# Patient Record
Sex: Male | Born: 1940 | Race: Asian | Hispanic: No | Smoking: Never smoker
Health system: Southern US, Community
[De-identification: ages and names within clinical notes are randomized; demographics above are authoritative.]

## PROBLEM LIST (undated history)

## (undated) DIAGNOSIS — N189 Chronic kidney disease, unspecified: Secondary | ICD-10-CM

## (undated) DIAGNOSIS — D649 Anemia, unspecified: Secondary | ICD-10-CM

## (undated) DIAGNOSIS — G709 Myoneural disorder, unspecified: Secondary | ICD-10-CM

## (undated) DIAGNOSIS — T7840XA Allergy, unspecified, initial encounter: Secondary | ICD-10-CM

## (undated) DIAGNOSIS — K635 Polyp of colon: Secondary | ICD-10-CM

## (undated) DIAGNOSIS — R600 Localized edema: Secondary | ICD-10-CM

## (undated) DIAGNOSIS — R569 Unspecified convulsions: Secondary | ICD-10-CM

## (undated) DIAGNOSIS — K602 Anal fissure, unspecified: Secondary | ICD-10-CM

## (undated) DIAGNOSIS — K649 Unspecified hemorrhoids: Secondary | ICD-10-CM

## (undated) DIAGNOSIS — G4730 Sleep apnea, unspecified: Secondary | ICD-10-CM

## (undated) DIAGNOSIS — R32 Unspecified urinary incontinence: Secondary | ICD-10-CM

## (undated) DIAGNOSIS — K589 Irritable bowel syndrome without diarrhea: Secondary | ICD-10-CM

## (undated) DIAGNOSIS — R079 Chest pain, unspecified: Secondary | ICD-10-CM

## (undated) DIAGNOSIS — G8929 Other chronic pain: Secondary | ICD-10-CM

## (undated) DIAGNOSIS — I10 Essential (primary) hypertension: Secondary | ICD-10-CM

## (undated) DIAGNOSIS — G4733 Obstructive sleep apnea (adult) (pediatric): Secondary | ICD-10-CM

## (undated) DIAGNOSIS — R51 Headache: Secondary | ICD-10-CM

## (undated) DIAGNOSIS — F419 Anxiety disorder, unspecified: Secondary | ICD-10-CM

## (undated) DIAGNOSIS — E785 Hyperlipidemia, unspecified: Secondary | ICD-10-CM

## (undated) DIAGNOSIS — N400 Enlarged prostate without lower urinary tract symptoms: Secondary | ICD-10-CM

## (undated) DIAGNOSIS — E119 Type 2 diabetes mellitus without complications: Secondary | ICD-10-CM

## (undated) DIAGNOSIS — G43909 Migraine, unspecified, not intractable, without status migrainosus: Secondary | ICD-10-CM

## (undated) DIAGNOSIS — H269 Unspecified cataract: Secondary | ICD-10-CM

## (undated) DIAGNOSIS — I4510 Unspecified right bundle-branch block: Secondary | ICD-10-CM

## (undated) DIAGNOSIS — M1990 Unspecified osteoarthritis, unspecified site: Secondary | ICD-10-CM

## (undated) DIAGNOSIS — K219 Gastro-esophageal reflux disease without esophagitis: Secondary | ICD-10-CM

## (undated) HISTORY — PX: HEMORRHOID SURGERY: SHX153

## (undated) HISTORY — DX: Unspecified convulsions: R56.9

## (undated) HISTORY — DX: Polyp of colon: K63.5

## (undated) HISTORY — DX: Allergy, unspecified, initial encounter: T78.40XA

## (undated) HISTORY — DX: Irritable bowel syndrome, unspecified: K58.9

## (undated) HISTORY — DX: Sleep apnea, unspecified: G47.30

## (undated) HISTORY — DX: Anemia, unspecified: D64.9

## (undated) HISTORY — DX: Chronic kidney disease, unspecified: N18.9

## (undated) HISTORY — DX: Benign prostatic hyperplasia without lower urinary tract symptoms: N40.0

## (undated) HISTORY — DX: Anal fissure, unspecified: K60.2

## (undated) HISTORY — PX: CATARACT EXTRACTION: SUR2

## (undated) HISTORY — DX: Unspecified osteoarthritis, unspecified site: M19.90

## (undated) HISTORY — PX: POLYPECTOMY: SHX149

## (undated) HISTORY — DX: Migraine, unspecified, not intractable, without status migrainosus: G43.909

## (undated) HISTORY — DX: Unspecified cataract: H26.9

## (undated) HISTORY — DX: Unspecified hemorrhoids: K64.9

## (undated) HISTORY — DX: Other chronic pain: G89.29

---

## 2001-06-17 ENCOUNTER — Encounter: Payer: Self-pay | Admitting: Cardiology

## 2001-06-17 ENCOUNTER — Ambulatory Visit (HOSPITAL_COMMUNITY): Admission: RE | Admit: 2001-06-17 | Discharge: 2001-06-17 | Payer: Self-pay | Admitting: Cardiology

## 2001-06-27 ENCOUNTER — Encounter: Payer: Self-pay | Admitting: Cardiology

## 2001-06-27 ENCOUNTER — Ambulatory Visit (HOSPITAL_COMMUNITY): Admission: RE | Admit: 2001-06-27 | Discharge: 2001-06-27 | Payer: Self-pay | Admitting: Cardiology

## 2001-06-29 ENCOUNTER — Encounter (INDEPENDENT_AMBULATORY_CARE_PROVIDER_SITE_OTHER): Payer: Self-pay | Admitting: *Deleted

## 2001-06-29 ENCOUNTER — Emergency Department (HOSPITAL_COMMUNITY): Admission: EM | Admit: 2001-06-29 | Discharge: 2001-06-29 | Payer: Self-pay | Admitting: *Deleted

## 2001-08-29 ENCOUNTER — Encounter (INDEPENDENT_AMBULATORY_CARE_PROVIDER_SITE_OTHER): Payer: Self-pay | Admitting: *Deleted

## 2001-08-29 ENCOUNTER — Ambulatory Visit (HOSPITAL_COMMUNITY): Admission: RE | Admit: 2001-08-29 | Discharge: 2001-08-29 | Payer: Self-pay | Admitting: Gastroenterology

## 2002-06-19 ENCOUNTER — Ambulatory Visit (HOSPITAL_COMMUNITY): Admission: RE | Admit: 2002-06-19 | Discharge: 2002-06-19 | Payer: Self-pay | Admitting: Internal Medicine

## 2002-06-19 ENCOUNTER — Encounter: Payer: Self-pay | Admitting: Internal Medicine

## 2002-06-19 ENCOUNTER — Encounter (INDEPENDENT_AMBULATORY_CARE_PROVIDER_SITE_OTHER): Payer: Self-pay | Admitting: *Deleted

## 2004-06-21 ENCOUNTER — Encounter (INDEPENDENT_AMBULATORY_CARE_PROVIDER_SITE_OTHER): Payer: Self-pay | Admitting: *Deleted

## 2004-06-21 ENCOUNTER — Ambulatory Visit (HOSPITAL_COMMUNITY): Admission: RE | Admit: 2004-06-21 | Discharge: 2004-06-21 | Payer: Self-pay | Admitting: Internal Medicine

## 2004-07-22 ENCOUNTER — Ambulatory Visit: Payer: Self-pay | Admitting: Family Medicine

## 2004-08-18 ENCOUNTER — Ambulatory Visit: Payer: Self-pay | Admitting: Family Medicine

## 2004-10-27 ENCOUNTER — Ambulatory Visit: Payer: Self-pay | Admitting: *Deleted

## 2004-11-07 ENCOUNTER — Ambulatory Visit: Payer: Self-pay | Admitting: Family Medicine

## 2005-02-28 ENCOUNTER — Ambulatory Visit: Payer: Self-pay | Admitting: Family Medicine

## 2005-03-07 ENCOUNTER — Ambulatory Visit: Payer: Self-pay | Admitting: Family Medicine

## 2005-03-08 ENCOUNTER — Encounter (INDEPENDENT_AMBULATORY_CARE_PROVIDER_SITE_OTHER): Payer: Self-pay | Admitting: *Deleted

## 2005-03-08 ENCOUNTER — Ambulatory Visit (HOSPITAL_COMMUNITY): Admission: RE | Admit: 2005-03-08 | Discharge: 2005-03-08 | Payer: Self-pay | Admitting: Internal Medicine

## 2005-03-14 ENCOUNTER — Ambulatory Visit: Payer: Self-pay | Admitting: Family Medicine

## 2005-08-03 ENCOUNTER — Ambulatory Visit: Payer: Self-pay | Admitting: Family Medicine

## 2005-08-29 ENCOUNTER — Ambulatory Visit: Payer: Self-pay | Admitting: Family Medicine

## 2005-09-11 ENCOUNTER — Ambulatory Visit: Payer: Self-pay | Admitting: Family Medicine

## 2005-09-18 ENCOUNTER — Ambulatory Visit: Payer: Self-pay | Admitting: Family Medicine

## 2006-01-26 ENCOUNTER — Ambulatory Visit: Payer: Self-pay | Admitting: Family Medicine

## 2006-02-21 ENCOUNTER — Ambulatory Visit: Payer: Self-pay | Admitting: Family Medicine

## 2006-03-13 ENCOUNTER — Ambulatory Visit: Payer: Self-pay | Admitting: Internal Medicine

## 2006-03-13 ENCOUNTER — Encounter: Payer: Self-pay | Admitting: Internal Medicine

## 2006-08-14 ENCOUNTER — Ambulatory Visit: Payer: Self-pay | Admitting: Family Medicine

## 2006-09-24 ENCOUNTER — Ambulatory Visit: Payer: Self-pay | Admitting: Family Medicine

## 2006-10-30 HISTORY — PX: CHOLECYSTECTOMY: SHX55

## 2006-11-20 ENCOUNTER — Ambulatory Visit: Payer: Self-pay | Admitting: Family Medicine

## 2007-04-10 DIAGNOSIS — E1159 Type 2 diabetes mellitus with other circulatory complications: Secondary | ICD-10-CM | POA: Insufficient documentation

## 2007-04-10 DIAGNOSIS — G479 Sleep disorder, unspecified: Secondary | ICD-10-CM | POA: Insufficient documentation

## 2007-04-10 DIAGNOSIS — J302 Other seasonal allergic rhinitis: Secondary | ICD-10-CM | POA: Insufficient documentation

## 2007-04-10 DIAGNOSIS — K2990 Gastroduodenitis, unspecified, without bleeding: Secondary | ICD-10-CM

## 2007-04-10 DIAGNOSIS — E785 Hyperlipidemia, unspecified: Secondary | ICD-10-CM | POA: Insufficient documentation

## 2007-04-10 DIAGNOSIS — I10 Essential (primary) hypertension: Secondary | ICD-10-CM | POA: Insufficient documentation

## 2007-04-10 DIAGNOSIS — T7840XA Allergy, unspecified, initial encounter: Secondary | ICD-10-CM | POA: Insufficient documentation

## 2007-04-10 DIAGNOSIS — K2970 Gastritis, unspecified, without bleeding: Secondary | ICD-10-CM | POA: Insufficient documentation

## 2007-04-10 DIAGNOSIS — K449 Diaphragmatic hernia without obstruction or gangrene: Secondary | ICD-10-CM | POA: Insufficient documentation

## 2007-04-10 DIAGNOSIS — K649 Unspecified hemorrhoids: Secondary | ICD-10-CM | POA: Insufficient documentation

## 2007-04-20 ENCOUNTER — Encounter (INDEPENDENT_AMBULATORY_CARE_PROVIDER_SITE_OTHER): Payer: Self-pay | Admitting: *Deleted

## 2007-04-20 ENCOUNTER — Emergency Department (HOSPITAL_COMMUNITY): Admission: EM | Admit: 2007-04-20 | Discharge: 2007-04-20 | Payer: Self-pay | Admitting: Emergency Medicine

## 2007-05-27 ENCOUNTER — Encounter (INDEPENDENT_AMBULATORY_CARE_PROVIDER_SITE_OTHER): Payer: Self-pay | Admitting: *Deleted

## 2007-05-27 ENCOUNTER — Encounter (INDEPENDENT_AMBULATORY_CARE_PROVIDER_SITE_OTHER): Payer: Self-pay | Admitting: Gastroenterology

## 2007-05-27 ENCOUNTER — Ambulatory Visit (HOSPITAL_COMMUNITY): Admission: RE | Admit: 2007-05-27 | Discharge: 2007-05-27 | Payer: Self-pay | Admitting: Gastroenterology

## 2007-07-18 ENCOUNTER — Encounter (INDEPENDENT_AMBULATORY_CARE_PROVIDER_SITE_OTHER): Payer: Self-pay | Admitting: *Deleted

## 2007-07-18 ENCOUNTER — Ambulatory Visit (HOSPITAL_COMMUNITY): Admission: RE | Admit: 2007-07-18 | Discharge: 2007-07-19 | Payer: Self-pay | Admitting: General Surgery

## 2007-07-19 ENCOUNTER — Encounter (INDEPENDENT_AMBULATORY_CARE_PROVIDER_SITE_OTHER): Payer: Self-pay | Admitting: General Surgery

## 2007-07-19 ENCOUNTER — Encounter (INDEPENDENT_AMBULATORY_CARE_PROVIDER_SITE_OTHER): Payer: Self-pay | Admitting: *Deleted

## 2008-07-01 ENCOUNTER — Emergency Department (HOSPITAL_COMMUNITY): Admission: EM | Admit: 2008-07-01 | Discharge: 2008-07-01 | Payer: Self-pay | Admitting: Emergency Medicine

## 2008-10-06 ENCOUNTER — Emergency Department (HOSPITAL_COMMUNITY): Admission: EM | Admit: 2008-10-06 | Discharge: 2008-10-06 | Payer: Self-pay | Admitting: Emergency Medicine

## 2009-05-27 HISTORY — PX: NM MYOCAR PERF WALL MOTION: HXRAD629

## 2009-05-27 HISTORY — PX: US ECHOCARDIOGRAPHY: HXRAD669

## 2009-05-31 ENCOUNTER — Encounter: Admission: RE | Admit: 2009-05-31 | Discharge: 2009-05-31 | Payer: Self-pay | Admitting: Cardiology

## 2009-06-04 ENCOUNTER — Ambulatory Visit (HOSPITAL_COMMUNITY): Admission: RE | Admit: 2009-06-04 | Discharge: 2009-06-04 | Payer: Self-pay | Admitting: Cardiology

## 2009-06-04 HISTORY — PX: CARDIAC CATHETERIZATION: SHX172

## 2009-06-08 ENCOUNTER — Emergency Department (HOSPITAL_COMMUNITY): Admission: EM | Admit: 2009-06-08 | Discharge: 2009-06-08 | Payer: Self-pay | Admitting: Emergency Medicine

## 2009-08-11 ENCOUNTER — Ambulatory Visit (HOSPITAL_COMMUNITY): Admission: RE | Admit: 2009-08-11 | Discharge: 2009-08-11 | Payer: Self-pay | Admitting: Cardiology

## 2009-11-30 DIAGNOSIS — G4733 Obstructive sleep apnea (adult) (pediatric): Secondary | ICD-10-CM

## 2009-11-30 HISTORY — DX: Obstructive sleep apnea (adult) (pediatric): G47.33

## 2009-12-26 ENCOUNTER — Ambulatory Visit (HOSPITAL_BASED_OUTPATIENT_CLINIC_OR_DEPARTMENT_OTHER): Admission: RE | Admit: 2009-12-26 | Discharge: 2009-12-26 | Payer: Self-pay | Admitting: Specialist

## 2010-01-01 ENCOUNTER — Ambulatory Visit: Payer: Self-pay | Admitting: Internal Medicine

## 2010-01-18 ENCOUNTER — Emergency Department (HOSPITAL_COMMUNITY): Admission: EM | Admit: 2010-01-18 | Discharge: 2010-01-19 | Payer: Self-pay | Admitting: Emergency Medicine

## 2010-03-02 ENCOUNTER — Emergency Department (HOSPITAL_COMMUNITY): Admission: EM | Admit: 2010-03-02 | Discharge: 2010-03-03 | Payer: Self-pay | Admitting: Emergency Medicine

## 2010-03-16 ENCOUNTER — Encounter (INDEPENDENT_AMBULATORY_CARE_PROVIDER_SITE_OTHER): Payer: Self-pay | Admitting: *Deleted

## 2010-03-16 ENCOUNTER — Emergency Department (HOSPITAL_COMMUNITY): Admission: EM | Admit: 2010-03-16 | Discharge: 2010-03-17 | Payer: Self-pay | Admitting: Emergency Medicine

## 2010-04-15 ENCOUNTER — Encounter (INDEPENDENT_AMBULATORY_CARE_PROVIDER_SITE_OTHER): Payer: Self-pay | Admitting: *Deleted

## 2010-05-19 ENCOUNTER — Ambulatory Visit: Payer: Self-pay | Admitting: Internal Medicine

## 2010-05-19 DIAGNOSIS — K589 Irritable bowel syndrome without diarrhea: Secondary | ICD-10-CM | POA: Insufficient documentation

## 2010-05-19 DIAGNOSIS — Z860100 Personal history of colon polyps, unspecified: Secondary | ICD-10-CM | POA: Insufficient documentation

## 2010-05-19 DIAGNOSIS — K581 Irritable bowel syndrome with constipation: Secondary | ICD-10-CM | POA: Insufficient documentation

## 2010-05-19 DIAGNOSIS — K219 Gastro-esophageal reflux disease without esophagitis: Secondary | ICD-10-CM | POA: Insufficient documentation

## 2010-06-20 ENCOUNTER — Ambulatory Visit: Payer: Self-pay | Admitting: Internal Medicine

## 2010-06-21 ENCOUNTER — Telehealth: Payer: Self-pay | Admitting: Internal Medicine

## 2010-07-26 ENCOUNTER — Emergency Department (HOSPITAL_COMMUNITY): Admission: EM | Admit: 2010-07-26 | Discharge: 2010-07-26 | Payer: Self-pay | Admitting: Emergency Medicine

## 2010-10-30 HISTORY — PX: COLONOSCOPY: SHX174

## 2010-11-20 ENCOUNTER — Encounter: Payer: Self-pay | Admitting: Emergency Medicine

## 2010-11-29 NOTE — Letter (Signed)
Summary: EGD Instructions  [facility]  [address] [place], [place] [address]   Phone: [phone]  Fax: [phone]       Omar Sanders    27-Jul-1941    MRN: [id number]       Procedure Day /Date:MONDAY, 06/20/10     Arrival Time: 3:00 PM     Procedure Time:4:00 PM     Location of Procedure:                    [facility] (4th Floor)   PREPARATION FOR ENDOSCOPY   On MONDAY, 8/22 THE DAY OF THE PROCEDURE:  1.   No solid foods, milk or milk products are allowed after midnight the night before your procedure.  2.   Do not drink anything colored red or purple.  Avoid juices with pulp.  No orange juice.  3.  You may drink clear liquids until 2:00 PM, which is 2 hours before your procedure.                                                                                                CLEAR LIQUIDS INCLUDE: Water Jello Ice Popsicles Tea (sugar ok, no milk/cream) Powdered fruit flavored drinks Coffee (sugar ok, no milk/cream) Gatorade Juice: apple, white grape, white cranberry  Lemonade Clear bullion, consomm, broth Carbonated beverages (any kind) Strained chicken noodle soup Hard Candy   MEDICATION INSTRUCTIONS  Unless otherwise instructed, you should take regular prescription medications with a small sip of water as early as possible the morning of your procedure.             OTHER INSTRUCTIONS  You will need a responsible adult at least 72 years of age to accompany you and drive you home.   This person must remain in the waiting room during your procedure.  Wear loose fitting clothing that is easily removed.  Leave jewelry and other valuables at home.  However, you may wish to bring a book to read or an iPod/MP3 player to listen to music as you wait for your procedure to start.  Remove all body piercing jewelry and leave at home.  Total time from sign-in until discharge is approximately 2-3 hours.  You should go home  directly after your procedure and rest.  You can resume normal activities the day after your procedure.  The day of your procedure you should not:   Drive   Make legal decisions   Operate machinery   Drink alcohol   Return to work  You will receive specific instructions about eating, activities and medications before you leave.    The above instructions have been reviewed and explained to me by   _______________________    I fully understand and can verbalize these instructions _____________________________ Date _________

## 2010-11-29 NOTE — Procedures (Signed)
Summary: Upper Endoscopy  Patient: Omar Sanders Note: All result statuses are Final unless otherwise noted.  Tests: (1) Upper Endoscopy (EGD)   EGD Upper Endoscopy       DONE     [facility]     [address]     [place], [place]  [address]           ENDOSCOPY PROCEDURE REPORT           PATIENT:  Omar, Sanders  MR#:  [id number]     BIRTHDATE:  1941/06/21, 69 yrs. old  GENDER:  male           ENDOSCOPIST:  Wilhemina Bonito. Eda Keys, MD     Referred by:  Office (ER)           PROCEDURE DATE:  06/20/2010     PROCEDURE:  EGD, diagnostic     ASA CLASS:  Class II     INDICATIONS:  epigastric pain           MEDICATIONS:   Fentanyl 50 mcg IV, Versed 5 mg IV     TOPICAL ANESTHETIC:  Exactacain Spray           DESCRIPTION OF PROCEDURE:   After the risks benefits and     alternatives of the procedure were thoroughly explained, informed     consent was obtained.  The [facility] GIF-H180 [id number] endoscope was     introduced through the mouth and advanced to the second portion of     the duodenum, without limitations.  The instrument was slowly     withdrawn as the mucosa was fully examined.     <<PROCEDUREIMAGES>>           The upper, middle, and distal third of the esophagus were     carefully inspected and no abnormalities were noted. The z-line     was well seen at the GEJ. The endoscope was pushed into the fundus     which was normal including a retroflexed view. The antrum,gastric     body, first and second part of the duodenum were unremarkable.     Retroflexed views revealed no abnormalities.    The scope was then     withdrawn from the patient and the procedure completed.           COMPLICATIONS:  None           ENDOSCOPIC IMPRESSION:     1) Normal EGD     2) Suspect symptoms are functional and related to anxiety           RECOMMENDATIONS:     1) RESUME YOUR CARE WITH DR DWIGHT Mayford Knife           _____________________________     Wilhemina Bonito. Eda Keys, MD           CC:  Lerry Liner, MD, The Patient           n.     eSIGNED:   Wilhemina Bonito. Eda Keys at 06/20/2010 04:23 PM           Omar Sanders, [id number]  Note: An exclamation mark (!) indicates a result that was not dispersed into the flowsheet. Document Creation Date: 06/20/2010 4:25 PM _______________________________________________________________________  (1) Order result status: Final Collection or observation date-time: 06/20/2010 16:15 Requested date-time:  Receipt date-time:  Reported date-time:  Referring Physician:   Ordering Physician: Fransico Setters [phone]) Specimen Source:  Source: Launa Grill Order Number: [id number]  Lab site:

## 2010-11-29 NOTE — Op Note (Signed)
Summary: Operative Report

## 2010-11-29 NOTE — Op Note (Signed)
Summary: Colonoscopy-Dr. Juliette Alcide [facility]  Patient:    JOHNSON, ARIZOLA Visit Number: [id number] MRN: [id number]          Service Type: END Location: ENDO Attending Physician:  Charna Elizabeth Dictated by:   Anselmo Rod, M.D. Proc. Date: 08/29/01 Admit Date:  08/29/2001   CC:         Mohan N. Sharyn Lull, M.D.                           Procedure Report  DATE OF BIRTH:  [birth year].  PROCEDURE:  Colonoscopy.  ENDOSCOPIST:  Anselmo Rod, M.D.  INSTRUMENT USED:  Pediatric Olympus colonoscope.  INDICATION FOR PROCEDURE:  Rectal bleeding in a 70 year old [place] male. Rule out colonic polyps, masses, hemorrhoids, etc.  PREPROCEDURE PREPARATION:  Informed consent was procured from the patient. The patient was fasted for eight hours prior to the procedure and prepped with a bottle of magnesium citrate and a gallon of NuLytely the night prior to the procedure.  PREPROCEDURE PHYSICAL:  VITAL SIGNS:  The patient had stable vital signs.  NECK:  Supple.  CHEST:  Clear to auscultation.  S1, S2 regular.  ABDOMEN:  Soft with normal bowel sounds.  DESCRIPTION OF PROCEDURE:  The patient was placed in the left lateral decubitus position and sedated with 60 mg of Demerol and 6 mg of Versed intravenously.  Once the patient was adequately sedate and maintained on low-flow oxygen and continuous cardiac monitoring, the Olympus video colonoscope was advanced from the rectum to the cecum without difficulty. Except for small internal hemorrhoids, no masses, polyps, erosions, ulcerations, or diverticula were seen.  IMPRESSION:  Healthy-appearing colon except for small internal hemorrhoid.  RECOMMENDATIONS: 1. A high-fiber diet has been advised. 2. Outpatient follow-up is recommended within the next four weeks. Dictated by:   Anselmo Rod, M.D. Attending Physician:  Charna Elizabeth DD:  08/29/01 TD:  08/30/01 Job: [id number]

## 2010-11-29 NOTE — Assessment & Plan Note (Signed)
Summary: Abdominal pain   History of Present Illness Visit Type: new patient Primary GI MD: Yancey Flemings MD Primary Provider: Lerry Liner M.D. Requesting Provider: [facility] Chief Complaint: pt c/o liquid dysphagia-water only, abdominal pain that begins midline, but will radiate to entire abdomen History of Present Illness:   70 year old [place] refugee with a history of hypertension, dyslipidemia, and GERD. He presents today at the request of the emergency room regarding chronic abdominal complaints. He is accompanied by his niece. Patient has a greater than 10 year history of GI complaints. His GI review of systems is essentially positive. Complaints include generalized abdominal pain, bloating, alternating bowel habits with a tendency toward diarrhea, reflux, dysphagia, loss of appetite, and weight loss. He has undergone multiple evaluations including numerous imaging studies. He has also undergone several colonoscopies and an upper endoscopy with another gastroenterologist, Dr. Loreta Ave. His not GI review of systems is entirely positive as well. He was seen in this office in 2007 with chronic complaints. 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 Since that time he has undergone cholecystectomy for gallstones and another colonoscopy with Dr. Loreta Ave and 2008 with polyps being found. These were adenomatous. Routine followup is planned with Dr. Loreta Ave per her recommendation upper endoscopy at that same time was remarkable for mild gastritis only. He has Nexium and ranitidine on his medication list. Is not clear that he is after taking Nexium however. His most recent history dates back to May 2011 when he was evaluated on the fifth day for epigastric and chest pain. He also complained of a headache, neck pain, and numbness of arms and legs. Workup included multiple laboratories which were normal, chest x-ray without acute abnormality. Negative urinalysis and cardiac markers. A negative head CT.  On the 18th day of the month he was again seen complaining of abdominal pain. Extensive workup included negative urinalysis is unremarkable laboratories as well as negative CT scan of the abdomen and pelvis. He was referred to this office. Weight in 2007 was 166 and today 144 pounds. Language barrier present.   GI Review of Systems    Reports abdominal pain, acid reflux, bloating, chest pain, dysphagia with liquids, loss of appetite, nausea, and  weight loss.     Location of  Abdominal pain: generalized.    Denies belching, dysphagia with solids, heartburn, vomiting, vomiting blood, and  weight gain.      Reports black tarry stools, change in bowel habits, and  diarrhea.     Denies anal fissure, constipation, diverticulosis, fecal incontinence, heme positive stool, hemorrhoids, irritable bowel syndrome, jaundice, light color stool, liver problems, rectal bleeding, and  rectal pain. Preventive Screening-Counseling & Management      Drug Use:  no.      Current Medications (verified): 1)  Xyzal  Tabs (Levocetirizine Dihydrochloride Tabs) .Marland Kitchen.. 1 By Mouth Once Daily 2)  Lisinopril 5 Mg Tabs (Lisinopril) .... Take 1 Tablet By Mouth Once A Day 3)  Nexium 40 Mg Cpdr (Esomeprazole Magnesium) .Marland Kitchen.. 1 By Mouth Once Daily 4)  Zetia 10 Mg Tabs (Ezetimibe) .Marland Kitchen.. 1 By Mouth Once Daily 5)  Ranitidine Hcl 300 Mg Caps (Ranitidine Hcl) .... Take 1 Tablet By Mouth Once A Day 6)  Isosorbide Mononitrate 20 Mg Tabs (Isosorbide Mononitrate) .... Take 1 Tablet By Mouth Once A Day 7)  Triazolam 0.25 Mg Tabs (Triazolam) .... Take 1 Tab By Mouth At Bedtime 8)  Trazodone Hcl 50 Mg Tabs (Trazodone Hcl) .... Take 1 Tab By Mouth  At Bedtime 9)  Seroquel Xr 150 Mg Xr24h-Tab (Quetiapine Fumarate) .... Take 1 Tab By Mouth At Bedtime 10)  Lomotil 2.5-0.025 Mg Tabs (Diphenoxylate-Atropine) .... Take 1 Tablet By Mouth As Needed For Diarrhea  Allergies (verified): 1)  ! Aspirin 2)  ! Quinine  Past History:  Past Medical  History: Reviewed history from 05/16/2010 and no changes required. Hyperlipidemia Hypertension History of H-Pylori GERD Hemorrhoids  Past Surgical History: Cholecystectomy  Family History: Reviewed history from 05/16/2010 and no changes required. No FH of Colon Cancer:  Social History: Patient has never smoked.  Alcohol Use - no widowed, 2 boys, 2 girls Illicit Drug Use - no Drug Use:  no  Review of Systems       The patient complains of back pain, fatigue, headaches-new, and urination changes/pain.  The patient denies allergy/sinus, anemia, anxiety-new, arthritis/joint pain, blood in urine, breast changes/lumps, confusion, cough, coughing up blood, depression-new, fainting, fever, hearing problems, heart murmur, heart rhythm changes, itching, muscle pains/cramps, night sweats, nosebleeds, shortness of breath, skin rash, sleeping problems, sore throat, swelling of feet/legs, swollen lymph glands, thirst - excessive, urination - excessive, urine leakage, vision changes, voice change, change in vision, menstrual pain, pregnancy symptoms, thirst - excessive , and urination - excessive .         also, pain involving the head shoulders arms hips and legs  Vital Signs:  Patient profile:   70 year old male Height:      67 inches Weight:      144 pounds BMI:     22.64 Pulse rate:   80 / minute Pulse rhythm:   regular BP sitting:   90 / 58  (left arm) Cuff size:   regular  Vitals Entered By: Francee Piccolo CMA Duncan Dull) (May 19, 2010 9:35 AM)  Physical Exam  General:  Well developed, well nourished, no acute distress. Head:  Normocephalic and atraumatic. Eyes:  PERRLA, no icterus. Ears:  Normal auditory acuity. Nose:  No deformity, discharge,  or lesions. Mouth:  No deformity or lesions. Neck:  Supple; no masses or thyromegaly. Lungs:  Clear throughout to auscultation. Heart:  Regular rate and rhythm; no murmurs, rubs,  or bruits. Abdomen:  Soft, nontender and  nondistended. No masses, hepatosplenomegaly or hernias noted. Normal bowel sounds. Msk:  Symmetrical with no gross deformities. Normal posture. Pulses:  Normal pulses noted. Extremities:  No clubbing, cyanosis, edema or deformities noted. Neurologic:  Alert and  oriented x4;  grossly normal neurologically. Skin:  Intact without significant lesions or rashes. Psych:  Alert and cooperative. Normal mood and affect.   Impression & Recommendations:  Problem # 1:  ABDOMINAL PAIN -GENERALIZED (ICD-10.36) 71 year old with chronic abdominal complaints who presents today with chronic abdominal complaints. I believe that he has some component of GERD and irritable bowel. I suspect his biggest problem is functional dominant pain. He may have a significant element of anxiety and depression, which despite treatment, is ongoing. Given complaints of ongoing pain, decreased appetite, weight loss, and some dysphasia we will set him up for an upper endoscopy. In the room I have given him samples of Nexium. If he endoscopy is unremarkable and Nexium not helpful, that he needs to return to his primary provider for further evaluation and treatment of anxiety and depression.  Problem # 2:  PERSONAL HX COLONIC POLYPS (ICD-V12.72) prior colonoscopies with Dr. Loreta Ave, most recent 2008 with adenomas. Patient will continue his routine surveillance colonoscopy follow up with Dr. Loreta Ave.  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 PPI compliance important to help ascertain  Other Orders: EGD (EGD)  Patient Instructions: 1)  EGD [facility] 06/20/10 4:00 pm arrive at3:00 pm 2)  Upper Endoscopy brochure given.  3)  Nexium samples given to patient to take 1 by mouth once daily 4)  The medication list was reviewed and reconciled.  All changed / newly prescribed medications were explained.  A complete medication list was  provided to the patient / caregiver. 5)  Copy: Dr. Lerry Liner, Dr. Charna Elizabeth

## 2010-11-29 NOTE — Progress Notes (Signed)
Summary: triage  Phone Note Call from Patient Call back at [phone]   Caller: Patient Call For: Dr Marina Goodell Reason for Call: Talk to Nurse Summary of Call: Patient states that he has severe abd pain and burning in his chest wants to know what to do. Patient had a procedure yesterday. Initial call taken by: Tawni Levy,  June 21, 2010 11:35 AM  Follow-up for Phone Call        Per Dr. Marina Goodell tell pt he should follow up with his primary md.  The  egd done yesterday was normal and he did not think the pain was coming from the procedure yesterday.  Message given to the pt.  He said "okay". Follow-up by: Darlyn Read RN,  June 21, 2010 2:02 PM

## 2010-11-29 NOTE — Op Note (Signed)
Summary: Operative Report  NAME:  Omar Sanders, Omar Sanders                  ACCOUNT NO.:  [id number]      MEDICAL RECORD NO.:  [id number]          PATIENT TYPE:  OIB      LOCATION:  5731                         FACILITY:  [facility]      PHYSICIAN:  Cherylynn Ridges, M.D.    DATE OF BIRTH:  Mar 13, 1941      DATE OF PROCEDURE:  07/18/2007   DATE OF DISCHARGE:                                  OPERATIVE REPORT      PREOPERATIVE DIAGNOSIS:  Symptomatic cholelithiasis.      POSTOPERATIVE DIAGNOSIS:  Symptomatic cholelithiasis, with significant   hepatic adhesions to the anterior abdominal wall.      PROCEDURES:   1. Laparoscopic cholecystectomy.   2. Cholangiogram.   3. Laparoscopic enterolysis.      SURGEON:  Cherylynn Ridges, M.D.      ASSISTANT:  Wilmon Arms. Tsuei, M.D.      ANESTHESIA:  General endotracheal.      ESTIMATED BLOOD LOSS:  Less than 20 cc.      COMPLICATIONS:  None.      CONDITION:  Stable.      FINDINGS:  The patient had significant adhesions of the entire right   lobe of the liver to the anterior abdominal wall.  The cause of this is   unknown.  He had evidence of chronic cholecystitis.  The cholangiogram   was normal.      INDICATIONS FOR OPERATION:  The patient is a 70 year old Asian gentleman   with abdominal pain in the right upper quadrant with gallstones who now   comes in for an elective laparoscopic cholecystectomy.      OPERATION:  The patient was taken to the operating room and placed on   the table in the supine position.  After an adequate general anesthetic   was administered, he was prepped and draped in the usual sterile manner,   exposing the midline of the abdomen.      A supraumbilical curvilinear incision was made using a #11 blade and   taken down to the midline fascia.  There was a small fascial defect   which was enlarged with the surgeon's finger that we used for our   entrance sites.  This was grasped at the fascial level with Kocher   clamps, and then  we bluntly dissected down into the peritoneal cavity   using a Kelly clamp.  The patient was placed in reverse Trendelenburg.   Once we were in the peritoneal cavity, we passed a pursestring suture of   0 Vicryl around the fascial opening, then a Hasson cannula into the   peritoneal cavity.  Once the Hasson cannula was in place, we insufflated   carbon dioxide gas up to maximal intraabdominal pressure of 15 mmHg.  We   placed two right-sided 5 mm cannulas and a subxiphoid 11/12 mm cannula   under direct vision.  With all cannulas in place, the dissection was   begun.  We started out by lysing the adhesions between the liver and  anterior abdominal wall with laparoscopic scissors.  This was done   uneventfully.  This gave [place] a little bit more flexibility in terms of   retracting the liver and the gallbladder towards the anterior abdominal   wall and the right upper quadrant.  We then took down some adhesions to   the gallbladder dome itself as we retracted it towards the anterior   abdominal wall and the right upper quadrant.  A second grasper was   passed onto the infundibulum.  We then dissected out the peritoneum   overlying the triangle of Calot and hepatoduodenal triangle.  This   allowed [place] to isolate and expose the cystic duct, which was about 6 mm   in size.  Once we had a clear window around the cystic duct, a cyst duct   clip was placed on the gallbladder side, a cholecystodochotomy made   using laparoscopic scissors, and then a cholangiogram performed using a   Cook catheter which had been passed through the anterior abdominal wall.   This demonstrated good flow into the duodenum, no filling defects, no   dilatation, and good proximal flow.      Once the cholangiogram was completed, we placed two distal clips along   the distal cystic duct after the catheter and securing clip and then   removed.  We then transected the cystic duct.  We dissected out the   cystic artery and  placed double clips on the remaining side and   transected along the gallbladder side.  We dissect out the gallbladder   from its bed with minimal difficulty, retrieving it from the   supraumbilical site using an EndoCatch bag.      Once this was done, we irrigated the bed and the right upper quadrant   with saline solution.  About a liter of solution was used.  Hemostasis   was obtained in the hepatic bed using electrocautery.  Once this was   done, we removed all cannulas and aspirated all fluid and gas from above   the liver.      The supraumbilical fascial site was closed using a pursestring suture   which was in place.  We injected 0.25% Marcaine with epinephrine at all   sites.  Once all cannulas were removed, we closed the supraumbilical and   subxiphoid sites with running subcuticular stitch of 4-0 Vicryl.  Once   that was done, the lateral cannula sites were closed with Dermabond.  We   placed Dermabond on the skin and Steri-Strips and Tegaderm dressings.   All needle counts, sponge counts, and instrument counts were correct.               Cherylynn Ridges, M.D.   Electronically Signed            JOW/MEDQ  D:  07/18/2007  T:  07/19/2007  Job:  [id number]

## 2010-11-29 NOTE — Consult Note (Signed)
Summary: [facility]   Imported By: Sherian Rein 05/20/2010 13:08:33  _____________________________________________________________________  External Attachment:    Type:   Image     Comment:   External Document

## 2010-11-29 NOTE — Op Note (Signed)
Summary: EGD-Dr. Loreta Ave  Omar Sanders, Omar Sanders                  ACCOUNT NO.:  [id number]      MEDICAL RECORD NO.:  [id number]          PATIENT TYPE:  AMB      LOCATION:  ENDO                         FACILITY:  [facility]      PHYSICIAN:  Anselmo Rod, M.D.  DATE OF BIRTH:  Nov 11, 1940      DATE OF PROCEDURE:  05/27/2007   DATE OF DISCHARGE:                                  OPERATIVE REPORT      PROCEDURE PERFORMED:  Esophagogastroduodenoscopy.      ENDOSCOPIST:  Dr. Anselmo Rod.      INSTRUMENT USED:  Pentax video panendoscope.      INDICATIONS FOR PROCEDURE:  A 70 year old, Asian male with a history of   guaiac-positive stools and mild anemia undergoing EGD prior to a   cholecystectomy to rule out peptic ulcer disease, esophagitis,   gastritis, etc.      PREPROCEDURE PREPARATION:  Informed consent was procured from the   patient.  The patient fasted for 8 hours prior to the procedure. The   risks and benefits of the procedure were discussed with the patient in   detail.      PREPROCEDURE PHYSICAL:  The patient had stable vital signs.  Neck   supple.  Chest clear to auscultation.  S1, S2 regular.  Abdomen soft   with normal bowel sounds.      DESCRIPTION OF PROCEDURE:  The patient was placed in the lateral   decubitus position and sedated with 50 mcg of Fentanyl and 5 mg of   Versed given intravenously in slow incremental doses. Once the patient   was adequately sedated and maintained on low-flow oxygen and continuous   cardiac monitoring, the Pentax video panendoscope was advanced through   the mouthpiece over the tongue into the esophagus under direct vision.   The entire esophagus was widely patent with no evidence of ring,   stricture, mass, esophagitis or Barrett's mucosa.  The Z-line appeared   healthy.  The scope was then advanced into the stomach.  Mild antral   gastritis was appreciated.  Retroflexion of the high cardia revealed no   abnormalities. No ulcers, erosions,  masses or polyps were seen.  The   proximal small bowel appeared normal. There was no outlet obstruction.      IMPRESSION:  Normal EGD except for mild antral gastritis.  No ulcers,   erosions, masses or polyps seen.      RECOMMENDATIONS:   1. Proceed with a colonoscopy at this time.   2. Further recommendations to be made thereafter.               Anselmo Rod, M.D.   Electronically Signed            JNM/MEDQ  D:  05/27/2007  T:  05/27/2007  Job:  [id number]      cc:   Cherylynn Ridges, M.D.   [address]   [place]   [place] [address]      Dineen Kid. Reche Dixon, M.D.  Fax: [phone]

## 2010-11-29 NOTE — Letter (Signed)
Summary: New Patient letter  [facility]  [address] [place], [place] [address]   Phone: [phone]  Fax: [phone]       04/15/2010 MRN: [id number]  Omar Sanders [address] [place], [place]  [address]  Dear Mr. Heying,  Welcome to the Gastroenterology Division at [facility].    You are scheduled to see Dr.  Marina Goodell on 05-17-10 at 2:45p.m. on the 3rd floor at [facility], [address]  We ask that you try to arrive at our office 15 minutes prior to your appointment time to allow for check-in.  We would like you to complete the enclosed self-administered evaluation form prior to your visit and bring it with you on the day of your appointment.  We will review it with you.  Also, please bring a complete list of all your medications or, if you prefer, bring the medication bottles and we will list them.  Please bring your insurance card so that we may make a copy of it.  If your insurance requires a referral to see a specialist, please bring your referral form from your primary care physician.  Co-payments are due at the time of your visit and may be paid by cash, check or credit card.     Your office visit will consist of a consult with your physician (includes a physical exam), any laboratory testing he/she may order, scheduling of any necessary diagnostic testing (e.g. x-ray, ultrasound, CT-scan), and scheduling of a procedure (e.g. Endoscopy, Colonoscopy) if required.  Please allow enough time on your schedule to allow for any/all of these possibilities.    If you cannot keep your appointment, please call [phone] to cancel or reschedule prior to your appointment date.  This allows [place] the opportunity to schedule an appointment for another patient in need of care.  If you do not cancel or reschedule by 5 p.m. the business day prior to your appointment date, you will be charged a $50.00 late cancellation/no-show fee.    Thank you for choosing  [facility] for your medical needs.  We appreciate the opportunity to care for you.  Please visit [place] at our website  to learn more about our practice.                     Sincerely,                                                             The Gastroenterology Division

## 2010-11-29 NOTE — Op Note (Signed)
Summary: colonoscopy-Dr. Loreta Ave  Omar Sanders, Omar Sanders                  ACCOUNT NO.:  [id number]      MEDICAL RECORD NO.:  [id number]          PATIENT TYPE:  AMB      LOCATION:  ENDO                         FACILITY:  [facility]      PHYSICIAN:  Anselmo Rod, M.D.  DATE OF BIRTH:  1941/05/24      DATE OF PROCEDURE:  05/27/2007   DATE OF DISCHARGE:                                  OPERATIVE REPORT      PROCEDURE PERFORMED:  Colonoscopy with snare polypectomy x 3.      ENDOSCOPIST:  Anselmo Rod, M.D.      INSTRUMENT USED:  Pentax video colonoscope.      INDICATIONS FOR PROCEDURE:  A 70 year old Asian male undergoing   colonoscopy for guaiac-positive stools and mild anemia. This is being   done preoperatively before cholecystectomy for gallstones.      PREPROCEDURE PREPARATION:  Informed consent was procured from the   patient. The patient fasted for 8 hours prior to the procedure and   prepped with a bottle of magnesium citrate and a gallon of NuLYTELY the   night prior to the procedure.  Risks and benefits of the procedure   including a 10% miss rate of cancer and polyp were discussed with the   family.      PREPROCEDURE PHYSICAL:  VITAL SIGNS:  The patient had stable vital   signs.   NECK:  Supple.   CHEST:  Clear to auscultation.   HEART:  S1 and S2 regular.   ABDOMEN:  Soft with normal bowel sounds.      DESCRIPTION OF PROCEDURE:  The patient was placed in the left lateral   decubitus position and sedated with an additional 25 mcg of Fentanyl and   2.5 mg of Versed given intravenously in slow incremental doses. Once the   patient was adequately sedated and maintained on low-flow oxygen and   continuous cardiac monitoring, the Pentax video colonoscope was advanced   from the rectum to the cecum.  There was some residual stool in the   cecum and the transverse colon.  Multiple washes were done.  The   patient's position was changed from the left lateral to the supine   position.  Multiple washes were done.  The appendiceal orifice and   ileocecal valve were clearly visualized and photographed. The terminal   ileum appeared healthy and without lesions.  Three small sessile polyps   were removed by cold snare from the right colon. One of them was removed   from the mid right colon, two from the distal right colon. There was no   evidence of diverticulosis. Retroflexion in the rectum revealed no   abnormalities.  The colonic mucosa appeared healthy.  The patient   tolerated the procedure well without complications.  Very small lesions   could be missed under the stool and debris in the colon.      IMPRESSION:   1. Three small sessile polyps removed from the right colon, otherwise  normal exam up to the terminal ileum.   2. Some residual stool in the colon.  Small lesions could be missed.      RECOMMENDATIONS:   1. Await pathology results.   2. Avoid all nonsteroidals including aspirin for the next two weeks.   3. Proceed with the cholecystectomy as planned by Dr. Jimmye Norman in       the near future.   4. Outpatient follow-up as need arises in the future.  Repeat       colonoscopy will be planned depending on the pathology results.               Anselmo Rod, M.D.   Electronically Signed            JNM/MEDQ  D:  05/27/2007  T:  05/27/2007  Job:  [id number]      cc:   Cherylynn Ridges, M.D.   [address]   [place]   [place] [address]      Dineen Kid. Reche Dixon, M.D.   Fax: [phone]

## 2010-12-06 ENCOUNTER — Ambulatory Visit
Admission: RE | Admit: 2010-12-06 | Discharge: 2010-12-06 | Disposition: A | Discharge status: Home / Self Care | Payer: Medicare Other | Source: Ambulatory Visit | Attending: Specialist | Admitting: Specialist

## 2010-12-06 ENCOUNTER — Other Ambulatory Visit: Payer: Self-pay | Admitting: Specialist

## 2010-12-06 DIAGNOSIS — M25552 Pain in left hip: Secondary | ICD-10-CM

## 2011-01-12 LAB — URINALYSIS, ROUTINE W REFLEX MICROSCOPIC
Bilirubin Urine: NEGATIVE
Glucose, UA: NEGATIVE mg/dL
Hgb urine dipstick: NEGATIVE
Ketones, ur: NEGATIVE mg/dL
Nitrite: NEGATIVE
Protein, ur: NEGATIVE mg/dL
Specific Gravity, Urine: 1.007 (ref 1.005–1.030)
Urobilinogen, UA: 0.2 mg/dL (ref 0.0–1.0)
pH: 5.5 (ref 5.0–8.0)

## 2011-01-12 LAB — URINE CULTURE
Colony Count: NO GROWTH
Culture  Setup Time: 201109271030
Culture: NO GROWTH

## 2011-01-16 LAB — COMPREHENSIVE METABOLIC PANEL
ALT: 17 U/L (ref 0–53)
AST: 26 U/L (ref 0–37)
Albumin: 4.1 g/dL (ref 3.5–5.2)
Alkaline Phosphatase: 42 U/L (ref 39–117)
BUN: 20 mg/dL (ref 6–23)
CO2: 23 mEq/L (ref 19–32)
Calcium: 9.3 mg/dL (ref 8.4–10.5)
Chloride: 99 mEq/L (ref 96–112)
Creatinine, Ser: 1.29 mg/dL (ref 0.4–1.5)
GFR calc Af Amer: >60 mL/min (ref >60)
GFR calc non Af Amer: 55 mL/min — ABNORMAL LOW (ref >60)
Glucose, Bld: 89 mg/dL (ref 70–99)
Potassium: 4.2 mEq/L (ref 3.5–5.1)
Sodium: 130 mEq/L — ABNORMAL LOW (ref 135–145)
Total Bilirubin: 0.8 mg/dL (ref 0.3–1.2)
Total Protein: 7.7 g/dL (ref 6.0–8.3)

## 2011-01-16 LAB — DIFFERENTIAL
Basophils Absolute: 0 10*3/uL (ref 0.0–0.1)
Basophils Relative: 1 % (ref 0–1)
Eosinophils Absolute: 0.1 10*3/uL (ref 0.0–0.7)
Eosinophils Relative: 1 % (ref 0–5)
Lymphocytes Relative: 32 % (ref 12–46)
Lymphs Abs: 1.7 10*3/uL (ref 0.7–4.0)
Monocytes Absolute: 0.5 10*3/uL (ref 0.1–1.0)
Monocytes Relative: 10 % (ref 3–12)
Neutro Abs: 3 10*3/uL (ref 1.7–7.7)
Neutrophils Relative %: 57 % (ref 43–77)

## 2011-01-16 LAB — URINALYSIS, ROUTINE W REFLEX MICROSCOPIC
Bilirubin Urine: NEGATIVE
Glucose, UA: NEGATIVE mg/dL
Hgb urine dipstick: NEGATIVE
Ketones, ur: NEGATIVE mg/dL
Nitrite: NEGATIVE
Protein, ur: NEGATIVE mg/dL
Specific Gravity, Urine: 1.006 (ref 1.005–1.030)
Urobilinogen, UA: 0.2 mg/dL (ref 0.0–1.0)
pH: 6 (ref 5.0–8.0)

## 2011-01-16 LAB — LIPASE, BLOOD: Lipase: 41 U/L (ref 11–59)

## 2011-01-16 LAB — POCT I-STAT, CHEM 8
BUN: 23 mg/dL (ref 6–23)
Calcium, Ion: 1.06 mmol/L — ABNORMAL LOW (ref 1.12–1.32)
Chloride: 102 mEq/L (ref 96–112)
Creatinine, Ser: 1.6 mg/dL — ABNORMAL HIGH (ref 0.4–1.5)
Glucose, Bld: 92 mg/dL (ref 70–99)
HCT: 34 % — ABNORMAL LOW (ref 39.0–52.0)
Hemoglobin: 11.6 g/dL — ABNORMAL LOW (ref 13.0–17.0)
Potassium: 4.3 mEq/L (ref 3.5–5.1)
Sodium: 131 mEq/L — ABNORMAL LOW (ref 135–145)
TCO2: 20 mmol/L (ref 0–100)

## 2011-01-16 LAB — CBC
HCT: 33.4 % — ABNORMAL LOW (ref 39.0–52.0)
Hemoglobin: 11.3 g/dL — ABNORMAL LOW (ref 13.0–17.0)
MCHC: 33.8 g/dL (ref 30.0–36.0)
MCV: 82.2 fL (ref 78.0–100.0)
Platelets: 175 10*3/uL (ref 150–400)
RBC: 4.06 MIL/uL — ABNORMAL LOW (ref 4.22–5.81)
RDW: 14.2 % (ref 11.5–15.5)
WBC: 5.3 10*3/uL (ref 4.0–10.5)

## 2011-01-16 LAB — HEMOCCULT GUIAC POC 1CARD (OFFICE): Fecal Occult Bld: NEGATIVE

## 2011-01-16 LAB — POCT CARDIAC MARKERS
CKMB, poc: 3.8 ng/mL (ref 1.0–8.0)
Myoglobin, poc: 99.7 ng/mL (ref 12–200)
Troponin i, poc: <0.05 ng/mL (ref 0.00–0.09)

## 2011-01-17 LAB — COMPREHENSIVE METABOLIC PANEL
ALT: 17 U/L (ref 0–53)
AST: 13 U/L (ref 0–37)
Albumin: 4 g/dL (ref 3.5–5.2)
Alkaline Phosphatase: 41 U/L (ref 39–117)
BUN: 21 mg/dL (ref 6–23)
CO2: 28 mEq/L (ref 19–32)
Calcium: 9.7 mg/dL (ref 8.4–10.5)
Chloride: 102 mEq/L (ref 96–112)
Creatinine, Ser: 1.45 mg/dL (ref 0.4–1.5)
GFR calc Af Amer: 58 mL/min — ABNORMAL LOW (ref >60)
GFR calc non Af Amer: 48 mL/min — ABNORMAL LOW (ref >60)
Glucose, Bld: 103 mg/dL — ABNORMAL HIGH (ref 70–99)
Potassium: 4.4 mEq/L (ref 3.5–5.1)
Sodium: 136 mEq/L (ref 135–145)
Total Bilirubin: 0.6 mg/dL (ref 0.3–1.2)
Total Protein: 7.5 g/dL (ref 6.0–8.3)

## 2011-01-17 LAB — DIFFERENTIAL
Basophils Absolute: 0 10*3/uL (ref 0.0–0.1)
Basophils Relative: 1 % (ref 0–1)
Eosinophils Absolute: 0.1 10*3/uL (ref 0.0–0.7)
Eosinophils Relative: 2 % (ref 0–5)
Lymphocytes Relative: 38 % (ref 12–46)
Lymphs Abs: 2.1 10*3/uL (ref 0.7–4.0)
Monocytes Absolute: 0.5 10*3/uL (ref 0.1–1.0)
Monocytes Relative: 8 % (ref 3–12)
Neutro Abs: 2.8 10*3/uL (ref 1.7–7.7)
Neutrophils Relative %: 51 % (ref 43–77)

## 2011-01-17 LAB — URINALYSIS, ROUTINE W REFLEX MICROSCOPIC
Bilirubin Urine: NEGATIVE
Glucose, UA: NEGATIVE mg/dL
Hgb urine dipstick: NEGATIVE
Ketones, ur: NEGATIVE mg/dL
Nitrite: NEGATIVE
Protein, ur: NEGATIVE mg/dL
Specific Gravity, Urine: 1.006 (ref 1.005–1.030)
Urobilinogen, UA: 0.2 mg/dL (ref 0.0–1.0)
pH: 5.5 (ref 5.0–8.0)

## 2011-01-17 LAB — CBC
HCT: 32.7 % — ABNORMAL LOW (ref 39.0–52.0)
Hemoglobin: 10.9 g/dL — ABNORMAL LOW (ref 13.0–17.0)
MCHC: 33.2 g/dL (ref 30.0–36.0)
MCV: 81.9 fL (ref 78.0–100.0)
Platelets: 183 10*3/uL (ref 150–400)
RBC: 3.99 MIL/uL — ABNORMAL LOW (ref 4.22–5.81)
RDW: 14.3 % (ref 11.5–15.5)
WBC: 5.5 10*3/uL (ref 4.0–10.5)

## 2011-01-17 LAB — POCT CARDIAC MARKERS
CKMB, poc: 1.3 ng/mL (ref 1.0–8.0)
CKMB, poc: 1.9 ng/mL (ref 1.0–8.0)
Myoglobin, poc: 122 ng/mL (ref 12–200)
Myoglobin, poc: 78.8 ng/mL (ref 12–200)
Troponin i, poc: <0.05 ng/mL (ref 0.00–0.09)
Troponin i, poc: <0.05 ng/mL (ref 0.00–0.09)

## 2011-01-17 LAB — LIPASE, BLOOD: Lipase: 42 U/L (ref 11–59)

## 2011-01-22 LAB — URINALYSIS, ROUTINE W REFLEX MICROSCOPIC
Bilirubin Urine: NEGATIVE
Glucose, UA: NEGATIVE mg/dL
Hgb urine dipstick: NEGATIVE
Ketones, ur: NEGATIVE mg/dL
Nitrite: NEGATIVE
Protein, ur: NEGATIVE mg/dL
Specific Gravity, Urine: 1.005 (ref 1.005–1.030)
Urobilinogen, UA: 0.2 mg/dL (ref 0.0–1.0)
pH: 7 (ref 5.0–8.0)

## 2011-01-22 LAB — HEPATIC FUNCTION PANEL
ALT: 17 U/L (ref 0–53)
AST: 22 U/L (ref 0–37)
Albumin: 4.2 g/dL (ref 3.5–5.2)
Alkaline Phosphatase: 48 U/L (ref 39–117)
Bilirubin, Direct: <0.1 mg/dL (ref 0.0–0.3)
Total Bilirubin: 0.9 mg/dL (ref 0.3–1.2)
Total Protein: 7.8 g/dL (ref 6.0–8.3)

## 2011-01-22 LAB — POCT CARDIAC MARKERS
CKMB, poc: 1.8 ng/mL (ref 1.0–8.0)
CKMB, poc: 2.1 ng/mL (ref 1.0–8.0)
Myoglobin, poc: 107 ng/mL (ref 12–200)
Myoglobin, poc: 132 ng/mL (ref 12–200)
Troponin i, poc: <0.05 ng/mL (ref 0.00–0.09)
Troponin i, poc: <0.05 ng/mL (ref 0.00–0.09)

## 2011-01-22 LAB — POCT I-STAT, CHEM 8
BUN: 24 mg/dL — ABNORMAL HIGH (ref 6–23)
Calcium, Ion: 1.13 mmol/L (ref 1.12–1.32)
Chloride: 100 mEq/L (ref 96–112)
Creatinine, Ser: 1.5 mg/dL (ref 0.4–1.5)
Glucose, Bld: 122 mg/dL — ABNORMAL HIGH (ref 70–99)
HCT: 38 % — ABNORMAL LOW (ref 39.0–52.0)
Hemoglobin: 12.9 g/dL — ABNORMAL LOW (ref 13.0–17.0)
Potassium: 4 mEq/L (ref 3.5–5.1)
Sodium: 132 mEq/L — ABNORMAL LOW (ref 135–145)
TCO2: 27 mmol/L (ref 0–100)

## 2011-01-22 LAB — LIPASE, BLOOD: Lipase: 46 U/L (ref 11–59)

## 2011-02-04 LAB — URINALYSIS, ROUTINE W REFLEX MICROSCOPIC
Bilirubin Urine: NEGATIVE
Glucose, UA: NEGATIVE mg/dL
Hgb urine dipstick: NEGATIVE
Ketones, ur: NEGATIVE mg/dL
Nitrite: NEGATIVE
Protein, ur: NEGATIVE mg/dL
Specific Gravity, Urine: 1.009 (ref 1.005–1.030)
Urobilinogen, UA: 1 mg/dL (ref 0.0–1.0)
pH: 6 (ref 5.0–8.0)

## 2011-02-04 LAB — BASIC METABOLIC PANEL
BUN: 23 mg/dL (ref 6–23)
CO2: 25 mEq/L (ref 19–32)
Calcium: 9 mg/dL (ref 8.4–10.5)
Chloride: 102 mEq/L (ref 96–112)
Creatinine, Ser: 1.24 mg/dL (ref 0.4–1.5)
GFR calc Af Amer: >60 mL/min (ref >60)
GFR calc non Af Amer: 58 mL/min — ABNORMAL LOW (ref >60)
Glucose, Bld: 98 mg/dL (ref 70–99)
Potassium: 4.1 mEq/L (ref 3.5–5.1)
Sodium: 135 mEq/L (ref 135–145)

## 2011-02-04 LAB — CBC
HCT: 34.7 % — ABNORMAL LOW (ref 39.0–52.0)
Hemoglobin: 11.3 g/dL — ABNORMAL LOW (ref 13.0–17.0)
MCHC: 32.6 g/dL (ref 30.0–36.0)
MCV: 82.5 fL (ref 78.0–100.0)
Platelets: 194 10*3/uL (ref 150–400)
RBC: 4.2 MIL/uL — ABNORMAL LOW (ref 4.22–5.81)
RDW: 14.1 % (ref 11.5–15.5)
WBC: 4.5 10*3/uL (ref 4.0–10.5)

## 2011-02-04 LAB — DIFFERENTIAL
Basophils Absolute: 0 10*3/uL (ref 0.0–0.1)
Basophils Relative: 1 % (ref 0–1)
Eosinophils Absolute: 0.1 10*3/uL (ref 0.0–0.7)
Eosinophils Relative: 3 % (ref 0–5)
Lymphocytes Relative: 31 % (ref 12–46)
Lymphs Abs: 1.4 10*3/uL (ref 0.7–4.0)
Monocytes Absolute: 0.4 10*3/uL (ref 0.1–1.0)
Monocytes Relative: 8 % (ref 3–12)
Neutro Abs: 2.6 10*3/uL (ref 1.7–7.7)
Neutrophils Relative %: 58 % (ref 43–77)

## 2011-03-14 NOTE — Op Note (Signed)
Omar Sanders, Omar Sanders                  ACCOUNT NO.:  [id number]   MEDICAL RECORD NO.:  [id number]          PATIENT TYPE:  OIB   LOCATION:  5731                         FACILITY:  [facility]   PHYSICIAN:  Cherylynn Ridges, M.D.    DATE OF BIRTH:  [birth year]   DATE OF PROCEDURE:  07/18/2007  DATE OF DISCHARGE:                               OPERATIVE REPORT   PREOPERATIVE DIAGNOSIS:  Symptomatic cholelithiasis.   POSTOPERATIVE DIAGNOSIS:  Symptomatic cholelithiasis, with significant  hepatic adhesions to the anterior abdominal wall.   PROCEDURES:  1. Laparoscopic cholecystectomy.  2. Cholangiogram.  3. Laparoscopic enterolysis.   SURGEON:  Cherylynn Ridges, M.D.   ASSISTANT:  Wilmon Arms. Tsuei, M.D.   ANESTHESIA:  General endotracheal.   ESTIMATED BLOOD LOSS:  Less than 20 cc.   COMPLICATIONS:  None.   CONDITION:  Stable.   FINDINGS:  The patient had significant adhesions of the entire right  lobe of the liver to the anterior abdominal wall.  The cause of this is  unknown.  He had evidence of chronic cholecystitis.  The cholangiogram  was normal.   INDICATIONS FOR OPERATION:  The patient is a 70 year old Asian gentleman  with abdominal pain in the right upper quadrant with gallstones who now  comes in for an elective laparoscopic cholecystectomy.   OPERATION:  The patient was taken to the operating room and placed on  the table in the supine position.  After an adequate general anesthetic  was administered, he was prepped and draped in the usual sterile manner,  exposing the midline of the abdomen.   A supraumbilical curvilinear incision was made using a #11 blade and  taken down to the midline fascia.  There was a small fascial defect  which was enlarged with the surgeon's finger that we used for our  entrance sites.  This was grasped at the fascial level with Kocher  clamps, and then we bluntly dissected down into the peritoneal cavity  using a Kelly clamp.  The patient was placed  in reverse Trendelenburg.  Once we were in the peritoneal cavity, we passed a pursestring suture of  0 Vicryl around the fascial opening, then a Hasson cannula into the  peritoneal cavity.  Once the Hasson cannula was in place, we insufflated  carbon dioxide gas up to maximal intraabdominal pressure of 15 mmHg.  We  placed two right-sided 5 mm cannulas and a subxiphoid 11/12 mm cannula  under direct vision.  With all cannulas in place, the dissection was  begun.  We started out by lysing the adhesions between the liver and  anterior abdominal wall with laparoscopic scissors.  This was done  uneventfully.  This gave [place] a little bit more flexibility in terms of  retracting the liver and the gallbladder towards the anterior abdominal  wall and the right upper quadrant.  We then took down some adhesions to  the gallbladder dome itself as we retracted it towards the anterior  abdominal wall and the right upper quadrant.  A second grasper was  passed onto the infundibulum.  We then dissected  out the peritoneum  overlying the triangle of Calot and hepatoduodenal triangle.  This  allowed [place] to isolate and expose the cystic duct, which was about 6 mm  in size.  Once we had a clear window around the cystic duct, a cyst duct  clip was placed on the gallbladder side, a cholecystodochotomy made  using laparoscopic scissors, and then a cholangiogram performed using a  Cook catheter which had been passed through the anterior abdominal wall.  This demonstrated good flow into the duodenum, no filling defects, no  dilatation, and good proximal flow.   Once the cholangiogram was completed, we placed two distal clips along  the distal cystic duct after the catheter and securing clip and then  removed.  We then transected the cystic duct.  We dissected out the  cystic artery and placed double clips on the remaining side and  transected along the gallbladder side.  We dissect out the gallbladder  from its bed  with minimal difficulty, retrieving it from the  supraumbilical site using an EndoCatch bag.   Once this was done, we irrigated the bed and the right upper quadrant  with saline solution.  About a liter of solution was used.  Hemostasis  was obtained in the hepatic bed using electrocautery.  Once this was  done, we removed all cannulas and aspirated all fluid and gas from above  the liver.   The supraumbilical fascial site was closed using a pursestring suture  which was in place.  We injected 0.25% Marcaine with epinephrine at all  sites.  Once all cannulas were removed, we closed the supraumbilical and  subxiphoid sites with running subcuticular stitch of 4-0 Vicryl.  Once  that was done, the lateral cannula sites were closed with Dermabond.  We  placed Dermabond on the skin and Steri-Strips and Tegaderm dressings.  All needle counts, sponge counts, and instrument counts were correct.      Cherylynn Ridges, M.D.  Electronically Signed     JOW/MEDQ  D:  07/18/2007  T:  07/19/2007  Job:  [id number]

## 2011-03-14 NOTE — Cardiovascular Report (Signed)
Omar Sanders, Omar Sanders                  ACCOUNT NO.:  [id number]   MEDICAL RECORD NO.:  [id number]          PATIENT TYPE:  OIB   LOCATION:  2899                         FACILITY:  [facility]   PHYSICIAN:  Sheliah Mends, MD      DATE OF BIRTH:  Nov 09, 1940   DATE OF PROCEDURE:  06/04/2009  DATE OF DISCHARGE:                            CARDIAC CATHETERIZATION   INDICATIONS:  Mr. Passey is a 70 year old gentleman who presented to  [facility] with complaints of chest pain.  He underwent a myocardial perfusion scan that revealed evidence of  ischemia.  Given his complaints of chest pain and abnormal myocardial  perfusion scan, a decision was made to proceed with cardiac  catheterization.   PROCEDURES:  1. Left heart catheterization.  2. Selective coronary angiography.  3. Left ventriculography.   After informed consent was obtained, the patient was brought to the  second floor cardiac catheterization lab at [facility] in the  postabsorptive state.  A medical translator was available to translate  the consent paper on any questions asked by the patient.  The patient  was draped in sterile fashion.  He was started on conscious sedation  using Valium.  In addition, he received local anesthesia using 1%  lidocaine to the right groin.  A 5-French arterial sheath was inserted  into the right femoral artery.  Using the modified Seldinger technique,  a 5-French right and left Judkins catheter was used for selective  coronary angiography and a pigtail catheter was used for left  ventriculography.   FINDINGS:  1. Hemodynamics.  Left ventricular pressure 94/2 mmHg, aortic pressure      106/57 mmHg.  2. Left ventriculography shows normal left ventricular size and      function with an ejection fraction of 60%.  3. Selective coronary angiography.   Left main coronary artery:  The left main coronary artery is a large  vessel that bifurcates into the LAD and left  circumflex artery.  The  left main coronary artery has no angiographically significant disease.   Left anterior descending artery:  The left anterior descending artery is  a large vessel that gives off two diagonal branches.  There is a 50%  lesion in the mid segment of the LAD.  Otherwise, there is no  angiographically significant disease seen.   Left circumflex artery:  The left circumflex artery is a large,  codominant vessel that gives off two obtuse marginal branches.  There is  no angiographically significant disease seen.   Right coronary artery:  The right coronary artery is a large vessel that  has a 40% lesion in the mid segment.   SUMMARY:  1. Mild-to-moderate coronary artery disease without hemodynamic      significant lesion.  2. Normal left ventricular function.  3. No significant aortic valve gradient.   The procedure was completed without complications.   RECOMMENDATIONS:  The patient's chest pain is not due to coronary artery  disease and he is instructed to follow up with his primary care  physician to look for noncardiac etiology of  his chest pain such as  esophageal spasm, reflux disease, muscle or joint pain.  Given the  finding of mild-to-moderate atherosclerotic plaque formation, he will be  started on aggressive risk factor modification including a statin.      Sheliah Mends, MD  Electronically Signed     JE/MEDQ  D:  06/04/2009  T:  06/05/2009  Job:  [id number]

## 2011-03-14 NOTE — Op Note (Signed)
Omar Sanders, Omar Sanders                  ACCOUNT NO.:  [id number]   MEDICAL RECORD NO.:  [id number]          PATIENT TYPE:  AMB   LOCATION:  ENDO                         [facility]ACILITY:  [facility]   PHYSICIAN:  Anselmo Rod, M.D.  DATE OF BIRTH:  1941/08/02   DATE OF PROCEDURE:  05/27/2007  DATE OF DISCHARGE:                               OPERATIVE REPORT   PROCEDURE PERFORMED:  Esophagogastroduodenoscopy.   ENDOSCOPIST:  Dr. Anselmo Rod.   INSTRUMENT USED:  Pentax video panendoscope.   INDICATIONS FOR PROCEDURE:  A 70 year old, Asian male with a history of  guaiac-positive stools and mild anemia undergoing EGD prior to a  cholecystectomy to rule out peptic ulcer disease, esophagitis,  gastritis, etc.   PREPROCEDURE PREPARATION:  Informed consent was procured from the  patient.  The patient fasted for 8 hours prior to the procedure. The  risks and benefits of the procedure were discussed with the patient in  detail.   PREPROCEDURE PHYSICAL:  The patient had stable vital signs.  Neck  supple.  Chest clear to auscultation.  S1, S2 regular.  Abdomen soft  with normal bowel sounds.   DESCRIPTION OF PROCEDURE:  The patient was placed in the lateral  decubitus position and sedated with 50 mcg of Fentanyl and 5 mg of  Versed given intravenously in slow incremental doses. Once the patient  was adequately sedated and maintained on low-flow oxygen and continuous  cardiac monitoring, the Pentax video panendoscope was advanced through  the mouthpiece over the tongue into the esophagus under direct vision.  The entire esophagus was widely patent with no evidence of ring,  stricture, mass, esophagitis or Barrett's mucosa.  The Z-line appeared  healthy.  The scope was then advanced into the stomach.  Mild antral  gastritis was appreciated.  Retroflexion of the high cardia revealed no  abnormalities. No ulcers, erosions, masses or polyps were seen.  The  proximal small bowel appeared normal. There  was no outlet obstruction.   IMPRESSION:  Normal EGD except for mild antral gastritis.  No ulcers,  erosions, masses or polyps seen.   RECOMMENDATIONS:  1. Proceed with a colonoscopy at this time.  2. Further recommendations to be made thereafter.      Anselmo Rod, M.D.  Electronically Signed     JNM/MEDQ  D:  05/27/2007  T:  05/27/2007  Job:  [id number]   cc:   Cherylynn Ridges, M.D.  [address], [address]  [place]  [place] [address]   Dineen Kid. Reche Dixon, M.D.  Fax: [phone]

## 2011-03-14 NOTE — Op Note (Signed)
[name], Omar Sanders                  ACCOUNT NO.:  [id number]   MEDICAL RECORD NO.:  [id number]          PATIENT TYPE:  AMB   LOCATION:  ENDO                         FACILITY:  [facility]   PHYSICIAN:  Anselmo Rod, M.D.  DATE OF BIRTH:  [birth year]   DATE OF PROCEDURE:  05/27/2007  DATE OF DISCHARGE:                               OPERATIVE REPORT   PROCEDURE PERFORMED:  Colonoscopy with snare polypectomy x 3.   ENDOSCOPIST:  Anselmo Rod, M.D.   INSTRUMENT USED:  Pentax video colonoscope.   INDICATIONS FOR PROCEDURE:  A 70 year old Asian male undergoing  colonoscopy for guaiac-positive stools and mild anemia. This is being  done preoperatively before cholecystectomy for gallstones.   PREPROCEDURE PREPARATION:  Informed consent was procured from the  patient. The patient fasted for 8 hours prior to the procedure and  prepped with a bottle of magnesium citrate and a gallon of NuLYTELY the  night prior to the procedure.  Risks and benefits of the procedure  including a 10% miss rate of cancer and polyp were discussed with the  family.   PREPROCEDURE PHYSICAL:  VITAL SIGNS:  The patient had stable vital  signs.  NECK:  Supple.  CHEST:  Clear to auscultation.  HEART:  S1 and S2 regular.  ABDOMEN:  Soft with normal bowel sounds.   DESCRIPTION OF PROCEDURE:  The patient was placed in the left lateral  decubitus position and sedated with an additional 25 mcg of Fentanyl and  2.5 mg of Versed given intravenously in slow incremental doses. Once the  patient was adequately sedated and maintained on low-flow oxygen and  continuous cardiac monitoring, the Pentax video colonoscope was advanced  from the rectum to the cecum.  There was some residual stool in the  cecum and the transverse colon.  Multiple washes were done.  The  patient's position was changed from the left lateral to the supine  position.  Multiple washes were done.  The appendiceal orifice and  ileocecal valve were clearly  visualized and photographed. The terminal  ileum appeared healthy and without lesions.  Three small sessile polyps  were removed by cold snare from the right colon. One of them was removed  from the mid right colon, two from the distal right colon. There was no  evidence of diverticulosis. Retroflexion in the rectum revealed no  abnormalities.  The colonic mucosa appeared healthy.  The patient  tolerated the procedure well without complications.  Very small lesions  could be missed under the stool and debris in the colon.   IMPRESSION:  1. Three small sessile polyps removed from the right colon, otherwise      normal exam up to the terminal ileum.  2. Some residual stool in the colon.  Small lesions could be missed.   RECOMMENDATIONS:  1. Await pathology results.  2. Avoid all nonsteroidals including aspirin for the next two weeks.  3. Proceed with the cholecystectomy as planned by Dr. Jimmye Norman in      the near future.  4. Outpatient follow-up as need arises in the future.  Repeat      colonoscopy will be planned depending on the pathology results.      Anselmo Rod, M.D.  Electronically Signed     JNM/MEDQ  D:  05/27/2007  T:  05/27/2007  Job:  [id number]   cc:   Cherylynn Ridges, M.D.  [address], [address]  [place]  [place] [address]   Dineen Kid. Reche Dixon, M.D.  Fax: [phone]

## 2011-03-17 NOTE — Cardiovascular Report (Signed)
[place]. [facility]  Patient:    Omar Sanders, Omar Sanders Visit Number: [id number] MRN: [id number]          Service Type: CAT Location: [facility] 2853 01 Attending Physician:  Robynn Pane Dictated by:   Eduardo Osier Sharyn Lull, M.D. Proc. Date: 06/27/01 Adm. Date:  06/27/2001   CC:         Cardiac Catheterization Laboratory   Cardiac Catheterization  PROCEDURE:  Left cardiac catheterization with selective left and right coronary angiography, left ventriculography via right groin using Judkins technique.  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  Denies rest or nocturnal angina.  Denies PND, orthopnea, or leg swelling.  Denies palpitations, lightheadedness or syncope.  Denies nausea, vomiting, diaphoresis.  Denies shortness of breath.  The patient underwent Persantine Cardiolite on August 19 which showed small area of inferoapical ischemia with EF of 58%.  Due to typical anginal pain and mildly positive Cardiolite, the we discussed various options of treatment with the patient, i.e., medical versus invasive and consented for PCI.  PAST MEDICAL HISTORY:  As above.  PAST SURGICAL HISTORY:  None.  ALLERGIES:  He has intolerance to regular ASPIRIN.  MEDICATIONS:  He is on Altace 2.5 mg p.o. q.d., Nitro-Dur 0.2 mg/h q.d., enteric-coated aspirin 1 p.o. q.d., INH 300 mg p.o. q.d., vitamin B6 1 tablet daily.  SOCIAL HISTORY:  He is married, has four children.  No history of smoking or alcohol abuse.  He recently moved from [place].  FAMILY HISTORY:  Noncontributory for coronary artery disease.  PHYSICAL EXAMINATION:  On examination, he is alert, awake, oriented x3 in no acute distress.  Blood pressure is 130/80, pulse was 54,  regular. Conjunctivae were pink.  Neck: Supple, no JVD, no bruits.  Lungs are clear to auscultation without rhonchi or rales.  Cardiovascular examination: S1 and S2 were normal.  There was no S3, gallop or rub.  Abdomen:  Soft, bowel sounds are present, nontender.  Extremities:  There is no clubbing, cyanosis or edema.  IMPRESSION:  New onset angina, positive Persantine Cardiolite, hypertension, positive purified protein derivative.  Discussed various options of treatment with patient, i.e., medical versus invasive, its risks, i.e., death, myocardial infarction, or stroke, need for emergency coronary artery bypass graft, risks of restenosis, local vascular complications, etc., and consented for the procedure.  DESCRIPTION OF PROCEDURE:  After obtaining the informed consent, the patient was brought to the catheterization lab and was placed on the fluoroscopy table.  The right groin was prepped and draped in the usual fashion. Xylocaine 2% was used for local anesthesia in the right groin.  With the help of a thin-walled needle, a 6 French arterial sheath was placed.  The sheath was aspirated and flushed.  Next, a 6 French left Judkins catheter was advanced over the wire under fluoroscopic guidance up to the ascending aorta. The wire was pulled out.  The catheter was aspirated and connected to the manifold.  The catheter was further advanced and engaged into left coronary ostium.  Multiple views of the left system were taken.  Next, the catheter was disengaged and was pulled out over the wire and was replaced with a 6 French right Judkins catheter which was advanced over the wire under fluoroscopic guidance up to the ascending aorta.  The wire was pulled out.  The catheter was aspirated and connected to the manifold.  The catheter was further advanced and engaged into the right coronary ostium.  Multiple views of the right system were taken.  Next, the catheter was disengaged and was pulled  out over the wire and was replaced with a 6 French pigtail catheter which was advanced over the wire under fluoroscopic guidance up to the ascending aorta. The wire was pulled out, the catheter was aspirated and connected to the manifold.  The catheter was further advanced across the aortic valve into the LV.  LV pressures were recorded.  Next, left ventricular graph was done in 30 degree RAO position.  Post cineangiography pressures were recorded from LV and then pullback pressures were recorded from aorta.  There was no gradient across the aortic valve.  Next, the pigtail catheter was pulled out over the wire.  The sheaths were aspirated and flushed.  FINDINGS:  The patient has good left ventricular systolic function.  Left main was short which was patent.  LAD was patent.  Diagonal #1 and #2 were small which were patent.  Left circumflex was patent which tapers down in AV groove. OM-1 was large which bifurcates into two superior and inferior branches which were patent. RCA has 20-30% distal disease.  Arteriotomy was closed with Perclose at the end of the procedure.  The patient tolerated the procedure well.  There were no complications. The patient was transferred to recovery room in stable condition. Dictated by:   Eduardo Osier Sharyn Lull, M.D. Attending Physician:  Robynn Pane DD:  06/27/01 TD:  06/27/01 Job: [phone] [id number] [id number]

## 2011-03-17 NOTE — Procedures (Signed)
[place]. [facility]  Patient:    Omar Sanders, Omar Sanders Visit Number: [id number] MRN: [id number]          Service Type: END Location: ENDO Attending Physician:  Charna Elizabeth Dictated by:   Anselmo Rod, M.D. Proc. Date: 08/29/01 Admit Date:  08/29/2001   CC:         Mohan N. Sharyn Lull, M.D.                           Procedure Report  DATE OF BIRTH:  1941-04-28.  PROCEDURE:  Colonoscopy.  ENDOSCOPIST:  Anselmo Rod, M.D.  INSTRUMENT USED:  Pediatric Olympus colonoscope.  INDICATION FOR PROCEDURE:  Rectal bleeding in a 70 year old [place] male. Rule out colonic polyps, masses, hemorrhoids, etc.  PREPROCEDURE PREPARATION:  Informed consent was procured from the patient. The patient was fasted for eight hours prior to the procedure and prepped with a bottle of magnesium citrate and a gallon of NuLytely the night prior to the procedure.  PREPROCEDURE PHYSICAL:  VITAL SIGNS:  The patient had stable vital signs.  NECK:  Supple.  CHEST:  Clear to auscultation.  S1, S2 regular.  ABDOMEN:  Soft with normal bowel sounds.  DESCRIPTION OF PROCEDURE:  The patient was placed in the left lateral decubitus position and sedated with 60 mg of Demerol and 6 mg of Versed intravenously.  Once the patient was adequately sedate and maintained on low-flow oxygen and continuous cardiac monitoring, the Olympus video colonoscope was advanced from the rectum to the cecum without difficulty. Except for small internal hemorrhoids, no masses, polyps, erosions, ulcerations, or diverticula were seen.  IMPRESSION:  Healthy-appearing colon except for small internal hemorrhoid.  RECOMMENDATIONS: 1. A high-fiber diet has been advised. 2. Outpatient follow-up is recommended within the next four weeks. Dictated by:   Anselmo Rod, M.D. Attending Physician:  Charna Elizabeth DD:  08/29/01 TD:  08/30/01 Job: [id number]

## 2011-04-03 ENCOUNTER — Emergency Department (HOSPITAL_COMMUNITY)
Admission: EM | Admit: 2011-04-03 | Discharge: 2011-04-04 | Disposition: A | Discharge status: Home / Self Care | Payer: Medicare Other | Attending: Emergency Medicine | Admitting: Emergency Medicine

## 2011-04-03 ENCOUNTER — Emergency Department (HOSPITAL_COMMUNITY): Payer: Medicare Other

## 2011-04-03 DIAGNOSIS — R1013 Epigastric pain: Secondary | ICD-10-CM | POA: Insufficient documentation

## 2011-04-03 DIAGNOSIS — R51 Headache: Secondary | ICD-10-CM | POA: Insufficient documentation

## 2011-04-03 DIAGNOSIS — I10 Essential (primary) hypertension: Secondary | ICD-10-CM | POA: Insufficient documentation

## 2011-04-03 DIAGNOSIS — R0609 Other forms of dyspnea: Secondary | ICD-10-CM | POA: Insufficient documentation

## 2011-04-03 DIAGNOSIS — M546 Pain in thoracic spine: Secondary | ICD-10-CM | POA: Insufficient documentation

## 2011-04-03 DIAGNOSIS — M542 Cervicalgia: Secondary | ICD-10-CM | POA: Insufficient documentation

## 2011-04-03 DIAGNOSIS — K2990 Gastroduodenitis, unspecified, without bleeding: Secondary | ICD-10-CM | POA: Insufficient documentation

## 2011-04-03 DIAGNOSIS — R52 Pain, unspecified: Secondary | ICD-10-CM | POA: Insufficient documentation

## 2011-04-03 DIAGNOSIS — I2510 Atherosclerotic heart disease of native coronary artery without angina pectoris: Secondary | ICD-10-CM | POA: Insufficient documentation

## 2011-04-03 DIAGNOSIS — K219 Gastro-esophageal reflux disease without esophagitis: Secondary | ICD-10-CM | POA: Insufficient documentation

## 2011-04-03 DIAGNOSIS — R197 Diarrhea, unspecified: Secondary | ICD-10-CM | POA: Insufficient documentation

## 2011-04-03 DIAGNOSIS — K2970 Gastritis, unspecified, without bleeding: Secondary | ICD-10-CM | POA: Insufficient documentation

## 2011-04-03 DIAGNOSIS — R079 Chest pain, unspecified: Secondary | ICD-10-CM | POA: Insufficient documentation

## 2011-04-03 DIAGNOSIS — R0989 Other specified symptoms and signs involving the circulatory and respiratory systems: Secondary | ICD-10-CM | POA: Insufficient documentation

## 2011-04-03 DIAGNOSIS — M79609 Pain in unspecified limb: Secondary | ICD-10-CM | POA: Insufficient documentation

## 2011-04-03 DIAGNOSIS — Z79899 Other long term (current) drug therapy: Secondary | ICD-10-CM | POA: Insufficient documentation

## 2011-04-03 LAB — COMPREHENSIVE METABOLIC PANEL
ALT: 19 U/L (ref 0–53)
AST: 19 U/L (ref 0–37)
Albumin: 3.8 g/dL (ref 3.5–5.2)
Alkaline Phosphatase: 48 U/L (ref 39–117)
BUN: 25 mg/dL — ABNORMAL HIGH (ref 6–23)
CO2: 27 mEq/L (ref 19–32)
Calcium: 9 mg/dL (ref 8.4–10.5)
Chloride: 100 mEq/L (ref 96–112)
Creatinine, Ser: 1.35 mg/dL (ref 0.4–1.5)
GFR calc Af Amer: >60 mL/min (ref >60)
GFR calc non Af Amer: 52 mL/min — ABNORMAL LOW (ref >60)
Glucose, Bld: 99 mg/dL (ref 70–99)
Potassium: 3.8 mEq/L (ref 3.5–5.1)
Sodium: 136 mEq/L (ref 135–145)
Total Bilirubin: 0.2 mg/dL — ABNORMAL LOW (ref 0.3–1.2)
Total Protein: 7.6 g/dL (ref 6.0–8.3)

## 2011-04-03 LAB — CBC
HCT: 34.5 % — ABNORMAL LOW (ref 39.0–52.0)
Hemoglobin: 11.4 g/dL — ABNORMAL LOW (ref 13.0–17.0)
MCH: 26.3 pg (ref 26.0–34.0)
MCHC: 33 g/dL (ref 30.0–36.0)
MCV: 79.5 fL (ref 78.0–100.0)
Platelets: 165 10*3/uL (ref 150–400)
RBC: 4.34 MIL/uL (ref 4.22–5.81)
RDW: 13.6 % (ref 11.5–15.5)
WBC: 5.3 10*3/uL (ref 4.0–10.5)

## 2011-04-03 LAB — URINALYSIS, ROUTINE W REFLEX MICROSCOPIC
Bilirubin Urine: NEGATIVE
Glucose, UA: NEGATIVE mg/dL
Hgb urine dipstick: NEGATIVE
Ketones, ur: NEGATIVE mg/dL
Nitrite: NEGATIVE
Protein, ur: NEGATIVE mg/dL
Specific Gravity, Urine: 1.01 (ref 1.005–1.030)
Urobilinogen, UA: 0.2 mg/dL (ref 0.0–1.0)
pH: 7 (ref 5.0–8.0)

## 2011-04-03 LAB — DIFFERENTIAL
Basophils Absolute: 0 10*3/uL (ref 0.0–0.1)
Basophils Relative: 1 % (ref 0–1)
Eosinophils Absolute: 0.1 10*3/uL (ref 0.0–0.7)
Eosinophils Relative: 2 % (ref 0–5)
Lymphocytes Relative: 40 % (ref 12–46)
Lymphs Abs: 2.1 10*3/uL (ref 0.7–4.0)
Monocytes Absolute: 0.6 10*3/uL (ref 0.1–1.0)
Monocytes Relative: 11 % (ref 3–12)
Neutro Abs: 2.4 10*3/uL (ref 1.7–7.7)
Neutrophils Relative %: 46 % (ref 43–77)

## 2011-04-03 LAB — CK TOTAL AND CKMB (NOT AT ARMC)
CK, MB: 2.3 ng/mL (ref 0.3–4.0)
Relative Index: 1.2 (ref 0.0–2.5)
Total CK: 193 U/L (ref 7–232)

## 2011-04-03 LAB — TROPONIN I: Troponin I: <0.3 ng/mL (ref <0.30)

## 2011-08-02 LAB — DIFFERENTIAL
Basophils Absolute: 0.1
Basophils Relative: 1
Eosinophils Absolute: 0.3
Eosinophils Relative: 4
Lymphocytes Relative: 53 — ABNORMAL HIGH
Lymphs Abs: 4
Monocytes Absolute: 0.7
Monocytes Relative: 9
Neutro Abs: 2.6
Neutrophils Relative %: 33 — ABNORMAL LOW

## 2011-08-02 LAB — CBC
HCT: 39.6
Hemoglobin: 12.8 — ABNORMAL LOW
MCHC: 32.2
MCV: 82.9
Platelets: 221
RBC: 4.78
RDW: 14.1
WBC: 7.6

## 2011-08-02 LAB — POCT CARDIAC MARKERS
CKMB, poc: 3.4
Myoglobin, poc: 81.1
Troponin i, poc: <0.05

## 2011-08-02 LAB — URINE MICROSCOPIC-ADD ON

## 2011-08-02 LAB — POCT I-STAT, CHEM 8
BUN: 27 — ABNORMAL HIGH
Calcium, Ion: 1.17
Chloride: 102
Creatinine, Ser: 1.4
Glucose, Bld: 121 — ABNORMAL HIGH
HCT: 41
Hemoglobin: 13.9
Potassium: 3.3 — ABNORMAL LOW
Sodium: 136
TCO2: 26

## 2011-08-02 LAB — URINALYSIS, ROUTINE W REFLEX MICROSCOPIC
Bilirubin Urine: NEGATIVE
Glucose, UA: NEGATIVE
Hgb urine dipstick: NEGATIVE
Ketones, ur: NEGATIVE
Nitrite: NEGATIVE
Protein, ur: NEGATIVE
Specific Gravity, Urine: 1.019
Urobilinogen, UA: 0.2
pH: 5.5

## 2011-08-02 LAB — B-NATRIURETIC PEPTIDE (CONVERTED LAB): Pro B Natriuretic peptide (BNP): <30

## 2011-08-10 LAB — CBC
HCT: 36.8 — ABNORMAL LOW
Hemoglobin: 12.3 — ABNORMAL LOW
MCHC: 33.3
MCV: 80.5
Platelets: 242
RBC: 4.57
RDW: 15 — ABNORMAL HIGH
WBC: 6.5

## 2011-08-10 LAB — COMPREHENSIVE METABOLIC PANEL
ALT: 25
AST: 21
Albumin: 3.8
Alkaline Phosphatase: 52
BUN: 20
CO2: 29
Calcium: 8.9
Chloride: 104
Creatinine, Ser: 1.04
GFR calc Af Amer: >60
GFR calc non Af Amer: >60
Glucose, Bld: 116 — ABNORMAL HIGH
Potassium: 4.3
Sodium: 137
Total Bilirubin: 0.6
Total Protein: 7

## 2011-08-10 LAB — DIFFERENTIAL
Basophils Absolute: 0
Basophils Relative: 0
Eosinophils Absolute: 0.2
Eosinophils Relative: 2
Lymphocytes Relative: 32
Lymphs Abs: 2.1
Monocytes Absolute: 0.7
Monocytes Relative: 11
Neutro Abs: 3.6
Neutrophils Relative %: 55

## 2011-08-10 LAB — PROTIME-INR
INR: 0.9
Prothrombin Time: 12.3

## 2011-08-16 LAB — COMPREHENSIVE METABOLIC PANEL
ALT: 23
AST: 18
Albumin: 4.2
Alkaline Phosphatase: 54
BUN: 19
CO2: 26
Calcium: 9.5
Chloride: 105
Creatinine, Ser: 1.21
GFR calc Af Amer: >60
GFR calc non Af Amer: 60 — ABNORMAL LOW
Glucose, Bld: 110 — ABNORMAL HIGH
Potassium: 3.8
Sodium: 138
Total Bilirubin: 0.8
Total Protein: 8.2

## 2011-08-16 LAB — CBC
HCT: 40.3
Hemoglobin: 13.3
MCHC: 33
MCV: 80
Platelets: 262
RBC: 5.04
RDW: 15.2 — ABNORMAL HIGH
WBC: 8.6

## 2011-08-16 LAB — DIFFERENTIAL
Basophils Absolute: 0
Basophils Relative: 0
Eosinophils Absolute: 0
Eosinophils Relative: 0
Lymphocytes Relative: 26
Lymphs Abs: 2.2
Monocytes Absolute: 0.4
Monocytes Relative: 5
Neutro Abs: 5.9
Neutrophils Relative %: 69

## 2011-08-16 LAB — LIPASE, BLOOD: Lipase: 27

## 2012-03-08 LAB — RHEUMATOID FACTOR
Antinuclear Antibodies (ANA): NEGATIVE
Rheumatoid Factor Ab (IgM): <10

## 2012-03-08 LAB — TSH
PSA: 3.27
TSH: 1.613

## 2012-10-30 ENCOUNTER — Encounter (HOSPITAL_COMMUNITY): Payer: Self-pay | Admitting: Adult Health

## 2012-10-30 ENCOUNTER — Other Ambulatory Visit: Payer: Self-pay

## 2012-10-30 ENCOUNTER — Emergency Department (HOSPITAL_COMMUNITY): Payer: PRIVATE HEALTH INSURANCE

## 2012-10-30 DIAGNOSIS — R079 Chest pain, unspecified: Principal | ICD-10-CM | POA: Insufficient documentation

## 2012-10-30 DIAGNOSIS — E785 Hyperlipidemia, unspecified: Secondary | ICD-10-CM | POA: Insufficient documentation

## 2012-10-30 DIAGNOSIS — R1084 Generalized abdominal pain: Secondary | ICD-10-CM | POA: Insufficient documentation

## 2012-10-30 DIAGNOSIS — D649 Anemia, unspecified: Secondary | ICD-10-CM | POA: Insufficient documentation

## 2012-10-30 DIAGNOSIS — I10 Essential (primary) hypertension: Secondary | ICD-10-CM | POA: Insufficient documentation

## 2012-10-30 DIAGNOSIS — K219 Gastro-esophageal reflux disease without esophagitis: Secondary | ICD-10-CM | POA: Insufficient documentation

## 2012-10-30 DIAGNOSIS — R61 Generalized hyperhidrosis: Secondary | ICD-10-CM | POA: Insufficient documentation

## 2012-10-30 DIAGNOSIS — K589 Irritable bowel syndrome without diarrhea: Secondary | ICD-10-CM | POA: Insufficient documentation

## 2012-10-30 DIAGNOSIS — R5381 Other malaise: Secondary | ICD-10-CM | POA: Insufficient documentation

## 2012-10-30 DIAGNOSIS — R197 Diarrhea, unspecified: Secondary | ICD-10-CM | POA: Insufficient documentation

## 2012-10-30 DIAGNOSIS — R0602 Shortness of breath: Secondary | ICD-10-CM | POA: Insufficient documentation

## 2012-10-30 NOTE — ED Notes (Addendum)
Pt states per [place] interpreter that he came here because, "I have a headache, my BP is high, my stomach hurts and my left leg hurts as well. IT started at 14:30. I took the medication for my reflux and for my BP went up instead of down. My bp was 157. I have SOB and dizziness. I have chest pain on left side of chest and under left arm" nothing makes pain better. Pt states he has bilateral leg numbness as well.

## 2012-10-31 ENCOUNTER — Emergency Department (HOSPITAL_COMMUNITY): Payer: PRIVATE HEALTH INSURANCE

## 2012-10-31 ENCOUNTER — Encounter (HOSPITAL_COMMUNITY): Payer: Self-pay | Admitting: *Deleted

## 2012-10-31 ENCOUNTER — Observation Stay (HOSPITAL_COMMUNITY)
Admission: EM | Admit: 2012-10-31 | Discharge: 2012-11-01 | Disposition: A | Discharge status: Home / Self Care | Payer: PRIVATE HEALTH INSURANCE | Attending: Internal Medicine | Admitting: Internal Medicine

## 2012-10-31 DIAGNOSIS — K589 Irritable bowel syndrome without diarrhea: Secondary | ICD-10-CM

## 2012-10-31 DIAGNOSIS — G479 Sleep disorder, unspecified: Secondary | ICD-10-CM

## 2012-10-31 DIAGNOSIS — E1159 Type 2 diabetes mellitus with other circulatory complications: Secondary | ICD-10-CM | POA: Diagnosis present

## 2012-10-31 DIAGNOSIS — J309 Allergic rhinitis, unspecified: Secondary | ICD-10-CM

## 2012-10-31 DIAGNOSIS — Z860100 Personal history of colon polyps, unspecified: Secondary | ICD-10-CM

## 2012-10-31 DIAGNOSIS — K649 Unspecified hemorrhoids: Secondary | ICD-10-CM

## 2012-10-31 DIAGNOSIS — R1084 Generalized abdominal pain: Secondary | ICD-10-CM

## 2012-10-31 DIAGNOSIS — E785 Hyperlipidemia, unspecified: Secondary | ICD-10-CM

## 2012-10-31 DIAGNOSIS — D649 Anemia, unspecified: Secondary | ICD-10-CM

## 2012-10-31 DIAGNOSIS — K449 Diaphragmatic hernia without obstruction or gangrene: Secondary | ICD-10-CM

## 2012-10-31 DIAGNOSIS — K2970 Gastritis, unspecified, without bleeding: Secondary | ICD-10-CM

## 2012-10-31 DIAGNOSIS — R079 Chest pain, unspecified: Secondary | ICD-10-CM | POA: Diagnosis present

## 2012-10-31 DIAGNOSIS — R109 Unspecified abdominal pain: Secondary | ICD-10-CM

## 2012-10-31 DIAGNOSIS — K219 Gastro-esophageal reflux disease without esophagitis: Secondary | ICD-10-CM

## 2012-10-31 DIAGNOSIS — I10 Essential (primary) hypertension: Secondary | ICD-10-CM

## 2012-10-31 DIAGNOSIS — K581 Irritable bowel syndrome with constipation: Secondary | ICD-10-CM | POA: Diagnosis present

## 2012-10-31 DIAGNOSIS — K2990 Gastroduodenitis, unspecified, without bleeding: Secondary | ICD-10-CM

## 2012-10-31 HISTORY — DX: Obstructive sleep apnea (adult) (pediatric): G47.33

## 2012-10-31 HISTORY — DX: Headache: R51

## 2012-10-31 HISTORY — DX: Myoneural disorder, unspecified: G70.9

## 2012-10-31 HISTORY — DX: Essential (primary) hypertension: I10

## 2012-10-31 HISTORY — DX: Gastro-esophageal reflux disease without esophagitis: K21.9

## 2012-10-31 HISTORY — DX: Anxiety disorder, unspecified: F41.9

## 2012-10-31 LAB — BASIC METABOLIC PANEL
BUN: 17 mg/dL (ref 6–23)
CO2: 24 mEq/L (ref 19–32)
Calcium: 9.5 mg/dL (ref 8.4–10.5)
Chloride: 100 mEq/L (ref 96–112)
Creatinine, Ser: 1.2 mg/dL (ref 0.50–1.35)
GFR calc Af Amer: 67 mL/min — ABNORMAL LOW (ref >90)
GFR calc non Af Amer: 58 mL/min — ABNORMAL LOW (ref >90)
Glucose, Bld: 103 mg/dL — ABNORMAL HIGH (ref 70–99)
Potassium: 4.1 mEq/L (ref 3.5–5.1)
Sodium: 135 mEq/L (ref 135–145)

## 2012-10-31 LAB — HEPATIC FUNCTION PANEL
ALT: 25 U/L (ref 0–53)
ALT: 25 U/L (ref 0–53)
AST: 24 U/L (ref 0–37)
AST: 24 U/L (ref 0–37)
Albumin: 4 g/dL (ref 3.5–5.2)
Albumin: 4 g/dL (ref 3.5–5.2)
Alkaline Phosphatase: 46 U/L (ref 39–117)
Alkaline Phosphatase: 46 U/L (ref 39–117)
Bilirubin, Direct: <0.1 mg/dL (ref 0.0–0.3)
Bilirubin, Direct: 0.1 mg/dL (ref 0.0–0.3)
Indirect Bilirubin: 0.2 mg/dL — ABNORMAL LOW (ref 0.3–0.9)
Total Bilirubin: 0.3 mg/dL (ref 0.3–1.2)
Total Bilirubin: 0.3 mg/dL (ref 0.3–1.2)
Total Protein: 7.6 g/dL (ref 6.0–8.3)
Total Protein: 7.6 g/dL (ref 6.0–8.3)

## 2012-10-31 LAB — URINALYSIS, ROUTINE W REFLEX MICROSCOPIC
Bilirubin Urine: NEGATIVE
Glucose, UA: NEGATIVE mg/dL
Hgb urine dipstick: NEGATIVE
Ketones, ur: NEGATIVE mg/dL
Leukocytes, UA: NEGATIVE
Nitrite: NEGATIVE
Protein, ur: NEGATIVE mg/dL
Specific Gravity, Urine: 1.006 (ref 1.005–1.030)
Urobilinogen, UA: 0.2 mg/dL (ref 0.0–1.0)
pH: 6.5 (ref 5.0–8.0)

## 2012-10-31 LAB — CK TOTAL AND CKMB (NOT AT ARMC)
CK, MB: 2.7 ng/mL (ref 0.3–4.0)
CK, MB: 2.7 ng/mL (ref 0.3–4.0)
Relative Index: 2.3 (ref 0.0–2.5)
Relative Index: 2.3 (ref 0.0–2.5)
Total CK: 119 U/L (ref 7–232)
Total CK: 119 U/L (ref 7–232)

## 2012-10-31 LAB — CBC
HCT: 31 % — ABNORMAL LOW (ref 39.0–52.0)
Hemoglobin: 9.9 g/dL — ABNORMAL LOW (ref 13.0–17.0)
MCH: 25.6 pg — ABNORMAL LOW (ref 26.0–34.0)
MCHC: 31.9 g/dL (ref 30.0–36.0)
MCV: 80.3 fL (ref 78.0–100.0)
Platelets: 178 10*3/uL (ref 150–400)
RBC: 3.86 MIL/uL — ABNORMAL LOW (ref 4.22–5.81)
RDW: 14 % (ref 11.5–15.5)
WBC: 4.7 10*3/uL (ref 4.0–10.5)

## 2012-10-31 LAB — TROPONIN I
Troponin I: <0.3 ng/mL (ref <0.30)
Troponin I: <0.3 ng/mL (ref <0.30)
Troponin I: <0.3 ng/mL (ref <0.30)
Troponin I: <0.3 ng/mL (ref <0.30)

## 2012-10-31 LAB — LIPASE, BLOOD
Lipase: 40 U/L (ref 11–59)
Lipase: 40 U/L (ref 11–59)

## 2012-10-31 LAB — POCT I-STAT TROPONIN I: Troponin i, poc: 0 ng/mL (ref 0.00–0.08)

## 2012-10-31 LAB — CG4 I-STAT (LACTIC ACID): Lactic Acid, Venous: 1.95 mmol/L (ref 0.5–2.2)

## 2012-10-31 MED ORDER — PANTOPRAZOLE SODIUM 40 MG PO TBEC
40.0000 mg | DELAYED_RELEASE_TABLET | Freq: Every day | ORAL | Status: DC
  Administered 2012-10-31 – 2012-11-01 (×2): 40 mg via ORAL
  Filled 2012-10-31 (×2): qty 1

## 2012-10-31 MED ORDER — ACETAMINOPHEN 650 MG RE SUPP: 650.0000 mg | Freq: Four times a day (QID) | RECTAL | Status: DC | PRN

## 2012-10-31 MED ORDER — TRAMADOL HCL 50 MG PO TABS
50.0000 mg | ORAL_TABLET | Freq: Four times a day (QID) | ORAL | Status: DC | PRN
  Administered 2012-10-31: 50 mg via ORAL
  Filled 2012-10-31: qty 1

## 2012-10-31 MED ORDER — QUETIAPINE FUMARATE ER 300 MG PO TB24
300.0000 mg | ORAL_TABLET | Freq: Every day | ORAL | Status: DC
  Administered 2012-10-31: 300 mg via ORAL
  Filled 2012-10-31 (×2): qty 1

## 2012-10-31 MED ORDER — SODIUM CHLORIDE 0.9 % IV SOLN: INTRAVENOUS | Status: DC

## 2012-10-31 MED ORDER — ONDANSETRON HCL 4 MG PO TABS
4.0000 mg | ORAL_TABLET | Freq: Four times a day (QID) | ORAL | Status: DC | PRN
  Administered 2012-10-31: 4 mg via ORAL
  Filled 2012-10-31: qty 1

## 2012-10-31 MED ORDER — ACETAMINOPHEN 325 MG PO TABS
650.0000 mg | ORAL_TABLET | Freq: Four times a day (QID) | ORAL | Status: DC | PRN
  Administered 2012-11-01: 650 mg via ORAL
  Filled 2012-10-31 (×2): qty 2

## 2012-10-31 MED ORDER — LISINOPRIL 20 MG PO TABS
20.0000 mg | ORAL_TABLET | Freq: Every day | ORAL | Status: DC
  Administered 2012-10-31 – 2012-11-01 (×2): 20 mg via ORAL
  Filled 2012-10-31 (×2): qty 1

## 2012-10-31 MED ORDER — FUROSEMIDE 20 MG PO TABS
20.0000 mg | ORAL_TABLET | Freq: Every day | ORAL | Status: DC
  Administered 2012-10-31 – 2012-11-01 (×2): 20 mg via ORAL
  Filled 2012-10-31 (×2): qty 1

## 2012-10-31 MED ORDER — LUBIPROSTONE 24 MCG PO CAPS
24.0000 ug | ORAL_CAPSULE | Freq: Every day | ORAL | Status: DC
  Administered 2012-11-01: 24 ug via ORAL
  Filled 2012-10-31 (×2): qty 1

## 2012-10-31 MED ORDER — SODIUM CHLORIDE 0.9 % IV SOLN
Freq: Once | INTRAVENOUS | Status: AC
  Administered 2012-10-31: 03:00:00 via INTRAVENOUS

## 2012-10-31 MED ORDER — MORPHINE SULFATE 2 MG/ML IJ SOLN: 1.0000 mg | INTRAMUSCULAR | Status: DC | PRN

## 2012-10-31 MED ORDER — OXYCODONE HCL 5 MG PO TABS
5.0000 mg | ORAL_TABLET | ORAL | Status: DC | PRN
  Administered 2012-10-31 (×2): 5 mg via ORAL
  Filled 2012-10-31 (×2): qty 1

## 2012-10-31 MED ORDER — SODIUM CHLORIDE 0.9 % IV BOLUS (SEPSIS)
500.0000 mL | Freq: Once | INTRAVENOUS | Status: AC
  Administered 2012-10-31: 500 mL via INTRAVENOUS

## 2012-10-31 MED ORDER — REGADENOSON 0.4 MG/5ML IV SOLN
0.4000 mg | Freq: Once | INTRAVENOUS | Status: AC
  Administered 2012-11-01: 0.4 mg via INTRAVENOUS
  Filled 2012-10-31: qty 5

## 2012-10-31 MED ORDER — TRIAZOLAM 0.25 MG PO TABS: 0.2500 mg | ORAL_TABLET | Freq: Every evening | ORAL | Status: DC | PRN

## 2012-10-31 MED ORDER — ESCITALOPRAM OXALATE 10 MG PO TABS
10.0000 mg | ORAL_TABLET | Freq: Every day | ORAL | Status: DC
  Administered 2012-10-31 – 2012-11-01 (×2): 10 mg via ORAL
  Filled 2012-10-31 (×2): qty 1

## 2012-10-31 MED ORDER — NITROGLYCERIN 0.4 MG SL SUBL
0.4000 mg | SUBLINGUAL_TABLET | SUBLINGUAL | Status: DC | PRN
  Administered 2012-10-31: 0.4 mg via SUBLINGUAL

## 2012-10-31 MED ORDER — DICYCLOMINE HCL 20 MG PO TABS
20.0000 mg | ORAL_TABLET | Freq: Three times a day (TID) | ORAL | Status: DC
  Administered 2012-10-31 – 2012-11-01 (×3): 20 mg via ORAL
  Filled 2012-10-31 (×6): qty 1

## 2012-10-31 MED ORDER — TRAZODONE HCL 50 MG PO TABS
50.0000 mg | ORAL_TABLET | Freq: Every day | ORAL | Status: DC
  Administered 2012-10-31: 50 mg via ORAL
  Filled 2012-10-31 (×2): qty 1

## 2012-10-31 MED ORDER — ONDANSETRON HCL 4 MG/2ML IJ SOLN: 4.0000 mg | Freq: Four times a day (QID) | INTRAMUSCULAR | Status: DC | PRN

## 2012-10-31 MED ORDER — FENTANYL CITRATE 0.05 MG/ML IJ SOLN
50.0000 ug | Freq: Once | INTRAMUSCULAR | Status: AC
  Administered 2012-10-31: 50 ug via INTRAVENOUS
  Filled 2012-10-31: qty 2

## 2012-10-31 MED ORDER — GI COCKTAIL ~~LOC~~
30.0000 mL | Freq: Three times a day (TID) | ORAL | Status: DC | PRN
  Administered 2012-10-31 – 2012-11-01 (×2): 30 mL via ORAL
  Filled 2012-10-31 (×3): qty 30

## 2012-10-31 MED ORDER — IOHEXOL 300 MG/ML  SOLN
100.0000 mL | Freq: Once | INTRAMUSCULAR | Status: AC | PRN
  Administered 2012-10-31: 100 mL via INTRAVENOUS

## 2012-10-31 MED ORDER — ENOXAPARIN SODIUM 40 MG/0.4ML ~~LOC~~ SOLN
40.0000 mg | SUBCUTANEOUS | Status: DC
  Administered 2012-10-31: 40 mg via SUBCUTANEOUS
  Filled 2012-10-31 (×2): qty 0.4

## 2012-10-31 NOTE — ED Notes (Addendum)
Patient urinated on own; in and out cath not needed.  Dr. Bebe Shaggy notified.

## 2012-10-31 NOTE — ED Notes (Signed)
CT called; pt completed contrast 

## 2012-10-31 NOTE — ED Notes (Signed)
Dr. Bebe Shaggy at bedside with interpreter phones.

## 2012-10-31 NOTE — ED Notes (Signed)
Patient states that he needs to urinate; given urinal.

## 2012-10-31 NOTE — Consult Note (Signed)
Reason for Consult:Chest Pain Referring Physician:   ** Omar Sanders is [place] and dose not speak [place]. The history is provided via an interpreter.**  HPI: Omar Sanders is an 72 y.o. male, previously followed by Dr. Garen Lah. His last office visit was March 2011. In July of 2011, he had a NST, due to complaints of CP and DOE, which was suggestive of mild ischemia. 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 He also has a history of dyslipidemia and OSA.   Omar Sanders reported to the ED this morning with a chief complaint of CP. He reports being bothered with CP intermittently for the past three years, but reports experiencing sever pain yesterday, while standing, watching the news at home.  It was 9/10 at onset. Described as sharp/burning pain in the left upper chest with radiation to his left neck and ear. Currently, his chest pain is 6/10. It is worse with movement, but not pleuritic. It is relieved with rest.  He feels short of breath when the pain increases in intensity. He also endorses feeling lightheaded and having blurry vision yesterday during initial episode, but both have subsided. No associated diaphoresis, n/v, palpitations, orthopnea, PND or cough. He reports chronic headaches and bilateral leg pain. He was priviously on statin therapy for dyslipidemia, but reports that he stopped taking his medications roughly 2 months ago because his, "last set of blood work returned normal".   Past Medical History  Diagnosis Date  . GERD (gastroesophageal reflux disease)   . Hypertension     Past Surgical History  Procedure Date  . Cardiac catheterization     History reviewed. No pertinent family history.  Social History:  reports that he has never smoked. He does not have any smokeless tobacco history on file. He reports that he does not drink alcohol. His drug history not on file.  Allergies:    Allergies  Allergen Reactions  . Aspirin   . Vitamin C     Medications:  Scheduled:   . dicyclomine  20 mg Oral TID  . enoxaparin (LOVENOX) injection  40 mg Subcutaneous Q24H  . escitalopram  10 mg Oral Daily  . furosemide  20 mg Oral Daily  . lisinopril  20 mg Oral Daily  . lubiprostone  24 mcg Oral Q breakfast  . pantoprazole  40 mg Oral Daily  . QUEtiapine Fumarate  300 mg Oral QHS  . traZODone  50 mg Oral QHS    Results for orders placed during the hospital encounter of 10/31/12 (from the past 48 hour(s))  CBC     Status: Abnormal   Collection Time   10/30/12 11:06 PM      Component Value Range Comment   WBC 4.7  4.0 - 10.5 K/uL    RBC 3.86 (*) 4.22 - 5.81 MIL/uL    Hemoglobin 9.9 (*) 13.0 - 17.0 g/dL    HCT 16.1 (*) 09.6 - 52.0 %    MCV 80.3  78.0 - 100.0 fL    MCH 25.6 (*) 26.0 - 34.0 pg    MCHC 31.9  30.0 - 36.0 g/dL    RDW 04.5  40.9 - 81.1 %    Platelets 178  150 - 400 K/uL   BASIC METABOLIC PANEL     Status: Abnormal   Collection Time   10/30/12 11:06 PM      Component Value Range Comment   Sodium 135  135 - 145  mEq/L    Potassium 4.1  3.5 - 5.1 mEq/L    Chloride 100  96 - 112 mEq/L    CO2 24  19 - 32 mEq/L    Glucose, Bld 103 (*) 70 - 99 mg/dL    BUN 17  6 - 23 mg/dL    Creatinine, Ser 1.47  0.50 - 1.35 mg/dL    Calcium 9.5  8.4 - 82.9 mg/dL    GFR calc non Af Amer 58 (*) >90 mL/min    GFR calc Af Amer 67 (*) >90 mL/min   URINALYSIS, ROUTINE W REFLEX MICROSCOPIC     Status: Normal   Collection Time   10/31/12  1:54 AM      Component Value Range Comment   Color, Urine YELLOW  YELLOW    APPearance CLEAR  CLEAR    Specific Gravity, Urine 1.006  1.005 - 1.030    pH 6.5  5.0 - 8.0    Glucose, UA NEGATIVE  NEGATIVE mg/dL    Hgb urine dipstick NEGATIVE  NEGATIVE    Bilirubin Urine NEGATIVE  NEGATIVE    Ketones, ur NEGATIVE  NEGATIVE mg/dL    Protein, ur NEGATIVE  NEGATIVE mg/dL    Urobilinogen, UA 0.2  0.0 - 1.0 mg/dL    Nitrite NEGATIVE  NEGATIVE     Leukocytes, UA NEGATIVE  NEGATIVE MICROSCOPIC NOT DONE ON URINES WITH NEGATIVE PROTEIN, BLOOD, LEUKOCYTES, NITRITE, OR GLUCOSE <1000 mg/dL.  TROPONIN I     Status: Normal   Collection Time   10/31/12  1:57 AM      Component Value Range Comment   Troponin I <0.30  <0.30 ng/mL   HEPATIC FUNCTION PANEL     Status: Normal   Collection Time   10/31/12  1:57 AM      Component Value Range Comment   Total Protein 7.6  6.0 - 8.3 g/dL    Albumin 4.0  3.5 - 5.2 g/dL    AST 24  0 - 37 U/L    ALT 25  0 - 53 U/L    Alkaline Phosphatase 46  39 - 117 U/L    Total Bilirubin 0.3  0.3 - 1.2 mg/dL    Bilirubin, Direct <5.6  0.0 - 0.3 mg/dL    Indirect Bilirubin NOT CALCULATED  0.3 - 0.9 mg/dL   LIPASE, BLOOD     Status: Normal   Collection Time   10/31/12  1:57 AM      Component Value Range Comment   Lipase 40  11 - 59 U/L   CK TOTAL AND CKMB     Status: Normal   Collection Time   10/31/12  1:57 AM      Component Value Range Comment   Total CK 119  7 - 232 U/L    CK, MB 2.7  0.3 - 4.0 ng/mL    Relative Index 2.3  0.0 - 2.5   TROPONIN I     Status: Normal   Collection Time   10/31/12  5:24 AM      Component Value Range Comment   Troponin I <0.30  <0.30 ng/mL   LIPASE, BLOOD     Status: Normal   Collection Time   10/31/12  5:24 AM      Component Value Range Comment   Lipase 40  11 - 59 U/L   CK TOTAL AND CKMB     Status: Normal   Collection Time   10/31/12  5:24 AM  Component Value Range Comment   Total CK 119  7 - 232 U/L    CK, MB 2.7  0.3 - 4.0 ng/mL    Relative Index 2.3  0.0 - 2.5   HEPATIC FUNCTION PANEL     Status: Abnormal   Collection Time   10/31/12  5:24 AM      Component Value Range Comment   Total Protein 7.6  6.0 - 8.3 g/dL    Albumin 4.0  3.5 - 5.2 g/dL    AST 24  0 - 37 U/L    ALT 25  0 - 53 U/L    Alkaline Phosphatase 46  39 - 117 U/L    Total Bilirubin 0.3  0.3 - 1.2 mg/dL    Bilirubin, Direct 0.1  0.0 - 0.3 mg/dL    Indirect Bilirubin 0.2 (*) 0.3 - 0.9 mg/dL     Dg Chest  2 View  11/04/1094  *RADIOLOGY REPORT*  Clinical Data: Chest pain and shortness of breath.  CHEST - 2 VIEW  Comparison: Chest radiograph performed 04/03/2011  Findings: The lungs are well-aerated and clear.  There is no evidence of focal opacification, pleural effusion or pneumothorax. Bilateral nipple shadows are noted.  The heart is normal in size; the mediastinal contour is within normal limits.  No acute osseous abnormalities are seen.  Clips are noted within the right upper quadrant, reflecting prior cholecystectomy.  IMPRESSION: No acute cardiopulmonary process seen.   Original Report Authenticated By: Tonia Ghent, M.D.    Ct Abdomen Pelvis W Contrast  10/31/2012  *RADIOLOGY REPORT*  Clinical Data: Headache and bilateral arm pain; generalized body aches and abdominal pain.  Chest pain.  Diarrhea.  CT ABDOMEN AND PELVIS WITH CONTRAST  Technique:  Multidetector CT imaging of the abdomen and pelvis was performed following the standard protocol during bolus administration of intravenous contrast.  Contrast: OMNIPAQUE IOHEXOL 300 MG/ML  SOLN  Comparison: CT of the abdomen and pelvis performed 03/16/2010  Findings:  Minimal bibasilar atelectasis is noted.  The liver and spleen are unremarkable in appearance.  The patient is status post cholecystectomy, with clips noted along the gallbladder fossa.  The pancreas and adrenal glands are unremarkable in appearance.  Scattered hypodensities are noted within both kidneys, compatible with cysts, measuring up to 2.2 cm in size.  Mild right renal scarring is noted.  The kidneys are otherwise unremarkable.  There is no evidence of hydronephrosis.  No renal or ureteral stones are seen.  This could be perinephric stranding is appreciated.  No free fluid is identified.  The small bowel is unremarkable in appearance.  The stomach is within normal limits.  No acute vascular abnormalities are seen.  Mild intramural thrombus is noted along the abdominal aorta, without  significant luminal narrowing. Relatively diffuse calcification is seen along the abdominal aorta and its branches.  The appendix is normal in caliber, without evidence for appendicitis.  The colon is largely filled with stool and is unremarkable in appearance; there is mild redundancy of the sigmoid colon.  The bladder is moderately distended and grossly unremarkable in appearance.  The prostate is enlarged, measuring 5.2 cm in transverse dimension, with nodular impression on the base of the bladder.  No inguinal lymphadenopathy is seen.  No acute osseous abnormalities are identified.  IMPRESSION:  1.  No acute abnormality seen within the abdomen or pelvis. 2.  Mild intramural thrombus and relatively diffuse calcification along the abdominal aorta, without significant luminal narrowing. 3.  Scattered bilateral renal  cysts seen; mild right renal scarring noted. 4.  Enlarged prostate noted, with nodular impression on the base of the bladder.  Would correlate with PSA.   Original Report Authenticated By: Tonia Ghent, M.D.     Review of Systems  Constitutional: Negative for diaphoresis.  HENT: Positive for ear pain, neck pain and tinnitus.   Eyes: Positive for blurred vision.  Respiratory: Positive for shortness of breath. Negative for cough.   Cardiovascular: Positive for chest pain and claudication. Negative for palpitations, orthopnea, leg swelling and PND.  Gastrointestinal: Positive for heartburn. Negative for nausea, vomiting and abdominal pain.  Musculoskeletal: Negative for back pain.  Neurological: Positive for headaches. Negative for dizziness.   Blood pressure 132/76, pulse 87, temperature 98 F (36.7 C), temperature source Oral, resp. rate 16, SpO2 98.00%. Physical Exam  Constitutional: He is oriented to person, place, and time. He appears well-developed and well-nourished. No distress.  HENT:  Head: Normocephalic and atraumatic.  Eyes: Conjunctivae normal are normal. Pupils are equal,  round, and reactive to light.  Neck: Normal range of motion. No JVD present.  Cardiovascular: Normal rate, regular rhythm, normal heart sounds, intact distal pulses and normal pulses.  Exam reveals no gallop and no friction rub.   No murmur heard. Pulses:      Radial pulses are 2+ on the right side, and 2+ on the left side.       Dorsalis pedis pulses are 2+ on the right side, and 2+ on the left side.  Respiratory: Effort normal and breath sounds normal. No respiratory distress. He has no wheezes. He has no rales. He exhibits no tenderness.  GI: Soft. Bowel sounds are normal. He exhibits no distension and no mass. There is no tenderness.  Musculoskeletal: Normal range of motion. He exhibits no edema.  Neurological: He is alert and oriented to person, place, and time.  Skin: Skin is warm and dry. He is not diaphoretic.  Psychiatric: He has a normal mood and affect. His behavior is normal.    Assessment/Plan: Pt continues to endorse 6/10 CP. Initial EKG showed NSR w/ 1st degree AV block, but no acute changes.  Troponin negative x 3. CXR normal. HR stable. BP slightly elevated at 143/91. Will order Lexiscan Myoview NST to evaluate ischemia and will make NPO starting at midnight. SL NTG and morphine prn has already been ordered by ED staff. Will also order a lipid profile. Cardiologist to follow.  Omar Sanders 10/31/2012, 9:41 AM

## 2012-10-31 NOTE — Progress Notes (Signed)
UR Completed Kuulei Kleier Graves-Bigelow, RN,BSN [phone]  

## 2012-10-31 NOTE — H&P (Signed)
Patient's PCP: Jearld Lesch, MD Patient's cardiologist: [facility] Patient's GI: Dr. Loreta Ave  Chief Complaint: Chest pain and abdominal pain.  History of Present Illness: Omar Sanders is a 72 y.o. [place] male who does not speak [place] and history was obtained via telephone interpreter who presents with the above complaints.  Patient has a history of GERD, hypertension, chronic abdominal pain, coronary artery disease status post last cardiac catheter in 2011 which showed a mild to moderate coronary disease with normal LV function.  Patient reports that he has been having left-sided abdominal and chest pain for many years.  He has had extensive workup for his abdominal pain with multiple endoscopies, most recent on 06/20/2010 which showed a normal endoscopy.  He also has been having left-sided chest pain again for the last few years.  He presented to the emergency department for further evaluation.  Patient indicates that his chest pain has been 6-7 to 10/10 at home.  Not sure what makes it better or what makes it worse.  He also is not sure what makes the abdominal pain better or worse.  In the emergency department and was found to be slightly diaphoretic as a result the hospitalist service was consult and for further care and management.  Has not had any fevers or chills at home.  Currently denies any shortness of breath.  Does have intermittent episodes of diarrhea and constipation.  He does report intermittently coughing chronically which is nonproductive.  Currently denies any cough.  For other review of systems please refer below.  Past Medical History  Diagnosis Date  . GERD (gastroesophageal reflux disease)   . Hypertension    Past Surgical History  Procedure Date  . Cardiac catheterization    History reviewed. No pertinent family history. History   Social History  . Marital Status: Widowed    Spouse Name: N/A    Number of Children: N/A  . Years of Education: N/A   Occupational  History  . Not on file.   Social History Main Topics  . Smoking status: Never Smoker   . Smokeless tobacco: Not on file  . Alcohol Use: No  . Drug Use:   . Sexually Active:    Other Topics Concern  . Not on file   Social History Narrative  . No narrative on file   Allergies: Aspirin and Vitamin c  Home Meds: Prior to Admission medications   Medication Sig Start Date End Date Taking? Authorizing Provider  diclofenac sodium (VOLTAREN) 1 % GEL Apply 2 g topically daily as needed.   Yes Historical Provider, MD  dicyclomine (BENTYL) 20 MG tablet Take 20 mg by mouth 3 (three) times daily.   Yes Historical Provider, MD  escitalopram (LEXAPRO) 10 MG tablet Take 10 mg by mouth daily.   Yes Historical Provider, MD  furosemide (LASIX) 20 MG tablet Take 20 mg by mouth daily.   Yes Historical Provider, MD  lansoprazole (PREVACID) 30 MG capsule Take 30 mg by mouth daily.   Yes Historical Provider, MD  lisinopril (PRINIVIL,ZESTRIL) 20 MG tablet Take 20 mg by mouth daily.   Yes Historical Provider, MD  lubiprostone (AMITIZA) 24 MCG capsule Take 24 mcg by mouth daily with breakfast.   Yes Historical Provider, MD  meloxicam (MOBIC) 7.5 MG tablet Take 7.5 mg by mouth 2 (two) times daily.   Yes Historical Provider, MD  QUEtiapine Fumarate (SEROQUEL XR) 150 MG 24 hr tablet Take 300 mg by mouth at bedtime.   Yes Historical Provider, MD  traZODone (DESYREL) 50 MG tablet Take 50 mg by mouth at bedtime.   Yes Historical Provider, MD  triazolam (HALCION) 0.25 MG tablet Take 0.25 mg by mouth at bedtime as needed.   Yes Historical Provider, MD    Review of Systems: All systems reviewed with the patient and positive as per history of present illness, otherwise all other systems are negative.  Physical Exam: Blood pressure 132/76, pulse 87, temperature 98 F (36.7 C), temperature source Oral, resp. rate 16, SpO2 98.00%. General: Awake, Oriented x3, No acute distress. HEENT: EOMI, Moist mucous  membranes Neck: Supple CV: S1 and S2 Lungs: Clear to ascultation bilaterally Abdomen: Soft, tender in the left upper quadrant, Nondistended, +bowel sounds. Ext: Good pulses. Trace edema. No clubbing or cyanosis noted. Neuro: Cranial Nerves II-XII grossly intact. Has 5/5 motor strength in upper and lower extremities.  Lab results:  [facility] 10/30/12 2306  NA 135  K 4.1  CL 100  CO2 24  GLUCOSE 103*  BUN 17  CREATININE 1.20  CALCIUM 9.5  MG --  PHOS --    Basename 10/31/12 0524 10/31/12 0157  AST 24 24  ALT 25 25  ALKPHOS 46 46  BILITOT 0.3 0.3  PROT 7.6 7.6  ALBUMIN 4.0 4.0    Basename 10/31/12 0524 10/31/12 0157  LIPASE 40 40  AMYLASE -- --    Basename 10/30/12 2306  WBC 4.7  NEUTROABS --  HGB 9.9*  HCT 31.0*  MCV 80.3  PLT 178    Basename 10/31/12 0524 10/31/12 0157  CKTOTAL 119 119  CKMB 2.7 2.7  CKMBINDEX -- --  TROPONINI <0.30 <0.30   No components found with this basename: POCBNP:3 No results found for this basename: DDIMER in the last 72 hours No results found for this basename: HGBA1C:2 in the last 72 hours No results found for this basename: CHOL:2,HDL:2,LDLCALC:2,TRIG:2,CHOLHDL:2,LDLDIRECT:2 in the last 72 hours No results found for this basename: TSH,T4TOTAL,FREET3,T3FREE,THYROIDAB in the last 72 hours No results found for this basename: VITAMINB12:2,FOLATE:2,FERRITIN:2,TIBC:2,IRON:2,RETICCTPCT:2 in the last 72 hours Imaging results:  Dg Chest 2 View  10/30/2012  *RADIOLOGY REPORT*  Clinical Data: Chest pain and shortness of breath.  CHEST - 2 VIEW  Comparison: Chest radiograph performed 04/03/2011  Findings: The lungs are well-aerated and clear.  There is no evidence of focal opacification, pleural effusion or pneumothorax. Bilateral nipple shadows are noted.  The heart is normal in size; the mediastinal contour is within normal limits.  No acute osseous abnormalities are seen.  Clips are noted within the right upper quadrant, reflecting prior  cholecystectomy.  IMPRESSION: No acute cardiopulmonary process seen.   Original Report Authenticated By: Tonia Ghent, M.D.    Ct Abdomen Pelvis W Contrast  10/31/2012  *RADIOLOGY REPORT*  Clinical Data: Headache and bilateral arm pain; generalized body aches and abdominal pain.  Chest pain.  Diarrhea.  CT ABDOMEN AND PELVIS WITH CONTRAST  Technique:  Multidetector CT imaging of the abdomen and pelvis was performed following the standard protocol during bolus administration of intravenous contrast.  Contrast: OMNIPAQUE IOHEXOL 300 MG/ML  SOLN  Comparison: CT of the abdomen and pelvis performed 03/16/2010  Findings:  Minimal bibasilar atelectasis is noted.  The liver and spleen are unremarkable in appearance.  The patient is status post cholecystectomy, with clips noted along the gallbladder fossa.  The pancreas and adrenal glands are unremarkable in appearance.  Scattered hypodensities are noted within both kidneys, compatible with cysts, measuring up to 2.2 cm in size.  Mild right renal scarring  is noted.  The kidneys are otherwise unremarkable.  There is no evidence of hydronephrosis.  No renal or ureteral stones are seen.  This could be perinephric stranding is appreciated.  No free fluid is identified.  The small bowel is unremarkable in appearance.  The stomach is within normal limits.  No acute vascular abnormalities are seen.  Mild intramural thrombus is noted along the abdominal aorta, without significant luminal narrowing. Relatively diffuse calcification is seen along the abdominal aorta and its branches.  The appendix is normal in caliber, without evidence for appendicitis.  The colon is largely filled with stool and is unremarkable in appearance; there is mild redundancy of the sigmoid colon.  The bladder is moderately distended and grossly unremarkable in appearance.  The prostate is enlarged, measuring 5.2 cm in transverse dimension, with nodular impression on the base of the bladder.  No  inguinal lymphadenopathy is seen.  No acute osseous abnormalities are identified.  IMPRESSION:  1.  No acute abnormality seen within the abdomen or pelvis. 2.  Mild intramural thrombus and relatively diffuse calcification along the abdominal aorta, without significant luminal narrowing. 3.  Scattered bilateral renal cysts seen; mild right renal scarring noted. 4.  Enlarged prostate noted, with nodular impression on the base of the bladder.  Would correlate with PSA.   Original Report Authenticated By: Tonia Ghent, M.D.    Other results: EKG: Heart rate 68 with first degree AV block.  Assessment & Plan by Problem: Chest pain Etiology unclear.  Admit to telemetry.  Cycle cardiac enzymes.  EKG not concerning for any ischemic changes, chest x-ray shows no acute cardiopulmonary process.  Given chronic nature of his chest pain, requested cardiology consultation.  Appreciate their input.  Troponin negative x2.  Lipid panel in the morning.  Chronic abdominal pain Etiology unclear.  Patient had CT of the abdomen and pelvis with contrast on 10/31/2012 which showed no acute abnormality for abdominal pain, mild intramural thrombus and relatively diffuse calcification along the abdominal aorta. Discussed with Dr. Elnoria Howard, GI who will evaluate the patient.  Hyperlipidemia Check anemia panel in the morning.  Hypertension Continue lisinopril and furosemide.  Depression Continue Lexapro and quetiapine.  Patient denies any suicidal thoughts or ideation.  He does note feeling depressed at times.  Patient to followup with his primary care physician for further care and management.  GERD/irritable bowel syndrome? Continue PPI, lubiprostone, and dicyclomine.  Further management as per GI.  Anemia Check anemia panel in the morning.  Prophylaxis Lovenox.  CODE STATUS Full code.  This was discussed with the patient at the time of admission.  Disposition Admit the patient as observation to  telemetry.   Time spent on admission, talking to the patient, and coordinating care was: 60 mins.  Nyilah Kight A, MD 10/31/2012, 9:00 AM

## 2012-10-31 NOTE — ED Notes (Signed)
Dr. Betti Cruz at bedside with interpreter phones to assess pt.

## 2012-10-31 NOTE — Consult Note (Signed)
Reason for Consult:ABM pain Referring Physician: [facility]  Daivd Perine HPI: This is a 72 year old male with a PMH of chronic abdominal pain.  The patient presented to the ER with complaints of abdominal pain that appeared to have worsened.  It was in the LUQ with radiation to the chest and the left arm.  He was evaluated by Cardiology without evidence of CAD, but he is to have stress test tomorrow.  In the past he was evaluated by both Drs. Renda Rolls with EGDs and colonoscopies without any etiology for his abdominal pain.  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 Loreta Ave.  During a prior evaluation by Dr. Marina Goodell he felt that it was secondary to anxiety and his abdominal pain was functional.  No reports of hematochezia or melena.  No reports of vomiting.  Past Medical History  Diagnosis Date  . GERD (gastroesophageal reflux disease)   . Hypertension   . Headache   . Neuromuscular disorder     periferal neuropathy per patient  . OSA (obstructive sleep apnea) 11/2009    sleep study with mild OSA, pt unwilling to use CPAP  . Anxiety     Past Surgical History  Procedure Date  . Cardiac catheterization 05/2019    to   . Cholecystectomy   . Colonoscopy 2012    Dr Loreta Ave    History reviewed. No pertinent family history.  Social History:  reports that he has never smoked. He has never used smokeless tobacco. He reports that he does not drink alcohol. His drug history not on file.  Allergies:  Allergies  Allergen Reactions  . Aspirin   . Vitamin C     Results for orders placed during the hospital encounter of 10/31/12 (from the past 24 hour(s))  CBC     Status: Abnormal   Collection Time   10/30/12 11:06 PM      Component Value Range   WBC 4.7  4.0 - 10.5 K/uL   RBC 3.86 (*) 4.22 - 5.81 MIL/uL   Hemoglobin 9.9 (*) 13.0 - 17.0 g/dL   HCT 16.1 (*) 09.6 - 04.5 %   MCV 80.3  78.0 - 100.0 fL   MCH 25.6 (*) 26.0 - 34.0 pg   MCHC  31.9  30.0 - 36.0 g/dL   RDW 40.9  81.1 - 91.4 %   Platelets 178  150 - 400 K/uL  BASIC METABOLIC PANEL     Status: Abnormal   Collection Time   10/30/12 11:06 PM      Component Value Range   Sodium 135  135 - 145 mEq/L   Potassium 4.1  3.5 - 5.1 mEq/L   Chloride 100  96 - 112 mEq/L   CO2 24  19 - 32 mEq/L   Glucose, Bld 103 (*) 70 - 99 mg/dL   BUN 17  6 - 23 mg/dL   Creatinine, Ser 7.82  0.50 - 1.35 mg/dL   Calcium 9.5  8.4 - 95.6 mg/dL   GFR calc non Af Amer 58 (*) >90 mL/min   GFR calc Af Amer 67 (*) >90 mL/min  POCT I-STAT TROPONIN I     Status: Normal   Collection Time   10/30/12 11:24 PM      Component Value Range   Troponin i, poc 0.00  0.00 - 0.08 ng/mL   Comment 3  URINALYSIS, ROUTINE W REFLEX MICROSCOPIC     Status: Normal   Collection Time   10/31/12  1:54 AM      Component Value Range   Color, Urine YELLOW  YELLOW   APPearance CLEAR  CLEAR   Specific Gravity, Urine 1.006  1.005 - 1.030   pH 6.5  5.0 - 8.0   Glucose, UA NEGATIVE  NEGATIVE mg/dL   Hgb urine dipstick NEGATIVE  NEGATIVE   Bilirubin Urine NEGATIVE  NEGATIVE   Ketones, ur NEGATIVE  NEGATIVE mg/dL   Protein, ur NEGATIVE  NEGATIVE mg/dL   Urobilinogen, UA 0.2  0.0 - 1.0 mg/dL   Nitrite NEGATIVE  NEGATIVE   Leukocytes, UA NEGATIVE  NEGATIVE  TROPONIN I     Status: Normal   Collection Time   10/31/12  1:57 AM      Component Value Range   Troponin I <0.30  <0.30 ng/mL  HEPATIC FUNCTION PANEL     Status: Normal   Collection Time   10/31/12  1:57 AM      Component Value Range   Total Protein 7.6  6.0 - 8.3 g/dL   Albumin 4.0  3.5 - 5.2 g/dL   AST 24  0 - 37 U/L   ALT 25  0 - 53 U/L   Alkaline Phosphatase 46  39 - 117 U/L   Total Bilirubin 0.3  0.3 - 1.2 mg/dL   Bilirubin, Direct <1.6  0.0 - 0.3 mg/dL   Indirect Bilirubin NOT CALCULATED  0.3 - 0.9 mg/dL  LIPASE, BLOOD     Status: Normal   Collection Time   10/31/12  1:57 AM      Component Value Range   Lipase 40  11 - 59 U/L  CK TOTAL AND CKMB      Status: Normal   Collection Time   10/31/12  1:57 AM      Component Value Range   Total CK 119  7 - 232 U/L   CK, MB 2.7  0.3 - 4.0 ng/mL   Relative Index 2.3  0.0 - 2.5  CG4 I-STAT (LACTIC ACID)     Status: Normal   Collection Time   10/31/12  2:27 AM      Component Value Range   Lactic Acid, Venous 1.95  0.5 - 2.2 mmol/L  TROPONIN I     Status: Normal   Collection Time   10/31/12  5:24 AM      Component Value Range   Troponin I <0.30  <0.30 ng/mL  LIPASE, BLOOD     Status: Normal   Collection Time   10/31/12  5:24 AM      Component Value Range   Lipase 40  11 - 59 U/L  CK TOTAL AND CKMB     Status: Normal   Collection Time   10/31/12  5:24 AM      Component Value Range   Total CK 119  7 - 232 U/L   CK, MB 2.7  0.3 - 4.0 ng/mL   Relative Index 2.3  0.0 - 2.5  HEPATIC FUNCTION PANEL     Status: Abnormal   Collection Time   10/31/12  5:24 AM      Component Value Range   Total Protein 7.6  6.0 - 8.3 g/dL   Albumin 4.0  3.5 - 5.2 g/dL   AST 24  0 - 37 U/L   ALT 25  0 - 53 U/L   Alkaline Phosphatase 46  39 -  117 U/L   Total Bilirubin 0.3  0.3 - 1.2 mg/dL   Bilirubin, Direct 0.1  0.0 - 0.3 mg/dL   Indirect Bilirubin 0.2 (*) 0.3 - 0.9 mg/dL  TROPONIN I     Status: Normal   Collection Time   10/31/12  9:04 AM      Component Value Range   Troponin I <0.30  <0.30 ng/mL  TROPONIN I     Status: Normal   Collection Time   10/31/12  4:44 PM      Component Value Range   Troponin I <0.30  <0.30 ng/mL     Dg Chest 2 View  10/30/2012  *RADIOLOGY REPORT*  Clinical Data: Chest pain and shortness of breath.  CHEST - 2 VIEW  Comparison: Chest radiograph performed 04/03/2011  Findings: The lungs are well-aerated and clear.  There is no evidence of focal opacification, pleural effusion or pneumothorax. Bilateral nipple shadows are noted.  The heart is normal in size; the mediastinal contour is within normal limits.  No acute osseous abnormalities are seen.  Clips are noted within the right upper  quadrant, reflecting prior cholecystectomy.  IMPRESSION: No acute cardiopulmonary process seen.   Original Report Authenticated By: Tonia Ghent, M.D.    Ct Abdomen Pelvis W Contrast  10/31/2012  *RADIOLOGY REPORT*  Clinical Data: Headache and bilateral arm pain; generalized body aches and abdominal pain.  Chest pain.  Diarrhea.  CT ABDOMEN AND PELVIS WITH CONTRAST  Technique:  Multidetector CT imaging of the abdomen and pelvis was performed following the standard protocol during bolus administration of intravenous contrast.  Contrast: OMNIPAQUE IOHEXOL 300 MG/ML  SOLN  Comparison: CT of the abdomen and pelvis performed 03/16/2010  Findings:  Minimal bibasilar atelectasis is noted.  The liver and spleen are unremarkable in appearance.  The patient is status post cholecystectomy, with clips noted along the gallbladder fossa.  The pancreas and adrenal glands are unremarkable in appearance.  Scattered hypodensities are noted within both kidneys, compatible with cysts, measuring up to 2.2 cm in size.  Mild right renal scarring is noted.  The kidneys are otherwise unremarkable.  There is no evidence of hydronephrosis.  No renal or ureteral stones are seen.  This could be perinephric stranding is appreciated.  No free fluid is identified.  The small bowel is unremarkable in appearance.  The stomach is within normal limits.  No acute vascular abnormalities are seen.  Mild intramural thrombus is noted along the abdominal aorta, without significant luminal narrowing. Relatively diffuse calcification is seen along the abdominal aorta and its branches.  The appendix is normal in caliber, without evidence for appendicitis.  The colon is largely filled with stool and is unremarkable in appearance; there is mild redundancy of the sigmoid colon.  The bladder is moderately distended and grossly unremarkable in appearance.  The prostate is enlarged, measuring 5.2 cm in transverse dimension, with nodular impression on the  base of the bladder.  No inguinal lymphadenopathy is seen.  No acute osseous abnormalities are identified.  IMPRESSION:  1.  No acute abnormality seen within the abdomen or pelvis. 2.  Mild intramural thrombus and relatively diffuse calcification along the abdominal aorta, without significant luminal narrowing. 3.  Scattered bilateral renal cysts seen; mild right renal scarring noted. 4.  Enlarged prostate noted, with nodular impression on the base of the bladder.  Would correlate with PSA.   Original Report Authenticated By: Tonia Ghent, M.D.     ROS:  As stated above in the HPI  otherwise negative.  Blood pressure 155/82, pulse 85, temperature 98.3 F (36.8 C), temperature source Oral, resp. rate 18, weight 148 lb 14.4 oz (67.541 kg), SpO2 99.00%.    PE: Gen: NAD, Alert and Oriented HEENT:  [place]/AT, EOMI Neck: Supple, no LAD Lungs: CTA Bilaterally CV: RRR without M/G/R ABM: Soft, mid abdominal tenderness, +BS Ext: No C/C/E  Assessment/Plan: 1) Chronic abdominal pain. 2) Chest pain.   I have reviewed is prior records and it appears to me that he had functional abdominal pain.  There is not acute intervention that is required by the GI service as his blood work and the imaging scans are all normal.    Plan: 1) Supportive care. 2) Hyoscyamine.  Carlisle Enke D 10/31/2012, 7:15 PM

## 2012-10-31 NOTE — Consult Note (Signed)
Pt. Seen and examined. Agree with the NP/PA-C note as written.   72 yo [place] man who was seen by [place] in 2011 for chest pain. Cardiac cath revealed normal coronaries. Although he has had similar pain for years, it was worse yesterday. Seems to be worse in the mid-epigastric area and is more painful with palpation. He also describes (through a translator) pain that goes from his head down his left arm and down both legs.  No ischemic EKG changes are noted. Troponin is negative x 3.  I agree with stress testing tomorrow, however, I'm fairly certain this is not cardiac. Pancreatic and liver enzymes are normal.  ?if this could be gastritis. Unclear what pain could go from the top of his head down his arms, however, he has relief with pain medication.   Chrystie Nose, MD, [facility] Attending Cardiologist [facility]

## 2012-10-31 NOTE — ED Notes (Signed)
Patient complaining of headache (chronic condition), bilateral arm pain (x 3 years), generalized body aches, abdominal pain (intermittent) and chest pain (started this evening).  Patient reports mild shortness of breath.  Denies syncopal episodes, nausea, and vomiting, but reports diarrhea (denies blood in stool).  Reports six episodes yesterday; less severe today.  Reports 7/10 abdominal pain.  Patient alert and oriented x4; PERRL present.  Interpreter phones used to translate.  Upon arrival to room, patient connected to continuous cardiac, pulse ox, and blood pressure monitor.  Will continue to monitor.

## 2012-10-31 NOTE — ED Notes (Signed)
Dr. Betti Cruz made aware pt is still c/o pain to chest/abdomen. States he will see pt and determine bed status.

## 2012-10-31 NOTE — ED Provider Notes (Signed)
History     CSN: [id number]  Arrival date & time 10/30/12  2250   First MD Initiated Contact with Patient 10/31/12 0033      Chief Complaint  Patient presents with  . Hypertension     Patient is a 72 y.o. male presenting with chest pain. The history is provided by the patient. A language interpreter was used (phone vietnamese interpreter).  Chest Pain Episode onset: earlier today. Chest pain occurs constantly. The chest pain is unchanged. Associated with: nothing. The severity of the pain is moderate. The quality of the pain is described as aching. The pain does not radiate. Exacerbated by: nothing. Primary symptoms include fatigue, shortness of breath, cough, abdominal pain and nausea. Pertinent negatives for primary symptoms include no syncope, no vomiting and no dizziness.  Associated symptoms include diaphoresis.  Pertinent negatives for associated symptoms include no weakness. Associated symptoms comments: Diarrhea . Treatments tried: rest.   pt reports multiples complaints.  He reports elevated blood pressure.  He reports left sided chest pain that started earlier today.  He also reports abd pain and recent nonbloody diarrhea that has resolved.  No focal weakness but reports his arms and legs are sore.  He reports chronic headaches that are essentially unchanged  Past Medical History  Diagnosis Date  . GERD (gastroesophageal reflux disease)   . Hypertension     Past Surgical History  Procedure Date  . Cardiac catheterization     History reviewed. No pertinent family history.  History  Substance Use Topics  . Smoking status: Never Smoker   . Smokeless tobacco: Not on file  . Alcohol Use: No      Review of Systems  Constitutional: Positive for diaphoresis and fatigue.  HENT: Positive for congestion.   Respiratory: Positive for cough and shortness of breath.   Cardiovascular: Positive for chest pain. Negative for syncope.  Gastrointestinal: Positive for nausea and  abdominal pain. Negative for vomiting.  Musculoskeletal: Positive for myalgias. Negative for back pain.  Skin: Negative for color change.  Neurological: Positive for headaches. Negative for dizziness and weakness.  Psychiatric/Behavioral: Negative for agitation.  All other systems reviewed and are negative.    Allergies  Aspirin and Vitamin c  Home Medications  No current outpatient prescriptions on file.  BP 133/87  Pulse 73  Temp 98 F (36.7 C)  Resp 18  SpO2 100%  Physical Exam CONSTITUTIONAL: Well developed/well nourished, diaphoretic but in no distress HEAD AND FACE: Normocephalic/atraumatic EYES: EOMI/PERRL ENMT: Mucous membranes moist NECK: supple no meningeal signs SPINE:entire spine nontender CV: S1/S2 noted, no murmurs/rubs/gallops noted LUNGS: Lungs are clear to auscultation bilaterally, no apparent distress ABDOMEN: soft, mild diffuse tenderness, no rebound or guarding GU:no cva tenderness NEURO: Pt is awake/alert, moves all extremitiesx4. No arm/leg drift.  No facial droop.  He is unable to stand due to dizziness.   EXTREMITIES: pulses normal, full ROM, no discoloration noted of his extremities SKIN: warm, color normal PSYCH: no abnormalities of mood noted  ED Course  Procedures    Labs Reviewed  CBC  BASIC METABOLIC PANEL  CULTURE, BLOOD (ROUTINE X 2)  CULTURE, BLOOD (ROUTINE X 2)  TROPONIN I  LIPASE, BLOOD  URINALYSIS, ROUTINE W REFLEX MICROSCOPIC  URINE CULTURE  CK TOTAL AND CKMB  HEPATIC FUNCTION PANEL   Dg Chest 2 View  10/30/2012  *RADIOLOGY REPORT*  Clinical Data: Chest pain and shortness of breath.  CHEST - 2 VIEW  Comparison: Chest radiograph performed 04/03/2011  Findings: The lungs are  well-aerated and clear.  There is no evidence of focal opacification, pleural effusion or pneumothorax. Bilateral nipple shadows are noted.  The heart is normal in size; the mediastinal contour is within normal limits.  No acute osseous abnormalities are  seen.  Clips are noted within the right upper quadrant, reflecting prior cholecystectomy.  IMPRESSION: No acute cardiopulmonary process seen.   Original Report Authenticated By: Tonia Ghent, M.D.     1:54 AM Pt is difficult historian even with vietnamese phone interpreter.  His main issues today are left sided chest pain and abdominal pain.  He also reports body aches.  He is diaphoretic but does not appear in distress.  No focal weakness noted.  Will focus workup on chest pain and his abdominal pain.  Will follow closely 3:27 AM Pt now with diffuse abdominal tenderness, mostly in left upper quadrant Stat lactate 1.95 Will obtain CT imaging 7:26 AM Ct imaging does not show any acute process Will admit for chest pain/sob.  He does have h/o CAD per cath report in 2010.  Initial markers/ekg unremarkable D/w dr reddy, [facility] to admit   MDM  Nursing notes including past medical history and social history reviewed and considered in documentation Labs/vital reviewed and considered xrays reviewed and considered        Date: 10/31/2012  Rate: 68  Rhythm: normal sinus rhythm  QRS Axis: normal  Intervals: PR prolonged  ST/T Wave abnormalities: nonspecific ST changes  Conduction Disutrbances:first-degree A-V block      Joya Gaskins, MD 10/31/12 [phone]

## 2012-10-31 NOTE — ED Notes (Signed)
Phlebotomy at bedside to collect blood specimens.

## 2012-10-31 NOTE — Care Management Note (Unsigned)
    Page 1 of 1   10/31/2012     1:57:43 PM   CARE MANAGEMENT NOTE 10/31/2012  Patient:  Omar Sanders, Omar Sanders   Account Number:  [id number]  Date Initiated:  10/31/2012  Documentation initiated by:  GRAVES-BIGELOW,Bre P[place]  Subjective/Objective Assessment:   [place] admitted with cp.     Action/Plan:   CM will continue to monitor for disposition needs.   Anticipated DC Date:  11/01/2012   Anticipated DC Plan:  HOME/SELF CARE      DC Planning Services  CM consult      Choice offered to / List presented to:             Status of service:  In process, will continue to follow Medicare Important Message given?   (If response is "NO", the following Medicare IM given date fields will be blank) Date Medicare IM given:   Date Additional Medicare IM given:    Discharge Disposition:    Per UR Regulation:  Reviewed for med. necessity/level of care/duration of stay  If discussed at Long Length of Stay Meetings, dates discussed:    Comments:

## 2012-11-01 ENCOUNTER — Encounter (HOSPITAL_COMMUNITY): Payer: PRIVATE HEALTH INSURANCE

## 2012-11-01 ENCOUNTER — Observation Stay (HOSPITAL_COMMUNITY): Payer: PRIVATE HEALTH INSURANCE

## 2012-11-01 DIAGNOSIS — I10 Essential (primary) hypertension: Secondary | ICD-10-CM

## 2012-11-01 DIAGNOSIS — R109 Unspecified abdominal pain: Secondary | ICD-10-CM

## 2012-11-01 DIAGNOSIS — J309 Allergic rhinitis, unspecified: Secondary | ICD-10-CM

## 2012-11-01 DIAGNOSIS — D649 Anemia, unspecified: Secondary | ICD-10-CM

## 2012-11-01 HISTORY — PX: NM MYOCAR PERF WALL MOTION: HXRAD629

## 2012-11-01 LAB — LIPID PANEL
Cholesterol: 202 mg/dL — ABNORMAL HIGH (ref 0–200)
HDL: 45 mg/dL (ref >39)
LDL Cholesterol: 142 mg/dL — ABNORMAL HIGH (ref 0–99)
Total CHOL/HDL Ratio: 4.5 RATIO
Triglycerides: 75 mg/dL (ref <150)
VLDL: 15 mg/dL (ref 0–40)

## 2012-11-01 LAB — CBC
HCT: 30.9 % — ABNORMAL LOW (ref 39.0–52.0)
Hemoglobin: 10.2 g/dL — ABNORMAL LOW (ref 13.0–17.0)
MCH: 26.2 pg (ref 26.0–34.0)
MCHC: 33 g/dL (ref 30.0–36.0)
MCV: 79.2 fL (ref 78.0–100.0)
Platelets: 183 10*3/uL (ref 150–400)
RBC: 3.9 MIL/uL — ABNORMAL LOW (ref 4.22–5.81)
RDW: 14 % (ref 11.5–15.5)
WBC: 4.8 10*3/uL (ref 4.0–10.5)

## 2012-11-01 LAB — BASIC METABOLIC PANEL
BUN: 13 mg/dL (ref 6–23)
CO2: 25 mEq/L (ref 19–32)
Calcium: 9 mg/dL (ref 8.4–10.5)
Chloride: 98 mEq/L (ref 96–112)
Creatinine, Ser: 1.29 mg/dL (ref 0.50–1.35)
GFR calc Af Amer: 62 mL/min — ABNORMAL LOW (ref >90)
GFR calc non Af Amer: 53 mL/min — ABNORMAL LOW (ref >90)
Glucose, Bld: 118 mg/dL — ABNORMAL HIGH (ref 70–99)
Potassium: 3.6 mEq/L (ref 3.5–5.1)
Sodium: 133 mEq/L — ABNORMAL LOW (ref 135–145)

## 2012-11-01 LAB — URINE CULTURE
Colony Count: NO GROWTH
Culture: NO GROWTH

## 2012-11-01 LAB — IRON AND TIBC
Iron: 58 ug/dL (ref 42–135)
Saturation Ratios: 22 % (ref 20–55)
TIBC: 265 ug/dL (ref 215–435)
UIBC: 207 ug/dL (ref 125–400)

## 2012-11-01 LAB — RETICULOCYTES
RBC.: 3.9 MIL/uL — ABNORMAL LOW (ref 4.22–5.81)
Retic Count, Absolute: 46.8 10*3/uL (ref 19.0–186.0)
Retic Ct Pct: 1.2 % (ref 0.4–3.1)

## 2012-11-01 LAB — FOLATE: Folate: 9.5 ng/mL

## 2012-11-01 LAB — FERRITIN: Ferritin: 261 ng/mL (ref 22–322)

## 2012-11-01 LAB — VITAMIN B12: Vitamin B-12: 585 pg/mL (ref 211–911)

## 2012-11-01 MED ORDER — TECHNETIUM TC 99M SESTAMIBI GENERIC - CARDIOLITE
10.0000 | Freq: Once | INTRAVENOUS | Status: AC | PRN
  Administered 2012-11-01: 10 via INTRAVENOUS

## 2012-11-01 MED ORDER — PROMETHAZINE HCL 25 MG/ML IJ SOLN
12.5000 mg | Freq: Four times a day (QID) | INTRAMUSCULAR | Status: DC | PRN
  Filled 2012-11-01 (×2): qty 1

## 2012-11-01 MED ORDER — TECHNETIUM TC 99M SESTAMIBI - CARDIOLITE
30.0000 | Freq: Once | INTRAVENOUS | Status: AC | PRN
  Administered 2012-11-01: 30 via INTRAVENOUS

## 2012-11-01 NOTE — Discharge Summary (Signed)
Physician Discharge Summary  Omar Sanders BJY:[id number] DOB: 1940-12-21 DOA: 10/31/2012  PCP: Jearld Lesch, MD  Admit date: 10/31/2012 Discharge date: 11/01/2012  Time spent: 40 minutes  Recommendations for Outpatient Follow-up:  1. Recommend PCP follow up in 7-10 days 2. Recommend follow up Dr Elnoria Howard 2 weeks  Discharge Diagnoses:  Principal Problem:  *Chest pain Active Problems:  DYSLIPIDEMIA  HYPERTENSION, ESSENTIAL NOS  GERD  Irritable bowel syndrome  ABDOMINAL PAIN -GENERALIZED  Anemia   Discharge Condition: stable  Diet recommendation: regular  Filed Weights   10/31/12 1052 11/01/12 0500  Weight: 67.541 kg (148 lb 14.4 oz) 66.724 kg (147 lb 1.6 oz)    History of present illness:  Omar Sanders is a 72 y.o. [place] male who does not speak English and history was obtained via telephone interpreter who presented with cc of chest pain and abdominal pain. Patient has a history of GERD, hypertension, chronic abdominal pain, coronary artery disease status post last cardiac catheter in 2011 which showed a mild to moderate coronary disease with normal LV function. Patient reported that he had been having left-sided abdominal and chest pain for many years. He has had extensive workup for his abdominal pain with multiple endoscopies, most recent on 06/20/2010 which showed a normal endoscopy. He also has been having left-sided chest pain again for the last few years. He presented to the emergency department for further evaluation. Patient indicated that his chest pain has been 6-7 to 10/10 at home. Not sure what makes it better or what makes it worse. He also is not sure what makes the abdominal pain better or worse. In the emergency department and was found to be slightly diaphoretic as a result the hospitalist service was consult and for further care and management. Has not had any fevers or chills at home. Denied any shortness of breath. Did have intermittent episodes of diarrhea and  constipation. He did report intermittently coughing chronically which is nonproductive. Denied any cough.    Hospital Course:  Chest pain  No events on telemetry. Cardiac enzymes neg.  EKG not concerning for any ischemic changes, chest x-ray shows no acute cardiopulmonary process. Given chronic nature of his chest pain evaluated by cards who scheduled stress test. Results yield EF 70%. Cards opines that  pain not likely cardiac related. Appreciate their input. Lipid panel yields cholesterol 202 and LDL 142.  Chronic abdominal pain  Patient had CT of the abdomen and pelvis with contrast on 10/31/2012 which showed no acute abnormality for abdominal pain, mild intramural thrombus and relatively diffuse calcification along the abdominal aorta. Seen by Dr. Daiva Eves with GI who recommends continued supportive care and follow up OP 1-2 weeks. Will continue hyoscyamine  Hyperlipidemia  Chol 202 and LDL 142.  Hypertension  Fair control. Continue lisinopril and furosemide.  Depression  Stable at baseline. Continue Lexapro and quetiapine. Patient to followup with his primary care physician for further care and management.  GERD/irritable bowel syndrome?  Continue PPI, lubiprostone, and dicyclomine. OP follow up with GI 1-2 weeks  Anemia  anemia panel yields RBC 3.9 otherwise within normal limits. Chart review indicates baseline range 11.3-11.6. HG 10.2 currently. No s/sx active bleeding. Recommend OP follow up.      Procedures:  Stress test  Consultations:  Cardiology  GI  Discharge Exam: Filed Vitals:   11/01/12 1110 11/01/12 1112 11/01/12 1114 11/01/12 1317  BP: 127/67 128/68 128/72 119/67  Pulse:      Temp:      TempSrc:  Resp:      Weight:      SpO2:        General: awake alert oriented x3 Cardiovascular: RRR N MGR No LEE Respiratory: normal effort BSCTAB No wheeze  Discharge Instructions  Discharge Orders    Future Orders Please Complete By Expires   Diet - low sodium  heart healthy      Increase activity slowly      Call MD for:  severe uncontrolled pain      Call MD for:  difficulty breathing, headache or visual disturbances      Call MD for:  extreme fatigue          Medication List     As of 11/01/2012  1:37 PM    TAKE these medications         diclofenac sodium 1 % Gel   Commonly known as: VOLTAREN   Apply 2 g topically daily as needed.      dicyclomine 20 MG tablet   Commonly known as: BENTYL   Take 20 mg by mouth 3 (three) times daily.      escitalopram 10 MG tablet   Commonly known as: LEXAPRO   Take 10 mg by mouth daily.      furosemide 20 MG tablet   Commonly known as: LASIX   Take 20 mg by mouth daily.      lansoprazole 30 MG capsule   Commonly known as: PREVACID   Take 30 mg by mouth daily.      lisinopril 20 MG tablet   Commonly known as: PRINIVIL,ZESTRIL   Take 20 mg by mouth daily.      lubiprostone 24 MCG capsule   Commonly known as: AMITIZA   Take 24 mcg by mouth daily with breakfast.      meloxicam 7.5 MG tablet   Commonly known as: MOBIC   Take 7.5 mg by mouth 2 (two) times daily.      SEROQUEL XR 150 MG 24 hr tablet   Generic drug: QUEtiapine Fumarate   Take 300 mg by mouth at bedtime.      traZODone 50 MG tablet   Commonly known as: DESYREL   Take 50 mg by mouth at bedtime.      triazolam 0.25 MG tablet   Commonly known as: HALCION   Take 0.25 mg by mouth at bedtime as needed.           Follow-up Information    Follow up with Jearld Lesch, MD. Schedule an appointment as soon as possible for a visit in 1 week.   Contact information:   [address] [place] [address] [phone]       Follow up with HUNG,PATRICK D, MD. Schedule an appointment as soon as possible for a visit in 2 weeks.   Contact information:   [address] [place] [address] [phone]           The results of significant diagnostics from this hospitalization (including imaging,  microbiology, ancillary and laboratory) are listed below for reference.    Significant Diagnostic Studies: Dg Chest 2 View  10/30/2012  *RADIOLOGY REPORT*  Clinical Data: Chest pain and shortness of breath.  CHEST - 2 VIEW  Comparison: Chest radiograph performed 04/03/2011  Findings: The lungs are well-aerated and clear.  There is no evidence of focal opacification, pleural effusion or pneumothorax. Bilateral nipple shadows are noted.  The heart is normal in size; the mediastinal contour is within normal limits.  No acute osseous abnormalities are seen.  Clips are noted within the right upper quadrant, reflecting prior cholecystectomy.  IMPRESSION: No acute cardiopulmonary process seen.   Original Report Authenticated By: Tonia Ghent, M.D.    Ct Abdomen Pelvis W Contrast  10/31/2012  *RADIOLOGY REPORT*  Clinical Data: Headache and bilateral arm pain; generalized body aches and abdominal pain.  Chest pain.  Diarrhea.  CT ABDOMEN AND PELVIS WITH CONTRAST  Technique:  Multidetector CT imaging of the abdomen and pelvis was performed following the standard protocol during bolus administration of intravenous contrast.  Contrast: OMNIPAQUE IOHEXOL 300 MG/ML  SOLN  Comparison: CT of the abdomen and pelvis performed 03/16/2010  Findings:  Minimal bibasilar atelectasis is noted.  The liver and spleen are unremarkable in appearance.  The patient is status post cholecystectomy, with clips noted along the gallbladder fossa.  The pancreas and adrenal glands are unremarkable in appearance.  Scattered hypodensities are noted within both kidneys, compatible with cysts, measuring up to 2.2 cm in size.  Mild right renal scarring is noted.  The kidneys are otherwise unremarkable.  There is no evidence of hydronephrosis.  No renal or ureteral stones are seen.  This could be perinephric stranding is appreciated.  No free fluid is identified.  The small bowel is unremarkable in appearance.  The stomach is within normal  limits.  No acute vascular abnormalities are seen.  Mild intramural thrombus is noted along the abdominal aorta, without significant luminal narrowing. Relatively diffuse calcification is seen along the abdominal aorta and its branches.  The appendix is normal in caliber, without evidence for appendicitis.  The colon is largely filled with stool and is unremarkable in appearance; there is mild redundancy of the sigmoid colon.  The bladder is moderately distended and grossly unremarkable in appearance.  The prostate is enlarged, measuring 5.2 cm in transverse dimension, with nodular impression on the base of the bladder.  No inguinal lymphadenopathy is seen.  No acute osseous abnormalities are identified.  IMPRESSION:  1.  No acute abnormality seen within the abdomen or pelvis. 2.  Mild intramural thrombus and relatively diffuse calcification along the abdominal aorta, without significant luminal narrowing. 3.  Scattered bilateral renal cysts seen; mild right renal scarring noted. 4.  Enlarged prostate noted, with nodular impression on the base of the bladder.  Would correlate with PSA.   Original Report Authenticated By: Tonia Ghent, M.D.    Nm Myocar Multi W/spect W/wall Motion / Ef  11/01/2012  *RADIOLOGY REPORT*  Clinical Data:  Chest pain  MYOCARDIAL IMAGING WITH SPECT (REST AND PHARMACOLOGIC-STRESS) GATED LEFT VENTRICULAR WALL MOTION STUDY LEFT VENTRICULAR EJECTION FRACTION  Technique:  Standard myocardial SPECT imaging was performed after resting intravenous injection of 10 mCi Tc-50m sestamibi. Subsequently, intravenous infusion of lexiscan was performed under the supervision of the Cardiology staff.  At peak effect of the drug, 30 mCi Tc-10m sestamibi was injected intravenously and standard myocardial SPECT  imaging was performed.  Quantitative gated imaging was also performed to evaluate left ventricular wall motion, and estimate left ventricular ejection fraction.  Comparison:  Chest radiograph -  10/30/2012  Findings:  Review of the rotational raw images demonstrates grossly symmetric GI activity.  No significant patient motion artifact.  SPECT imaging demonstrates homogeneous distribution of injector radiotracer.  No scintigraphic evidence of prior infarction pharmacologically induced ischemia.  Quantitative gated analysis shows normal motion.  The resting left ventricular ejection fraction is 71% with end- diastolic volume of 79 ml and end-systolic volume of 23  ml.  IMPRESSION: 1.  No scintigraphic evidence of prior infarction or pharmacologically induced ischemia. 2.  Normal wall motion.  Ejection fraction - 71%.   Original Report Authenticated By: Tacey Ruiz, MD     Microbiology: Recent Results (from the past 240 hour(s))  CULTURE, BLOOD (ROUTINE X 2)     Status: Normal (Preliminary result)   Collection Time   10/31/12  1:40 AM      Component Value Range Status Comment   Specimen Description BLOOD LEFT ARM   Final    Special Requests BOTTLES DRAWN AEROBIC AND ANAEROBIC 10CC EACH   Final    Culture  Setup Time 10/31/2012 08:49   Final    Culture     Final    Value:        BLOOD CULTURE RECEIVED NO GROWTH TO DATE CULTURE WILL BE HELD FOR 5 DAYS BEFORE ISSUING A FINAL NEGATIVE REPORT   Report Status PENDING   Incomplete   CULTURE, BLOOD (ROUTINE X 2)     Status: Normal (Preliminary result)   Collection Time   10/31/12  1:50 AM      Component Value Range Status Comment   Specimen Description BLOOD RIGHT HAND   Final    Special Requests BOTTLES DRAWN AEROBIC ONLY 10CC   Final    Culture  Setup Time 10/31/2012 08:49   Final    Culture     Final    Value:        BLOOD CULTURE RECEIVED NO GROWTH TO DATE CULTURE WILL BE HELD FOR 5 DAYS BEFORE ISSUING A FINAL NEGATIVE REPORT   Report Status PENDING   Incomplete   URINE CULTURE     Status: Normal   Collection Time   10/31/12  1:54 AM      Component Value Range Status Comment   Specimen Description URINE, RANDOM   Final    Special Requests  NONE   Final    Culture  Setup Time 10/31/2012 02:11   Final    Colony Count NO GROWTH   Final    Culture NO GROWTH   Final    Report Status 11/01/2012 FINAL   Final      Labs: Basic Metabolic Panel:  Lab 11/01/12 1610 10/30/12 2306  NA 133* 135  K 3.6 4.1  CL 98 100  CO2 25 24  GLUCOSE 118* 103*  BUN 13 17  CREATININE 1.29 1.20  CALCIUM 9.0 9.5  MG -- --  PHOS -- --   Liver Function Tests:  Lab 10/31/12 0524 10/31/12 0157  AST 24 24  ALT 25 25  ALKPHOS 46 46  BILITOT 0.3 0.3  PROT 7.6 7.6  ALBUMIN 4.0 4.0    Lab 10/31/12 0524 10/31/12 0157  LIPASE 40 40  AMYLASE -- --   No results found for this basename: AMMONIA:5 in the last 168 hours CBC:  Lab 11/01/12 0540 10/30/12 2306  WBC 4.8 4.7  NEUTROABS -- --  HGB 10.2* 9.9*  HCT 30.9* 31.0*  MCV 79.2 80.3  PLT 183 178   Cardiac Enzymes:  Lab 10/31/12 1644 10/31/12 0904 10/31/12 0524 10/31/12 0157  CKTOTAL -- -- 119 119  CKMB -- -- 2.7 2.7  CKMBINDEX -- -- -- --  TROPONINI <0.30 <0.30 <0.30 <0.30   BNP: BNP (last 3 results) No results found for this basename: PROBNP:3 in the last 8760 hours CBG: No results found for this basename: GLUCAP:5 in the last 168 hours     Signed:  [facility]  [facility] 11/01/2012, 1:37 PM

## 2012-11-01 NOTE — Progress Notes (Signed)
Subjective: Mild chest discomfort this am.  Legs feel better.  Objective: Vital signs in last 24 hours: Temp:  [97.6 F (36.4 C)-98.3 F (36.8 C)] 97.6 F (36.4 C) (01/03 0500) Pulse Rate:  [67-89] 67  (01/03 0500) Resp:  [18] 18  (01/03 0500) BP: (142-158)/(82-91) 142/83 mmHg (01/03 0500) SpO2:  [98 %-100 %] 100 % (01/03 0500) Weight:  [66.724 kg (147 lb 1.6 oz)-67.541 kg (148 lb 14.4 oz)] 66.724 kg (147 lb 1.6 oz) (01/03 0500) Weight change:    Intake/Output from previous day: -575 01/02 0701 - 01/03 0700 In: 150 [P.O.:150] Out: 725 [Urine:725] Intake/Output this shift: Total I/O In: -  Out: 300 [Urine:300]  PE: General:alert and oriented, interpreter at bedside Heart:S1S2 RRR, 1st degree AV block. Lungs:clear without rales, rhonchi of wheezes Abd:+ BS, soft non tender Ext:no edema, 2 + pedal pulses    Lab Results:  Basename 11/01/12 0540 10/30/12 2306  WBC 4.8 4.7  HGB 10.2* 9.9*  HCT 30.9* 31.0*  PLT 183 178   BMET  Basename 11/01/12 0540 10/30/12 2306  NA 133* 135  K 3.6 4.1  CL 98 100  CO2 25 24  GLUCOSE 118* 103*  BUN 13 17  CREATININE 1.29 1.20  CALCIUM 9.0 9.5    Basename 10/31/12 1644 10/31/12 0904  TROPONINI <0.30 <0.30    Lab Results  Component Value Date   CHOL 202* 11/01/2012   HDL 45 11/01/2012   LDLCALC 119* 11/01/2012   TRIG 75 11/01/2012   CHOLHDL 4.5 11/01/2012     Hepatic Function Panel  Basename 10/31/12 0524  PROT 7.6  ALBUMIN 4.0  AST 24  ALT 25  ALKPHOS 46  BILITOT 0.3  BILIDIR 0.1  IBILI 0.2*    Basename 11/01/12 0540  CHOL 202*     Studies/Results: Dg Chest 2 View  10/30/2012  *RADIOLOGY REPORT*  Clinical Data: Chest pain and shortness of breath.  CHEST - 2 VIEW  Comparison: Chest radiograph performed 04/03/2011  Findings: The lungs are well-aerated and clear.  There is no evidence of focal opacification, pleural effusion or pneumothorax. Bilateral nipple shadows are noted.  The heart is normal in size; the  mediastinal contour is within normal limits.  No acute osseous abnormalities are seen.  Clips are noted within the right upper quadrant, reflecting prior cholecystectomy.  IMPRESSION: No acute cardiopulmonary process seen.   Original Report Authenticated By: Tonia Ghent, M.D.    Ct Abdomen Pelvis W Contrast  10/31/2012  *RADIOLOGY REPORT*  Clinical Data: Headache and bilateral arm pain; generalized body aches and abdominal pain.  Chest pain.  Diarrhea.  CT ABDOMEN AND PELVIS WITH CONTRAST  Technique:  Multidetector CT imaging of the abdomen and pelvis was performed following the standard protocol during bolus administration of intravenous contrast.  Contrast: OMNIPAQUE IOHEXOL 300 MG/ML  SOLN  Comparison: CT of the abdomen and pelvis performed 03/16/2010  Findings:  Minimal bibasilar atelectasis is noted.  The liver and spleen are unremarkable in appearance.  The patient is status post cholecystectomy, with clips noted along the gallbladder fossa.  The pancreas and adrenal glands are unremarkable in appearance.  Scattered hypodensities are noted within both kidneys, compatible with cysts, measuring up to 2.2 cm in size.  Mild right renal scarring is noted.  The kidneys are otherwise unremarkable.  There is no evidence of hydronephrosis.  No renal or ureteral stones are seen.  This could be perinephric stranding is appreciated.  No free fluid is identified.  The small bowel  is unremarkable in appearance.  The stomach is within normal limits.  No acute vascular abnormalities are seen.  Mild intramural thrombus is noted along the abdominal aorta, without significant luminal narrowing. Relatively diffuse calcification is seen along the abdominal aorta and its branches.  The appendix is normal in caliber, without evidence for appendicitis.  The colon is largely filled with stool and is unremarkable in appearance; there is mild redundancy of the sigmoid colon.  The bladder is moderately distended and grossly  unremarkable in appearance.  The prostate is enlarged, measuring 5.2 cm in transverse dimension, with nodular impression on the base of the bladder.  No inguinal lymphadenopathy is seen.  No acute osseous abnormalities are identified.  IMPRESSION:  1.  No acute abnormality seen within the abdomen or pelvis. 2.  Mild intramural thrombus and relatively diffuse calcification along the abdominal aorta, without significant luminal narrowing. 3.  Scattered bilateral renal cysts seen; mild right renal scarring noted. 4.  Enlarged prostate noted, with nodular impression on the base of the bladder.  Would correlate with PSA.   Original Report Authenticated By: Tonia Ghent, M.D.     Medications: I have reviewed the patient's current medications.    . dicyclomine  20 mg Oral TID  . enoxaparin (LOVENOX) injection  40 mg Subcutaneous Q24H  . escitalopram  10 mg Oral Daily  . furosemide  20 mg Oral Daily  . lisinopril  20 mg Oral Daily  . lubiprostone  24 mcg Oral Q breakfast  . pantoprazole  40 mg Oral Daily  . QUEtiapine Fumarate  300 mg Oral QHS  . regadenoson  0.4 mg Intravenous Once  . traZODone  50 mg Oral QHS   Assessment/Plan: Principal Problem:  *Chest pain Active Problems:  DYSLIPIDEMIA  HYPERTENSION, ESSENTIAL NOS  GERD  Irritable bowel syndrome  ABDOMINAL PAIN -GENERALIZED  Anemia  PLAN: for nuc. Stress test today.  Cardiac enzymes negative. Enlarged prostate on CT of pelvis  Addendum:  Lexiscan myoview completed without complications.   No chest pain.  EKG with 1 d AV block only.    LOS: 1 day   INGOLD,LAURA R 11/01/2012, 10:21 AM

## 2012-11-01 NOTE — Progress Notes (Signed)
Request for Interpreter has been placed.     Angelia Mould, MSW, [place] [phone]

## 2012-11-01 NOTE — Progress Notes (Signed)
MD on call notified of pt complaining of being nauseous, dizzy, & blurry vision. Pt also complaining of abd pain still. Pt states vision as well as dizziness are improving but stomach pain still the same. 12.5 mg of phenergen was ordered. Will continue to monitor the pt. Sanda Linger

## 2012-11-01 NOTE — Progress Notes (Signed)
[facility] PROGRESS NOTE  Othal Kubitz WUJ:[id number] DOB: 1941-02-23 DOA: 10/31/2012 PCP: Jearld Lesch, MD  Assessment/Plan: Chest pain  Etiology unclear. No events on telemetry. Cardiac enzymes neg to date. EKG not concerning for any ischemic changes, chest x-ray shows no acute cardiopulmonary process. Given chronic nature of his chest pain evaluated by cards who scheduled stress test today. Cards opines that not pain not likely cardiac related.  Appreciate their input.  Lipid panel yields cholesterol 202 and LDL 142.  Chronic abdominal pain   Patient had CT of the abdomen and pelvis with contrast on 10/31/2012 which showed no acute abnormality for abdominal pain, mild intramural thrombus and relatively diffuse calcification along the abdominal aorta. Seen by Dr. Daiva Eves with GI who recommends continued supportive care and follow up OP 1-2 weeks. Will continue hyoscyamine  Hyperlipidemia  Chol 202 and LDL 142.   Hypertension   Fair control. Continue lisinopril and furosemide.  Depression   Stable at baseline. Continue Lexapro and quetiapine. Patient to followup with his primary care physician for further care and management.  GERD/irritable bowel syndrome?  Continue PPI, lubiprostone, and dicyclomine. OP follow up with GI 1-2 weeks  Anemia  anemia pane pending.  Chart review indicates baseline range 11.3-11.6. HG 10.2 currently. No s/sx active bleeding.   Code Status: full Family Communication:  Disposition Plan: stress test today, possible discharge if cleared this afternoon   Consultants:  Cardiology  GI  Procedures:  Stress test 11/01/12  Antibiotics:  none  HPI/Subjective: Lying in bed awake, smiling. Indicates abdominal pain "reduced,better". NAD  Objective: Filed Vitals:   10/31/12 1052 10/31/12 1458 10/31/12 2100 11/01/12 0500  BP: 143/91 155/82 158/83 142/83  Pulse: 89 85 82 67  Temp: 97.9 F (36.6 C) 98.3 F (36.8 C) 97.8 F (36.6 C) 97.6 F (36.4 C)    TempSrc:   Oral Oral  Resp: 18 18 18 18   Weight: 67.541 kg (148 lb 14.4 oz)   66.724 kg (147 lb 1.6 oz)  SpO2: 98% 99% 99% 100%    Intake/Output Summary (Last 24 hours) at 11/01/12 9562 Last data filed at 11/01/12 0500  Gross per 24 hour  Intake    150 ml  Output    725 ml  Net   -575 ml   Filed Weights   10/31/12 1052 11/01/12 0500  Weight: 67.541 kg (148 lb 14.4 oz) 66.724 kg (147 lb 1.6 oz)    Exam:   General:  Awake alert NAD  Cardiovascular: RRR No MGR No LEE PPP  Respiratory: normal effort BSCTAB No wheeze/rhonchi  Abdomen: soft +BS non-tender to palpation  Data Reviewed: Basic Metabolic Panel:  Lab 11/01/12 1308 10/30/12 2306  NA 133* 135  K 3.6 4.1  CL 98 100  CO2 25 24  GLUCOSE 118* 103*  BUN 13 17  CREATININE 1.29 1.20  CALCIUM 9.0 9.5  MG -- --  PHOS -- --   Liver Function Tests:  Lab 10/31/12 0524 10/31/12 0157  AST 24 24  ALT 25 25  ALKPHOS 46 46  BILITOT 0.3 0.3  PROT 7.6 7.6  ALBUMIN 4.0 4.0    Lab 10/31/12 0524 10/31/12 0157  LIPASE 40 40  AMYLASE -- --   No results found for this basename: AMMONIA:5 in the last 168 hours CBC:  Lab 11/01/12 0540 10/30/12 2306  WBC 4.8 4.7  NEUTROABS -- --  HGB 10.2* 9.9*  HCT 30.9* 31.0*  MCV 79.2 80.3  PLT 183 178   Cardiac Enzymes:  Lab 10/31/12 1644 10/31/12 0904 10/31/12 0524 10/31/12 0157  CKTOTAL -- -- 119 119  CKMB -- -- 2.7 2.7  CKMBINDEX -- -- -- --  TROPONINI <0.30 <0.30 <0.30 <0.30   BNP (last 3 results) No results found for this basename: PROBNP:3 in the last 8760 hours CBG: No results found for this basename: GLUCAP:5 in the last 168 hours  Recent Results (from the past 240 hour(s))  URINE CULTURE     Status: Normal   Collection Time   10/31/12  1:54 AM      Component Value Range Status Comment   Specimen Description URINE, RANDOM   Final    Special Requests NONE   Final    Culture  Setup Time 10/31/2012 02:11   Final    Colony Count NO GROWTH   Final    Culture  NO GROWTH   Final    Report Status 11/01/2012 FINAL   Final      Studies: Dg Chest 2 View  10/30/2012  *RADIOLOGY REPORT*  Clinical Data: Chest pain and shortness of breath.  CHEST - 2 VIEW  Comparison: Chest radiograph performed 04/03/2011  Findings: The lungs are well-aerated and clear.  There is no evidence of focal opacification, pleural effusion or pneumothorax. Bilateral nipple shadows are noted.  The heart is normal in size; the mediastinal contour is within normal limits.  No acute osseous abnormalities are seen.  Clips are noted within the right upper quadrant, reflecting prior cholecystectomy.  IMPRESSION: No acute cardiopulmonary process seen.   Original Report Authenticated By: Tonia Ghent, M.D.    Ct Abdomen Pelvis W Contrast  10/31/2012  *RADIOLOGY REPORT*  Clinical Data: Headache and bilateral arm pain; generalized body aches and abdominal pain.  Chest pain.  Diarrhea.  CT ABDOMEN AND PELVIS WITH CONTRAST  Technique:  Multidetector CT imaging of the abdomen and pelvis was performed following the standard protocol during bolus administration of intravenous contrast.  Contrast: OMNIPAQUE IOHEXOL 300 MG/ML  SOLN  Comparison: CT of the abdomen and pelvis performed 03/16/2010  Findings:  Minimal bibasilar atelectasis is noted.  The liver and spleen are unremarkable in appearance.  The patient is status post cholecystectomy, with clips noted along the gallbladder fossa.  The pancreas and adrenal glands are unremarkable in appearance.  Scattered hypodensities are noted within both kidneys, compatible with cysts, measuring up to 2.2 cm in size.  Mild right renal scarring is noted.  The kidneys are otherwise unremarkable.  There is no evidence of hydronephrosis.  No renal or ureteral stones are seen.  This could be perinephric stranding is appreciated.  No free fluid is identified.  The small bowel is unremarkable in appearance.  The stomach is within normal limits.  No acute vascular  abnormalities are seen.  Mild intramural thrombus is noted along the abdominal aorta, without significant luminal narrowing. Relatively diffuse calcification is seen along the abdominal aorta and its branches.  The appendix is normal in caliber, without evidence for appendicitis.  The colon is largely filled with stool and is unremarkable in appearance; there is mild redundancy of the sigmoid colon.  The bladder is moderately distended and grossly unremarkable in appearance.  The prostate is enlarged, measuring 5.2 cm in transverse dimension, with nodular impression on the base of the bladder.  No inguinal lymphadenopathy is seen.  No acute osseous abnormalities are identified.  IMPRESSION:  1.  No acute abnormality seen within the abdomen or pelvis. 2.  Mild intramural thrombus and relatively diffuse calcification  along the abdominal aorta, without significant luminal narrowing. 3.  Scattered bilateral renal cysts seen; mild right renal scarring noted. 4.  Enlarged prostate noted, with nodular impression on the base of the bladder.  Would correlate with PSA.   Original Report Authenticated By: Tonia Ghent, M.D.     Scheduled Meds:   . dicyclomine  20 mg Oral TID  . enoxaparin (LOVENOX) injection  40 mg Subcutaneous Q24H  . escitalopram  10 mg Oral Daily  . furosemide  20 mg Oral Daily  . lisinopril  20 mg Oral Daily  . lubiprostone  24 mcg Oral Q breakfast  . pantoprazole  40 mg Oral Daily  . QUEtiapine Fumarate  300 mg Oral QHS  . regadenoson  0.4 mg Intravenous Once  . traZODone  50 mg Oral QHS   Continuous Infusions:   Principal Problem:  *Chest pain Active Problems:  DYSLIPIDEMIA  HYPERTENSION, ESSENTIAL NOS  GERD  Irritable bowel syndrome  ABDOMINAL PAIN -GENERALIZED  Anemia    Time spent: 27    [facility] M  [facility]  If 8PM-8AM, please contact night-coverage at [email or web address], password [facility] 11/01/2012, 7:12 AM  LOS: 1 day

## 2012-11-01 NOTE — Discharge Summary (Signed)
Patient seen and examined. Agree with note by Toya Smothers, NP. Will be discharged home today. Had a normal stress test. No plans for further cardiac work up at this time.  Peggye Pitt, MD [facility] Pager: [phone]

## 2012-11-01 NOTE — Progress Notes (Signed)
Interpreter scheduled to arrive at 3pm.  RN and MD notified.   Angelia Mould, MSW, [place] [phone]

## 2012-11-01 NOTE — Progress Notes (Addendum)
I have discussed the patient with Ms. Ingold.  No interpreter readily available. Results of the Myoview back  = normal function with no evidence of ischemia or prior infarction.  Otherwise be stable for d/c from a cardiac perspective. CP is Likley MSK related & non-anginal / non-cardiac.  Marykay Lex, M.D., M.S. [facility] [address] [place], [place]  [address]  [phone] Pager # [phone] 11/01/2012 2:07 PM

## 2012-11-01 NOTE — Progress Notes (Signed)
Subjective: Mid abdominal tenderness.  Vomited once last evening.  Objective: Vital signs in last 24 hours: Temp:  [97.6 F (36.4 C)-98.4 F (36.9 C)] 97.6 F (36.4 C) (01/03 0500) Pulse Rate:  [67-89] 67  (01/03 0500) Resp:  [16-24] 18  (01/03 0500) BP: (132-158)/(76-91) 142/83 mmHg (01/03 0500) SpO2:  [98 %-100 %] 100 % (01/03 0500) Weight:  [147 lb 1.6 oz (66.724 kg)-148 lb 14.4 oz (67.541 kg)] 147 lb 1.6 oz (66.724 kg) (01/03 0500)    Intake/Output from previous day: 01/02 0701 - 01/03 0700 In: 150 [P.O.:150] Out: 725 [Urine:725] Intake/Output this shift: Total I/O In: 150 [P.O.:150] Out: 475 [Urine:475]  General appearance: alert and no distress GI: soft, non-tender; bowel sounds normal; no masses,  no organomegaly  Lab Results:  [facility] 11/01/12 0540 10/30/12 2306  WBC 4.8 4.7  HGB 10.2* 9.9*  HCT 30.9* 31.0*  PLT 183 178   BMET  Basename 10/30/12 2306  NA 135  K 4.1  CL 100  CO2 24  GLUCOSE 103*  BUN 17  CREATININE 1.20  CALCIUM 9.5   LFT  Basename 10/31/12 0524  PROT 7.6  ALBUMIN 4.0  AST 24  ALT 25  ALKPHOS 46  BILITOT 0.3  BILIDIR 0.1  IBILI 0.2*   PT/INR No results found for this basename: LABPROT:2,INR:2 in the last 72 hours Hepatitis Panel No results found for this basename: HEPBSAG,HCVAB,HEPAIGM,HEPBIGM in the last 72 hours C-Diff No results found for this basename: CDIFFTOX:3 in the last 72 hours Fecal Lactopherrin No results found for this basename: FECLLACTOFRN in the last 72 hours  Studies/Results: Dg Chest 2 View  10/30/2012  *RADIOLOGY REPORT*  Clinical Data: Chest pain and shortness of breath.  CHEST - 2 VIEW  Comparison: Chest radiograph performed 04/03/2011  Findings: The lungs are well-aerated and clear.  There is no evidence of focal opacification, pleural effusion or pneumothorax. Bilateral nipple shadows are noted.  The heart is normal in size; the mediastinal contour is within normal limits.  No acute osseous  abnormalities are seen.  Clips are noted within the right upper quadrant, reflecting prior cholecystectomy.  IMPRESSION: No acute cardiopulmonary process seen.   Original Report Authenticated By: Tonia Ghent, M.D.    Ct Abdomen Pelvis W Contrast  10/31/2012  *RADIOLOGY REPORT*  Clinical Data: Headache and bilateral arm pain; generalized body aches and abdominal pain.  Chest pain.  Diarrhea.  CT ABDOMEN AND PELVIS WITH CONTRAST  Technique:  Multidetector CT imaging of the abdomen and pelvis was performed following the standard protocol during bolus administration of intravenous contrast.  Contrast: OMNIPAQUE IOHEXOL 300 MG/ML  SOLN  Comparison: CT of the abdomen and pelvis performed 03/16/2010  Findings:  Minimal bibasilar atelectasis is noted.  The liver and spleen are unremarkable in appearance.  The patient is status post cholecystectomy, with clips noted along the gallbladder fossa.  The pancreas and adrenal glands are unremarkable in appearance.  Scattered hypodensities are noted within both kidneys, compatible with cysts, measuring up to 2.2 cm in size.  Mild right renal scarring is noted.  The kidneys are otherwise unremarkable.  There is no evidence of hydronephrosis.  No renal or ureteral stones are seen.  This could be perinephric stranding is appreciated.  No free fluid is identified.  The small bowel is unremarkable in appearance.  The stomach is within normal limits.  No acute vascular abnormalities are seen.  Mild intramural thrombus is noted along the abdominal aorta, without significant luminal narrowing. Relatively diffuse calcification  is seen along the abdominal aorta and its branches.  The appendix is normal in caliber, without evidence for appendicitis.  The colon is largely filled with stool and is unremarkable in appearance; there is mild redundancy of the sigmoid colon.  The bladder is moderately distended and grossly unremarkable in appearance.  The prostate is enlarged, measuring  5.2 cm in transverse dimension, with nodular impression on the base of the bladder.  No inguinal lymphadenopathy is seen.  No acute osseous abnormalities are identified.  IMPRESSION:  1.  No acute abnormality seen within the abdomen or pelvis. 2.  Mild intramural thrombus and relatively diffuse calcification along the abdominal aorta, without significant luminal narrowing. 3.  Scattered bilateral renal cysts seen; mild right renal scarring noted. 4.  Enlarged prostate noted, with nodular impression on the base of the bladder.  Would correlate with PSA.   Original Report Authenticated By: Tonia Ghent, M.D.     Medications:  Scheduled:   . dicyclomine  20 mg Oral TID  . enoxaparin (LOVENOX) injection  40 mg Subcutaneous Q24H  . escitalopram  10 mg Oral Daily  . furosemide  20 mg Oral Daily  . lisinopril  20 mg Oral Daily  . lubiprostone  24 mcg Oral Q breakfast  . pantoprazole  40 mg Oral Daily  . QUEtiapine Fumarate  300 mg Oral QHS  . regadenoson  0.4 mg Intravenous Once  . traZODone  50 mg Oral QHS   Continuous:   Assessment/Plan: 1) Chronic abdominal pain. 2) Chest pain.   The patient remains stable.  He is not in any distress with his pain.  Again, I do tend to feel that this is a functional issue.  Plan: 1) Continue with supportive care and hyoscyamine. 2) Follow up with Dr. Loreta Ave in 2-4 weeks.  LOS: 1 day   Omar Sanders D 11/01/2012, 6:47 AM

## 2012-11-01 NOTE — Progress Notes (Signed)
Pt discharged to home per MD order. Pt received and reviewed with [place] interpreter all discharge instructions and medication information including follow-up appointments and prescriptions.  Pt and family verbalized understanding and questions were answered. Pt alert and oriented at discharge with no complaints. Pt IV removed prior to discharge.  Pt ambulated to private vehicle per pt request.  Efraim Kaufmann

## 2012-11-06 LAB — CULTURE, BLOOD (ROUTINE X 2)
Culture: NO GROWTH
Culture: NO GROWTH

## 2013-02-10 LAB — CBC AND DIFFERENTIAL
HCT: 37 % — AB (ref 41–53)
Hemoglobin: 12.4 g/dL — AB (ref 13.5–17.5)
Neutrophils Absolute: 2 /uL
Platelets: 171 10*3/uL (ref 150–399)
WBC: 5.1 10^3/mL

## 2013-02-10 LAB — LIPID PANEL
Cholesterol: 182 mg/dL (ref 0–200)
HDL: 32 mg/dL — AB (ref 35–70)
LDL Cholesterol: 120 mg/dL
Triglycerides: 152 mg/dL (ref 40–160)

## 2013-02-10 LAB — BASIC METABOLIC PANEL
BUN: 27 mg/dL — AB (ref 4–21)
Creatinine: 1.2 mg/dL (ref 0.6–1.3)
Glucose: 78 mg/dL
Potassium: 4.3 mmol/L (ref 3.4–5.3)
Sodium: 138 mmol/L (ref 137–147)

## 2013-02-10 LAB — HEPATIC FUNCTION PANEL
ALT: 26 U/L (ref 10–40)
AST: 24 U/L (ref 14–40)
Alkaline Phosphatase: 50 U/L (ref 25–125)
Bilirubin, Total: 0.4 mg/dL

## 2013-03-06 ENCOUNTER — Ambulatory Visit (INDEPENDENT_AMBULATORY_CARE_PROVIDER_SITE_OTHER): Payer: Self-pay | Admitting: General Surgery

## 2013-03-06 ENCOUNTER — Ambulatory Visit (INDEPENDENT_AMBULATORY_CARE_PROVIDER_SITE_OTHER): Payer: PRIVATE HEALTH INSURANCE | Admitting: General Surgery

## 2013-03-06 ENCOUNTER — Encounter (INDEPENDENT_AMBULATORY_CARE_PROVIDER_SITE_OTHER): Payer: Self-pay | Admitting: General Surgery

## 2013-03-06 VITALS — BP 122/78 | HR 72 | Temp 97.3°F | Resp 16 | Ht 67.0 in | Wt 150.0 lb

## 2013-03-06 DIAGNOSIS — K4090 Unilateral inguinal hernia, without obstruction or gangrene, not specified as recurrent: Secondary | ICD-10-CM

## 2013-03-06 NOTE — Progress Notes (Signed)
Patient ID: Omar Sanders, male   DOB: 01/15/1941, 71 y.o.   MRN: [id number]  No chief complaint on file.   HPI Omar Sanders is a 71 y.o. male.  The patient is a 71-year-old Vietnamese speaking male who was referred by Dr. Guest secondary to an evaluation for a left inguinal hernia. The history is obtained via a Vietnamese translator.The patient states that the hernia been there for several weeks. The patient has left inguinal hernia pain that is referred to his abdomen. He states he has no pain in his right inguinal area. HPI  Past Medical History  Diagnosis Date  . GERD (gastroesophageal reflux disease)   . Hypertension   . Headache   . Neuromuscular disorder     periferal neuropathy per patient  . OSA (obstructive sleep apnea) 11/2009    sleep study with mild OSA, pt unwilling to use CPAP  . Anxiety     Past Surgical History  Procedure Laterality Date  . Cardiac catheterization  05/2019    to   . Cholecystectomy    . Colonoscopy  2012    Dr Mann    No family history on file.  Social History History  Substance Use Topics  . Smoking status: Never Smoker   . Smokeless tobacco: Never Used  . Alcohol Use: No    Allergies  Allergen Reactions  . Aspirin   . Vitamin C     Current Outpatient Prescriptions  Medication Sig Dispense Refill  . dicyclomine (BENTYL) 20 MG tablet Take 20 mg by mouth 3 (three) times daily.      . gabapentin (NEURONTIN) 300 MG capsule Take 300 mg by mouth 3 (three) times daily.      . lansoprazole (PREVACID) 30 MG capsule Take 30 mg by mouth daily.      . QUEtiapine Fumarate (SEROQUEL XR) 150 MG 24 hr tablet Take 300 mg by mouth at bedtime.      . traZODone (DESYREL) 50 MG tablet Take 50 mg by mouth at bedtime.      . triazolam (HALCION) 0.25 MG tablet Take 0.25 mg by mouth at bedtime as needed.       No current facility-administered medications for this visit.    Review of Systems Review of Systems  Constitutional: Negative.   HENT:  Negative.   Respiratory: Negative.   Cardiovascular: Negative.   Gastrointestinal: Positive for abdominal pain.  Musculoskeletal: Negative.   Neurological: Negative.   All other systems reviewed and are negative.    Blood pressure 122/78, pulse 72, temperature 97.3 F (36.3 C), resp. rate 16, height 5' 7" (1.702 m), weight 150 lb (68.04 kg).  Physical Exam Physical Exam  Constitutional: He is oriented to person, place, and time. He appears well-developed and well-nourished.  HENT:  Head: Normocephalic and atraumatic.  Eyes: Conjunctivae and EOM are normal. Pupils are equal, round, and reactive to light.  Neck: Normal range of motion. Neck supple.  Cardiovascular: Normal rate, regular rhythm and normal heart sounds.   Pulmonary/Chest: Effort normal and breath sounds normal.  Abdominal: Soft. Bowel sounds are normal. A hernia is present. Hernia confirmed positive in the right inguinal area and confirmed positive in the left inguinal area.  Musculoskeletal: Normal range of motion.  Neurological: He is alert and oriented to person, place, and time.    Data Reviewed none  Assessment    The patient is a 71-year-old male with a symptomatic left inguinal hernia. The patient has no pain in his right   side and does not requests repair of his right inguinal hernia at this time.     Plan    1. Using a Vietnamese translator  I discussed the  Laparoscopic left inguinal hernia repair with mesh. The patient understood and wished to proceed. 2. Using a Vietnamese translator all risks and benefits were discussed with the patient, to generally include infection, bleeding, damage to surrounding structures, acute and chronic nerve pain, and recurrence. Alternatives were offered and described.  All questions were answered and the patient voiced understanding of the procedure and wishes to proceed at this point.         Hooria Gasparini Jr., Ruther Ephraim 03/06/2013, 9:42 AM    

## 2013-03-10 ENCOUNTER — Encounter (HOSPITAL_COMMUNITY): Payer: Self-pay | Admitting: *Deleted

## 2013-03-10 ENCOUNTER — Encounter (HOSPITAL_COMMUNITY)
Admission: RE | Admit: 2013-03-10 | Discharge: 2013-03-10 | Disposition: A | Discharge status: Home / Self Care | Payer: PRIVATE HEALTH INSURANCE | Source: Ambulatory Visit | Attending: General Surgery | Admitting: General Surgery

## 2013-03-10 MED ORDER — CEFAZOLIN SODIUM-DEXTROSE 2-3 GM-% IV SOLR
2.0000 g | INTRAVENOUS | Status: AC
  Administered 2013-03-11: 2 g via INTRAVENOUS
  Filled 2013-03-10: qty 50

## 2013-03-10 NOTE — Pre-Procedure Instructions (Signed)
Omar Sanders  03/10/2013   Your procedure is scheduled on: Tuesday, May 13th   Report to [facility] at 8:30 AM.              (Come through Entrance A--follow signs to [facility] up to 3rd floor)   Call this number if you have problems the morning of surgery: [phone]   Remember:   Do not eat food or drink liquids after midnight Tonight.   Take these medicines the morning of surgery with A SIP OF WATER: Gabapentin   Do not wear jewelry.  Do not wear lotions, powders, or colognes. You may NOT wear deodorant.   Men may shave face and neck.   Do not bring valuables to the hospital.  Contacts, dentures or bridgework may not be worn into surgery.   Leave suitcase in the car. After surgery it may be brought to your room.  For patients admitted to the hospital, checkout time is 11:00 AM the day of discharge.   Patients discharged the day of surgery will not be allowed to drive home, and will need someone to stay with them for 24 hrs             After the surgery.   Name and phone number of your driver:    Special Instructions: Shower using CHG 2 nights before surgery and the night before surgery.  If you shower the day of surgery use CHG.  Use special wash - you have one bottle of CHG for all showers.  You should use approximately 1/3 of the bottle for each shower.   Please read over the following fact sheets that you were given: Pain Booklet, MRSA Information and Surgical Site Infection Prevention

## 2013-03-10 NOTE — Progress Notes (Signed)
Pt SDW was completed using a vietnamese interpreter. Pt states that, after the cardiac catheterization, he had no problems with his heart. Pt states that he was advised not to take any medications the morning of surgery .                    When pt given the arrival time of 8:00AM, pt states that" the doctor told me to come at 5:30." Pt advised to follow doctors orders as stated.

## 2013-03-11 ENCOUNTER — Encounter (HOSPITAL_COMMUNITY): Payer: Self-pay | Admitting: *Deleted

## 2013-03-11 ENCOUNTER — Encounter (HOSPITAL_COMMUNITY)
Admission: RE | Disposition: A | Discharge status: Home / Self Care | Payer: Self-pay | Source: Ambulatory Visit | Attending: General Surgery

## 2013-03-11 ENCOUNTER — Encounter (HOSPITAL_COMMUNITY): Payer: Self-pay | Admitting: Anesthesiology

## 2013-03-11 ENCOUNTER — Ambulatory Visit (HOSPITAL_COMMUNITY)
Admission: RE | Admit: 2013-03-11 | Discharge: 2013-03-11 | Disposition: A | Discharge status: Home / Self Care | Payer: PRIVATE HEALTH INSURANCE | Source: Ambulatory Visit | Attending: General Surgery | Admitting: General Surgery

## 2013-03-11 ENCOUNTER — Ambulatory Visit (HOSPITAL_COMMUNITY): Payer: PRIVATE HEALTH INSURANCE | Admitting: Anesthesiology

## 2013-03-11 ENCOUNTER — Telehealth (INDEPENDENT_AMBULATORY_CARE_PROVIDER_SITE_OTHER): Payer: Self-pay | Admitting: General Surgery

## 2013-03-11 DIAGNOSIS — G609 Hereditary and idiopathic neuropathy, unspecified: Secondary | ICD-10-CM | POA: Insufficient documentation

## 2013-03-11 DIAGNOSIS — F411 Generalized anxiety disorder: Secondary | ICD-10-CM | POA: Insufficient documentation

## 2013-03-11 DIAGNOSIS — K4020 Bilateral inguinal hernia, without obstruction or gangrene, not specified as recurrent: Secondary | ICD-10-CM

## 2013-03-11 DIAGNOSIS — I10 Essential (primary) hypertension: Secondary | ICD-10-CM | POA: Insufficient documentation

## 2013-03-11 DIAGNOSIS — Z79899 Other long term (current) drug therapy: Secondary | ICD-10-CM | POA: Insufficient documentation

## 2013-03-11 DIAGNOSIS — G4733 Obstructive sleep apnea (adult) (pediatric): Secondary | ICD-10-CM | POA: Insufficient documentation

## 2013-03-11 DIAGNOSIS — Z9109 Other allergy status, other than to drugs and biological substances: Secondary | ICD-10-CM | POA: Insufficient documentation

## 2013-03-11 DIAGNOSIS — K219 Gastro-esophageal reflux disease without esophagitis: Secondary | ICD-10-CM | POA: Insufficient documentation

## 2013-03-11 DIAGNOSIS — Z886 Allergy status to analgesic agent status: Secondary | ICD-10-CM | POA: Insufficient documentation

## 2013-03-11 HISTORY — DX: Unspecified urinary incontinence: R32

## 2013-03-11 HISTORY — DX: Hyperlipidemia, unspecified: E78.5

## 2013-03-11 HISTORY — PX: INGUINAL HERNIA REPAIR: SHX194

## 2013-03-11 HISTORY — PX: INSERTION OF MESH: SHX5868

## 2013-03-11 LAB — BASIC METABOLIC PANEL
BUN: 24 mg/dL — ABNORMAL HIGH (ref 6–23)
CO2: 28 mEq/L (ref 19–32)
Calcium: 9.3 mg/dL (ref 8.4–10.5)
Chloride: 101 mEq/L (ref 96–112)
Creatinine, Ser: 1.22 mg/dL (ref 0.50–1.35)
GFR calc Af Amer: 66 mL/min — ABNORMAL LOW (ref >90)
GFR calc non Af Amer: 57 mL/min — ABNORMAL LOW (ref >90)
Glucose, Bld: 101 mg/dL — ABNORMAL HIGH (ref 70–99)
Potassium: 4.3 mEq/L (ref 3.5–5.1)
Sodium: 137 mEq/L (ref 135–145)

## 2013-03-11 LAB — CBC
HCT: 35.5 % — ABNORMAL LOW (ref 39.0–52.0)
Hemoglobin: 12.3 g/dL — ABNORMAL LOW (ref 13.0–17.0)
MCH: 27.4 pg (ref 26.0–34.0)
MCHC: 34.6 g/dL (ref 30.0–36.0)
MCV: 79.1 fL (ref 78.0–100.0)
Platelets: 168 10*3/uL (ref 150–400)
RBC: 4.49 MIL/uL (ref 4.22–5.81)
RDW: 13.5 % (ref 11.5–15.5)
WBC: 6 10*3/uL (ref 4.0–10.5)

## 2013-03-11 LAB — SURGICAL PCR SCREEN
MRSA, PCR: NEGATIVE
Staphylococcus aureus: NEGATIVE

## 2013-03-11 SURGERY — INSERTION OF MESH
Anesthesia: General | Site: Abdomen | Laterality: Left | Wound class: Clean

## 2013-03-11 MED ORDER — SUFENTANIL CITRATE 50 MCG/ML IV SOLN
INTRAVENOUS | Status: DC | PRN
  Administered 2013-03-11: 15 ug via INTRAVENOUS

## 2013-03-11 MED ORDER — NEOSTIGMINE METHYLSULFATE 1 MG/ML IJ SOLN
INTRAMUSCULAR | Status: DC | PRN
  Administered 2013-03-11: 4 mg via INTRAVENOUS

## 2013-03-11 MED ORDER — LACTATED RINGERS IV SOLN
INTRAVENOUS | Status: DC | PRN
  Administered 2013-03-11 (×2): via INTRAVENOUS

## 2013-03-11 MED ORDER — BUPIVACAINE HCL (PF) 0.25 % IJ SOLN
INTRAMUSCULAR | Status: AC
  Filled 2013-03-11: qty 30

## 2013-03-11 MED ORDER — GLYCOPYRROLATE 0.2 MG/ML IJ SOLN
INTRAMUSCULAR | Status: DC | PRN
  Administered 2013-03-11: .4 mg via INTRAVENOUS
  Administered 2013-03-11: 0.2 mg via INTRAVENOUS

## 2013-03-11 MED ORDER — PROMETHAZINE HCL 25 MG/ML IJ SOLN: 6.2500 mg | INTRAMUSCULAR | Status: DC | PRN

## 2013-03-11 MED ORDER — BUPIVACAINE HCL 0.25 % IJ SOLN
INTRAMUSCULAR | Status: DC | PRN
  Administered 2013-03-11: 6 mL

## 2013-03-11 MED ORDER — ONDANSETRON HCL 4 MG/2ML IJ SOLN
INTRAMUSCULAR | Status: DC | PRN
  Administered 2013-03-11: 4 mg via INTRAVENOUS

## 2013-03-11 MED ORDER — PHENYLEPHRINE HCL 10 MG/ML IJ SOLN
10.0000 mg | INTRAVENOUS | Status: DC | PRN
  Administered 2013-03-11: 20 ug/min via INTRAVENOUS

## 2013-03-11 MED ORDER — KETOROLAC TROMETHAMINE 30 MG/ML IJ SOLN: 30.0000 mg | Freq: Once | INTRAMUSCULAR | Status: AC

## 2013-03-11 MED ORDER — OXYCODONE HCL 5 MG PO TABS
ORAL_TABLET | ORAL | Status: AC
  Administered 2013-03-11: 10 mg via ORAL
  Filled 2013-03-11: qty 2

## 2013-03-11 MED ORDER — OXYCODONE HCL 5 MG PO TABS: 5.0000 mg | ORAL_TABLET | ORAL | Status: DC | PRN

## 2013-03-11 MED ORDER — OXYCODONE HCL 5 MG/5ML PO SOLN: 5.0000 mg | Freq: Once | ORAL | Status: AC | PRN

## 2013-03-11 MED ORDER — MUPIROCIN 2 % EX OINT
TOPICAL_OINTMENT | Freq: Two times a day (BID) | CUTANEOUS | Status: DC
  Administered 2013-03-11: 1 via NASAL
  Filled 2013-03-11: qty 22

## 2013-03-11 MED ORDER — LACTATED RINGERS IV SOLN
INTRAVENOUS | Status: DC
  Administered 2013-03-11: 09:00:00 via INTRAVENOUS

## 2013-03-11 MED ORDER — ACETAMINOPHEN 325 MG PO TABS
650.0000 mg | ORAL_TABLET | ORAL | Status: DC | PRN
  Filled 2013-03-11: qty 2

## 2013-03-11 MED ORDER — OXYCODONE-ACETAMINOPHEN 2.5-325 MG PO TABS: 1.0000 | ORAL_TABLET | ORAL | Status: DC | PRN

## 2013-03-11 MED ORDER — KETOROLAC TROMETHAMINE 30 MG/ML IJ SOLN
INTRAMUSCULAR | Status: AC
  Administered 2013-03-11: 30 mg via INTRAVENOUS
  Filled 2013-03-11: qty 1

## 2013-03-11 MED ORDER — SODIUM CHLORIDE 0.9 % IV SOLN: 250.0000 mL | INTRAVENOUS | Status: DC | PRN

## 2013-03-11 MED ORDER — SODIUM CHLORIDE 0.9 % IJ SOLN: 3.0000 mL | INTRAMUSCULAR | Status: DC | PRN

## 2013-03-11 MED ORDER — HYDROMORPHONE HCL PF 1 MG/ML IJ SOLN
INTRAMUSCULAR | Status: AC
  Administered 2013-03-11: 0.5 mg via INTRAVENOUS
  Filled 2013-03-11: qty 1

## 2013-03-11 MED ORDER — ROCURONIUM BROMIDE 100 MG/10ML IV SOLN
INTRAVENOUS | Status: DC | PRN
Start: 2013-03-11 — End: 2013-03-11
  Administered 2013-03-11: 10 mg via INTRAVENOUS
  Administered 2013-03-11: 30 mg via INTRAVENOUS
  Administered 2013-03-11: 10 mg via INTRAVENOUS

## 2013-03-11 MED ORDER — SODIUM CHLORIDE 0.9 % IJ SOLN: 3.0000 mL | Freq: Two times a day (BID) | INTRAMUSCULAR | Status: DC

## 2013-03-11 MED ORDER — ACETAMINOPHEN 650 MG RE SUPP: 650.0000 mg | RECTAL | Status: DC | PRN

## 2013-03-11 MED ORDER — CHLORHEXIDINE GLUCONATE 4 % EX LIQD: 1.0000 "application " | Freq: Once | CUTANEOUS | Status: DC

## 2013-03-11 MED ORDER — OXYCODONE HCL 5 MG PO TABS: 5.0000 mg | ORAL_TABLET | Freq: Once | ORAL | Status: AC | PRN

## 2013-03-11 MED ORDER — PROMETHAZINE HCL 25 MG/ML IJ SOLN
INTRAMUSCULAR | Status: AC
  Administered 2013-03-11: 12.5 mg via INTRAVENOUS
  Filled 2013-03-11: qty 1

## 2013-03-11 MED ORDER — HYDROMORPHONE HCL PF 1 MG/ML IJ SOLN
0.2500 mg | INTRAMUSCULAR | Status: DC | PRN
  Administered 2013-03-11 (×3): 0.5 mg via INTRAVENOUS

## 2013-03-11 MED ORDER — 0.9 % SODIUM CHLORIDE (POUR BTL) OPTIME
TOPICAL | Status: DC | PRN
  Administered 2013-03-11: 1000 mL

## 2013-03-11 MED ORDER — LIDOCAINE HCL 4 % MT SOLN
OROMUCOSAL | Status: DC | PRN
  Administered 2013-03-11: 4 mL via TOPICAL

## 2013-03-11 MED ORDER — OXYCODONE HCL 5 MG PO TABS
ORAL_TABLET | ORAL | Status: AC
  Administered 2013-03-11: 5 mg via ORAL
  Filled 2013-03-11: qty 1

## 2013-03-11 MED ORDER — ONDANSETRON HCL 4 MG/2ML IJ SOLN
INTRAMUSCULAR | Status: AC
  Administered 2013-03-11: 4 mg via INTRAVENOUS
  Filled 2013-03-11: qty 2

## 2013-03-11 MED ORDER — PROPOFOL 10 MG/ML IV BOLUS
INTRAVENOUS | Status: DC | PRN
Start: 2013-03-11 — End: 2013-03-11
  Administered 2013-03-11: 150 mg via INTRAVENOUS

## 2013-03-11 MED ORDER — ONDANSETRON HCL 4 MG/2ML IJ SOLN: 4.0000 mg | Freq: Four times a day (QID) | INTRAMUSCULAR | Status: DC | PRN

## 2013-03-11 SURGICAL SUPPLY — 41 items
APPLIER CLIP LOGIC TI 5 (MISCELLANEOUS) IMPLANT
BENZOIN TINCTURE PRP APPL 2/3 (GAUZE/BANDAGES/DRESSINGS) IMPLANT
CANISTER SUCTION 2500CC (MISCELLANEOUS) IMPLANT
CHLORAPREP W/TINT 26ML (MISCELLANEOUS) ×3 IMPLANT
CLOTH BEACON ORANGE TIMEOUT ST (SAFETY) ×3 IMPLANT
COVER SURGICAL LIGHT HANDLE (MISCELLANEOUS) ×3 IMPLANT
DEVICE TROCAR PUNCTURE CLOSURE (ENDOMECHANICALS) ×3 IMPLANT
DISSECT BALLN SPACEMKR + OVL (BALLOONS) ×3
DISSECTOR BALLN SPACEMKR + OVL (BALLOONS) ×2 IMPLANT
DISSECTOR BLUNT TIP ENDO 5MM (MISCELLANEOUS) IMPLANT
DRAPE UTILITY 15X26 W/TAPE STR (DRAPE) ×6 IMPLANT
ELECT REM PT RETURN 9FT ADLT (ELECTROSURGICAL) ×3
ELECTRODE REM PT RTRN 9FT ADLT (ELECTROSURGICAL) ×2 IMPLANT
GAUZE SPONGE 2X2 8PLY STRL LF (GAUZE/BANDAGES/DRESSINGS) IMPLANT
GLOVE BIO SURGEON STRL SZ7.5 (GLOVE) ×6 IMPLANT
GLOVE BIOGEL PI IND STRL 7.0 (GLOVE) ×2 IMPLANT
GLOVE BIOGEL PI INDICATOR 7.0 (GLOVE) ×1
GLOVE SURG SS PI 7.0 STRL IVOR (GLOVE) ×6 IMPLANT
GOWN STRL NON-REIN LRG LVL3 (GOWN DISPOSABLE) ×6 IMPLANT
GOWN STRL REIN XL XLG (GOWN DISPOSABLE) ×3 IMPLANT
KIT BASIN OR (CUSTOM PROCEDURE TRAY) ×3 IMPLANT
KIT ROOM TURNOVER OR (KITS) ×3 IMPLANT
MESH PARIETEX 20X20CM (Mesh General) ×3 IMPLANT
NEEDLE INSUFFLATION 14GA 120MM (NEEDLE) ×3 IMPLANT
NS IRRIG 1000ML POUR BTL (IV SOLUTION) ×3 IMPLANT
PAD ARMBOARD 7.5X6 YLW CONV (MISCELLANEOUS) ×6 IMPLANT
RELOAD STAPLE HERNIA 4.0 BLUE (INSTRUMENTS) ×6 IMPLANT
RELOAD STAPLE HERNIA 4.8 BLK (STAPLE) ×3 IMPLANT
SCISSORS LAP 5X35 DISP (ENDOMECHANICALS) ×3 IMPLANT
SET IRRIG TUBING LAPAROSCOPIC (IRRIGATION / IRRIGATOR) IMPLANT
SET TROCAR LAP APPLE-HUNT 5MM (ENDOMECHANICALS) ×3 IMPLANT
SPONGE GAUZE 2X2 STER 10/PKG (GAUZE/BANDAGES/DRESSINGS)
STAPLER HERNIA 12 8.5 360D (INSTRUMENTS) ×3 IMPLANT
SUT MNCRL AB 4-0 PS2 18 (SUTURE) ×6 IMPLANT
SUT VIC AB 1 CT1 27 (SUTURE) ×1
SUT VIC AB 1 CT1 27XBRD ANBCTR (SUTURE) ×2 IMPLANT
TOWEL OR 17X24 6PK STRL BLUE (TOWEL DISPOSABLE) ×3 IMPLANT
TOWEL OR 17X26 10 PK STRL BLUE (TOWEL DISPOSABLE) ×3 IMPLANT
TRAY FOLEY CATH 14FR (SET/KITS/TRAYS/PACK) ×3 IMPLANT
TRAY LAPAROSCOPIC (CUSTOM PROCEDURE TRAY) ×3 IMPLANT
TROCAR XCEL 12X100 BLDLESS (ENDOMECHANICALS) ×3 IMPLANT

## 2013-03-11 NOTE — Progress Notes (Signed)
Dr. Derrell Lolling at bedside to see patient.  Answered all patient's questions with help from the interpreter.  Patient cleared for discharge.

## 2013-03-11 NOTE — H&P (View-Only) (Signed)
Patient ID: Omar Sanders, male   DOB: [birth year], 73 y.o.   MRN: [id number]  No chief complaint on file.   HPI Omar Sanders is a 72 y.o. male.  The patient is a 72 year old [place] speaking male who was referred by Dr. Perrin Maltese secondary to an evaluation for a left inguinal hernia. The history is obtained via a [place].The patient states that the hernia been there for several weeks. The patient has left inguinal hernia pain that is referred to his abdomen. He states he has no pain in his right inguinal area. HPI  Past Medical History  Diagnosis Date  . GERD (gastroesophageal reflux disease)   . Hypertension   . Headache   . Neuromuscular disorder     periferal neuropathy per patient  . OSA (obstructive sleep apnea) 11/2009    sleep study with mild OSA, pt unwilling to use CPAP  . Anxiety     Past Surgical History  Procedure Laterality Date  . Cardiac catheterization  05/2019    to   . Cholecystectomy    . Colonoscopy  2012    Dr Loreta Ave    No family history on file.  Social History History  Substance Use Topics  . Smoking status: Never Smoker   . Smokeless tobacco: Never Used  . Alcohol Use: No    Allergies  Allergen Reactions  . Aspirin   . Vitamin C     Current Outpatient Prescriptions  Medication Sig Dispense Refill  . dicyclomine (BENTYL) 20 MG tablet Take 20 mg by mouth 3 (three) times daily.      Marland Kitchen gabapentin (NEURONTIN) 300 MG capsule Take 300 mg by mouth 3 (three) times daily.      . lansoprazole (PREVACID) 30 MG capsule Take 30 mg by mouth daily.      . QUEtiapine Fumarate (SEROQUEL XR) 150 MG 24 hr tablet Take 300 mg by mouth at bedtime.      . traZODone (DESYREL) 50 MG tablet Take 50 mg by mouth at bedtime.      . triazolam (HALCION) 0.25 MG tablet Take 0.25 mg by mouth at bedtime as needed.       No current facility-administered medications for this visit.    Review of Systems Review of Systems  Constitutional: Negative.   HENT:  Negative.   Respiratory: Negative.   Cardiovascular: Negative.   Gastrointestinal: Positive for abdominal pain.  Musculoskeletal: Negative.   Neurological: Negative.   All other systems reviewed and are negative.    Blood pressure 122/78, pulse 72, temperature 97.3 F (36.3 C), resp. rate 16, height 5\' 7"  (1.702 m), weight 150 lb (68.04 kg).  Physical Exam Physical Exam  Constitutional: He is oriented to person, place, and time. He appears well-developed and well-nourished.  HENT:  Head: Normocephalic and atraumatic.  Eyes: Conjunctivae and EOM are normal. Pupils are equal, round, and reactive to light.  Neck: Normal range of motion. Neck supple.  Cardiovascular: Normal rate, regular rhythm and normal heart sounds.   Pulmonary/Chest: Effort normal and breath sounds normal.  Abdominal: Soft. Bowel sounds are normal. A hernia is present. Hernia confirmed positive in the right inguinal area and confirmed positive in the left inguinal area.  Musculoskeletal: Normal range of motion.  Neurological: He is alert and oriented to person, place, and time.    Data Reviewed none  Assessment    The patient is a 72 year old male with a symptomatic left inguinal hernia. The patient has no pain in his right  side and does not requests repair of his right inguinal hernia at this time.     Plan    1. Using a [place] translator  I discussed the  Laparoscopic left inguinal hernia repair with mesh. The patient understood and wished to proceed. 2. Using a [place] translator all risks and benefits were discussed with the patient, to generally include infection, bleeding, damage to surrounding structures, acute and chronic nerve pain, and recurrence. Alternatives were offered and described.  All questions were answered and the patient voiced understanding of the procedure and wishes to proceed at this point.         Marigene Ehlers., Omar Sanders 03/06/2013, 9:42 AM

## 2013-03-11 NOTE — Anesthesia Preprocedure Evaluation (Addendum)
Anesthesia Evaluation  Patient identified by MRN, date of birth, ID band Patient awake    Reviewed: Allergy & Precautions, H&P , NPO status , Patient's Chart, lab work & pertinent test results  History of Anesthesia Complications Negative for: history of anesthetic complications  Airway Mallampati: II TM Distance: >3 FB Neck ROM: Full    Dental  (+) Upper Dentures and Dental Advisory Given   Pulmonary sleep apnea ,    Pulmonary exam normal       Cardiovascular hypertension,     Neuro/Psych  Headaches, Anxiety    GI/Hepatic Neg liver ROS, GERD-  Medicated,  Endo/Other  negative endocrine ROS  Renal/GU negative Renal ROS     Musculoskeletal   Abdominal   Peds  Hematology   Anesthesia Other Findings   Reproductive/Obstetrics                          Anesthesia Physical Anesthesia Plan  ASA: III  Anesthesia Plan: General   Post-op Pain Management:    Induction: Intravenous  Airway Management Planned: Oral ETT  Additional Equipment:   Intra-op Plan:   Post-operative Plan: Extubation in OR  Informed Consent: I have reviewed the patients History and Physical, chart, labs and discussed the procedure including the risks, benefits and alternatives for the proposed anesthesia with the patient or authorized representative who has indicated his/her understanding and acceptance.   Dental advisory given  Plan Discussed with: CRNA, Anesthesiologist and Surgeon  Anesthesia Plan Comments: (Interpreter present)       Anesthesia Quick Evaluation

## 2013-03-11 NOTE — Interval H&P Note (Signed)
History and Physical Interval Note:  03/11/2013 8:44 AM  Omar Sanders  has presented today for surgery, with the diagnosis of left inguinal hernia   The various methods of treatment have been discussed with the patient and family. After consideration of risks, benefits and other options for treatment, the patient has consented to  Procedure(s): LAPAROSCOPIC INGUINAL HERNIA (Left) INSERTION OF MESH (Left) and Right inguinal hernia repair with mesh as a surgical intervention .  The patient's history has been reviewed, patient examined, no change in status, stable for surgery.  I have reviewed the patient's chart and labs.  Questions were answered to the patient's satisfaction.     Marigene Ehlers., Jed Limerick

## 2013-03-11 NOTE — Progress Notes (Signed)
Pt. With continued 8/10 pain and nausea better since phenergan.  Pt. Feels weak and sleepy. Pt. Requested to be admitted.  Dr. Derrell Lolling notified.  Toradol ordered and given.  Will see how pain and nausea resolves before deciding on admission.

## 2013-03-11 NOTE — Telephone Encounter (Signed)
His granddaughter called saying that he cannot urinate after his bilat inguinal hernia repair today.  I recommended that he come to the ER for evaluation and likely foley placement.

## 2013-03-11 NOTE — Op Note (Signed)
Pre Operative Diagnosis: Bilateral inguinal hernia  Post Operative Diagnosis: Bilateral direct inguinal hernias  Procedure: laparoscopic bilateral inguinal hernia repair with mesh  Surgeon: Dr. Axel Filler  Assistant: none  Anesthesia: GETA  EBL: <5cc  Complications: none  Counts: reported as correct x 2  Findings:  The patient had a small right indirect hernia  Indications for procedure:  The patient is a 72 year old male with a bilateral inguinal hernias for several years. Patient complained of symptomatology to his bilateral inguinal area. The patient was taken back for elective inguinal hernia repair.  Details of the procedure: The patient was taken back to the operating room. The patient was placed in supine position with bilateral SCDs in place. After appropriate anitbiotics were confirmed, a time-out was confirmed and all facts were verified. 0.25% Marcaine was used to infiltrate the umbilical area. An #11 blade was used to cut down the skin and blunt dissection was used to get the anterior fashion.  The anterior fascia was incised approximately 1 cm and the muscles were divided anteriorly. Blunt dissection was then used to create a space in the preperitoneal area.  A dissecting balloon was introduced and insuflated under camera visualization.  After appropriate dissection was seen the dissecting balloon was removed.   At this time a 10 mm camera was then introduced and insufflation was started.  At this time and space was created from medial to laterally the preperitoneal space. The hernia sac was identified in the direct space on the left side.  It was dissected away from the surrounding tissue.  The transversalis fascia was than stapled to Cooper's ligament using 4.34mm staples x 3. After this was cleared away, a small indirect hernia was seen and reduced to approximately the level of the umbilicus. Dissection of the hernia sac was undertaken the vas deferens was identified and  protected in all parts of the case.  There was a small tear into the hernia sac. A Veress needle right upper quadrant to help evacuate the intraperitoneal air.  Once the hernia sac was taken down to approximately the umbilicus a TET 20x20 Parietex mesh was cut into shape and introduced into the preperitoneal space.  The mesh was brought over the hernia space defect and anchored into place and secured to Cooper's ligament with 4.10mm staples from a [place] hernia stapler. It was anchored to the anterior abdominal wall with 4.8 mm staples.   I turned my attention to the right side and again a moderate sized direct hernia was seen.  It was dissected away from the surrounding tissue.  The transversalis fascia was than stapled to Cooper's ligament using 4.12mm staples x 3.  There was no indirect hernia seen. The vas deferens was identified and protected on the right side as well.  The second half of the TET Parietex mesh was used and cut into shape and introduced into the preperitoneal space.  The mesh was brought over the hernia space defect and anchored into place and secured to Cooper's ligament with 4.35mm staples from a [place] hernia stapler. It was anchored to the anterior abdominal wall with 4.8 mm staples.    The hernia sac on the left was seen lying anterior to the mesh.  Both direct hernias were adequately covered with the mesh and overlapped each other in the midline.  There was no staples placed laterally. The insufflation was evacuated. The trochars were removed. The anterior fascia was reapproximated using #1 Vicryl on a UR- 6.  Intra-abdominal air was evacuated  and the Veress needle removed. The skin was reapproximated using 4-0 Monocryl subcuticular fashion the patient was awakened from general anesthesia and taken to recovery in stable condition.

## 2013-03-11 NOTE — Anesthesia Procedure Notes (Signed)
Procedure Name: Intubation Date/Time: 03/11/2013 9:14 AM Performed by: Coralee Rud Pre-anesthesia Checklist: Patient identified, Emergency Drugs available, Suction available, Patient being monitored and Timeout performed Patient Re-evaluated:Patient Re-evaluated prior to inductionOxygen Delivery Method: Circle system utilized Preoxygenation: Pre-oxygenation with 100% oxygen Intubation Type: IV induction Ventilation: Mask ventilation without difficulty Laryngoscope Size: Miller and 3 Grade View: Grade I Tube type: Oral Tube size: 7.5 mm Number of attempts: 1 Airway Equipment and Method: Stylet and LTA kit utilized Placement Confirmation: ETT inserted through vocal cords under direct vision,  breath sounds checked- equal and bilateral and positive ETCO2 Secured at: 22 cm Tube secured with: Tape Dental Injury: Teeth and Oropharynx as per pre-operative assessment

## 2013-03-11 NOTE — Preoperative (Signed)
Beta Blockers   Reason not to administer Beta Blockers:Not Applicable 

## 2013-03-11 NOTE — Progress Notes (Signed)
Pt. Still c/o need to urinate.  When presented with a catheter and explained procedure, he voided 150 cc urine.  Pt. States he feels better.

## 2013-03-11 NOTE — Progress Notes (Signed)
Pt c/o need to urinate.  Pt. Has made multiple attempts to urinate since 1110.  Dr. Derrell Lolling notified over the phone.  Plan to give patient more time.  May place patient in Short Stay Section C.  May need to I&O cath patient.

## 2013-03-11 NOTE — Transfer of Care (Signed)
Immediate Anesthesia Transfer of Care Note  Patient: Omar Sanders  Procedure(s) Performed: Procedure(s): INSERTION OF MESH (Bilateral) LAPAROSCOPIC BILATERAL INGUINAL HERNIA REPAIR (Bilateral)  Patient Location: PACU  Anesthesia Type:General  Level of Consciousness: awake and sedated  Airway & Oxygen Therapy: Patient Spontanous Breathing and Patient connected to face mask oxygen  Post-op Assessment: Report given to PACU RN and Post -op Vital signs reviewed and stable  Post vital signs: Reviewed and stable  Complications: No apparent anesthesia complications

## 2013-03-11 NOTE — Anesthesia Postprocedure Evaluation (Signed)
Anesthesia Post Note  Patient: Omar Sanders  Procedure(s) Performed: Procedure(s) (LRB): INSERTION OF MESH (Bilateral) LAPAROSCOPIC BILATERAL INGUINAL HERNIA REPAIR (Bilateral)  Anesthesia type: general  Patient location: PACU  Post pain: Pain level controlled  Post assessment: Patient's Cardiovascular Status Stable  Last Vitals:  Filed Vitals:   03/11/13 1200  BP:   Pulse: 68  Temp:   Resp: 18    Post vital signs: Reviewed and stable  Level of consciousness: sedated  Complications: No apparent anesthesia complications

## 2013-03-12 ENCOUNTER — Encounter (HOSPITAL_COMMUNITY): Payer: Self-pay | Admitting: General Surgery

## 2013-03-12 ENCOUNTER — Telehealth (INDEPENDENT_AMBULATORY_CARE_PROVIDER_SITE_OTHER): Payer: Self-pay

## 2013-03-12 ENCOUNTER — Emergency Department (HOSPITAL_COMMUNITY)
Admission: EM | Admit: 2013-03-12 | Discharge: 2013-03-12 | Disposition: A | Discharge status: Home / Self Care | Payer: PRIVATE HEALTH INSURANCE | Attending: Emergency Medicine | Admitting: Emergency Medicine

## 2013-03-12 DIAGNOSIS — F411 Generalized anxiety disorder: Secondary | ICD-10-CM | POA: Insufficient documentation

## 2013-03-12 DIAGNOSIS — Z87448 Personal history of other diseases of urinary system: Secondary | ICD-10-CM | POA: Insufficient documentation

## 2013-03-12 DIAGNOSIS — Z8669 Personal history of other diseases of the nervous system and sense organs: Secondary | ICD-10-CM | POA: Insufficient documentation

## 2013-03-12 DIAGNOSIS — Z8639 Personal history of other endocrine, nutritional and metabolic disease: Secondary | ICD-10-CM | POA: Insufficient documentation

## 2013-03-12 DIAGNOSIS — Y838 Other surgical procedures as the cause of abnormal reaction of the patient, or of later complication, without mention of misadventure at the time of the procedure: Secondary | ICD-10-CM | POA: Insufficient documentation

## 2013-03-12 DIAGNOSIS — IMO0002 Reserved for concepts with insufficient information to code with codable children: Secondary | ICD-10-CM | POA: Insufficient documentation

## 2013-03-12 DIAGNOSIS — R339 Retention of urine, unspecified: Secondary | ICD-10-CM | POA: Insufficient documentation

## 2013-03-12 DIAGNOSIS — R109 Unspecified abdominal pain: Secondary | ICD-10-CM | POA: Insufficient documentation

## 2013-03-12 DIAGNOSIS — T819XXA Unspecified complication of procedure, initial encounter: Secondary | ICD-10-CM

## 2013-03-12 DIAGNOSIS — K219 Gastro-esophageal reflux disease without esophagitis: Secondary | ICD-10-CM | POA: Insufficient documentation

## 2013-03-12 DIAGNOSIS — Z8679 Personal history of other diseases of the circulatory system: Secondary | ICD-10-CM | POA: Insufficient documentation

## 2013-03-12 DIAGNOSIS — N9989 Other postprocedural complications and disorders of genitourinary system: Secondary | ICD-10-CM | POA: Insufficient documentation

## 2013-03-12 DIAGNOSIS — Z79899 Other long term (current) drug therapy: Secondary | ICD-10-CM | POA: Insufficient documentation

## 2013-03-12 DIAGNOSIS — Z862 Personal history of diseases of the blood and blood-forming organs and certain disorders involving the immune mechanism: Secondary | ICD-10-CM | POA: Insufficient documentation

## 2013-03-12 DIAGNOSIS — I10 Essential (primary) hypertension: Secondary | ICD-10-CM | POA: Insufficient documentation

## 2013-03-12 LAB — CBC WITH DIFFERENTIAL/PLATELET
Basophils Absolute: 0 10*3/uL (ref 0.0–0.1)
Basophils Relative: 0 % (ref 0–1)
Eosinophils Absolute: 0.2 10*3/uL (ref 0.0–0.7)
Eosinophils Relative: 4 % (ref 0–5)
HCT: 30.5 % — ABNORMAL LOW (ref 39.0–52.0)
Hemoglobin: 10.3 g/dL — ABNORMAL LOW (ref 13.0–17.0)
Lymphocytes Relative: 27 % (ref 12–46)
Lymphs Abs: 1.6 10*3/uL (ref 0.7–4.0)
MCH: 26.3 pg (ref 26.0–34.0)
MCHC: 33.8 g/dL (ref 30.0–36.0)
MCV: 78 fL (ref 78.0–100.0)
Monocytes Absolute: 0.5 10*3/uL (ref 0.1–1.0)
Monocytes Relative: 9 % (ref 3–12)
Neutro Abs: 3.4 10*3/uL (ref 1.7–7.7)
Neutrophils Relative %: 60 % (ref 43–77)
Platelets: 156 10*3/uL (ref 150–400)
RBC: 3.91 MIL/uL — ABNORMAL LOW (ref 4.22–5.81)
RDW: 13.3 % (ref 11.5–15.5)
WBC: 5.7 10*3/uL (ref 4.0–10.5)

## 2013-03-12 LAB — URINALYSIS, ROUTINE W REFLEX MICROSCOPIC
Bilirubin Urine: NEGATIVE
Glucose, UA: NEGATIVE mg/dL
Hgb urine dipstick: NEGATIVE
Ketones, ur: NEGATIVE mg/dL
Leukocytes, UA: NEGATIVE
Nitrite: NEGATIVE
Protein, ur: NEGATIVE mg/dL
Specific Gravity, Urine: 1.008 (ref 1.005–1.030)
Urobilinogen, UA: 0.2 mg/dL (ref 0.0–1.0)
pH: 6 (ref 5.0–8.0)

## 2013-03-12 LAB — COMPREHENSIVE METABOLIC PANEL
ALT: 45 U/L (ref 0–53)
AST: 34 U/L (ref 0–37)
Albumin: 3 g/dL — ABNORMAL LOW (ref 3.5–5.2)
Alkaline Phosphatase: 51 U/L (ref 39–117)
BUN: 20 mg/dL (ref 6–23)
CO2: 26 mEq/L (ref 19–32)
Calcium: 8.7 mg/dL (ref 8.4–10.5)
Chloride: 99 mEq/L (ref 96–112)
Creatinine, Ser: 1.35 mg/dL (ref 0.50–1.35)
GFR calc Af Amer: 59 mL/min — ABNORMAL LOW (ref >90)
GFR calc non Af Amer: 50 mL/min — ABNORMAL LOW (ref >90)
Glucose, Bld: 88 mg/dL (ref 70–99)
Potassium: 4 mEq/L (ref 3.5–5.1)
Sodium: 134 mEq/L — ABNORMAL LOW (ref 135–145)
Total Bilirubin: 0.4 mg/dL (ref 0.3–1.2)
Total Protein: 6.8 g/dL (ref 6.0–8.3)

## 2013-03-12 MED ORDER — OXYCODONE-ACETAMINOPHEN 5-325 MG PO TABS
1.0000 | ORAL_TABLET | Freq: Once | ORAL | Status: AC
  Administered 2013-03-12: 1 via ORAL
  Filled 2013-03-12: qty 1

## 2013-03-12 NOTE — Telephone Encounter (Signed)
Patients granddaughter called in stating patient was able to urinate a little bit and he has a little relief but is still having abdominal pain and distention. I advised her that he has been told several times today to go to ER to be evaluated. I told her that patient will have some pain from surgery since he is only a day post op but with his belly being distended he may not be emptying his bladder fully. I advised her to take him to the ER like he was told by Dr Biagio Quint, Huntley Dec RN, Dr Andrey Campanile and Marlowe Aschoff RN BSN and clinic manager. She said thank you and hung up.

## 2013-03-12 NOTE — ED Notes (Signed)
EDPA at bedside. Translator phone being used for assessment

## 2013-03-12 NOTE — ED Provider Notes (Signed)
Medical screening examination/treatment/procedure(s) were performed by non-physician practitioner and as supervising physician I was immediately available for consultation/collaboration.   Celene Kras, MD 03/12/13 2200

## 2013-03-12 NOTE — ED Notes (Signed)
Interpreter line used for leg bag return demonstration and discharge/follow-up instructions. Pt and pt's son demonstrated/verbalized understanding.  No prescriptions.

## 2013-03-12 NOTE — ED Notes (Signed)
Pt speaks vietnamese, and family with pt speaks english. C/o bilateral lower abdominal pain from inguinal hernia repair. Pt discharge yesterday and pain medications are not helping. Last BM yesterday.

## 2013-03-12 NOTE — ED Provider Notes (Signed)
History     CSN: [id number]  Arrival date & time 03/12/13  2006   First MD Initiated Contact with Patient 03/12/13 2023      Chief Complaint  Patient presents with  . Abdominal Pain    (Consider location/radiation/quality/duration/timing/severity/associated sxs/prior treatment) The history is provided by the patient, medical records and a relative. A language interpreter was used.   Omar Sanders is a 72 y.o. male that presents emergency department status post laparoscopic bilateral inguinal hernia repair with mesh that was performed yesterday by Dr. Axel Filler.  Patient presents complaining of pubic and lower abdomen fullness, inability to urinate, abdominal pressure, and testicular soreness.  Patient has attempted to alleviate pain with Percocet which have not worked.  Patient reports that he has not had a complete urination since the surgery.  Patient denies any fevers, night sweats, chills, bleeding from the surgical site. Past Medical History  Diagnosis Date  . GERD (gastroesophageal reflux disease)   . Hypertension   . Headache   . Neuromuscular disorder     periferal neuropathy per patient  . OSA (obstructive sleep apnea) 11/2009    sleep study with mild OSA, pt unwilling to use CPAP  . Anxiety   . Incontinent of urine     Hx:of  . Hyperlipidemia     Hx: of    Past Surgical History  Procedure Laterality Date  . Cardiac catheterization  05/2019    to   . Cholecystectomy    . Colonoscopy  2012    Dr Loreta Ave  . Insertion of mesh Bilateral 03/11/2013    Procedure: INSERTION OF MESH;  Surgeon: Axel Filler, MD;  Location: [facility] OR;  Service: General;  Laterality: Bilateral;  . Inguinal hernia repair Bilateral 03/11/2013    Procedure: LAPAROSCOPIC BILATERAL INGUINAL HERNIA REPAIR;  Surgeon: Axel Filler, MD;  Location: [facility] OR;  Service: General;  Laterality: Bilateral;    No family history on file.  History  Substance Use Topics  . Smoking status: Never Smoker   .  Smokeless tobacco: Never Used  . Alcohol Use: No      Review of Systems Ten systems reviewed and are negative for acute change, except as noted in the HPI.    Allergies  Aspirin and Vitamin c  Home Medications   Current Outpatient Rx  Name  Route  Sig  Dispense  Refill  . acetaminophen-codeine (TYLENOL #3) 300-30 MG per tablet   Oral   Take 1 tablet by mouth 2 (two) times daily as needed for pain.         . cloNIDine (CATAPRES) 0.1 MG tablet   Oral   Take 0.1 mg by mouth as needed (take with blood pressure is elevated).         Marland Kitchen dicyclomine (BENTYL) 20 MG tablet   Oral   Take 20 mg by mouth 3 (three) times daily.         Marland Kitchen gabapentin (NEURONTIN) 300 MG capsule   Oral   Take 300 mg by mouth 2 (two) times daily.          . lansoprazole (PREVACID) 30 MG capsule   Oral   Take 30 mg by mouth daily.         Marland Kitchen lubiprostone (AMITIZA) 8 MCG capsule   Oral   Take 8 mcg by mouth 2 (two) times daily with a meal.         . oxycodone-acetaminophen (PERCOCET) 2.5-325 MG per tablet   Oral  Take 1 tablet by mouth every 4 (four) hours as needed for pain.   30 tablet   0   . QUEtiapine (SEROQUEL) 50 MG tablet   Oral   Take 50 mg by mouth at bedtime.         . traZODone (DESYREL) 50 MG tablet   Oral   Take 50 mg by mouth at bedtime.         . triazolam (HALCION) 0.25 MG tablet   Oral   Take 0.25 mg by mouth at bedtime.            BP 131/76  Pulse 72  Temp(Src) 97.4 F (36.3 C) (Oral)  Resp 24  SpO2 99%  Physical Exam  Nursing note and vitals reviewed. Constitutional: He is oriented to person, place, and time. He appears well-developed and well-nourished. No distress.  HENT:  Head: Normocephalic and atraumatic.  Eyes: Conjunctivae and EOM are normal.  Neck: Normal range of motion.  Pulmonary/Chest: Effort normal.  Abdominal:  Distended abdomen with diffuse lower abdominal tenderness to palpation.  No peritoneal signs.  Genitourinary:  Exam  chaperoned.  Testicular and inguinal tenderness to palpation.  Normal bruising after surgery.  No visible urethral obstruction.  Musculoskeletal: Normal range of motion.  Neurological: He is alert and oriented to person, place, and time.  Skin: Skin is warm and dry. No rash noted. He is not diaphoretic.  Abdominal laparoscopic surgical scars currently healing without signs of warmth erythema or purulent drainage.  Psychiatric: He has a normal mood and affect. His behavior is normal.    ED Course  Procedures (including critical care time)  Labs Reviewed  CBC WITH DIFFERENTIAL - Abnormal; Notable for the following:    RBC 3.91 (*)    Hemoglobin 10.3 (*)    HCT 30.5 (*)    All other components within normal limits  COMPREHENSIVE METABOLIC PANEL - Abnormal; Notable for the following:    Sodium 134 (*)    Albumin 3.0 (*)    GFR calc non Af Amer 50 (*)    GFR calc Af Amer 59 (*)    All other components within normal limits  URINALYSIS, ROUTINE W REFLEX MICROSCOPIC   No results found.   No diagnosis found.    MDM  Post surgical urinary retention Pt to ER w abdominal fullness and urinary retention. Foley placed, 300 cc removed. UA without signs of infection, culture pending. Laparoscopic surgical scars healing well without signs of infection. Typical post surgical sourness present. Pt sent home with foley catheter and urology follow up. Interpreter used, pt verbalizes understanding. Chart routed to Dr. Derrell Lolling who performed the laparoscopic inguinal hernia repair yesterday.         Jaci Carrel, [place] 03/12/13 2129

## 2013-03-12 NOTE — Telephone Encounter (Signed)
The pt called reporting that he can't urinate and his belly is distended.  He called last night and spoke to Dr Biagio Quint and was told to go to the ER.  He did not go thinking his insurance will not cover it.  I told him there is nothing we can do in the office for this.  I spoke to Dr Andrey Campanile because Dr Derrell Lolling his surgeon is unavailable.  Dr Andrey Campanile said he must go to the ER and get a foley and be put on Flomax.  The declined and said he can't afford it and [facility] won't cover it what can he do.  I repeatedly told him he must go to the ER to get help.  There is nothing here for [place] to help him.  He still asked me what to do .  I had my manager Marlowe Aschoff speak with him and she again told him he must go to the ER and get a catheter and his bladder scanned.  He asked for [facility] to pay for his ER visit and we declined to pay for his visit.  She told him he can go and talk to a Artist about payment plans.  They will work with him.  She said he must go to the ER.

## 2013-03-13 ENCOUNTER — Telehealth (INDEPENDENT_AMBULATORY_CARE_PROVIDER_SITE_OTHER): Payer: Self-pay | Admitting: *Deleted

## 2013-03-13 NOTE — Telephone Encounter (Signed)
Patient called to state he is still bloated.  When asked patient states he has not had a bowel movement.  Patient did go to the ED yesterday and had a foley placed.  Patient instructed to use Milk of Mag every 6 hours to try to induce a bowel movement.  Patient understands to call me back tomorrow at 4p with update.

## 2013-03-14 ENCOUNTER — Emergency Department (HOSPITAL_COMMUNITY): Payer: PRIVATE HEALTH INSURANCE

## 2013-03-14 ENCOUNTER — Encounter (HOSPITAL_COMMUNITY): Payer: Self-pay | Admitting: Emergency Medicine

## 2013-03-14 ENCOUNTER — Observation Stay (HOSPITAL_COMMUNITY)
Admission: EM | Admit: 2013-03-14 | Discharge: 2013-03-17 | Disposition: A | Discharge status: Home / Self Care | Payer: PRIVATE HEALTH INSURANCE | Attending: General Surgery | Admitting: General Surgery

## 2013-03-14 DIAGNOSIS — R5383 Other fatigue: Secondary | ICD-10-CM | POA: Insufficient documentation

## 2013-03-14 DIAGNOSIS — R935 Abnormal findings on diagnostic imaging of other abdominal regions, including retroperitoneum: Secondary | ICD-10-CM | POA: Insufficient documentation

## 2013-03-14 DIAGNOSIS — Q619 Cystic kidney disease, unspecified: Secondary | ICD-10-CM | POA: Insufficient documentation

## 2013-03-14 DIAGNOSIS — G8918 Other acute postprocedural pain: Principal | ICD-10-CM

## 2013-03-14 DIAGNOSIS — G4733 Obstructive sleep apnea (adult) (pediatric): Secondary | ICD-10-CM | POA: Insufficient documentation

## 2013-03-14 DIAGNOSIS — R5381 Other malaise: Secondary | ICD-10-CM | POA: Insufficient documentation

## 2013-03-14 DIAGNOSIS — R51 Headache: Secondary | ICD-10-CM | POA: Insufficient documentation

## 2013-03-14 DIAGNOSIS — I10 Essential (primary) hypertension: Secondary | ICD-10-CM | POA: Insufficient documentation

## 2013-03-14 DIAGNOSIS — R141 Gas pain: Secondary | ICD-10-CM | POA: Insufficient documentation

## 2013-03-14 DIAGNOSIS — R109 Unspecified abdominal pain: Secondary | ICD-10-CM

## 2013-03-14 DIAGNOSIS — R142 Eructation: Secondary | ICD-10-CM | POA: Insufficient documentation

## 2013-03-14 DIAGNOSIS — F411 Generalized anxiety disorder: Secondary | ICD-10-CM | POA: Insufficient documentation

## 2013-03-14 DIAGNOSIS — K219 Gastro-esophageal reflux disease without esophagitis: Secondary | ICD-10-CM | POA: Insufficient documentation

## 2013-03-14 LAB — CBC WITH DIFFERENTIAL/PLATELET
Basophils Absolute: 0 10*3/uL (ref 0.0–0.1)
Basophils Relative: 0 % (ref 0–1)
Eosinophils Absolute: 0.2 K/uL (ref 0.0–0.7)
Eosinophils Relative: 2 % (ref 0–5)
HCT: 34.1 % — ABNORMAL LOW (ref 39.0–52.0)
Hemoglobin: 11.6 g/dL — ABNORMAL LOW (ref 13.0–17.0)
Lymphocytes Relative: 20 % (ref 12–46)
Lymphs Abs: 1.3 10*3/uL (ref 0.7–4.0)
MCH: 27 pg (ref 26.0–34.0)
MCHC: 34 g/dL (ref 30.0–36.0)
MCV: 79.3 fL (ref 78.0–100.0)
Monocytes Absolute: 0.6 10*3/uL (ref 0.1–1.0)
Monocytes Relative: 9 % (ref 3–12)
Neutro Abs: 4.6 10*3/uL (ref 1.7–7.7)
Neutrophils Relative %: 69 % (ref 43–77)
Platelets: 168 10*3/uL (ref 150–400)
RBC: 4.3 MIL/uL (ref 4.22–5.81)
RDW: 13.8 % (ref 11.5–15.5)
WBC: 6.7 10*3/uL (ref 4.0–10.5)

## 2013-03-14 LAB — URINALYSIS, ROUTINE W REFLEX MICROSCOPIC
Bilirubin Urine: NEGATIVE
Glucose, UA: NEGATIVE mg/dL
Ketones, ur: NEGATIVE mg/dL
Nitrite: NEGATIVE
Protein, ur: NEGATIVE mg/dL
Specific Gravity, Urine: 1.013 (ref 1.005–1.030)
Urobilinogen, UA: 0.2 mg/dL (ref 0.0–1.0)
pH: 7.5 (ref 5.0–8.0)

## 2013-03-14 LAB — COMPREHENSIVE METABOLIC PANEL
ALT: 44 U/L (ref 0–53)
AST: 35 U/L (ref 0–37)
Albumin: 3.9 g/dL (ref 3.5–5.2)
Alkaline Phosphatase: 57 U/L (ref 39–117)
BUN: 14 mg/dL (ref 6–23)
CO2: 28 mEq/L (ref 19–32)
Calcium: 9.2 mg/dL (ref 8.4–10.5)
Chloride: 100 mEq/L (ref 96–112)
Creatinine, Ser: 1.09 mg/dL (ref 0.50–1.35)
GFR calc Af Amer: 76 mL/min — ABNORMAL LOW (ref >90)
GFR calc non Af Amer: 65 mL/min — ABNORMAL LOW (ref >90)
Glucose, Bld: 99 mg/dL (ref 70–99)
Potassium: 3.9 mEq/L (ref 3.5–5.1)
Sodium: 138 mEq/L (ref 135–145)
Total Bilirubin: 0.5 mg/dL (ref 0.3–1.2)
Total Protein: 7.6 g/dL (ref 6.0–8.3)

## 2013-03-14 LAB — LIPASE, BLOOD: Lipase: 40 U/L (ref 11–59)

## 2013-03-14 LAB — URINE MICROSCOPIC-ADD ON

## 2013-03-14 MED ORDER — FENTANYL CITRATE 0.05 MG/ML IJ SOLN
50.0000 ug | INTRAMUSCULAR | Status: DC | PRN
  Administered 2013-03-14: 50 ug via INTRAVENOUS
  Filled 2013-03-14: qty 2

## 2013-03-14 MED ORDER — SODIUM CHLORIDE 0.9 % IJ SOLN
10.0000 mL | INTRAMUSCULAR | Status: DC | PRN
  Administered 2013-03-14: 10 mL via INTRAVENOUS

## 2013-03-14 MED ORDER — IOHEXOL 300 MG/ML  SOLN
50.0000 mL | Freq: Once | INTRAMUSCULAR | Status: AC | PRN
  Administered 2013-03-14: 50 mL via ORAL

## 2013-03-14 MED ORDER — ONDANSETRON HCL 4 MG/2ML IJ SOLN: 4.0000 mg | Freq: Once | INTRAMUSCULAR | Status: DC

## 2013-03-14 NOTE — ED Notes (Signed)
Pt c/o abd distention and bloating; pt has foley cath with leg back that is draining urine per pt; pt speaks vietnamese

## 2013-03-14 NOTE — ED Provider Notes (Signed)
History     CSN: [id number]  Arrival date & time 03/14/13  1703   First MD Initiated Contact with Patient 03/14/13 2215      Chief Complaint  Patient presents with  . Abdominal Pain    (Consider location/radiation/quality/duration/timing/severity/associated sxs/prior treatment) HPI Comments: Pt is s/p recent bilateral direct inguinal hernia repairs by Dr. Derrell Lolling, was seen in the ED a couple of days ago and had foley catheter placed for abd pain and distention.  Pt reports it has been draining, reports increase in pain today.  No N/V.  No fevers.  Reports decrease in passing gas, had a BM this morning.  Has been taking home meds including percocet without much improvement.  Lvel 5 caveat due to severe pain and language barrier.  Patient is a 72 y.o. male presenting with abdominal pain. The history is provided by the patient and medical records. The history is limited by a language barrier. No language interpreter was used.  Abdominal Pain   Past Medical History  Diagnosis Date  . GERD (gastroesophageal reflux disease)   . Hypertension   . Headache   . Neuromuscular disorder     periferal neuropathy per patient  . OSA (obstructive sleep apnea) 11/2009    sleep study with mild OSA, pt unwilling to use CPAP  . Anxiety   . Incontinent of urine     Hx:of  . Hyperlipidemia     Hx: of    Past Surgical History  Procedure Laterality Date  . Cardiac catheterization  05/2019    to   . Cholecystectomy    . Colonoscopy  2012    Dr Loreta Ave  . Insertion of mesh Bilateral 03/11/2013    Procedure: INSERTION OF MESH;  Surgeon: Axel Filler, MD;  Location: [facility] OR;  Service: General;  Laterality: Bilateral;  . Inguinal hernia repair Bilateral 03/11/2013    Procedure: LAPAROSCOPIC BILATERAL INGUINAL HERNIA REPAIR;  Surgeon: Axel Filler, MD;  Location: [facility] OR;  Service: General;  Laterality: Bilateral;    History reviewed. No pertinent family history.  History  Substance Use Topics  .  Smoking status: Never Smoker   . Smokeless tobacco: Never Used  . Alcohol Use: No      Review of Systems  Unable to perform ROS: Acuity of condition  Gastrointestinal: Positive for abdominal pain.    Allergies  Aspirin and Vitamin c  Home Medications   Current Outpatient Rx  Name  Route  Sig  Dispense  Refill  . acetaminophen-codeine (TYLENOL #3) 300-30 MG per tablet   Oral   Take 1 tablet by mouth 2 (two) times daily as needed for pain.         . cloNIDine (CATAPRES) 0.1 MG tablet   Oral   Take 0.1 mg by mouth as needed (take with blood pressure is elevated).         Marland Kitchen dicyclomine (BENTYL) 20 MG tablet   Oral   Take 20 mg by mouth 3 (three) times daily.         Marland Kitchen gabapentin (NEURONTIN) 300 MG capsule   Oral   Take 300 mg by mouth 2 (two) times daily.          . lansoprazole (PREVACID) 30 MG capsule   Oral   Take 30 mg by mouth daily.         Marland Kitchen lubiprostone (AMITIZA) 8 MCG capsule   Oral   Take 8 mcg by mouth 2 (two) times daily with a meal.         .  oxycodone-acetaminophen (PERCOCET) 2.5-325 MG per tablet   Oral   Take 1 tablet by mouth every 4 (four) hours as needed for pain.   30 tablet   0   . QUEtiapine (SEROQUEL) 50 MG tablet   Oral   Take 50 mg by mouth at bedtime.         . traZODone (DESYREL) 50 MG tablet   Oral   Take 50 mg by mouth at bedtime.         . triazolam (HALCION) 0.25 MG tablet   Oral   Take 0.25 mg by mouth at bedtime.            BP 154/93  Pulse 75  Temp(Src) 97 F (36.1 C) (Oral)  Resp 22  SpO2 100%  Physical Exam  Nursing note and vitals reviewed. Constitutional: He is oriented to person, place, and time. He appears well-developed and well-nourished.  HENT:  Head: Normocephalic and atraumatic.  Eyes: EOM are normal.  Neck: Normal range of motion. Neck supple.  Cardiovascular: Normal rate and intact distal pulses.   No murmur heard. Pulmonary/Chest: Effort normal.  Abdominal: Soft. There is  tenderness. There is guarding.  Neurological: He is alert and oriented to person, place, and time. Coordination normal.  Skin: Skin is warm.    ED Course  Procedures (including critical care time)  Labs Reviewed  COMPREHENSIVE METABOLIC PANEL - Abnormal; Notable for the following:    GFR calc non Af Amer 65 (*)    GFR calc Af Amer 76 (*)    All other components within normal limits  CBC WITH DIFFERENTIAL - Abnormal; Notable for the following:    Hemoglobin 11.6 (*)    HCT 34.1 (*)    All other components within normal limits  URINALYSIS, ROUTINE W REFLEX MICROSCOPIC - Abnormal; Notable for the following:    Hgb urine dipstick SMALL (*)    Leukocytes, UA TRACE (*)    All other components within normal limits  URINE MICROSCOPIC-ADD ON - Abnormal; Notable for the following:    Bacteria, UA FEW (*)    All other components within normal limits  URINE CULTURE  LIPASE, BLOOD   No results found.   No diagnosis found.  ra sat is 100% and I interpret to be normal  12:30 AM Awaiting CT results, signed out to Dr  Lamar Sprinkles re-eval and follow up on CT.  Impression: Abdominal pain Pod #3 following bilateral hernia repairs   MDM  Pt with periumbilical and diffuse lower abd pain, guarding.  Bedside U/S shows decompressed bladder.  No urine in foley bag currently, however there was urine in bag upon arrival to triage.  Will give analgesics, obtain plain films, may need CT scan.  Pt with sig pain and tenderness.  Will irrigate catheter to ensure that it is flowing.        Gavin Pound. Shaiann Mcmanamon, MD 03/15/13 1406

## 2013-03-14 NOTE — ED Notes (Signed)
Leg bag changed to a regular foley drainage bag.  Dr. Oletta Lamas at bedside to do an ultrasound of bladder..very little urine.

## 2013-03-15 ENCOUNTER — Emergency Department (HOSPITAL_COMMUNITY): Payer: PRIVATE HEALTH INSURANCE

## 2013-03-15 ENCOUNTER — Encounter (HOSPITAL_COMMUNITY): Payer: Self-pay | Admitting: Radiology

## 2013-03-15 DIAGNOSIS — K929 Disease of digestive system, unspecified: Secondary | ICD-10-CM

## 2013-03-15 LAB — URINE CULTURE
Colony Count: NO GROWTH
Culture: NO GROWTH

## 2013-03-15 LAB — CBC
HCT: 30.8 % — ABNORMAL LOW (ref 39.0–52.0)
Hemoglobin: 10.1 g/dL — ABNORMAL LOW (ref 13.0–17.0)
MCH: 26 pg (ref 26.0–34.0)
MCHC: 32.8 g/dL (ref 30.0–36.0)
MCV: 79.2 fL (ref 78.0–100.0)
Platelets: 171 10*3/uL (ref 150–400)
RBC: 3.89 MIL/uL — ABNORMAL LOW (ref 4.22–5.81)
RDW: 13.7 % (ref 11.5–15.5)
WBC: 5.7 10*3/uL (ref 4.0–10.5)

## 2013-03-15 LAB — BASIC METABOLIC PANEL
BUN: 15 mg/dL (ref 6–23)
CO2: 27 mEq/L (ref 19–32)
Calcium: 8.5 mg/dL (ref 8.4–10.5)
Chloride: 102 mEq/L (ref 96–112)
Creatinine, Ser: 1.07 mg/dL (ref 0.50–1.35)
GFR calc Af Amer: 77 mL/min — ABNORMAL LOW (ref >90)
GFR calc non Af Amer: 67 mL/min — ABNORMAL LOW (ref >90)
Glucose, Bld: 102 mg/dL — ABNORMAL HIGH (ref 70–99)
Potassium: 3.5 mEq/L (ref 3.5–5.1)
Sodium: 138 mEq/L (ref 135–145)

## 2013-03-15 MED ORDER — TRIAZOLAM 0.25 MG PO TABS: 0.2500 mg | ORAL_TABLET | Freq: Every day | ORAL | Status: DC

## 2013-03-15 MED ORDER — LUBIPROSTONE 8 MCG PO CAPS
8.0000 ug | ORAL_CAPSULE | Freq: Two times a day (BID) | ORAL | Status: DC
  Administered 2013-03-15 – 2013-03-17 (×5): 8 ug via ORAL
  Filled 2013-03-15 (×8): qty 1

## 2013-03-15 MED ORDER — DICYCLOMINE HCL 20 MG PO TABS
20.0000 mg | ORAL_TABLET | Freq: Three times a day (TID) | ORAL | Status: DC
  Administered 2013-03-15 – 2013-03-17 (×7): 20 mg via ORAL
  Filled 2013-03-15 (×9): qty 1

## 2013-03-15 MED ORDER — OXYCODONE-ACETAMINOPHEN 2.5-325 MG PO TABS: 1.0000 | ORAL_TABLET | ORAL | Status: DC | PRN

## 2013-03-15 MED ORDER — CLONIDINE HCL 0.1 MG PO TABS: 0.1000 mg | ORAL_TABLET | ORAL | Status: DC | PRN

## 2013-03-15 MED ORDER — MORPHINE SULFATE 4 MG/ML IJ SOLN
4.0000 mg | INTRAMUSCULAR | Status: DC | PRN
  Administered 2013-03-15: 4 mg via INTRAVENOUS
  Filled 2013-03-15: qty 1

## 2013-03-15 MED ORDER — PANTOPRAZOLE SODIUM 40 MG IV SOLR: 40.0000 mg | Freq: Every day | INTRAVENOUS | Status: DC

## 2013-03-15 MED ORDER — KCL IN DEXTROSE-NACL 20-5-0.9 MEQ/L-%-% IV SOLN
INTRAVENOUS | Status: DC
  Administered 2013-03-15: 04:00:00 via INTRAVENOUS
  Administered 2013-03-16: 10 mL/h via INTRAVENOUS
  Filled 2013-03-15 (×4): qty 1000

## 2013-03-15 MED ORDER — IOHEXOL 300 MG/ML  SOLN
100.0000 mL | Freq: Once | INTRAMUSCULAR | Status: AC | PRN
  Administered 2013-03-15: 100 mL via INTRAVENOUS

## 2013-03-15 MED ORDER — QUETIAPINE FUMARATE 50 MG PO TABS
50.0000 mg | ORAL_TABLET | Freq: Every day | ORAL | Status: DC
  Administered 2013-03-15 – 2013-03-16 (×3): 50 mg via ORAL
  Filled 2013-03-15 (×4): qty 1

## 2013-03-15 MED ORDER — ONDANSETRON HCL 4 MG/2ML IJ SOLN
4.0000 mg | Freq: Four times a day (QID) | INTRAMUSCULAR | Status: DC | PRN
  Administered 2013-03-15: 4 mg via INTRAVENOUS
  Filled 2013-03-15: qty 2

## 2013-03-15 MED ORDER — HEPARIN SODIUM (PORCINE) 5000 UNIT/ML IJ SOLN
5000.0000 [IU] | Freq: Three times a day (TID) | INTRAMUSCULAR | Status: DC
  Administered 2013-03-15 – 2013-03-17 (×6): 5000 [IU] via SUBCUTANEOUS
  Filled 2013-03-15 (×10): qty 1

## 2013-03-15 MED ORDER — GABAPENTIN 300 MG PO CAPS
300.0000 mg | ORAL_CAPSULE | Freq: Two times a day (BID) | ORAL | Status: DC
  Administered 2013-03-15 – 2013-03-17 (×5): 300 mg via ORAL
  Filled 2013-03-15 (×7): qty 1

## 2013-03-15 MED ORDER — OXYCODONE-ACETAMINOPHEN 5-325 MG PO TABS
0.5000 | ORAL_TABLET | ORAL | Status: DC | PRN
  Administered 2013-03-15 – 2013-03-17 (×3): 0.5 via ORAL
  Filled 2013-03-15 (×3): qty 1

## 2013-03-15 MED ORDER — PANTOPRAZOLE SODIUM 40 MG PO TBEC
40.0000 mg | DELAYED_RELEASE_TABLET | Freq: Every day | ORAL | Status: DC
  Administered 2013-03-15 – 2013-03-17 (×4): 40 mg via ORAL
  Filled 2013-03-15 (×4): qty 1

## 2013-03-15 MED ORDER — TRAZODONE HCL 50 MG PO TABS
50.0000 mg | ORAL_TABLET | Freq: Every day | ORAL | Status: DC
  Administered 2013-03-15 – 2013-03-16 (×2): 50 mg via ORAL
  Filled 2013-03-15 (×3): qty 1

## 2013-03-15 MED ORDER — ZOLPIDEM TARTRATE 5 MG PO TABS
5.0000 mg | ORAL_TABLET | Freq: Every day | ORAL | Status: DC
  Administered 2013-03-15 – 2013-03-16 (×2): 5 mg via ORAL
  Filled 2013-03-15 (×2): qty 1

## 2013-03-15 NOTE — ED Notes (Signed)
Dr. Carolynne Edouard at [facility], speaking with pt & family, admission explained. Alert, NAD, calm, interactive.

## 2013-03-15 NOTE — H&P (Signed)
Omar Sanders is an 72 y.o. male.   Chief Complaint: bloating HPI: The pt is a 72 yo male who is 3 days s/p lap bilateral inguinal hernia repairs with mesh by Dr. Derrell Lolling. He returns today complaining of abdominal pain and bloating. No fever. He is now hungry  Past Medical History  Diagnosis Date  . GERD (gastroesophageal reflux disease)   . Hypertension   . Headache   . Neuromuscular disorder     periferal neuropathy per patient  . OSA (obstructive sleep apnea) 11/2009    sleep study with mild OSA, pt unwilling to use CPAP  . Anxiety   . Incontinent of urine     Hx:of  . Hyperlipidemia     Hx: of    Past Surgical History  Procedure Laterality Date  . Cardiac catheterization  05/2019    to   . Cholecystectomy    . Colonoscopy  2012    Dr Loreta Ave  . Insertion of mesh Bilateral 03/11/2013    Procedure: INSERTION OF MESH;  Surgeon: Axel Filler, MD;  Location: [facility] OR;  Service: General;  Laterality: Bilateral;  . Inguinal hernia repair Bilateral 03/11/2013    Procedure: LAPAROSCOPIC BILATERAL INGUINAL HERNIA REPAIR;  Surgeon: Axel Filler, MD;  Location: [facility] OR;  Service: General;  Laterality: Bilateral;    History reviewed. No pertinent family history. Social History:  reports that he has never smoked. He has never used smokeless tobacco. He reports that he does not drink alcohol or use illicit drugs.  Allergies:  Allergies  Allergen Reactions  . Aspirin     Unknown  . Vitamin C     Unknown     (Not in a hospital admission)  Results for orders placed during the hospital encounter of 03/14/13 (from the past 48 hour(s))  COMPREHENSIVE METABOLIC PANEL     Status: Abnormal   Collection Time    03/14/13  5:17 PM      Result Value Range   Sodium 138  135 - 145 mEq/L   Potassium 3.9  3.5 - 5.1 mEq/L   Chloride 100  96 - 112 mEq/L   CO2 28  19 - 32 mEq/L   Glucose, Bld 99  70 - 99 mg/dL   BUN 14  6 - 23 mg/dL   Creatinine, Ser 1.61  0.50 - 1.35 mg/dL   Calcium 9.2  8.4 -  09.6 mg/dL   Total Protein 7.6  6.0 - 8.3 g/dL   Albumin 3.9  3.5 - 5.2 g/dL   AST 35  0 - 37 U/L   ALT 44  0 - 53 U/L   Alkaline Phosphatase 57  39 - 117 U/L   Total Bilirubin 0.5  0.3 - 1.2 mg/dL   GFR calc non Af Amer 65 (*) >90 mL/min   GFR calc Af Amer 76 (*) >90 mL/min   Comment:            The eGFR has been calculated     using the CKD EPI equation.     This calculation has not been     validated in all clinical     situations.     eGFR's persistently     <90 mL/min signify     possible Chronic Kidney Disease.  CBC WITH DIFFERENTIAL     Status: Abnormal   Collection Time    03/14/13  5:17 PM      Result Value Range   WBC 6.7  4.0 - 10.5 K/uL  RBC 4.30  4.22 - 5.81 MIL/uL   Hemoglobin 11.6 (*) 13.0 - 17.0 g/dL   HCT 11.9 (*) 14.7 - 82.9 %   MCV 79.3  78.0 - 100.0 fL   MCH 27.0  26.0 - 34.0 pg   MCHC 34.0  30.0 - 36.0 g/dL   RDW 56.2  13.0 - 86.5 %   Platelets 168  150 - 400 K/uL   Neutrophils Relative % 69  43 - 77 %   Neutro Abs 4.6  1.7 - 7.7 K/uL   Lymphocytes Relative 20  12 - 46 %   Lymphs Abs 1.3  0.7 - 4.0 K/uL   Monocytes Relative 9  3 - 12 %   Monocytes Absolute 0.6  0.1 - 1.0 K/uL   Eosinophils Relative 2  0 - 5 %   Eosinophils Absolute 0.2  0.0 - 0.7 K/uL   Basophils Relative 0  0 - 1 %   Basophils Absolute 0.0  0.0 - 0.1 K/uL  LIPASE, BLOOD     Status: None   Collection Time    03/14/13  5:17 PM      Result Value Range   Lipase 40  11 - 59 U/L  URINALYSIS, ROUTINE W REFLEX MICROSCOPIC     Status: Abnormal   Collection Time    03/14/13  5:23 PM      Result Value Range   Color, Urine YELLOW  YELLOW   APPearance CLEAR  CLEAR   Specific Gravity, Urine 1.013  1.005 - 1.030   pH 7.5  5.0 - 8.0   Glucose, UA NEGATIVE  NEGATIVE mg/dL   Hgb urine dipstick SMALL (*) NEGATIVE   Bilirubin Urine NEGATIVE  NEGATIVE   Ketones, ur NEGATIVE  NEGATIVE mg/dL   Protein, ur NEGATIVE  NEGATIVE mg/dL   Urobilinogen, UA 0.2  0.0 - 1.0 mg/dL   Nitrite NEGATIVE   NEGATIVE   Leukocytes, UA TRACE (*) NEGATIVE  URINE MICROSCOPIC-ADD ON     Status: Abnormal   Collection Time    03/14/13  5:23 PM      Result Value Range   Squamous Epithelial / LPF RARE  RARE   WBC, UA 3-6  <3 WBC/hpf   RBC / HPF 11-20  <3 RBC/hpf   Bacteria, UA FEW (*) RARE   Ct Abdomen Pelvis W Contrast  03/15/2013   *RADIOLOGY REPORT*  Clinical Data: Abdominal distention and bloating.  CT ABDOMEN AND PELVIS WITH CONTRAST  Technique:  Multidetector CT imaging of the abdomen and pelvis was performed following the standard protocol during bolus administration of intravenous contrast.  Contrast: OMNIPAQUE IOHEXOL 300 MG/ML  SOLN  Comparison: 03/14/2013; 10/31/2012  Findings: Abnormal loculations of gas tracking the anterior mediastinum, between the left lateral abdominal wall musculature, along left retroperitoneum, and with a apparent collection of gas within or along the wall of the proximal descending colon.  There is some subcutaneous gas along the lower abdominal wall in addition to small loculations gas along the spermatic cords.  Extra luminal gas noted tracking along the esophageal hiatus.  Bilateral renal cysts are similar to prior.  Foley catheter noted in the urinary bladder.  Aortoiliac atherosclerosis noted.  Thick-walled gastric antrum observed.  Fluid filled colon noted. Postoperative appearance of the hernia mesh repair.  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  No overtly dilated small bowel.  Orally administered contrast extends through much of the small bowel does not reach the distal-most small bowel or colon.  Terminal  ileum unremarkable.  Left eccentric disc protrusion at L3-4.  Mild disc bulge at L4-5.  No additional significant findings.  IMPRESSION:  1. Small locules in the inguinal regions and adjacent subcutaneous tissues might be ascribed to normal postoperative gas, but there is also gas tracking in the retroperitoneum, left lateral abdominal wall  musculature, and a significant amount of gas tracking up into the mediastinum anteriorly, with a potential collection of gas along the proximal descending colon.  Amount and distribution seems excessive and may indicate some form of gas leak or valve mechanism. Tracheobronchial injury with pneumomediastinum tracking down into the retroperitoneum is a differential diagnostic consideration.  I would doubt that the colon is the origin although there is a collection of gas adjacent to the colon. It might be prudent to obtain CT scan of the chest to further assess the degree of pneumomediastinum, although the pneumomediastinum is not readily visible on the radiographs. 2.  Abnormal gastric antral thickening, suspicious for antritis. 3.  Renal cysts. 4.  Atherosclerosis. 5.  Air-fluid levels in the colon and small bowel, probably postoperative ileus, correlate with return of bowel function after surgery.  Diarrheal process is a differential diagnostic consideration.   Original Report Authenticated By: Gaylyn Rong, M.D.   Dg Abd Acute W/chest  03/15/2013   *RADIOLOGY REPORT*  Clinical Data: Right lower abdominal pain.  Weakness.  ACUTE ABDOMEN SERIES (ABDOMEN 2 VIEW & CHEST 1 VIEW)  Comparison: 10/31/2012  Findings: Cardiac and mediastinal contours appear normal.  The lungs appear clear.  No pleural effusion is identified.  No free peroneal gas observed. Suspected oral contrast medium in the stomach.  Borderline dilated loop of small bowel in the left abdomen.  Scattered air-fluid levels in small bowel loops in the right abdomen.  Relative paucity of gas and stool in the colon.  IMPRESSION:  1.  Abnormal but nonspecific bowel gas pattern, with borderline dilated loop of small bowel in the left abdomen and scattered air- fluid levels in the right abdomen.  This could be a manifestation of ileus, enteritis, or conceivably even early obstruction.   Original Report Authenticated By: Gaylyn Rong, M.D.     Review of Systems  Constitutional: Negative.   HENT: Negative.   Eyes: Negative.   Respiratory: Negative.   Cardiovascular: Negative.   Gastrointestinal: Positive for abdominal pain.  Genitourinary: Negative.   Musculoskeletal: Negative.   Skin: Negative.   Neurological: Negative.   Endo/Heme/Allergies: Negative.   Psychiatric/Behavioral: Negative.     Blood pressure 154/93, pulse 75, temperature 97 F (36.1 C), temperature source Oral, resp. rate 22, SpO2 100.00%. Physical Exam  Constitutional: He is oriented to person, place, and time. He appears well-developed and well-nourished.  HENT:  Head: Normocephalic and atraumatic.  Eyes: Conjunctivae and EOM are normal. Pupils are equal, round, and reactive to light.  Neck: Normal range of motion. Neck supple.  Cardiovascular: Normal rate, regular rhythm and normal heart sounds.   Respiratory: Effort normal and breath sounds normal.  GI: Soft. Bowel sounds are normal. He exhibits no distension. There is no tenderness. There is no guarding.  Musculoskeletal: Normal range of motion.  Neurological: He is alert and oriented to person, place, and time.  Skin: Skin is warm and dry.  Psychiatric: He has a normal mood and affect. His behavior is normal.     Assessment/Plan He appears to have an ileus after lap inguinal hernia repairs. I think the air in his soft tissue is from surgery. He seems to have a  benign abd with no fever, normal wbc and good bp and hr. Will admit for observation  TOTH III,Khush Pasion S 03/15/2013, 1:21 AM

## 2013-03-15 NOTE — Progress Notes (Signed)
Subjective: Better, less pain, no nausea, language barrier  Objective: Vital signs in last 24 hours: Temp:  [97 F (36.1 C)-98.2 F (36.8 C)] 97.4 F (36.3 C) (05/17 0955) Pulse Rate:  [57-79] 63 (05/17 0955) Resp:  [16-22] 16 (05/17 0955) BP: (103-154)/(57-93) 108/63 mmHg (05/17 0955) SpO2:  [97 %-100 %] 97 % (05/17 0955) Weight:  [66.225 kg (146 lb)] 66.225 kg (146 lb) (05/17 0250) Last BM Date: 03/14/13  Intake/Output from previous day: 05/16 0701 - 05/17 0700 In: 168.3 [I.V.:168.3] Out: 1290 [Urine:1290] Intake/Output this shift:    General appearance: alert and cooperative Resp: clear to auscultation bilaterally Cardio: regular rate and rhythm GI: soft, incisions CDI, NT, ND, +BS Foley  Lab Results:   Recent Labs  03/14/13 1717 03/15/13 0445  WBC 6.7 5.7  HGB 11.6* 10.1*  HCT 34.1* 30.8*  PLT 168 171   BMET  Recent Labs  03/14/13 1717 03/15/13 0445  NA 138 138  K 3.9 3.5  CL 100 102  CO2 28 27  GLUCOSE 99 102*  BUN 14 15  CREATININE 1.09 1.07  CALCIUM 9.2 8.5   PT/INR No results found for this basename: LABPROT, INR,  in the last 72 hours ABG No results found for this basename: PHART, PCO2, PO2, HCO3,  in the last 72 hours  Studies/Results: Ct Abdomen Pelvis W Contrast  03/15/2013   *RADIOLOGY REPORT*  Clinical Data: Abdominal distention and bloating.  CT ABDOMEN AND PELVIS WITH CONTRAST  Technique:  Multidetector CT imaging of the abdomen and pelvis was performed following the standard protocol during bolus administration of intravenous contrast.  Contrast: OMNIPAQUE IOHEXOL 300 MG/ML  SOLN  Comparison: 03/14/2013; 10/31/2012  Findings: Abnormal loculations of gas tracking the anterior mediastinum, between the left lateral abdominal wall musculature, along left retroperitoneum, and with a apparent collection of gas within or along the wall of the proximal descending colon.  There is some subcutaneous gas along the lower abdominal wall in  addition to small loculations gas along the spermatic cords.  Extra luminal gas noted tracking along the esophageal hiatus.  Bilateral renal cysts are similar to prior.  Foley catheter noted in the urinary bladder.  Aortoiliac atherosclerosis noted.  Thick-walled gastric antrum observed.  Fluid filled colon noted. Postoperative appearance of the hernia mesh repair.  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  No overtly dilated small bowel.  Orally administered contrast extends through much of the small bowel does not reach the distal-most small bowel or colon.  Terminal ileum unremarkable.  Left eccentric disc protrusion at L3-4.  Mild disc bulge at L4-5.  No additional significant findings.  IMPRESSION:  1. Small locules in the inguinal regions and adjacent subcutaneous tissues might be ascribed to normal postoperative gas, but there is also gas tracking in the retroperitoneum, left lateral abdominal wall musculature, and a significant amount of gas tracking up into the mediastinum anteriorly, with a potential collection of gas along the proximal descending colon.  Amount and distribution seems excessive and may indicate some form of gas leak or valve mechanism. Tracheobronchial injury with pneumomediastinum tracking down into the retroperitoneum is a differential diagnostic consideration.  I would doubt that the colon is the origin although there is a collection of gas adjacent to the colon. It might be prudent to obtain CT scan of the chest to further assess the degree of pneumomediastinum, although the pneumomediastinum is not readily visible on the radiographs. 2.  Abnormal gastric antral thickening, suspicious for antritis. 3.  Renal cysts. 4.  Atherosclerosis. 5.  Air-fluid levels in the colon and small bowel, probably postoperative ileus, correlate with return of bowel function after surgery.  Diarrheal process is a differential diagnostic consideration.   Original Report Authenticated By: Gaylyn Rong, M.D.   Dg Abd Acute W/chest  03/15/2013   *RADIOLOGY REPORT*  Clinical Data: Right lower abdominal pain.  Weakness.  ACUTE ABDOMEN SERIES (ABDOMEN 2 VIEW & CHEST 1 VIEW)  Comparison: 10/31/2012  Findings: Cardiac and mediastinal contours appear normal.  The lungs appear clear.  No pleural effusion is identified.  No free peroneal gas observed. Suspected oral contrast medium in the stomach.  Borderline dilated loop of small bowel in the left abdomen.  Scattered air-fluid levels in small bowel loops in the right abdomen.  Relative paucity of gas and stool in the colon.  IMPRESSION:  1.  Abnormal but nonspecific bowel gas pattern, with borderline dilated loop of small bowel in the left abdomen and scattered air- fluid levels in the right abdomen.  This could be a manifestation of ileus, enteritis, or conceivably even early obstruction.   Original Report Authenticated By: Gaylyn Rong, M.D.    Anti-infectives: Anti-infectives   None      Assessment/Plan: S/P lap BIH repair Post-op pain - exam now benign, CT likely represents post-op changes Advance diet Voiding trial in AM VTE - SQ hep [facility] for D/C 5/18  LOS: 1 day    La Shehan E 03/15/2013

## 2013-03-15 NOTE — ED Notes (Signed)
Contact pt daughter at [phone]

## 2013-03-15 NOTE — ED Notes (Signed)
Family just left [facility] for home.  Pt alert, NAD, calm, interactive: used language line: pt reports HA developing, also some shoulder pain, incision pain, some dizziness & legs feel cold. (Denies: sob or nausea).

## 2013-03-15 NOTE — ED Notes (Signed)
Given pillow & warm blanket.

## 2013-03-16 NOTE — Progress Notes (Signed)
Subjective: Still some soreness but had BM yesterday, getting up to walk this AM  Objective: Vital signs in last 24 hours: Temp:  [97.5 F (36.4 C)-98.2 F (36.8 C)] 98.2 F (36.8 C) (05/18 0611) Pulse Rate:  [61-73] 62 (05/18 0611) Resp:  [16-18] 16 (05/18 0611) BP: (109-128)/(65-79) 114/65 mmHg (05/18 0611) SpO2:  [98 %-100 %] 98 % (05/18 0611) Last BM Date: 03/15/13  Intake/Output from previous day: 05/17 0701 - 05/18 0700 In: 1104 [P.O.:480; I.V.:624] Out: 2600 [Urine:2600] Intake/Output this shift: Total I/O In: 360 [P.O.:360] Out: -   General appearance: alert and cooperative Resp: clear to auscultation bilaterally GI: soft, mild dist, +BS, expected tenderness only Male genitalia: foley  Lab Results:   Recent Labs  03/14/13 1717 03/15/13 0445  WBC 6.7 5.7  HGB 11.6* 10.1*  HCT 34.1* 30.8*  PLT 168 171   BMET  Recent Labs  03/14/13 1717 03/15/13 0445  NA 138 138  K 3.9 3.5  CL 100 102  CO2 28 27  GLUCOSE 99 102*  BUN 14 15  CREATININE 1.09 1.07  CALCIUM 9.2 8.5   PT/INR No results found for this basename: LABPROT, INR,  in the last 72 hours ABG No results found for this basename: PHART, PCO2, PO2, HCO3,  in the last 72 hours  Studies/Results: Ct Abdomen Pelvis W Contrast  03/15/2013   *RADIOLOGY REPORT*  Clinical Data: Abdominal distention and bloating.  CT ABDOMEN AND PELVIS WITH CONTRAST  Technique:  Multidetector CT imaging of the abdomen and pelvis was performed following the standard protocol during bolus administration of intravenous contrast.  Contrast: OMNIPAQUE IOHEXOL 300 MG/ML  SOLN  Comparison: 03/14/2013; 10/31/2012  Findings: Abnormal loculations of gas tracking the anterior mediastinum, between the left lateral abdominal wall musculature, along left retroperitoneum, and with a apparent collection of gas within or along the wall of the proximal descending colon.  There is some subcutaneous gas along the lower abdominal wall in  addition to small loculations gas along the spermatic cords.  Extra luminal gas noted tracking along the esophageal hiatus.  Bilateral renal cysts are similar to prior.  Foley catheter noted in the urinary bladder.  Aortoiliac atherosclerosis noted.  Thick-walled gastric antrum observed.  Fluid filled colon noted. Postoperative appearance of the hernia mesh repair.  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  No overtly dilated small bowel.  Orally administered contrast extends through much of the small bowel does not reach the distal-most small bowel or colon.  Terminal ileum unremarkable.  Left eccentric disc protrusion at L3-4.  Mild disc bulge at L4-5.  No additional significant findings.  IMPRESSION:  1. Small locules in the inguinal regions and adjacent subcutaneous tissues might be ascribed to normal postoperative gas, but there is also gas tracking in the retroperitoneum, left lateral abdominal wall musculature, and a significant amount of gas tracking up into the mediastinum anteriorly, with a potential collection of gas along the proximal descending colon.  Amount and distribution seems excessive and may indicate some form of gas leak or valve mechanism. Tracheobronchial injury with pneumomediastinum tracking down into the retroperitoneum is a differential diagnostic consideration.  I would doubt that the colon is the origin although there is a collection of gas adjacent to the colon. It might be prudent to obtain CT scan of the chest to further assess the degree of pneumomediastinum, although the pneumomediastinum is not readily visible on the radiographs. 2.  Abnormal gastric antral thickening, suspicious for antritis. 3.  Renal  cysts. 4.  Atherosclerosis. 5.  Air-fluid levels in the colon and small bowel, probably postoperative ileus, correlate with return of bowel function after surgery.  Diarrheal process is a differential diagnostic consideration.   Original Report Authenticated By: Gaylyn Rong, M.D.   Dg Abd Acute W/chest  03/15/2013   *RADIOLOGY REPORT*  Clinical Data: Right lower abdominal pain.  Weakness.  ACUTE ABDOMEN SERIES (ABDOMEN 2 VIEW & CHEST 1 VIEW)  Comparison: 10/31/2012  Findings: Cardiac and mediastinal contours appear normal.  The lungs appear clear.  No pleural effusion is identified.  No free peroneal gas observed. Suspected oral contrast medium in the stomach.  Borderline dilated loop of small bowel in the left abdomen.  Scattered air-fluid levels in small bowel loops in the right abdomen.  Relative paucity of gas and stool in the colon.  IMPRESSION:  1.  Abnormal but nonspecific bowel gas pattern, with borderline dilated loop of small bowel in the left abdomen and scattered air- fluid levels in the right abdomen.  This could be a manifestation of ileus, enteritis, or conceivably even early obstruction.   Original Report Authenticated By: Gaylyn Rong, M.D.    Anti-infectives: Anti-infectives   None      Assessment/Plan: S/P lap BIH repair Post-op pain - exam now benign, CT likely represents post-op changes Advanced diet Voiding trial VTE - SQ hep [facility] for D/C 5/19 if voids and pain controlled on orals  LOS: 2 days    Bashar Milam E 03/16/2013

## 2013-03-17 MED ORDER — DOCUSATE SODIUM 100 MG PO CAPS: 100.0000 mg | ORAL_CAPSULE | Freq: Two times a day (BID) | ORAL | Status: DC

## 2013-03-17 MED ORDER — OXYCODONE-ACETAMINOPHEN 5-325 MG PO TABS: 0.5000 | ORAL_TABLET | ORAL | Status: DC | PRN

## 2013-03-17 NOTE — Discharge Summary (Signed)
Physician Discharge Summary  Patient ID: Omar Sanders MRN: [id number] DOB/AGE: [birth year] 72 y.o.  Admit date: 03/14/2013 Discharge date: 03/17/2013  Admission Diagnoses: s/p bilateral hernia repair, abd pain  Discharge Diagnoses:  Active Problems:   * No active hospital problems. *   Discharged Condition: good  Hospital Course: Pt was admitted 2/2/ to abd pain and some abd distention.  He was started on IV pain meds as well and PO pain meds.  He was given a regular diet and did well tolerating that.  He was having good bowel function and bladder function.  He was ambulating on his own without any issues.  He was deemed stable for DC and DC'd home.  Consults: None  Significant Diagnostic Studies: Labs WNL  Treatments: pain medication  Discharge Exam: Blood pressure 98/55, pulse 59, temperature 98.1 F (36.7 C), temperature source Oral, resp. rate 16, height 5\' 7"  (1.702 m), weight 146 lb (66.225 kg), SpO2 97.00%. General appearance: alert and cooperative GI: soft, non-tender; bowel sounds normal; no masses,  no organomegaly, wounds c/d/i  Disposition: 01-Home or Self Care   Future Appointments Provider Department Dept Phone   04/03/2013 11:40 AM Axel Filler, MD [facility], [place] [phone]       Medication List    STOP taking these medications       oxycodone-acetaminophen 2.5-325 MG per tablet  Commonly known as:  PERCOCET      TAKE these medications       acetaminophen-codeine 300-30 MG per tablet  Commonly known as:  TYLENOL #3  Take 1 tablet by mouth 2 (two) times daily as needed for pain.     cloNIDine 0.1 MG tablet  Commonly known as:  CATAPRES  Take 0.1 mg by mouth as needed (take with blood pressure is elevated).     dicyclomine 20 MG tablet  Commonly known as:  BENTYL  Take 20 mg by mouth 3 (three) times daily.     docusate sodium 100 MG capsule  Commonly known as:  COLACE  Take 1 capsule (100 mg total) by mouth 2 (two) times daily.      gabapentin 300 MG capsule  Commonly known as:  NEURONTIN  Take 300 mg by mouth 2 (two) times daily.     lansoprazole 30 MG capsule  Commonly known as:  PREVACID  Take 30 mg by mouth daily.     lubiprostone 8 MCG capsule  Commonly known as:  AMITIZA  Take 8 mcg by mouth 2 (two) times daily with a meal.     oxyCODONE-acetaminophen 5-325 MG per tablet  Commonly known as:  PERCOCET/ROXICET  Take 0.5 tablets by mouth every 4 (four) hours as needed.     QUEtiapine 50 MG tablet  Commonly known as:  SEROQUEL  Take 50 mg by mouth at bedtime.     traZODone 50 MG tablet  Commonly known as:  DESYREL  Take 50 mg by mouth at bedtime.     triazolam 0.25 MG tablet  Commonly known as:  HALCION  Take 0.25 mg by mouth at bedtime.           Follow-up Information   Follow up with Lajean Saver, MD. Schedule an appointment as soon as possible for a visit in 2 weeks. (as previously scheduled)    Contact information:   [address] [place] [address] [phone]       Signed: Marigene Ehlers., Jed Limerick 03/17/2013, 8:35 AM

## 2013-03-17 NOTE — Progress Notes (Signed)
  Subjective: Pt doing well.  Tol PO.  Had BM yesterday.  Ambulating well.   Objective: Vital signs in last 24 hours: Temp:  [97.6 F (36.4 C)-98.1 F (36.7 C)] 98.1 F (36.7 C) (05/19 0525) Pulse Rate:  [59-77] 59 (05/19 0525) Resp:  [16-18] 16 (05/19 0525) BP: (98-145)/(55-83) 98/55 mmHg (05/19 0525) SpO2:  [97 %-98 %] 97 % (05/19 0525) Last BM Date: 03/16/13  Intake/Output from previous day: 05/18 0701 - 05/19 0700 In: 940 [P.O.:840; I.V.:100] Out: 1450 [Urine:1450] Intake/Output this shift:    General appearance: alert and cooperative GI: soft, non-tender; bowel sounds normal; no masses,  no organomegaly  Lab Results:   Recent Labs  03/14/13 1717 03/15/13 0445  WBC 6.7 5.7  HGB 11.6* 10.1*  HCT 34.1* 30.8*  PLT 168 171   BMET  Recent Labs  03/14/13 1717 03/15/13 0445  NA 138 138  K 3.9 3.5  CL 100 102  CO2 28 27  GLUCOSE 99 102*  BUN 14 15  CREATININE 1.09 1.07  CALCIUM 9.2 8.5   PT/INR No results found for this basename: LABPROT, INR,  in the last 72 hours ABG No results found for this basename: PHART, PCO2, PO2, HCO3,  in the last 72 hours  Studies/Results: No results found.  Anti-infectives: Anti-infectives   None      Assessment/Plan: s/p * No surgery found * home today  LOS: 3 days    Marigene Ehlers., [facility] 03/17/2013

## 2013-03-17 NOTE — Progress Notes (Addendum)
Attempted to discuss d/c info with pt. Pt wanting to have scripts for all his home meds. Already spoke with PA Barnetta Chapel this am about pt wanting scripts for meds he is on at home, advised that pt would have to get his primary MD to refill his meds. Pt would go home with script for new meds only.   All this was explained to pt, pt stated he cannot get them, states his doctor,"Dr Mayford Knife no longer takes medicare and medicaid", insist doctor here at hospital prescribe all his meds for him.   Spoke with unit  case manager, Herbert Seta, and she advised to tell pt to call the number on his medicare/medicaid card and ask for assistance in finding a PCP in this area where pt lives, and or pt could call [facility] at [phone] if he desires a pt in the [facility].   Due to some language barrier, pt's native language is [place], but does speak English fairly well and able to get all the above info from pt, arranged for interpreter to assist in getting a better understanding of pt's needs and PCP information.   Next RN Marchelle Folks taking over pt's care and waiting for interpreter to arrive around 1530 and will investigate MD and script needs, and give pt the above info to call medicare/medicare and or [facility] info pending the pt's eplaination of needs.

## 2013-03-17 NOTE — Discharge Summary (Signed)
Pt being d/ced; interpreter at bedside; pt going home with family members

## 2013-03-31 ENCOUNTER — Telehealth (INDEPENDENT_AMBULATORY_CARE_PROVIDER_SITE_OTHER): Payer: Self-pay | Admitting: General Surgery

## 2013-03-31 NOTE — Telephone Encounter (Signed)
Omar Sanders, friend of the family for this pt called for advice.  After speaking directly with the pt to obtain permission to discuss his medical condition with her and receiving permission, she explained his problem.  She stated he has pain, abdominal bloating and headache.  Pt states his last BM was Saturday and it was small.  He has been taking pain medication and resting a lot.  Discussed at length the need and how to get his bowels moving QD, including taking stool softeners (2 caps BID for now,) drinking up to 2 liters of fluids a day, walking twice a day for 30 minutes and beginning Miralax (2 capfuls in 12 oz water BID for now.)  Emphasized the need to do all of this to remedy his symptoms.  Once his bowels begin to move can decrease the Colace an Miralax.  Reminding them of first postop appt this week with Dr. Derrell Lolling and to update him at that time on his bowel progress.  Friend read back and understands all instructions.

## 2013-04-03 ENCOUNTER — Ambulatory Visit (INDEPENDENT_AMBULATORY_CARE_PROVIDER_SITE_OTHER): Payer: PRIVATE HEALTH INSURANCE | Admitting: General Surgery

## 2013-04-03 ENCOUNTER — Encounter (INDEPENDENT_AMBULATORY_CARE_PROVIDER_SITE_OTHER): Payer: Self-pay | Admitting: General Surgery

## 2013-04-03 VITALS — BP 98/60 | HR 54 | Temp 97.6°F | Resp 16 | Ht 67.0 in | Wt 148.8 lb

## 2013-04-03 DIAGNOSIS — Z8719 Personal history of other diseases of the digestive system: Secondary | ICD-10-CM

## 2013-04-03 DIAGNOSIS — Z9889 Other specified postprocedural states: Secondary | ICD-10-CM

## 2013-04-03 NOTE — Progress Notes (Signed)
Patient ID: Omar Sanders, male   DOB: 11/25/1940, 72 y.o.   MRN: [id number] The patient is a 72 year old male [place] speaking only status post lap inguinal hernia repair.  The patient has been having some issues with lower abdominal pain has continued on pain medication. He has been taking stool softeners on off. He states his abdominal pain is better with his stool softeners but when he goes off in his abdominal pain returns. The patient continues to ambulate regularly at home.  On exam: Wounds are clean dry and intact, there is no hernia on palpation.  Assessment and plan: 72 year old male status post left inguinal hernia repair The patient has issues with constipation. I discussed with the patient via a translator that he should transitioned to over-the-counter pain medication to help with his constipation. I also discussed with him that he should continue with a stool softener regularly to help with constipation. Via the interpreter the patient stated he understood and would continue with stool softeners.  The patient follow back up in 2 weeks to reassess the abdominal pain and hopefully his resolution of constipation.

## 2013-04-16 ENCOUNTER — Telehealth (INDEPENDENT_AMBULATORY_CARE_PROVIDER_SITE_OTHER): Payer: Self-pay | Admitting: General Surgery

## 2013-04-16 NOTE — Telephone Encounter (Signed)
Called to ask patient if he could be here at 8:45 for his appt tomorrow 6/19 and he stated that yes he could and I also verified that he would bring an interpreter with him...he stated that he would have someone with him and asked what could he do because he was in pain..I told him that he needed to take it easy and put some ice or ice packs on the area that was hurting and he stated that he did not have any and there was no way for him to get any..he asked me what he could do if the pain began to be too much and I told him that he would need to go to the ED...patient stated that would be too hard for him as well but he did agree to be here at 8:45 in the morning

## 2013-04-17 ENCOUNTER — Encounter (INDEPENDENT_AMBULATORY_CARE_PROVIDER_SITE_OTHER): Payer: PRIVATE HEALTH INSURANCE | Admitting: General Surgery

## 2013-04-17 ENCOUNTER — Ambulatory Visit (INDEPENDENT_AMBULATORY_CARE_PROVIDER_SITE_OTHER): Payer: PRIVATE HEALTH INSURANCE | Admitting: General Surgery

## 2013-04-17 ENCOUNTER — Encounter (INDEPENDENT_AMBULATORY_CARE_PROVIDER_SITE_OTHER): Payer: Self-pay | Admitting: General Surgery

## 2013-04-17 ENCOUNTER — Telehealth (INDEPENDENT_AMBULATORY_CARE_PROVIDER_SITE_OTHER): Payer: Self-pay

## 2013-04-17 VITALS — BP 112/74 | HR 60 | Temp 97.6°F | Resp 14 | Ht 67.0 in | Wt 144.4 lb

## 2013-04-17 DIAGNOSIS — Z9889 Other specified postprocedural states: Secondary | ICD-10-CM

## 2013-04-17 DIAGNOSIS — Z8719 Personal history of other diseases of the digestive system: Secondary | ICD-10-CM

## 2013-04-17 MED ORDER — OXYCODONE-ACETAMINOPHEN 5-325 MG PO TABS: 1.0000 | ORAL_TABLET | ORAL | Status: DC | PRN

## 2013-04-17 NOTE — Telephone Encounter (Signed)
Pt called wanting to speak with Dr Derrell Lolling or his assistant re: the medication he was given today. Per Lawson Fiscal pt advised they are in clinic and will return his call at end of clinic.

## 2013-04-17 NOTE — Progress Notes (Signed)
Patient ID: Omar Sanders, male   DOB: 08/29/1941, 72 y.o.   MRN: [id number] The patient is a 71 year old male status post bilateral inguinal hernia repair with mesh. The patient's history and interview was obtained using an interpreter.The patient states is having some bilateral abdominal pain. He does not have so much pain in his inguinal area.  The patient states that the Percocet does help him. He is taking Colace as stool softener to help with constipation.  On exam:  His wounds are clean dry and intact.There is no hernia on palpation.  Assessment and plan: 72 year old male status post bilateral inguinal hernia Repair with Mesh. I discussed with the patient that his pain will improve over time. 1. We discussed would like to recheck him for another 2 months. 2. I will give the patient prescription for Percocet.  3. The patient continues with pain Will have patient follow back up in

## 2013-04-17 NOTE — Telephone Encounter (Signed)
Returned patient's call to let him know that he needed to go and pick up his oxycodone at the pharmacy and take 1 tablet q 4 hrs prn pain per RX orders..patient stated that he did not have a ride today that it would be tomorrow before he could go and get it and I told him that would be fine..patient stated that he acknowledged

## 2013-04-17 NOTE — Addendum Note (Signed)
Addended by: June Leap on: 04/17/2013 09:36 AM   Modules accepted: Orders

## 2013-04-18 LAB — CBC AND DIFFERENTIAL
HCT: 36 % — AB (ref 41–53)
Hemoglobin: 11.5 g/dL — AB (ref 13.5–17.5)
Neutrophils Absolute: 3 /uL
Platelets: 182 10*3/uL (ref 150–399)
WBC: 4.7 10^3/mL

## 2013-04-18 LAB — FERRITIN
Ferritin: 330
Vit D, 25-Hydroxy: 28

## 2013-04-18 LAB — POCT ERYTHROCYTE SEDIMENTATION RATE, NON-AUTOMATED: Sed Rate: 42 mm

## 2013-05-04 ENCOUNTER — Encounter (HOSPITAL_COMMUNITY): Payer: Self-pay | Admitting: *Deleted

## 2013-05-04 DIAGNOSIS — F411 Generalized anxiety disorder: Secondary | ICD-10-CM | POA: Insufficient documentation

## 2013-05-04 DIAGNOSIS — I10 Essential (primary) hypertension: Secondary | ICD-10-CM | POA: Insufficient documentation

## 2013-05-04 DIAGNOSIS — K219 Gastro-esophageal reflux disease without esophagitis: Secondary | ICD-10-CM | POA: Insufficient documentation

## 2013-05-04 DIAGNOSIS — Z8669 Personal history of other diseases of the nervous system and sense organs: Secondary | ICD-10-CM | POA: Insufficient documentation

## 2013-05-04 DIAGNOSIS — R109 Unspecified abdominal pain: Secondary | ICD-10-CM | POA: Insufficient documentation

## 2013-05-04 DIAGNOSIS — G4733 Obstructive sleep apnea (adult) (pediatric): Secondary | ICD-10-CM | POA: Insufficient documentation

## 2013-05-04 DIAGNOSIS — Z8679 Personal history of other diseases of the circulatory system: Secondary | ICD-10-CM | POA: Insufficient documentation

## 2013-05-04 DIAGNOSIS — Z79899 Other long term (current) drug therapy: Secondary | ICD-10-CM | POA: Insufficient documentation

## 2013-05-04 DIAGNOSIS — Z87448 Personal history of other diseases of urinary system: Secondary | ICD-10-CM | POA: Insufficient documentation

## 2013-05-04 LAB — COMPREHENSIVE METABOLIC PANEL
ALT: 18 U/L (ref 0–53)
AST: 20 U/L (ref 0–37)
Albumin: 3.8 g/dL (ref 3.5–5.2)
Alkaline Phosphatase: 50 U/L (ref 39–117)
BUN: 17 mg/dL (ref 6–23)
CO2: 26 mEq/L (ref 19–32)
Calcium: 9 mg/dL (ref 8.4–10.5)
Chloride: 101 mEq/L (ref 96–112)
Creatinine, Ser: 1.19 mg/dL (ref 0.50–1.35)
GFR calc Af Amer: 68 mL/min — ABNORMAL LOW (ref >90)
GFR calc non Af Amer: 59 mL/min — ABNORMAL LOW (ref >90)
Glucose, Bld: 101 mg/dL — ABNORMAL HIGH (ref 70–99)
Potassium: 4.1 mEq/L (ref 3.5–5.1)
Sodium: 134 mEq/L — ABNORMAL LOW (ref 135–145)
Total Bilirubin: 0.3 mg/dL (ref 0.3–1.2)
Total Protein: 7.4 g/dL (ref 6.0–8.3)

## 2013-05-04 LAB — CBC WITH DIFFERENTIAL/PLATELET
Basophils Absolute: 0 10*3/uL (ref 0.0–0.1)
Basophils Relative: 0 % (ref 0–1)
Eosinophils Absolute: 0.1 10*3/uL (ref 0.0–0.7)
Eosinophils Relative: 2 % (ref 0–5)
HCT: 30.4 % — ABNORMAL LOW (ref 39.0–52.0)
Hemoglobin: 10.4 g/dL — ABNORMAL LOW (ref 13.0–17.0)
Lymphocytes Relative: 31 % (ref 12–46)
Lymphs Abs: 1.5 10*3/uL (ref 0.7–4.0)
MCH: 26.8 pg (ref 26.0–34.0)
MCHC: 34.2 g/dL (ref 30.0–36.0)
MCV: 78.4 fL (ref 78.0–100.0)
Monocytes Absolute: 0.6 10*3/uL (ref 0.1–1.0)
Monocytes Relative: 13 % — ABNORMAL HIGH (ref 3–12)
Neutro Abs: 2.6 10*3/uL (ref 1.7–7.7)
Neutrophils Relative %: 54 % (ref 43–77)
Platelets: 207 10*3/uL (ref 150–400)
RBC: 3.88 MIL/uL — ABNORMAL LOW (ref 4.22–5.81)
RDW: 14.2 % (ref 11.5–15.5)
WBC: 4.8 10*3/uL (ref 4.0–10.5)

## 2013-05-04 LAB — URINALYSIS, ROUTINE W REFLEX MICROSCOPIC
Bilirubin Urine: NEGATIVE
Glucose, UA: NEGATIVE mg/dL
Hgb urine dipstick: NEGATIVE
Ketones, ur: NEGATIVE mg/dL
Leukocytes, UA: NEGATIVE
Nitrite: NEGATIVE
Protein, ur: NEGATIVE mg/dL
Specific Gravity, Urine: 1.007 (ref 1.005–1.030)
Urobilinogen, UA: 0.2 mg/dL (ref 0.0–1.0)
pH: 6 (ref 5.0–8.0)

## 2013-05-04 NOTE — ED Notes (Signed)
The pt  Arrived by gems  The pt speaks no english points to his head and his abd.  His son who is on the way here speaks english

## 2013-05-05 ENCOUNTER — Emergency Department (HOSPITAL_COMMUNITY): Payer: PRIVATE HEALTH INSURANCE

## 2013-05-05 ENCOUNTER — Emergency Department (HOSPITAL_COMMUNITY)
Admission: EM | Admit: 2013-05-05 | Discharge: 2013-05-05 | Disposition: A | Discharge status: Home / Self Care | Payer: PRIVATE HEALTH INSURANCE | Attending: Emergency Medicine | Admitting: Emergency Medicine

## 2013-05-05 DIAGNOSIS — R109 Unspecified abdominal pain: Secondary | ICD-10-CM

## 2013-05-05 MED ORDER — OXYCODONE-ACETAMINOPHEN 5-325 MG PO TABS: 1.0000 | ORAL_TABLET | ORAL | Status: DC | PRN

## 2013-05-05 MED ORDER — TRAMADOL HCL 50 MG PO TABS
50.0000 mg | ORAL_TABLET | Freq: Once | ORAL | Status: AC
  Administered 2013-05-05: 50 mg via ORAL
  Filled 2013-05-05: qty 1

## 2013-05-05 NOTE — ED Provider Notes (Signed)
History    CSN: [id number] Arrival date & time 05/04/13  2053  First MD Initiated Contact with Patient 05/05/13 0012     Chief Complaint  Patient presents with  . Abdominal Pain   (Consider location/radiation/quality/duration/timing/severity/associated sxs/prior Treatment) HPI Comments: 72 year old male who had a laparoscopic bilateral inguinal hernia repair in May of 2014 presents with recurrent abdominal pain. He states that he hurts all over including a headache, bilateral leg pain, back pain and abdominal pain. He denies nausea or vomiting, denies coughing, denies fever, denies dysuria, denies blood in the stools. Nothing seems to make this better, worse with palpation of the abdomen, not associated with decreased appetite. He denies feeling distended. According to the medical record the patient had a postop ileus and required a short observational admission after his surgery.  Patient is a 72 y.o. male presenting with abdominal pain. The history is provided by the patient, a relative and medical records.  Abdominal Pain Associated symptoms include abdominal pain.   Past Medical History  Diagnosis Date  . GERD (gastroesophageal reflux disease)   . Hypertension   . Headache(784.0)   . Neuromuscular disorder     periferal neuropathy per patient  . OSA (obstructive sleep apnea) 11/2009    sleep study with mild OSA, pt unwilling to use CPAP  . Anxiety   . Incontinent of urine     Hx:of  . Hyperlipidemia     Hx: of   Past Surgical History  Procedure Laterality Date  . Cardiac catheterization  05/2019    to   . Cholecystectomy    . Colonoscopy  2012    Dr Loreta Ave  . Insertion of mesh Bilateral 03/11/2013    Procedure: INSERTION OF MESH;  Surgeon: Axel Filler, MD;  Location: [facility] OR;  Service: General;  Laterality: Bilateral;  . Inguinal hernia repair Bilateral 03/11/2013    Procedure: LAPAROSCOPIC BILATERAL INGUINAL HERNIA REPAIR;  Surgeon: Axel Filler, MD;  Location: [facility] OR;   Service: General;  Laterality: Bilateral;   No family history on file. History  Substance Use Topics  . Smoking status: Never Smoker   . Smokeless tobacco: Never Used  . Alcohol Use: No    Review of Systems  Gastrointestinal: Positive for abdominal pain.  All other systems reviewed and are negative.    Allergies  Aspirin and Vitamin c  Home Medications   Current Outpatient Rx  Name  Route  Sig  Dispense  Refill  . acetaminophen-codeine (TYLENOL #3) 300-30 MG per tablet   Oral   Take 1 tablet by mouth 2 (two) times daily as needed for pain.         . cloNIDine (CATAPRES) 0.1 MG tablet   Oral   Take 0.1 mg by mouth as needed (take with blood pressure is elevated).         Marland Kitchen docusate sodium (COLACE) 100 MG capsule   Oral   Take 1 capsule (100 mg total) by mouth 2 (two) times daily.   30 capsule   0   . gabapentin (NEURONTIN) 300 MG capsule   Oral   Take 300 mg by mouth 2 (two) times daily.          . lansoprazole (PREVACID) 30 MG capsule   Oral   Take 30 mg by mouth daily.         Marland Kitchen lisinopril (PRINIVIL,ZESTRIL) 20 MG tablet               . lubiprostone (AMITIZA) 8 MCG  capsule   Oral   Take 8 mcg by mouth 2 (two) times daily with a meal.         . oxyCODONE-acetaminophen (PERCOCET) 5-325 MG per tablet   Oral   Take 1 tablet by mouth every 4 (four) hours as needed for pain.   20 tablet   0   . oxyCODONE-acetaminophen (ROXICET) 5-325 MG per tablet   Oral   Take 1 tablet by mouth every 4 (four) hours as needed for pain.   30 tablet   0   . QUEtiapine (SEROQUEL) 50 MG tablet   Oral   Take 50 mg by mouth at bedtime.         . triazolam (HALCION) 0.25 MG tablet   Oral   Take 0.25 mg by mouth at bedtime.           BP 133/75  Pulse 66  Temp(Src) 98.1 F (36.7 C) (Oral)  Resp 18  SpO2 99% Physical Exam  Nursing note and vitals reviewed. Constitutional: He appears well-developed and well-nourished. No distress.  HENT:  Head:  Normocephalic and atraumatic.  Mouth/Throat: Oropharynx is clear and moist. No oropharyngeal exudate.  Eyes: Conjunctivae and EOM are normal. Pupils are equal, round, and reactive to light. Right eye exhibits no discharge. Left eye exhibits no discharge. No scleral icterus.  Neck: Normal range of motion. Neck supple. No JVD present. No thyromegaly present.  Cardiovascular: Normal rate, regular rhythm, normal heart sounds and intact distal pulses.  Exam reveals no gallop and no friction rub.   No murmur heard. Pulmonary/Chest: Breath sounds normal. No respiratory distress. He has no wheezes. He has no rales.  Mild tachypnea  Abdominal: Soft. Bowel sounds are normal. He exhibits no distension and no mass. There is tenderness ( Bilateral lower abdominal tenderness to palpation, no guarding, no peritoneal signs, no cough pain).  Musculoskeletal: Normal range of motion. He exhibits no edema and no tenderness.  Lymphadenopathy:    He has no cervical adenopathy.  Neurological: He is alert. Coordination normal.  Skin: Skin is warm and dry. No rash noted. No erythema.  Psychiatric: He has a normal mood and affect. His behavior is normal.    ED Course  Procedures (including critical care time) Labs Reviewed  CBC WITH DIFFERENTIAL - Abnormal; Notable for the following:    RBC 3.88 (*)    Hemoglobin 10.4 (*)    HCT 30.4 (*)    Monocytes Relative 13 (*)    All other components within normal limits  COMPREHENSIVE METABOLIC PANEL - Abnormal; Notable for the following:    Sodium 134 (*)    Glucose, Bld 101 (*)    GFR calc non Af Amer 59 (*)    GFR calc Af Amer 68 (*)    All other components within normal limits  URINALYSIS, ROUTINE W REFLEX MICROSCOPIC - Abnormal; Notable for the following:    Color, Urine STRAW (*)    APPearance CLOUDY (*)    All other components within normal limits   Dg Abd Acute W/chest  05/05/2013   *RADIOLOGY REPORT*  Clinical Data: Shortness of breath.  Abdominal pain.   Recent surgery for inguinal hernia.  ACUTE ABDOMEN SERIES (ABDOMEN 2 VIEW & CHEST 1 VIEW)  Comparison: 03/14/2013.  Findings: The heart size and pulmonary vascularity are normal. The lungs appear clear and expanded without focal air space disease or consolidation. No blunting of the costophrenic angles.  No pneumothorax.  Mediastinal contours appear intact.  Calcification in the aorta.  No significant change since previous study.  Scattered gas and stool in the colon.  No small or large bowel distension.  No free intra-abdominal air.  No abnormal air fluid levels.  Surgical clips in the right upper quadrant and pelvis. Mild degenerative changes in the lumbar spine and hips.  Resolution of small bowel gas since previous study.  IMPRESSION: No evidence of active pulmonary disease.  Nonobstructive bowel gas pattern.   Original Report Authenticated By: Burman Nieves, M.D.   1. Abdominal pain     MDM  The patient has normal laboratory workup including a clean urinalysis, normal blood counts other than a mild anemia and a normal contour has a metabolic panel. We'll add a lipase, chest x-ray and acute abdominal series to evaluate for possible postop bowel obstruction though less likely, possible pneumonia as the patient does appear to have chills and a mild tachypnea.  Labs appear normal, vital signs are normal and on repeat exam at 2:30 AM the patient has a soft abdomen with no guarding. He now states that his whole body hurts, he denies any focal pain, he has a normal x-ray. I have discussed this with the patient over and over and finally he states that #1 he is out of medication, #2 his family wants him to go to a nursing home so they don't have to take care of him anymore. He admits that he is able eat, drink and walk, I have informed him that he is not a candidate for nursing home at this time, resources have been given, the patient appears stable for discharge  Meds given in ED:  Medications  traMADol  (ULTRAM) tablet 50 mg (50 mg Oral Given 05/05/13 0209)    New Prescriptions   OXYCODONE-ACETAMINOPHEN (PERCOCET) 5-325 MG PER TABLET    Take 1 tablet by mouth every 4 (four) hours as needed for pain.      Vida Roller, MD 05/05/13 [phone]

## 2013-06-19 ENCOUNTER — Encounter (HOSPITAL_COMMUNITY): Payer: Self-pay

## 2013-06-19 ENCOUNTER — Emergency Department (HOSPITAL_COMMUNITY): Payer: PRIVATE HEALTH INSURANCE

## 2013-06-19 ENCOUNTER — Emergency Department (HOSPITAL_COMMUNITY)
Admission: EM | Admit: 2013-06-19 | Discharge: 2013-06-20 | Disposition: A | Discharge status: Skilled Nursing Facility | Payer: PRIVATE HEALTH INSURANCE | Attending: Emergency Medicine | Admitting: Emergency Medicine

## 2013-06-19 DIAGNOSIS — K219 Gastro-esophageal reflux disease without esophagitis: Secondary | ICD-10-CM | POA: Insufficient documentation

## 2013-06-19 DIAGNOSIS — G8929 Other chronic pain: Secondary | ICD-10-CM

## 2013-06-19 DIAGNOSIS — R0789 Other chest pain: Secondary | ICD-10-CM | POA: Insufficient documentation

## 2013-06-19 DIAGNOSIS — F411 Generalized anxiety disorder: Secondary | ICD-10-CM | POA: Insufficient documentation

## 2013-06-19 DIAGNOSIS — R110 Nausea: Secondary | ICD-10-CM | POA: Insufficient documentation

## 2013-06-19 DIAGNOSIS — G709 Myoneural disorder, unspecified: Secondary | ICD-10-CM | POA: Insufficient documentation

## 2013-06-19 DIAGNOSIS — Z79899 Other long term (current) drug therapy: Secondary | ICD-10-CM | POA: Insufficient documentation

## 2013-06-19 DIAGNOSIS — E785 Hyperlipidemia, unspecified: Secondary | ICD-10-CM | POA: Insufficient documentation

## 2013-06-19 DIAGNOSIS — I10 Essential (primary) hypertension: Secondary | ICD-10-CM | POA: Insufficient documentation

## 2013-06-19 MED ORDER — HYDROMORPHONE HCL PF 1 MG/ML IJ SOLN
0.5000 mg | Freq: Once | INTRAMUSCULAR | Status: AC
  Administered 2013-06-20: 0.5 mg via INTRAVENOUS
  Filled 2013-06-19: qty 1

## 2013-06-19 NOTE — ED Notes (Signed)
Pt states that he has chronic back, leg, chest wall pain. He states that he is constipated and painful urination.

## 2013-06-19 NOTE — ED Notes (Signed)
Pt complains of pain all over for today, he has several pain medications, and facility states that he complains of this frequently.

## 2013-06-20 ENCOUNTER — Encounter (HOSPITAL_COMMUNITY): Payer: Self-pay

## 2013-06-20 LAB — URINALYSIS, ROUTINE W REFLEX MICROSCOPIC
Bilirubin Urine: NEGATIVE
Glucose, UA: NEGATIVE mg/dL
Hgb urine dipstick: NEGATIVE
Ketones, ur: NEGATIVE mg/dL
Leukocytes, UA: NEGATIVE
Nitrite: NEGATIVE
Protein, ur: NEGATIVE mg/dL
Specific Gravity, Urine: 1.009 (ref 1.005–1.030)
Urobilinogen, UA: 0.2 mg/dL (ref 0.0–1.0)
pH: 6 (ref 5.0–8.0)

## 2013-06-20 LAB — CBC WITH DIFFERENTIAL/PLATELET
Basophils Absolute: 0 10*3/uL (ref 0.0–0.1)
Basophils Relative: 0 % (ref 0–1)
Eosinophils Absolute: 0.1 10*3/uL (ref 0.0–0.7)
Eosinophils Relative: 1 % (ref 0–5)
HCT: 31.7 % — ABNORMAL LOW (ref 39.0–52.0)
Hemoglobin: 10.6 g/dL — ABNORMAL LOW (ref 13.0–17.0)
Lymphocytes Relative: 32 % (ref 12–46)
Lymphs Abs: 1.5 10*3/uL (ref 0.7–4.0)
MCH: 26.8 pg (ref 26.0–34.0)
MCHC: 33.4 g/dL (ref 30.0–36.0)
MCV: 80.3 fL (ref 78.0–100.0)
Monocytes Absolute: 0.5 10*3/uL (ref 0.1–1.0)
Monocytes Relative: 11 % (ref 3–12)
Neutro Abs: 2.7 10*3/uL (ref 1.7–7.7)
Neutrophils Relative %: 56 % (ref 43–77)
Platelets: 229 10*3/uL (ref 150–400)
RBC: 3.95 MIL/uL — ABNORMAL LOW (ref 4.22–5.81)
RDW: 13.4 % (ref 11.5–15.5)
WBC: 4.8 10*3/uL (ref 4.0–10.5)

## 2013-06-20 LAB — COMPREHENSIVE METABOLIC PANEL
ALT: 21 U/L (ref 0–53)
AST: 22 U/L (ref 0–37)
Albumin: 4 g/dL (ref 3.5–5.2)
Alkaline Phosphatase: 55 U/L (ref 39–117)
BUN: 14 mg/dL (ref 6–23)
CO2: 27 mEq/L (ref 19–32)
Calcium: 9.4 mg/dL (ref 8.4–10.5)
Chloride: 93 mEq/L — ABNORMAL LOW (ref 96–112)
Creatinine, Ser: 1.05 mg/dL (ref 0.50–1.35)
GFR calc Af Amer: 79 mL/min — ABNORMAL LOW (ref >90)
GFR calc non Af Amer: 68 mL/min — ABNORMAL LOW (ref >90)
Glucose, Bld: 115 mg/dL — ABNORMAL HIGH (ref 70–99)
Potassium: 3.9 mEq/L (ref 3.5–5.1)
Sodium: 128 mEq/L — ABNORMAL LOW (ref 135–145)
Total Bilirubin: 0.4 mg/dL (ref 0.3–1.2)
Total Protein: 7.6 g/dL (ref 6.0–8.3)

## 2013-06-20 LAB — CG4 I-STAT (LACTIC ACID): Lactic Acid, Venous: 1.37 mmol/L (ref 0.5–2.2)

## 2013-06-20 LAB — TROPONIN I: Troponin I: <0.3 ng/mL (ref <0.30)

## 2013-06-20 LAB — D-DIMER, QUANTITATIVE (NOT AT ARMC): D-Dimer, Quant: 0.4 ug/mL-FEU (ref 0.00–0.48)

## 2013-06-20 MED ORDER — DICYCLOMINE HCL 20 MG PO TABS: 20.0000 mg | ORAL_TABLET | Freq: Four times a day (QID) | ORAL | Status: DC | PRN

## 2013-06-20 MED ORDER — OXYCODONE-ACETAMINOPHEN 5-325 MG PO TABS: 2.0000 | ORAL_TABLET | ORAL | Status: AC | PRN

## 2013-06-20 MED ORDER — GI COCKTAIL ~~LOC~~
30.0000 mL | Freq: Once | ORAL | Status: AC
  Administered 2013-06-20: 30 mL via ORAL
  Filled 2013-06-20: qty 30

## 2013-06-20 MED ORDER — HYDROMORPHONE HCL PF 1 MG/ML IJ SOLN
0.5000 mg | Freq: Once | INTRAMUSCULAR | Status: AC
  Administered 2013-06-20: 0.5 mg via INTRAVENOUS
  Filled 2013-06-20: qty 1

## 2013-06-20 MED ORDER — LORAZEPAM 2 MG/ML IJ SOLN
1.0000 mg | Freq: Once | INTRAMUSCULAR | Status: AC
  Administered 2013-06-20: 1 mg via INTRAVENOUS
  Filled 2013-06-20: qty 1

## 2013-06-20 NOTE — ED Provider Notes (Signed)
CSN: [id number]     Arrival date & time 06/19/13  2212 History     First MD Initiated Contact with Patient 06/19/13 2340     Chief Complaint  Patient presents with  . Generalized Body Aches   (Consider location/radiation/quality/duration/timing/severity/associated sxs/prior Treatment) HPI 72 yo male presents from nursing home via EMS with complaint of pain.  Pt speaks [place], but speaks some english.  He reports tonight after dinner at 630 he had onset of left sided abdominal and lower chest pain, along with headache, left neck, shoulder pain, bilateral lower leg pain.  Pain is severe, worst ever.  He reports the pain is new.  No cough, fevers, vomiting, constipation.  No sob. He reports nausea.  Pain is sharp, burning. Phone translator used.    Past Medical History  Diagnosis Date  . GERD (gastroesophageal reflux disease)   . Hypertension   . Headache(784.0)   . Neuromuscular disorder     periferal neuropathy per patient  . OSA (obstructive sleep apnea) 11/2009    sleep study with mild OSA, pt unwilling to use CPAP  . Anxiety   . Incontinent of urine     Hx:of  . Hyperlipidemia     Hx: of   Past Surgical History  Procedure Laterality Date  . Cardiac catheterization  05/2019    to   . Cholecystectomy    . Colonoscopy  2012    Dr Loreta Ave  . Insertion of mesh Bilateral 03/11/2013    Procedure: INSERTION OF MESH;  Surgeon: Axel Filler, MD;  Location: [facility] OR;  Service: General;  Laterality: Bilateral;  . Inguinal hernia repair Bilateral 03/11/2013    Procedure: LAPAROSCOPIC BILATERAL INGUINAL HERNIA REPAIR;  Surgeon: Axel Filler, MD;  Location: [facility] OR;  Service: General;  Laterality: Bilateral;   History reviewed. No pertinent family history. History  Substance Use Topics  . Smoking status: Never Smoker   . Smokeless tobacco: Never Used  . Alcohol Use: No    Review of Systems  All other systems reviewed and are negative.  other than listed in hpi  Allergies   Aspirin and Vitamin c  Home Medications   Current Outpatient Rx  Name  Route  Sig  Dispense  Refill  . cloNIDine (CATAPRES) 0.1 MG tablet   Oral   Take 0.1 mg by mouth as needed (take when blood pressure is elevated).          . diphenoxylate-atropine (LOMOTIL) 2.5-0.025 MG per tablet   Oral   Take 1 tablet by mouth 4 (four) times daily as needed for diarrhea or loose stools.         . fentaNYL (DURAGESIC - DOSED MCG/HR) 25 MCG/HR patch   Transdermal   Place 1 patch onto the skin every 3 (three) days.         . Fish Oil-Cholecalciferol (OMEGA-3 FISH OIL/VITAMIN D3) 1000-1000 MG-UNIT CAPS   Oral   Take 1 capsule by mouth daily.         Marland Kitchen lisinopril (PRINIVIL,ZESTRIL) 20 MG tablet   Oral   Take 20 mg by mouth daily.          Marland Kitchen loratadine (CLARITIN) 10 MG tablet   Oral   Take 10 mg by mouth daily as needed for allergies.         Marland Kitchen lubiprostone (AMITIZA) 24 MCG capsule   Oral   Take 24 mcg by mouth daily.         . pantoprazole (PROTONIX) 40 MG tablet  Oral   Take 40 mg by mouth daily.         . QUEtiapine (SEROQUEL) 50 MG tablet   Oral   Take 50 mg by mouth at bedtime.         . Tetrahyd-Glyc-Hypro-PEG-ZnSulf (VISINE TOTALITY MULTI-SYMPTOM) 0.05 % SOLN   Ophthalmic   Apply 1 drop to eye as needed (for itchy eyes).         Marland Kitchen dicyclomine (BENTYL) 20 MG tablet   Oral   Take 1 tablet (20 mg total) by mouth every 6 (six) hours as needed (for abdominal cramping).   20 tablet   0   . oxyCODONE-acetaminophen (ROXICET) 5-325 MG per tablet   Oral   Take 2 tablets by mouth every 4 (four) hours as needed for pain.   30 tablet   0    BP 106/70  Pulse 76  Temp(Src) 98 F (36.7 C) (Oral)  Resp 17  SpO2 99% Physical Exam  Nursing note and vitals reviewed. Constitutional: He is oriented to person, place, and time. He appears well-developed and well-nourished. He appears distressed.  HENT:  Head: Normocephalic and atraumatic.  Right Ear:  External ear normal.  Left Ear: External ear normal.  Nose: Nose normal.  Mouth/Throat: Oropharynx is clear and moist.  Eyes: Conjunctivae and EOM are normal. Pupils are equal, round, and reactive to light.  Neck: Normal range of motion. Neck supple. No JVD present. No tracheal deviation present. No thyromegaly present.  Cardiovascular: Normal rate, regular rhythm, normal heart sounds and intact distal pulses.  Exam reveals no gallop and no friction rub.   No murmur heard. Pulmonary/Chest: Effort normal and breath sounds normal. No stridor. No respiratory distress. He has no wheezes. He has no rales. He exhibits tenderness.  Abdominal: Soft. Bowel sounds are normal. He exhibits no distension and no mass. There is tenderness. There is no rebound and no guarding.  Musculoskeletal: Normal range of motion. He exhibits tenderness. He exhibits no edema.  Lymphadenopathy:    He has no cervical adenopathy.  Neurological: He is alert and oriented to person, place, and time. He exhibits normal muscle tone. Coordination normal.  Skin: Skin is warm and dry. No rash noted. No erythema. No pallor.  Psychiatric: He has a normal mood and affect. His behavior is normal. Judgment and thought content normal.    ED Course   Procedures (including critical care time)  Labs Reviewed  CBC WITH DIFFERENTIAL - Abnormal; Notable for the following:    RBC 3.95 (*)    Hemoglobin 10.6 (*)    HCT 31.7 (*)    All other components within normal limits  COMPREHENSIVE METABOLIC PANEL - Abnormal; Notable for the following:    Sodium 128 (*)    Chloride 93 (*)    Glucose, Bld 115 (*)    GFR calc non Af Amer 68 (*)    GFR calc Af Amer 79 (*)    All other components within normal limits  URINALYSIS, ROUTINE W REFLEX MICROSCOPIC  D-DIMER, QUANTITATIVE  TROPONIN I  CG4 I-STAT (LACTIC ACID)   Dg Abd Acute W/chest  06/20/2013   *RADIOLOGY REPORT*  Clinical Data: Left-sided chest pain and left-sided abdominal pain.  Painful urination.  Constipation.  Shortness of breath.  ACUTE ABDOMEN SERIES (ABDOMEN 2 VIEW & CHEST 1 VIEW)  Comparison: 05/05/2013  Findings: Heart and lungs appear normal.  No free air or free fluid in the abdomen.  There is air scattered throughout the bowel. There is a  single loop of slightly distended small bowel in the right mid abdomen.  Bowel gas pattern is otherwise normal. Surgical clips from previous cholecystectomy.  Multiple surgical clips in the pelvis.  IMPRESSION: Single slightly distended small bowel loop in the right mid abdomen, nonspecific.   Original Report Authenticated By: Francene Boyers, M.D.    Date: 06/20/2013  Rate: 89  Rhythm: normal sinus rhythm  QRS Axis: normal  Intervals: PR prolonged  ST/T Wave abnormalities: normal  Conduction Disutrbances:first-degree A-V block   Narrative Interpretation:   Old EKG Reviewed: unchanged    1. Chronic chest pain   2. Chronic pain     MDM  72 yo male with what initially sounded like acute pain.  Nothing we have tried has seemed to help his pain.  His workup showed mild hyponatremia, anemia, but no specific source for his pain.  Records reviewed, and patient had similar presentation in January with chest pain admission, and no findings for his pain found.  No emergent causes identified.  Will d/c back to nursing home to f/u with pcm.  Olivia Mackie, MD 06/20/13 [phone]

## 2014-06-17 ENCOUNTER — Encounter: Payer: Self-pay | Admitting: Internal Medicine

## 2014-09-30 ENCOUNTER — Ambulatory Visit (INDEPENDENT_AMBULATORY_CARE_PROVIDER_SITE_OTHER): Payer: PRIVATE HEALTH INSURANCE | Admitting: Family Medicine

## 2014-09-30 ENCOUNTER — Encounter: Payer: Self-pay | Admitting: Family Medicine

## 2014-09-30 VITALS — BP 123/81 | HR 92 | Temp 98.1°F | Ht 67.0 in | Wt 177.0 lb

## 2014-09-30 DIAGNOSIS — R1084 Generalized abdominal pain: Secondary | ICD-10-CM

## 2014-09-30 DIAGNOSIS — Z0000 Encounter for general adult medical examination without abnormal findings: Secondary | ICD-10-CM

## 2014-09-30 MED ORDER — LISINOPRIL 20 MG PO TABS: 20.0000 mg | ORAL_TABLET | Freq: Every day | ORAL | Status: DC

## 2014-09-30 NOTE — Patient Instructions (Signed)
It was nice to meet you today.  We need to review the records from Dr. Collene Mares and your old primary doctor. Please come back in about 3 weeks after we have received these and I have reviewed them.

## 2014-09-30 NOTE — Progress Notes (Signed)
   Subjective:    Patient ID: Omar Sanders, male    DOB: 1941/10/13, 73 y.o.   MRN: [id number]  HPI  Here to establish care. In-person interpreter used Sin  CC: pain in abdomen, head, legs/feet  # New patient:  Previous PCP at "[facility]"  Reviewed PMH, PSH, FH, SH and updated in chart  FH: pt reports multiple "deaths" from "headaches" and "anxiety" in mother, older sisters. Still has 2 sisters alive, one of which is paraplegic.   Colonoscopy: done by Dr. Collene Mares 2 years ago  Flu shot: yes, 08/28/2014  Exercise: does daily stretching, not much aerobic/walking  # Abdominal pain/leg pain  Present since around May 2015.  Can't describe the type of pain. Says it starts in his abdomen, goes up to head and then down to legs. Pain in legs feels like "snake bite". Does not say that the pain radiates from the abdomen, just that pain shows up in head and legs.  Also has constant pain in feet. Pain in stomach is intermittent. He does not relate the pain to anything he does, not made worse with positions or foods  Says Dr. Collene Mares and his previous PCP have evaluated this pain already  Bowel pattern: reports some constipation and diarrhea, sometimes stools 2-3 times a day, can go several days without one. No dark or bloody stools. ROS: intermittent diarrhea/constipation, no fevers/chills, no numbness or weakness of legs, no headaches, no CP, no SOB, no rashes, no dysuria, no hematochezia, no heartburn, no changes in vision  Review of Systems   See HPI for ROS. All other systems reviewed and are negative.  Past medical history, surgical, family, and social history reviewed and updated in the EMR as appropriate. Objective:  BP 123/81 mmHg  Pulse 92  Temp(Src) 98.1 F (36.7 C) (Oral)  Ht 5\' 7"  (1.702 m)  Wt 177 lb (80.287 kg)  BMI 27.72 kg/m2 Vitals reviewed  General: NAD HEENT: PERRL, EOMI. No oropharyngeal lesions. CV: RRR, normal s1 and s2, no m/r/g Resp: CTAB, normal  effort Abdomen: soft, mildly tender diffusely, non-focal, no rebound or guarding. Nondistended, no organomegaly. Bowel sounds normal.  Ext: no edema or cyanosis, WWP. Neuro: alert and oriented, non-focal. Sensation grossly intact LE, gait normal.   Assessment & Plan:  See Problem List Documentation

## 2014-10-01 ENCOUNTER — Encounter: Payer: Self-pay | Admitting: Family Medicine

## 2014-10-01 DIAGNOSIS — Z0000 Encounter for general adult medical examination without abnormal findings: Secondary | ICD-10-CM | POA: Insufficient documentation

## 2014-10-01 NOTE — Assessment & Plan Note (Addendum)
Colonoscopy up to date? Yes.  Influenza? Yes. Plan: ROI for records from PCP and GI.

## 2014-10-02 ENCOUNTER — Other Ambulatory Visit: Payer: Self-pay | Admitting: *Deleted

## 2014-10-03 MED ORDER — OMEGA-3 FISH OIL/VITAMIN D3 1000-1000 MG-UNIT PO CAPS: 1.0000 | ORAL_CAPSULE | Freq: Every day | ORAL | Status: DC

## 2014-10-03 MED ORDER — PANTOPRAZOLE SODIUM 40 MG PO TBEC: 40.0000 mg | DELAYED_RELEASE_TABLET | Freq: Every day | ORAL | Status: DC

## 2014-10-04 NOTE — Assessment & Plan Note (Signed)
No clear etiology and has previous workup. No new prescriptions today, will obtain records from GI and PCP and have him come back to discuss this further.

## 2014-10-05 ENCOUNTER — Other Ambulatory Visit: Payer: Self-pay | Admitting: *Deleted

## 2014-10-05 NOTE — Telephone Encounter (Signed)
Pt came in today requesting a refill on his fentanyl patches.  Informed him that I would have to send this request to the MD. Omar Sanders

## 2014-10-08 NOTE — Telephone Encounter (Signed)
Can you call the patient and let him know we will discuss this at his visit on 12/18? Thanks. -Dr. Lamar Benes

## 2014-10-09 NOTE — Telephone Encounter (Signed)
LMOVM for pt to return call.  Pt speaks [place]. Elenna Spratling, Salome Spotted

## 2014-10-16 ENCOUNTER — Ambulatory Visit (INDEPENDENT_AMBULATORY_CARE_PROVIDER_SITE_OTHER): Payer: PRIVATE HEALTH INSURANCE | Admitting: Family Medicine

## 2014-10-16 VITALS — BP 130/77 | HR 62 | Temp 97.7°F | Wt 175.8 lb

## 2014-10-16 DIAGNOSIS — G894 Chronic pain syndrome: Secondary | ICD-10-CM

## 2014-10-16 DIAGNOSIS — K219 Gastro-esophageal reflux disease without esophagitis: Secondary | ICD-10-CM

## 2014-10-16 DIAGNOSIS — R1084 Generalized abdominal pain: Secondary | ICD-10-CM

## 2014-10-16 MED ORDER — SUCRALFATE 1 G PO TABS: 1.0000 g | ORAL_TABLET | Freq: Three times a day (TID) | ORAL | Status: DC

## 2014-10-16 MED ORDER — FENTANYL 25 MCG/HR TD PT72: 25.0000 ug | MEDICATED_PATCH | TRANSDERMAL | Status: DC

## 2014-10-16 MED ORDER — PSYLLIUM 0.52 G PO CAPS: 0.5200 g | ORAL_CAPSULE | Freq: Two times a day (BID) | ORAL | Status: DC

## 2014-10-16 NOTE — Progress Notes (Signed)
   Subjective:    Patient ID: Omar Sanders, male    DOB: 1941/07/27, 73 y.o.   MRN: [id number]  HPI  In-person Interpreter Lek siu  CC: follow up  # Lower abdomen pain:  Only gets leg pain when he has the lower stomach pain.   Stomach swollen/pain, affects pain "swelling"  Last BM was this morning; normal but dry. Complains of having difficulty with BMs and feels constipated.   Diet: Rice, vegetables, meats, fish.   Does note to be very gassy with milk.  ROS: no dark or bloody stools,  # GERD  Complains of burning after some meals  Taking his protonix as prescribed ROS: no vomiting, no hematemesis  # Chronic pain  Has long history of chronic pain, in shoulders, knees  Review of previous PCP shows multiple medications tried including gabapentin, tramadol; reports fentanyl patch only thing that really controls his pain  Review of Systems   See HPI for ROS. All other systems reviewed and are negative.  Past medical history, surgical, family, and social history reviewed and updated in the EMR as appropriate. Objective:  BP 130/77 mmHg  Pulse 62  Temp(Src) 97.7 F (36.5 C) (Oral)  Wt 175 lb 12.8 oz (79.742 kg) Vitals reviewed  General: NAD CV: RRR, normal s1s2 no murmurs. 2+ radial pulses Resp: clear bilaterally Abdomen: soft, mildly tender lower abdomen, nondistended, normal bowel sounds Ext: no edema or cyanosis Neuro: alert and oriented, no deficits noted   Assessment & Plan:  See Problem List Documentation

## 2014-10-16 NOTE — Patient Instructions (Addendum)
We will start a fiber supplement called psyllium or metamucil. Take this every day. To help calm your stomach from heartburn, continue the protonix and start taking the sucralfate with each meal.   It sounds like you are lactose intolerant, which means your body can't digest the sugars in milk. I would AVOID all of these for at least 1 month. If you notice a difference, then you are likely unable to digest them. You can take an over the counter supplement called Metamucil  If possible, try to avoid eating these foods for several weeks and see how you feel:  Wheat, barley, rye, onion, leek, white part of spring onion, garlic, shallots, artichokes, beetroot, fennel, peas, chicory, pistachio, cashews, legumes, lentils, and chickpeas  Milk, custard, ice cream, and yogurt  Apples, pears, mangoes, cherries, watermelon, asparagus, sugar snap peas, honey, high-fructose corn syrup  Apricots, cherries, nectarines, peaches, plums, watermelon, mushrooms, cauliflower, artificially sweetened chewing gum and confectionery

## 2014-10-20 NOTE — Assessment & Plan Note (Signed)
Symptoms not well controlled on protonix. Plan: add sucralfate with meals

## 2014-10-20 NOTE — Assessment & Plan Note (Signed)
Reviewed notes from prior PCP, had fairly extensive workup for this issue with multiple medications tried, ended up with primary working diagnosis IBS. By report today constipation still remains as an issue for him. Plan: start daily psyllium, may need to add miralax. Consider SSRI.

## 2014-10-26 ENCOUNTER — Telehealth: Payer: Self-pay | Admitting: Family Medicine

## 2014-10-26 NOTE — Telephone Encounter (Signed)
Pt has been having a hard time going to the bathroom, was given 2 meds to help, still having problems having bowel movements and now having trouble urinating. Dr. Lamar Benes is booked up and no one else has any openings this week. Wants to know if Dr. Lamar Benes can call for advice.

## 2014-10-27 MED ORDER — POLYETHYLENE GLYCOL 3350 17 GM/SCOOP PO POWD: 34.0000 g | Freq: Two times a day (BID) | ORAL | Status: DC | PRN

## 2014-10-27 NOTE — Telephone Encounter (Signed)
Titrate miralax to bowel movement

## 2014-10-28 NOTE — Telephone Encounter (Signed)
Pt is aware of medication.  Nuchem Grattan,CMA

## 2014-11-18 ENCOUNTER — Ambulatory Visit (INDEPENDENT_AMBULATORY_CARE_PROVIDER_SITE_OTHER): Payer: Medicare Other | Admitting: Family Medicine

## 2014-11-18 ENCOUNTER — Encounter: Payer: Self-pay | Admitting: Family Medicine

## 2014-11-18 VITALS — BP 124/80 | HR 72 | Wt 180.0 lb

## 2014-11-18 DIAGNOSIS — K219 Gastro-esophageal reflux disease without esophagitis: Secondary | ICD-10-CM

## 2014-11-18 DIAGNOSIS — I10 Essential (primary) hypertension: Secondary | ICD-10-CM

## 2014-11-18 DIAGNOSIS — R51 Headache: Secondary | ICD-10-CM

## 2014-11-18 DIAGNOSIS — M79605 Pain in left leg: Secondary | ICD-10-CM

## 2014-11-18 DIAGNOSIS — R1084 Generalized abdominal pain: Secondary | ICD-10-CM

## 2014-11-18 DIAGNOSIS — G44219 Episodic tension-type headache, not intractable: Secondary | ICD-10-CM

## 2014-11-18 DIAGNOSIS — R519 Headache, unspecified: Secondary | ICD-10-CM | POA: Insufficient documentation

## 2014-11-18 DIAGNOSIS — M79604 Pain in right leg: Secondary | ICD-10-CM

## 2014-11-18 LAB — BASIC METABOLIC PANEL
BUN: 21 mg/dL (ref 6–23)
CO2: 27 mEq/L (ref 19–32)
Calcium: 9.3 mg/dL (ref 8.4–10.5)
Chloride: 103 mEq/L (ref 96–112)
Creat: 1.71 mg/dL — ABNORMAL HIGH (ref 0.50–1.35)
Glucose, Bld: 93 mg/dL (ref 70–99)
Potassium: 3.9 mEq/L (ref 3.5–5.3)
Sodium: 141 mEq/L (ref 135–145)

## 2014-11-18 MED ORDER — ALPRAZOLAM 1 MG PO TABS: 1.0000 mg | ORAL_TABLET | Freq: Two times a day (BID) | ORAL | Status: DC | PRN

## 2014-11-18 MED ORDER — OMEPRAZOLE 20 MG PO CPDR: 20.0000 mg | DELAYED_RELEASE_CAPSULE | Freq: Every day | ORAL | Status: DC

## 2014-11-18 NOTE — Progress Notes (Signed)
   Subjective:    Patient ID: Omar Sanders, male    DOB: [birth year], 74 y.o.   MRN: [id number]  Phone interpreter Velna Hatchet used: ID [phone] [place]  HPI  CC: follow up  # Abdominal pain/constipation:  Improved significantly since last visit on prescribed regimen  Taking miralax, psyllium  Having 1-2 BMs a day  # GERD  Sucralfate did not help him, felt like he got bloated  Believes he has become resistant to pantoprazole  # Leg pain  Continues to have bilateral lower leg pain  Describes as burning, numbness, cramping  Not worsened with moving, actually says worse in the morning when he wakes up or after taking a nap  States used multiple medications in the past, only thing that helped was fentanyl patch  Does say he uses "cramp" cream that helps  Notes he also has cramps in his hand  # Headache  States he has been getting headaches recently when "angry" and upset  Would try taking alprazolam for the headaches, minimal help  Had not tried any OTC pain medications  No assicated n/v, no photo or phonophobia  Review of Systems   See HPI for ROS. All other systems reviewed and are negative.  Past medical history, surgical, family, and social history reviewed and updated in the EMR as appropriate. Objective:  BP 124/80 mmHg  Pulse 72  Wt 180 lb (81.647 kg) Vitals reviewed  General: NAD, pleasant CV: RRR, normal heart sounds, no mrg Resp: decreased braeth sounds bilaterally but normal WOB, no wheezes/crackles/rhonchi Ext: no edema or cyanosis  Assessment & Plan:  See Problem List Documentation

## 2014-11-18 NOTE — Patient Instructions (Signed)
Headaches: Make sure you stay hydrated with water Get regular sleep Try taking TYLENOL, IBUPROFEN, or ALEVE. Try all 3 until you find one that you feel helps the best.  Tylenol/Acetaminophen: 325 to 650mg  every 4-6 hours as needed for headache and pain Ibuprofen: 200-400mg  every 4-6 hours as needed for headache and pain Aleve/Naproxen: 250-500mg  every 12-24 hours  Constipation: Continue with the psyllium/fiber, miralax (PEG 3350) for your constipation. Goal is at least 1 stool a day  We will get lab work and I will review your previous medications for your leg pain.

## 2014-11-18 NOTE — Assessment & Plan Note (Signed)
Significant improvement in symptoms with bowel regimen and constipation sounds resolved by report. Will plan on continuing miralax and psyllium, titrate to 1-2 BMs a day

## 2014-11-18 NOTE — Assessment & Plan Note (Signed)
Did not tolerate sucralfate. Would like to try switching from protonix to omeprazole.  Plan: d/c protonix, start omeprazole, may try adding tums with meals since he didn't tolerate sucralfate

## 2014-11-18 NOTE — Assessment & Plan Note (Signed)
Describes possible neuropathy, doubt vascular claudication, no weakness. States fentanyl helped in the past. I would favor starting gabapentin, however will review paper chart received from prior PCP to see if he has been on this first. With cramping in hands as well, will check bmet for electrolyte imbalance as pt is on lasix.

## 2014-11-18 NOTE — Assessment & Plan Note (Signed)
Tension type. Pt was on alprazolam from previous doctor that he had been using for this, ?anxiety component. Counseled regarding staying hydrated, getting regular sleep, OTC pain medications tylenol/ibuprofen/aleve. Refill alprazolam #30, re-address this at next visit and discuss specific indications from prior PCP notes.

## 2014-12-02 ENCOUNTER — Other Ambulatory Visit: Payer: Self-pay | Admitting: Family Medicine

## 2014-12-02 NOTE — Telephone Encounter (Signed)
Needs refill on alprazolam and linisopril

## 2014-12-04 ENCOUNTER — Other Ambulatory Visit: Payer: Self-pay | Admitting: *Deleted

## 2014-12-04 DIAGNOSIS — I10 Essential (primary) hypertension: Secondary | ICD-10-CM

## 2014-12-04 MED ORDER — LISINOPRIL 20 MG PO TABS: 20.0000 mg | ORAL_TABLET | Freq: Every day | ORAL | Status: DC

## 2014-12-28 ENCOUNTER — Encounter: Payer: Self-pay | Admitting: Family Medicine

## 2014-12-28 ENCOUNTER — Ambulatory Visit (INDEPENDENT_AMBULATORY_CARE_PROVIDER_SITE_OTHER): Payer: Medicare Other | Admitting: Family Medicine

## 2014-12-28 VITALS — BP 101/63 | HR 70 | Temp 98.1°F | Resp 18 | Wt 177.0 lb

## 2014-12-28 DIAGNOSIS — R1084 Generalized abdominal pain: Secondary | ICD-10-CM

## 2014-12-28 DIAGNOSIS — F411 Generalized anxiety disorder: Secondary | ICD-10-CM

## 2014-12-28 MED ORDER — DOCUSATE SODIUM 100 MG PO CAPS: 100.0000 mg | ORAL_CAPSULE | Freq: Two times a day (BID) | ORAL | Status: DC

## 2014-12-28 MED ORDER — ALPRAZOLAM 1 MG PO TABS: 1.0000 mg | ORAL_TABLET | Freq: Every day | ORAL | Status: DC | PRN

## 2014-12-28 MED ORDER — NORTRIPTYLINE HCL 25 MG PO CAPS: 25.0000 mg | ORAL_CAPSULE | Freq: Every day | ORAL | Status: DC

## 2014-12-28 NOTE — Progress Notes (Signed)
   Subjective:    Patient ID: Omar Sanders, male    DOB: 1941-09-13, 74 y.o.   MRN: [id number]  In-person interpretor used [employer] Ms. Siu  HPI  CC: follow up  # Abdominal pain/bloating:  Feels it has gotten worse  Stopped taking miralax, feels it is making him more bloated. Still taking psylllium  Not having as regular bowel movements recently ROS: no hematochezia  # Anxiety  Continues to have daily symptoms  Took xanax rx last time very quickly, requesting additional refill  # headache:   He feels this comes from both anxiety and his abdominal pain  Not taking anything other than xanax for the headache  Review of Systems   See HPI for ROS. All other systems reviewed and are negative.  Past medical history, surgical, family, and social history reviewed and updated in the EMR as appropriate. Objective:  BP 101/63 mmHg  Pulse 70  Temp(Src) 98.1 F (36.7 C) (Oral)  Resp 18  Wt 177 lb (80.287 kg)  SpO2 95% Vitals and nursing note reviewed  General: NAD CV: RRR, normal heart sounds no mrg Resp: CTAB Abdomen: soft, nontender, tympanic to percussion, normal bowel sounds   Assessment & Plan:  See Problem List Documentation

## 2014-12-28 NOTE — Patient Instructions (Addendum)
Bowels: Colace and psyllium twice a day Miralax as needed for when you don't have a bowel movement for 2 days  Start nortriptyline 25mg  once a day at night. If you do well with this dose we will increase in 1 month. Use the xanax ONLY as needed for severe anxiety

## 2014-12-29 DIAGNOSIS — F411 Generalized anxiety disorder: Secondary | ICD-10-CM | POA: Insufficient documentation

## 2014-12-29 NOTE — Assessment & Plan Note (Addendum)
Continue to feel strong anxiety component contributing to headaches and IBS symptoms. Will start nortriptyline 25mg  nightly for dual benefit with IBS. F/u 1 month

## 2014-12-29 NOTE — Assessment & Plan Note (Signed)
Pt reports worsened symptoms, also no longer taking miralax because he felt it made him worse. Not having regular BMs. Add colace, continue psyllium. Feel leading ddx continues to be IBS. Will add nortriptyline at night for dual benefit of IBS and anxiety/headache.

## 2014-12-30 ENCOUNTER — Telehealth: Payer: Self-pay | Admitting: Family Medicine

## 2014-12-30 NOTE — Telephone Encounter (Signed)
Pt called because he said the pharmacy doesn't have his refills on Nortriptyline, Docusate sodium, and Xanax. jw

## 2014-12-30 NOTE — Telephone Encounter (Signed)
Called [facility] [address] and pharmacy stated pt already picked up refills. Jalynn Waddell, CMA.

## 2014-12-31 ENCOUNTER — Other Ambulatory Visit: Payer: Self-pay | Admitting: Family Medicine

## 2015-01-01 NOTE — Telephone Encounter (Signed)
Refill request. I called [facility] for this med on 12/30/14 and verified if med was received and pharmacy stated that pt had already picked up med. Not sure why another refill request has been sent. Will forward to PCP for review. Meshach Perry, CMA.

## 2015-01-25 ENCOUNTER — Other Ambulatory Visit: Payer: Self-pay | Admitting: Family Medicine

## 2015-01-25 NOTE — Telephone Encounter (Signed)
Aprazolam needs refills... [facility] on [address]

## 2015-01-26 ENCOUNTER — Other Ambulatory Visit: Payer: Self-pay | Admitting: Family Medicine

## 2015-01-26 ENCOUNTER — Telehealth: Payer: Self-pay | Admitting: *Deleted

## 2015-01-26 NOTE — Telephone Encounter (Signed)
Received a call from pt stating he is out of Xanax.  Pt stated he is taking the medication 1 tablet every day.  Pt told he should only be taking it the medication as needed.  Pt request refill again.  Please advise. Appt 02/01/2015. Derl Barrow, RN

## 2015-01-26 NOTE — Telephone Encounter (Signed)
Refill request. Will forward to PCP for review. Doy Taaffe, CMA. 

## 2015-01-26 NOTE — Telephone Encounter (Signed)
Pt has an appt on 02/01/2015

## 2015-02-01 ENCOUNTER — Ambulatory Visit (INDEPENDENT_AMBULATORY_CARE_PROVIDER_SITE_OTHER): Payer: Medicare Other | Admitting: Family Medicine

## 2015-02-01 ENCOUNTER — Encounter: Payer: Self-pay | Admitting: Family Medicine

## 2015-02-01 VITALS — BP 119/77 | HR 74 | Temp 98.3°F | Ht 67.0 in | Wt 182.4 lb

## 2015-02-01 DIAGNOSIS — F411 Generalized anxiety disorder: Secondary | ICD-10-CM

## 2015-02-01 DIAGNOSIS — I10 Essential (primary) hypertension: Secondary | ICD-10-CM | POA: Diagnosis not present

## 2015-02-01 DIAGNOSIS — M7989 Other specified soft tissue disorders: Secondary | ICD-10-CM | POA: Diagnosis not present

## 2015-02-01 DIAGNOSIS — K589 Irritable bowel syndrome without diarrhea: Secondary | ICD-10-CM | POA: Diagnosis not present

## 2015-02-01 LAB — BASIC METABOLIC PANEL WITH GFR
BUN: 21 mg/dL (ref 6–23)
CO2: 25 mEq/L (ref 19–32)
Calcium: 8.7 mg/dL (ref 8.4–10.5)
Chloride: 103 mEq/L (ref 96–112)
Creat: 1.44 mg/dL — ABNORMAL HIGH (ref 0.50–1.35)
GFR, Est African American: 55 mL/min — ABNORMAL LOW
GFR, Est Non African American: 47 mL/min — ABNORMAL LOW
Glucose, Bld: 77 mg/dL (ref 70–99)
Potassium: 4 mEq/L (ref 3.5–5.3)
Sodium: 140 mEq/L (ref 135–145)

## 2015-02-01 MED ORDER — DOCUSATE SODIUM 100 MG PO CAPS: 100.0000 mg | ORAL_CAPSULE | Freq: Two times a day (BID) | ORAL | Status: DC

## 2015-02-01 MED ORDER — LISINOPRIL 20 MG PO TABS: 20.0000 mg | ORAL_TABLET | Freq: Every day | ORAL | Status: DC

## 2015-02-01 MED ORDER — DICYCLOMINE HCL 20 MG PO TABS: 20.0000 mg | ORAL_TABLET | Freq: Four times a day (QID) | ORAL | Status: DC | PRN

## 2015-02-01 MED ORDER — ALPRAZOLAM 1 MG PO TABS: 1.0000 mg | ORAL_TABLET | Freq: Every day | ORAL | Status: DC | PRN

## 2015-02-01 MED ORDER — PSYLLIUM 0.52 G PO CAPS: 0.5200 g | ORAL_CAPSULE | Freq: Two times a day (BID) | ORAL | Status: DC

## 2015-02-01 MED ORDER — OMEPRAZOLE 20 MG PO CPDR: 20.0000 mg | DELAYED_RELEASE_CAPSULE | Freq: Every day | ORAL | Status: DC

## 2015-02-01 MED ORDER — POLYETHYLENE GLYCOL 3350 17 GM/SCOOP PO POWD: 34.0000 g | Freq: Two times a day (BID) | ORAL | Status: DC | PRN

## 2015-02-01 NOTE — Assessment & Plan Note (Signed)
Bilateral pitting edema. Low suspicion for DVT, and has no risk factors for this nor evidence of PE. Can continue lasix, though doesn't have a diagnosis of CHF and wonder about the utility of it. Recommended raising legs when sitting/laying. If it persists will recommend compression stockings. Will check Bmet today given his Cr was up last check in January.

## 2015-02-01 NOTE — Assessment & Plan Note (Signed)
Pt had been prescribed xanax longterm at other PCP before transferring here. At last visit had discussed to take that medicine only as needed, however he called early for refill and was taking it daily. Discussed with him I will not prescribe this longterm and that it is not in his best interest to continue the medication, he verbalized understanding today. Counseled regarding longterm effects of the medication and that we will be titrating him off of it over several months. He again verbalized understanding. If his anxiety symptoms worsen may need to consider additional medication. F/u 6 weeks.

## 2015-02-01 NOTE — Assessment & Plan Note (Signed)
Stable/slightly improved when he takes colace, though difficult to assess his symptoms because he tends to not answer direct questions. He was not taking colace daily, and had worsened abdominal pain until he took it again. He does now state he had some pain with urination while constipated, but did not have difficulty with urinary stream and pain resolved with BM; may need to consider prostate exam if this symptom continues. No improvement with nortriptyline, so will discontinue that now. Stressed importance of daily colace. F/u 6 weeks.

## 2015-02-01 NOTE — Patient Instructions (Addendum)
Take the Colace EVERY single day, regardless of how much you eat. This medicine keeps your stool soft and will help prevent constipation.  STOP taking the nortriptyline.   We are going to work on decreasing the amount of Alprazolam you are using. Right now you should be taking 1mg  (1 tablet) every day AS NEEDED. Your goal is to use this only when you absolutely need it. You can decrease your use by either skipping days, or also by decreasing the amount and only using one half (1/2) of a tablet.  For your leg swelling: You can continue furosemide 1-2 times a day (1 tablet) Most importantly, ELEVATE or raise your legs when you are sitting or laying down and not walking We will check your lab work for kidney function.

## 2015-02-01 NOTE — Progress Notes (Signed)
   Subjective:    Patient ID: Omar Sanders, male    DOB: 08/17/1941, 74 y.o.   MRN: [id number]  HPI  In-person interpreter used: [employer] - Meryle Ready  CC: follow up  # IBS / abdominal pain:  BMs: about once a day, unable to say if he had   Lower abdominal pain that is the same as it has always been: worse in afternoon. Also endorses today he gets pain with urination when he is constipated, but not normally  Nortriptyline has not helped, he feels it gives him a headache.   Feels colace is helping, not taking when eating smaller amounts and then he notices he feels more constipated ROS: No diarrhea, no nausea, no vomiting, no blood in stool  # Anxiety / depression  PHQ9 6 (2,1,0,3,0,0,0,0,0)  GAD7 3 (1,0,1,0,0,0,1)  No other changes, still feels the need to take xanax as it was prescribed by his previous doctor and he says he gets a headache when he does not take the xanax (improves when he does take one)  Has been taking the xanax daily  # Leg swelling  Beginning of March. Right > left  He has had this in the past  Legs are not swollen, hot, or red  Has taken lasix 20mg  twice a day, peeing normal amount, slightly improved the swelling  Not painful ROS: no SOB, no numbness or tingling of LE  Review of Systems   See HPI for ROS. All other systems reviewed and are negative.  Past medical history, surgical, family, and social history reviewed and updated in the EMR as appropriate. Objective:  BP 119/77 mmHg  Pulse 74  Temp(Src) 98.3 F (36.8 C) (Oral)  Ht 5\' 7"  (1.702 m)  Wt 182 lb 6 oz (82.725 kg)  BMI 28.56 kg/m2 Vitals and nursing note reviewed  General: NAD CV: RRR, normal s1s2 no mrg Resp: ctab nl effort Abdomen: soft, nontender, nondistended, normal bowel sounds Ext: 2+ edema bilaterally R>L. R leg circ 39.5cm, L leg 38.5cm Skin: no LE erythema.  Assessment & Plan:  See Problem List Documentation

## 2015-02-01 NOTE — Telephone Encounter (Signed)
Discussed at office visit.

## 2015-02-04 ENCOUNTER — Encounter: Payer: Self-pay | Admitting: Family Medicine

## 2015-03-10 ENCOUNTER — Other Ambulatory Visit: Payer: Self-pay

## 2015-03-10 ENCOUNTER — Other Ambulatory Visit: Payer: Self-pay | Admitting: Family Medicine

## 2015-03-10 ENCOUNTER — Ambulatory Visit (INDEPENDENT_AMBULATORY_CARE_PROVIDER_SITE_OTHER): Payer: Medicare Other | Admitting: Family Medicine

## 2015-03-10 VITALS — BP 104/62 | HR 60 | Temp 97.7°F | Wt 174.6 lb

## 2015-03-10 DIAGNOSIS — M62830 Muscle spasm of back: Secondary | ICD-10-CM

## 2015-03-10 DIAGNOSIS — K589 Irritable bowel syndrome without diarrhea: Secondary | ICD-10-CM | POA: Diagnosis not present

## 2015-03-10 MED ORDER — SENNOSIDES-DOCUSATE SODIUM 8.6-50 MG PO TABS: 1.0000 | ORAL_TABLET | Freq: Two times a day (BID) | ORAL | Status: DC

## 2015-03-10 NOTE — Telephone Encounter (Signed)
Pt called and said he needed refills on his medications. I tried to understand which ones but he was confused on which ones. jw

## 2015-03-10 NOTE — Progress Notes (Signed)
   Subjective:    Patient ID: Omar Sanders, male    DOB: Jun 05, 1941, 74 y.o.   MRN: [id number]  HPI  In-person interpreter from [facility] used  CC: Leg swelling  # Abdomen and leg swelling:  If he has bowel movements, and abdomen bloating = feet swelling... At same time he has dizziness and ear ache.   Symptoms occur when he does not take the stomach medicine. He has some confusion with regards to his medications (presumably because he has a bunch left at home from his prior PCP)... He says he has issues with the miralax so doesn't like taking it, never picked up the psyllium despite leading me to believe had been on it for the past few visits.   Took some amitiza from prior PCP but casued itching around his rectum ROS: no hematochezia or melena  # Back pain  Started about 4 weeks ago. Has had 3 similar episodes in the past  Back feels very tight on both sides  Has not tried to take anything for this  No recent injury or increased activity to cause it ROS: no weakness/numbness/tingling of LE  Review of Systems   See HPI for ROS. All other systems reviewed and are negative.  Past medical history, surgical, family, and social history reviewed and updated in the EMR as appropriate. Objective:  BP 104/62 mmHg  Pulse 60  Temp(Src) 97.7 F (36.5 C) (Oral)  Wt 174 lb 9.6 oz (79.198 kg)  SpO2 99% Vitals and nursing note reviewed  General: NAD Abdomen: soft, mildly tender lower quadrants but no rebound or guarding. Tympanic to percussion. Normal/slightly increased bowel sounds Back: spasms noted bilaterally left side > right, low back above iliac crest Ext: no edema, no cyanosis  Assessment & Plan:  See Problem List Documentation

## 2015-03-10 NOTE — Patient Instructions (Addendum)
Ask the Pharmacist for FIBER supplements. This is sometimes called psyllium. Take with each meal.  Also take the sennakot, start with once a day for a week and then increase to twice a day if needed.   Your back pain is likely a muscle spasm. Use heating pads daily; you can buy reusable ones that the pharmacy may have, or a health good store. Stretching and being as physically active are the best long term treatments.

## 2015-03-12 ENCOUNTER — Telehealth: Payer: Self-pay | Admitting: Family Medicine

## 2015-03-12 NOTE — Telephone Encounter (Signed)
Needs refill on dicyclomine Notify pt when ti has been called in

## 2015-03-12 NOTE — Telephone Encounter (Signed)
Will forward to MD again.  Patient has already requested this medication in a previous note. Cayman Brogden,CMA

## 2015-03-13 MED ORDER — DICYCLOMINE HCL 20 MG PO TABS: 20.0000 mg | ORAL_TABLET | Freq: Four times a day (QID) | ORAL | Status: DC | PRN

## 2015-03-15 DIAGNOSIS — M5442 Lumbago with sciatica, left side: Secondary | ICD-10-CM | POA: Insufficient documentation

## 2015-03-15 MED ORDER — ALPRAZOLAM 1 MG PO TABS: 1.0000 mg | ORAL_TABLET | Freq: Every day | ORAL | Status: DC | PRN

## 2015-03-15 NOTE — Telephone Encounter (Signed)
Refills sent for rx. Phoned in xanax 1mg  #25 refill 0. -Dr. Lamar Benes

## 2015-03-15 NOTE — Assessment & Plan Note (Signed)
3 similar episodes, multiple muscle spasms noted on exam. Would avoid flexeril, conservative treatment with stretching, early activity, heating pads, if still not improved can consider baclofen/tizanidine.

## 2015-03-15 NOTE — Assessment & Plan Note (Signed)
Chronic/persistent with predominately constipation. Pt reports today he never actually took the psyllium, and miralax made him feel bloated/leg swelling. Rx for bentyl, will add senna (had tried colace without any reported effect). At this point feel like he may need GI referral as both prior PCP and myself have been unable to get his symptoms under control. F/u 2 months on current regimen.

## 2015-03-16 NOTE — Telephone Encounter (Signed)
I phoned in the rx.

## 2015-03-25 ENCOUNTER — Ambulatory Visit: Payer: Medicare Other | Admitting: Family Medicine

## 2015-05-01 ENCOUNTER — Other Ambulatory Visit: Payer: Self-pay | Admitting: Family Medicine

## 2015-05-06 ENCOUNTER — Ambulatory Visit (INDEPENDENT_AMBULATORY_CARE_PROVIDER_SITE_OTHER): Payer: Medicare Other | Admitting: Family Medicine

## 2015-05-06 ENCOUNTER — Encounter: Payer: Self-pay | Admitting: Family Medicine

## 2015-05-06 VITALS — BP 110/63 | HR 74 | Temp 97.9°F | Wt 175.5 lb

## 2015-05-06 DIAGNOSIS — K589 Irritable bowel syndrome without diarrhea: Secondary | ICD-10-CM

## 2015-05-06 DIAGNOSIS — N400 Enlarged prostate without lower urinary tract symptoms: Secondary | ICD-10-CM

## 2015-05-06 DIAGNOSIS — F411 Generalized anxiety disorder: Secondary | ICD-10-CM

## 2015-05-06 MED ORDER — TAMSULOSIN HCL 0.4 MG PO CAPS: 0.4000 mg | ORAL_CAPSULE | Freq: Every day | ORAL | Status: DC

## 2015-05-06 MED ORDER — ALPRAZOLAM 1 MG PO TABS: 0.5000 mg | ORAL_TABLET | Freq: Every day | ORAL | Status: DC | PRN

## 2015-05-06 MED ORDER — LUBIPROSTONE 24 MCG PO CAPS: 24.0000 ug | ORAL_CAPSULE | Freq: Two times a day (BID) | ORAL | Status: DC

## 2015-05-06 NOTE — Assessment & Plan Note (Signed)
Pt restart amitiza (rx by prior PCP) with good improvement of symptoms. Will continue this along with colace and discontinue dicyclomine. F/u 2 months.

## 2015-05-06 NOTE — Assessment & Plan Note (Signed)
Pt now taking 1/2 tab every day. He continues to state he takes this for headaches. Again emphasized titrating down. Refill #25 should last until at least September, encouraged every other day dosing now that he titrated dose in half. F/u 2 months.

## 2015-05-06 NOTE — Progress Notes (Signed)
   Subjective:    Patient ID: Omar Sanders AGE, male    DOB: 07-30-1941, 74 y.o.   MRN: [id number]  HPI  In-person interpreter utilized  CC: follow up  # IBS/Constipation:  Taking colace BID, dicyclomine once a day, also started adding on amitiza that he had from prior PCP  He feels the [place] allows him to go to the bathroom every day when used with the colace  He denies having issues with diarrhea ROS: some abdominal cramping  # Anxiety  Taking the xanax 1/2 tablet every day  He again reports primarily taking this medicine because he develops headaches  # Difficulty urinating  Requests refill for lasix  He says in the afternoon he tends to have difficulty with urinating, gets urge to void but ends up not peeing or having urine dribble ROS: no blood in urine, no dysuria  Review of Systems   See HPI for ROS. All other systems reviewed and are negative.  Past medical history, surgical, family, and social history reviewed and updated in the EMR as appropriate. Objective:  BP 110/63 mmHg  Pulse 74  Temp(Src) 97.9 F (36.6 C)  Wt 175 lb 8 oz (79.606 kg) Vitals and nursing note reviewed  General: NAD CV: RRR, nl s1s2 no mrg. No LE edema Resp: CTAB nl effort Abdomen: soft, nontender, minimally distended with some tympany to percussion, bowel sounds slightly hyperactive  Assessment & Plan:  See Problem List Documentation

## 2015-05-06 NOTE — Assessment & Plan Note (Signed)
Near end of visit pt brought up complaints with difficulty urinating. My symptoms it sounds like he is having dribbling and difficulty starting stream that would be more consistent with BPH. 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 F/u 2 months.

## 2015-05-06 NOTE — Patient Instructions (Signed)
Stop taking the Dicyclomine (Bentyl)  Continue the Colace twice a day Continue the Amitiza up to twice a day  Start taking the Xanax/Alprazolam every other day as you can tolerate

## 2015-05-14 ENCOUNTER — Encounter: Payer: Self-pay | Admitting: *Deleted

## 2015-06-15 ENCOUNTER — Other Ambulatory Visit: Payer: Self-pay | Admitting: Family Medicine

## 2015-06-15 NOTE — Telephone Encounter (Signed)
Refill request from pharmacy. Will forward to PCP for review. Nicolas Sisler, CMA. 

## 2015-06-18 MED ORDER — ALPRAZOLAM 1 MG PO TABS: 0.5000 mg | ORAL_TABLET | Freq: Every day | ORAL | Status: DC | PRN

## 2015-07-07 ENCOUNTER — Ambulatory Visit: Payer: Medicare Other | Admitting: Family Medicine

## 2015-07-12 ENCOUNTER — Encounter: Payer: Self-pay | Admitting: Family Medicine

## 2015-07-12 ENCOUNTER — Ambulatory Visit (INDEPENDENT_AMBULATORY_CARE_PROVIDER_SITE_OTHER): Payer: Medicare Other | Admitting: Family Medicine

## 2015-07-12 VITALS — BP 132/63 | HR 80 | Temp 98.3°F | Ht 67.0 in | Wt 178.0 lb

## 2015-07-12 DIAGNOSIS — K589 Irritable bowel syndrome without diarrhea: Secondary | ICD-10-CM

## 2015-07-12 DIAGNOSIS — N400 Enlarged prostate without lower urinary tract symptoms: Secondary | ICD-10-CM

## 2015-07-12 DIAGNOSIS — R739 Hyperglycemia, unspecified: Secondary | ICD-10-CM | POA: Diagnosis not present

## 2015-07-12 DIAGNOSIS — E785 Hyperlipidemia, unspecified: Secondary | ICD-10-CM

## 2015-07-12 DIAGNOSIS — F411 Generalized anxiety disorder: Secondary | ICD-10-CM

## 2015-07-12 LAB — LIPID PANEL
Cholesterol: 211 mg/dL — ABNORMAL HIGH (ref 125–200)
HDL: 30 mg/dL — ABNORMAL LOW (ref >=40)
LDL Cholesterol: 124 mg/dL (ref <130)
Total CHOL/HDL Ratio: 7 Ratio — ABNORMAL HIGH (ref <=5.0)
Triglycerides: 285 mg/dL — ABNORMAL HIGH (ref <150)
VLDL: 57 mg/dL — ABNORMAL HIGH (ref <30)

## 2015-07-12 LAB — POCT GLYCOSYLATED HEMOGLOBIN (HGB A1C): Hemoglobin A1C: 6.3

## 2015-07-12 MED ORDER — DICYCLOMINE HCL 20 MG PO TABS: 20.0000 mg | ORAL_TABLET | Freq: Two times a day (BID) | ORAL | Status: DC

## 2015-07-12 MED ORDER — LUBIPROSTONE 24 MCG PO CAPS: 24.0000 ug | ORAL_CAPSULE | Freq: Two times a day (BID) | ORAL | Status: DC

## 2015-07-12 NOTE — Assessment & Plan Note (Signed)
On 0.5mg  xanax daily. Newly noted today is that he seems to report he is taking it more for dizziness symptoms than the headaches that he had previously been talking about. Will continue to try and titrate this down, will need to elicit more information about dizziness symptoms.

## 2015-07-12 NOTE — Progress Notes (Signed)
   Subjective:    Patient ID: Omar Sanders, male    DOB: [birth year], 74 y.o.   MRN: [id number]  HPI  Remote interpreter [employer] [phone]  CC: follow up  # IBS:  States he feels like pain all around belly now, still primarily worse around belly button.   Says difficult to describe, most closely to cramping.   Daily bowel movements, still having bloated  Stool sometimes black  Always feels constipated  Last colonoscopy 3-4 years ago  Taking amitiza, bentyl, colace. Also apparently has taken nortriptyline again despite prescribed 1 month supply in February and reporting it wasn't helping/told to stop ROS: no nausea, no CP, no SOB  # Anxiety  Xanax, taking 1/2 tablet every day  Today he says he is primarily taking this medicine for dizziness, whereas in the past mentioned he was taking it for headaches  # BPH  Tamsulosin helped some with symptoms  Felt it was causing regurgitation of food and stopped taking it  Took for 1 week, symptoms started 3-4 days. Was taking it several minutes after eating ROS: no blood in urine, no dysuria  Review of Systems   See HPI for ROS.   Past medical history, surgical, family, and social history reviewed and updated in the EMR as appropriate. Objective:  BP 132/63 mmHg  Pulse 80  Temp(Src) 98.3 F (36.8 C) (Oral)  Ht 5\' 7"  (1.702 m)  Wt 178 lb (80.74 kg)  BMI 27.87 kg/m2 Vitals and nursing note reviewed  General: NAD CV: RRR, normal s1s2, no mrg. 2+ radial pulses bilaterally Resp: CTAB, normal effort Abdomen: mildly distended, tympanic to percussion, no ascites noted, TTP primarily below umbilicus but also somewhat diffusely as well, bowel sounds present/slightly increased Ext: trace LE edema  Assessment & Plan:  Irritable bowel syndrome Perplexing problem which has been ongoing for at least the past 2 years. On chart review the newly noted today was an abnormal CT abdomen from 2014 which was done shortly after bilateral inguinal  hernia repair demonstrating gas in retroperitoneum amongst other areas that is "Amount and distribution seems excessive and may indicate some form of gas leak or valve Mechanism." At this point I'm considering repeating the CT scan as he seems to have exhausted IBS treatments without much overall relief of symptoms. He does seem to take medications incorrectly, stopping and restarting medicines on the whim (for instance I had not prescribed amitiza and he reported taking it again last visit and it was at that time that I sent in a new rx since he said it was helping). I also wonder if trying a drug holiday and re-evaluate symptoms after a month would be useful. Will discuss this issue again in 2 weeks.  BPH (benign prostatic hyperplasia) Reported issues with tamsulosin causing food regurgitation. Encouraged to take it 30 minutes after meal and re-try as he did get some relief of symptoms. F/u 2 weeks, if food regurg is preventing him from continuing medicine will start finasteride. Again instructed to discontinue lasix (he again asked for a refill) as he does not have a clear indication for this.   Anxiety state On 0.5mg  xanax daily. Newly noted today is that he seems to report he is taking it more for dizziness symptoms than the headaches that he had previously been talking about. Will continue to try and titrate this down, will need to elicit more information about dizziness symptoms.

## 2015-07-12 NOTE — Assessment & Plan Note (Signed)
Perplexing problem which has been ongoing for at least the past 2 years. On chart review the newly noted today was an abnormal CT abdomen from 2014 which was done shortly after bilateral inguinal hernia repair demonstrating gas in retroperitoneum amongst other areas that is "Amount and distribution seems excessive and may indicate some form of gas leak or valve Mechanism." At this point I'm considering repeating the CT scan as he seems to have exhausted IBS treatments without much overall relief of symptoms. He does seem to take medications incorrectly, stopping and restarting medicines on the whim (for instance I had not prescribed amitiza and he reported taking it again last visit and it was at that time that I sent in a new rx since he said it was helping). I also wonder if trying a drug holiday and re-evaluate symptoms after a month would be useful. Will discuss this issue again in 2 weeks.

## 2015-07-12 NOTE — Patient Instructions (Signed)
For your difficulty urinating:  Restart Tamsulosin (flomax). Make sure you take this 1 tablet 30 minutes AFTER the same meal every day.   Come back in 2 weeks and report if you are still having those food/stomach issues with the medicine. If so, we can start another medicine that takes several months for full effect.  STOP nortriptyline.

## 2015-07-12 NOTE — Assessment & Plan Note (Signed)
Reported issues with tamsulosin causing food regurgitation. Encouraged to take it 30 minutes after meal and re-try as he did get some relief of symptoms. F/u 2 weeks, if food regurg is preventing him from continuing medicine will start finasteride. Again instructed to discontinue lasix (he again asked for a refill) as he does not have a clear indication for this.

## 2015-07-26 ENCOUNTER — Encounter: Payer: Self-pay | Admitting: Family Medicine

## 2015-07-26 ENCOUNTER — Ambulatory Visit (INDEPENDENT_AMBULATORY_CARE_PROVIDER_SITE_OTHER): Payer: Medicare Other | Admitting: Family Medicine

## 2015-07-26 VITALS — BP 123/64 | HR 62 | Temp 98.0°F | Ht 67.0 in | Wt 180.0 lb

## 2015-07-26 DIAGNOSIS — R1032 Left lower quadrant pain: Secondary | ICD-10-CM

## 2015-07-26 DIAGNOSIS — R1031 Right lower quadrant pain: Secondary | ICD-10-CM | POA: Diagnosis not present

## 2015-07-26 DIAGNOSIS — J982 Interstitial emphysema: Secondary | ICD-10-CM

## 2015-07-26 DIAGNOSIS — K589 Irritable bowel syndrome without diarrhea: Secondary | ICD-10-CM | POA: Diagnosis not present

## 2015-07-26 DIAGNOSIS — G8929 Other chronic pain: Secondary | ICD-10-CM

## 2015-07-26 LAB — POCT H PYLORI SCREEN: H Pylori Screen, POC: NEGATIVE

## 2015-07-26 MED ORDER — ALPRAZOLAM 1 MG PO TABS: 0.5000 mg | ORAL_TABLET | Freq: Every day | ORAL | Status: DC | PRN

## 2015-07-27 NOTE — Progress Notes (Signed)
   Subjective:    Patient ID: Omar Sanders, male    DOB: 22-Feb-1941, 74 y.o.   MRN: [id number]  HPI  CC: follow up  # Abdominal pain & bloating:  Symptoms same, really not improved  Pain in lower abdomen on both sides. Bloating in same area.  Taking bentyl, amitiza  BMs every 1-2 days  Says pain present for "2 years", when asked specifically about timing around his inguinal hernia repairs thinks it was present before ROS: no constipation, no heartburn  Social Hx: never smoker  Review of Systems   See HPI for ROS.   Past medical history, surgical, family, and social history reviewed and updated in the EMR as appropriate. Objective:  BP 123/64 mmHg  Pulse 62  Temp(Src) 98 F (36.7 C) (Oral)  Ht 5\' 7"  (1.702 m)  Wt 180 lb (81.647 kg)  BMI 28.19 kg/m2 Vitals and nursing note reviewed  General: NAD CV: RRR, normal s1s2, no murmurs/rub/gallop, 2+ radial pulses bilaterally Resp: clear to auscultation bilaterally, normal effort Abdomen: distended, slightly tense, TTP right and left lower abdomen. No organomegaly appreciated. No rebound or guarding. Bowel sounds normal. Neuro: alert and oriented, no focal deficits  Assessment & Plan:  No problem-specific assessment & plan notes found for this encounter.

## 2015-07-27 NOTE — Assessment & Plan Note (Signed)
Continues to have no improvement in bloating symptoms, pain. After review of CT abdomen in 2014 with pneumomediastinum, pneumoperitoneum (granted after inguinal hernia repair), discussed with Dr. Mingo Amber and feel at this point with persistent symptoms it is prudent to repeat the CT abdomen and also include chest due to pneumomediastinum. Continue current medications. F/u after CT. Also checked h pylori screen which was negative.

## 2015-07-30 ENCOUNTER — Ambulatory Visit (HOSPITAL_COMMUNITY)
Admission: RE | Admit: 2015-07-30 | Discharge: 2015-07-30 | Disposition: A | Discharge status: Home / Self Care | Payer: Medicare Other | Source: Ambulatory Visit | Attending: Family Medicine | Admitting: Family Medicine

## 2015-07-30 DIAGNOSIS — N400 Enlarged prostate without lower urinary tract symptoms: Secondary | ICD-10-CM | POA: Insufficient documentation

## 2015-07-30 DIAGNOSIS — R1030 Lower abdominal pain, unspecified: Secondary | ICD-10-CM | POA: Insufficient documentation

## 2015-07-30 DIAGNOSIS — Z9049 Acquired absence of other specified parts of digestive tract: Secondary | ICD-10-CM | POA: Insufficient documentation

## 2015-07-30 DIAGNOSIS — J982 Interstitial emphysema: Secondary | ICD-10-CM

## 2015-07-30 DIAGNOSIS — R1012 Left upper quadrant pain: Secondary | ICD-10-CM | POA: Diagnosis not present

## 2015-07-30 DIAGNOSIS — R1032 Left lower quadrant pain: Secondary | ICD-10-CM

## 2015-07-30 DIAGNOSIS — K5900 Constipation, unspecified: Secondary | ICD-10-CM | POA: Diagnosis not present

## 2015-07-30 DIAGNOSIS — I2510 Atherosclerotic heart disease of native coronary artery without angina pectoris: Secondary | ICD-10-CM | POA: Diagnosis not present

## 2015-07-30 DIAGNOSIS — G8929 Other chronic pain: Secondary | ICD-10-CM

## 2015-07-30 DIAGNOSIS — R1031 Right lower quadrant pain: Secondary | ICD-10-CM

## 2015-08-09 ENCOUNTER — Encounter: Payer: Self-pay | Admitting: Family Medicine

## 2015-08-09 ENCOUNTER — Ambulatory Visit (INDEPENDENT_AMBULATORY_CARE_PROVIDER_SITE_OTHER): Payer: Medicare Other | Admitting: Family Medicine

## 2015-08-09 VITALS — BP 127/70 | HR 70 | Temp 97.7°F | Wt 182.0 lb

## 2015-08-09 DIAGNOSIS — N400 Enlarged prostate without lower urinary tract symptoms: Secondary | ICD-10-CM

## 2015-08-09 DIAGNOSIS — K582 Mixed irritable bowel syndrome: Secondary | ICD-10-CM | POA: Diagnosis not present

## 2015-08-09 MED ORDER — FINASTERIDE 5 MG PO TABS: 5.0000 mg | ORAL_TABLET | Freq: Every day | ORAL | Status: DC

## 2015-08-09 NOTE — Patient Instructions (Addendum)
Characteristics and sources of common FODMAPs AVOID THESE FOODS - even eating a small amount can potentially bring back symptoms  F Fermentable  Alcohol  O Oligosaccharides Fructans, galacto-oligosaccharides Wheat, barley, rye, onion, leek, white part of spring onion, garlic, shallots, artichokes, beetroot, fennel, peas, chicory, pistachio, cashews, legumes, lentils, and chickpeas  D Disaccharides Lactose Milk, custard, ice cream, and yogurt  M Monosaccharides "Free fructose" (fructose in excess of glucose) Apples, pears, mangoes, cherries, watermelon, asparagus, sugar snap peas, honey, high-fructose corn syrup  And     P Polyols Sorbitol, mannitol, maltitol, and xylitol Apples, pears, apricots, cherries, nectarines, peaches, plums, watermelon, mushrooms, cauliflower, artificially sweetened chewing gum and confectionery    Vegetables and Legumes Want a more printer friendly chart? Go to the printable FODMAP diet chart page Garlic - avoid entirely if possible  Onions - avoid entirely if possible  Try Garlic and Onion infused oils  Artichoke  Asparagus  Baked beans  Beetroot  Black beans  Black eyed peas  Broad beans  Butter beans  Cassava  Cauliflower  Celery - greater than 5cm of stalk  Cho cho  Choko  Falafel  Haricot beans  Kidney beans  Lima beans  Leek bulb  Mange Tout  Mung beans  Mushrooms  Peas, sugar snap  Red kidney beans  Savoy Cabbage  Soy beans / soya beans  Split peas  Scallions / spring onions (bulb / white part)  Shallots  Taro Fruit - fruits can contain high fructose Apples  Apricots  Avocado  Blackberries  Boysenberry  Cherries  Currants  Custard apple  Dates  Feijoa  Figs  Goji berries  Grapefruit  Guava, unripe  Lychee  Mango  Nectarines  Paw paw, dried  Peaches  Pears  Persimmon  Pineapple, dried  Plums  Pomegranate  Prunes  Raisins  Sultanas  [place]  Tinned fruit in apple / pear juice  Watermelon Meats, Poultry and  Meat Substitutes Chorizo  Sausages  Processed meat - check ingredients Cereals, Grains, Breads, Biscuits, Pasta, Nuts and Cakes Wheat containing products such as (be sure to check labels):  Biscuits including chocolate chip biscuits  Bread, wheat - over 1 slice  Breadcrumbs  Cakes  Cereal bar, wheat based  Croissants  Crumpets  Egg noodles  Muffins  Pastries  Pasta, wheat over 1/2 cup cooked  Udon noodles  Wheat bran  Wheat cereals  Wheat flour  Wheat noodles  Wheat rolls  Wheatgerm Almond meal  Amaranth flour  Barley including flour  Bran cereals  Bread:  Granary bread  Multigrain bread  Naan  Oatmeal bread  Pumpernickel bread  Roti  Sourdough with kamut Cashews  Cous cous  Einkorn flour  Freekeh  Gnocchi  Granola bar  Muesli cereal  Muesli bar  Pistachios  Rye  Rye crispbread  Semolina  Spelt flour  These foods are low in FODMAP, so should be okay to eat Vegetables and Legumes Alfalfa  Bamboo shoots  Bean sprouts  Bok choy / pak choi  Broccoli - 1/2 cup  Brussel sprouts - 1 serving of 2 sprouts  Butternut squash - 1/4 cup  Cabbage, common and red up to 1 cup  Callaloo  Carrots  Celeriac  Celery - less than 5cm of stalk  Chicory leaves  Chick peas - 1/4 cup  Chilli - if tolerable  Chives  Cho cho  Choy sum  Collard greens  Corn / sweet corn - if tolerable and only in small amounts - 1/2 cob  Courgette  Cucumber  Eggplant / aubergine  Fennel  Green beans  Green pepper / green bell pepper / green capsicum  Ginger  Kale  Karela  Leek leaves  Lentils - in small amounts  Lettuce:  Butter lettuce  Iceberg lettuce  Radicchio lettuce  Red coral lettuce  Rocket lettuce Marrow  Okra  Olives  Parsnip  Peas, snow - 5 pods  Potato  Pumpkin  Pumpkin, canned - 1/4 cup, 2.2 oz  Radish  Red peppers / red bell pepper / red capsicum  Scallions / spring onions (green part)  Seaweed / nori  Silverbeet / chard  Spaghetti squash   Spinach, baby  Squash  Sun-dried tomatoes - 4 pieces  Swede  Swiss chard  Sweet potato - 1/2 cup  Tomato - canned, cherry, common, roma  Turnip  Water chestnuts  Yam  Zucchini Fruit Ackee  Bananas  Blueberries  Breadfruit  Carambola  Cantaloupe  Cranberry  Clementine  Dragon fruit  Grapes  Guava, ripe  Honeydew and [place] melons  Kiwifruit  Lemon including lemon juice  Lime including lime juice  Mandarin  Orange  Passion fruit  Paw paw  Papaya  Pineapple  Plantain, peeled  Raspberry  Rhubarb  Strawberry  Tamarind  Tangelo Meats, Poultry and Meat Substitutes Beef  Chicken  Kangaroo  Lamb  Pork  Prosciutto  Quorn, mince  [place]  Cold cuts / Optician, dispensing / cold meats such as ham and [place] breast Fish and Seafood Canned tuna  Fresh fish e.g.  Cod  Haddock  Plaice  Salmon  Trout  Tuna Seafood (ensuring nothing else is added) e.g.  Academic librarian  Mussels  [employer] Cereals, Grains, Breads, Biscuits, Pasta, Nuts and Cakes Wheat free breads  Gluten free breads  Bread:  Corn bread  Oat bread  Rice bread  Spelt sourdough bread  Potato flour bread Wheat free or gluten free pasta  Bread, wheat - 1 slice  Almonds - max of 15  Biscuit, savoury  Biscuit, shortbread - 1 only  B[place] nuts  Bulgur / bourghal - 1/4 cup cooked, 44g serving  Buckwheat  Buckwheat flour  Buckwheat noodles  Brown rice / whole grain rice  Chestnuts  Chips, plain / potato crisps, plain  Cornflour / maize  Crispbread  Corncakes  Cornflakes - 1/2 cup  Coconut - milk, cream, flesh  Corn tortillas, 3 tortillas  Crackers, plain  Hazelnuts - max of 15  Macadamia nuts  Millet  Mixed nuts  Oatmeal, 1/2 cup  Oats  Oatcakes  Peanuts  Pecans - max of 15  Pine nuts - max of 15  Polenta  Popcorn  Porridge and oat based cereals  Potato flour  Pretzels  Quinoa  Pasta, wheat - up to 1/2 cup cooked  Rice:  Basmati rice  Brown rice  Rice noodles   White rice Rice bran  Rice cakes  Rice crackers  Rice flakes  Rice flour  Rice Krispies  Seeds:  Chia seeds  Egusi seeds  Poppy seeds  Pumpkin seeds  Sesame seeds  Sunflower seeds Starch, maize, potato and tapioca  Sorghum  Tortilla chips / corn chips  Walnuts Condiments, Dips, Sweets, Sweeteners and Spreads Aspartame  Acesulfame K  Barbecue sauce  Capers in vinegar  Capers, salted  Chocolate:  Dark chocolate  Milk chocolate - 3 squares  White chocolate - 3 squares Chutney, 1 tablespoon  Fish sauce  Garlic infused oil  Golden syrup  Glucose  Jam / jelly, strawberry  Ketchup ([place]) - 1 sachet  Maple syrup  Marmalade  Marmite  Mayonnaise - ensuring no garlic or onion in ingredients  Miso paste  Mustard  Oyster sauce  Pesto sauce - less than 1 tbsp  Peanut butter  Rice malt syrup  Saccharine  Shrimp paste  Soy sauce  Stevia  Sweet and sour sauce  Sucralose  Sugar - also called sucrose  Tamarind paste  Tomato sauce (outside [place]) - 2 sachets, 13g  Vegemite  Vinegars:  Apple cider vinegar, 2 tbsp  Balsamic vinegar, 2 tbsp  Rice wine vinegar Wasabi  Worcestershire sauce Drinks Alcohol - is an irritant to the gut, limited intake advised:  Beer - limited to one drink  Clear spirits such as Vodka  Gin  Whiskey  Wine - limited to one drink Coffee:  Espresso coffee, regular or decaffeinated, black  Espresso coffee, regular or decaffeinated, with up to 219ml lactose free milk  Instant coffee, regular or decaffeinated, black  Instant coffee, regular or decaffeinated, with up to 281ml lactose free milk Drinking chocolate powder  Espresso, regular, black  Fruit juice, 18ml and safe fruits only  Lemonade - in low quantities  Malted chocolate powder e.g. Milo, Horlicks - 3 tsp  Protein supplement  Soya milk made with soy protein  Sugar free fizzy drinks / soft drinks / soda - such as diet coke, in low quantities as aspartame and acesulfame k can be  irritants  'Sugar' fizzy drinks / soft drinks / soda that do no contain HFCS such as lemonade, cola. Limit intake due to these drinks being generally unhealthy and can cause gut irritation  Tea:  Black tea, weak e.g. PG Tips  Chai tea, weak  Fruit and herbal tea, weak - ensure no apple added  Green tea  Peppermint tea  White tea Water

## 2015-08-09 NOTE — Progress Notes (Signed)
   Subjective:    Patient ID: Omar Sanders, male    DOB: 25-Dec-1940, 74 y.o.   MRN: [id number]  HPI  [place] interpreter [phone]  CC: follow up after results  # IBS / abdominal pain and bloating:  Had CT chest and abdomen since last visit to evaluate resolution of findings from CT scan in 2014, and eval for other etiology of symptoms. Both of these tests were negative for abdominal pathology, did show some coronary atherosclerosis and enlarged prostate  He continues to complain of bloating, abdominal pain  Taking amitiza, bentyl. He seems to pick and choose which medicines he wants to take for this issue. Has used stool softener in the past, unclear if he is taking now ROS: +diarrhea, +constipation, no nausea/vomiting Medications tried by prior PCP:  Linzness 145mg  (03/2014-04/2014) Dicyclomine 20mg  BID(09/2013, 05/2014-09/2014) Lubiprostone 1mcg BID (10/2013, 07/2014) Sorbitol 70% (01/2014)   # BPH  Taking flomax, initially was giving him some relief but now not working as well  As above, CT abdomen/pelvis showed enlarged prostate  Has trouble with starting stream ROS: no burning with urination  Social Hx: never smoker  Review of Systems   See HPI for ROS.   Past medical history, surgical, family, and social history reviewed and updated in the EMR as appropriate. Objective:  BP 127/70 mmHg  Pulse 70  Temp(Src) 97.7 F (36.5 C) (Oral)  Wt 182 lb (82.555 kg) Vitals and nursing note reviewed  General: NAD CV: RRR, normal s1s2, no murmurs/rub/gallop Resp: clear to auscultation bilaterally, normal effort Abdomen: soft, distended, tympanic to percussion, TTP lower quadrants, no organomegaly appreciated, normal bowel sounds Ext: trace edema bilaterally LE Neuro: alert and oriented, no deficits  Assessment & Plan:  Irritable bowel syndrome No improvement. Negative CT chest/abdomen. He has been on multiple medicines and managed by prior PCP and myself without much relief.  Did mention trying low FODMAP diet, however not hopeful this will help him much. At this point feel he may benefit from GI evaluation to see what else he can be offered; he is unable to get transportation to go to the [place] clinic, will refer to GI in [place]. Follow up after evaluation.  BPH (benign prostatic hyperplasia) Enlarged prostate on CT. Not getting much improvement with flomax anymore. Recommended he start finasteride as well, counseled regarding taking months to be effective. Return precautions for inability to void, dysuria, blood in urine. Follow up 2 months.

## 2015-08-10 NOTE — Assessment & Plan Note (Signed)
No improvement. Negative CT chest/abdomen. He has been on multiple medicines and managed by prior PCP and myself without much relief. Did mention trying low FODMAP diet, however not hopeful this will help him much. At this point feel he may benefit from GI evaluation to see what else he can be offered; he is unable to get transportation to go to the [place] clinic, will refer to GI in [place]. Follow up after evaluation.

## 2015-08-10 NOTE — Assessment & Plan Note (Addendum)
Enlarged prostate on CT. Not getting much improvement with flomax anymore. Recommended he start finasteride as well, counseled regarding taking months to be effective. Return precautions for inability to void, dysuria, blood in urine. Follow up 2 months.

## 2015-08-12 ENCOUNTER — Encounter: Payer: Self-pay | Admitting: Internal Medicine

## 2015-09-03 ENCOUNTER — Other Ambulatory Visit: Payer: Self-pay | Admitting: *Deleted

## 2015-09-06 MED ORDER — ALPRAZOLAM 0.5 MG PO TABS: 0.2500 mg | ORAL_TABLET | Freq: Every day | ORAL | Status: DC | PRN

## 2015-09-06 NOTE — Telephone Encounter (Signed)
Dr. Lamar Benes,  Do I need to call this in, or have you already done so? Nai Dasch, Salome Spotted, CMA

## 2015-09-08 NOTE — Telephone Encounter (Signed)
Left voice message on [facility] pharmacy line for the Xanax refill 0.5 mg.  Derl Barrow, RN

## 2015-10-11 ENCOUNTER — Ambulatory Visit: Payer: Medicare Other | Admitting: Internal Medicine

## 2015-10-20 ENCOUNTER — Telehealth: Payer: Self-pay

## 2015-10-20 NOTE — Telephone Encounter (Signed)
Erroneous encounter

## 2015-10-28 ENCOUNTER — Encounter: Payer: Self-pay | Admitting: Internal Medicine

## 2015-10-28 ENCOUNTER — Ambulatory Visit (INDEPENDENT_AMBULATORY_CARE_PROVIDER_SITE_OTHER): Payer: Medicare Other | Admitting: Internal Medicine

## 2015-10-28 VITALS — BP 108/60 | HR 64 | Ht 66.0 in | Wt 179.4 lb

## 2015-10-28 DIAGNOSIS — K219 Gastro-esophageal reflux disease without esophagitis: Secondary | ICD-10-CM

## 2015-10-28 DIAGNOSIS — K5901 Slow transit constipation: Secondary | ICD-10-CM | POA: Diagnosis not present

## 2015-10-28 DIAGNOSIS — Z86010 Personal history of colonic polyps: Secondary | ICD-10-CM

## 2015-10-28 DIAGNOSIS — K589 Irritable bowel syndrome without diarrhea: Secondary | ICD-10-CM

## 2015-10-28 DIAGNOSIS — R140 Abdominal distension (gaseous): Secondary | ICD-10-CM

## 2015-10-28 NOTE — Patient Instructions (Signed)
Please follow up as needed 

## 2015-10-28 NOTE — Progress Notes (Signed)
HISTORY OF PRESENT ILLNESS:  Omar Sanders is a 74 y.o. male [place] refugee with a history of hypertension, dyslipidemia, GERD, and chronic functional abdominal complaints who is sent today by the [facility] family medicine clinic, Dr. Eloy End, regarding chronic abdominal complaints and constipation. The patient does not speak English and is accompanied by a hired Optometrist. I saw the patient on one occasion 05/19/2010. At that time he presented with chronic abdominal complaints that were felt to be in part components of GERD and irritable bowel syndrome with functional abdominal pain dominating the picture. She does have a history of adenomatous colon polyps and has been in a colonoscopy surveillance program with another gastroenterologist in [place], Dr. Collene Mares. Not clear why he was sent back to this clinic given that fact. Outside office notes, x-rays, and laboratories have been personally reviewed. Patient did undergo colonoscopy in 2008 with an adenomatous colon polyp being removed. He has undergone multiple imaging studies for his chronic abdominal pain. Most recently CT scan of the chest abdomen and pelvis 07/30/2015 without acute findings. He is status post cholecystectomy. Upper endoscopy performed 06/20/2010 was normal. He does have a history of anxiety. His most recent colonoscopy with Dr. Collene Mares was performed April 2012. Right colon polyp was removed (no pathology available for review). Dr. Collene Mares recommends continued surveillance. The patient's principal complaint today is that of chronic constipation associated with abdominal bloating. He is concerned that he has fluid in his abdomen. He has tried multiple agents for his bowels. He does take senna which may or may not work. He has used Amitiza which does result in bowel movements, though diarrhea. No problems with vomiting, weight loss, or bleeding.  REVIEW OF SYSTEMS:  All non-GI ROS negative except for sinus allergy trouble, anxiety, back  pain, visual change, confusion, fatigue, cough, headaches, noise in his ears, irregular heart rate, itching, night sweats, muscle cramps, sleeping problems, pain and numbness in his feet, urinary frequency, pain with urination, voice change, scratchy throat  Past Medical History  Diagnosis Date  . GERD (gastroesophageal reflux disease)   . Hypertension   . Headache(784.0)   . Neuromuscular disorder ([place])     periferal neuropathy per patient  . OSA (obstructive sleep apnea) 11/2009    sleep study with mild OSA, pt unwilling to use CPAP  . Anxiety   . Incontinent of urine     Hx:of  . Hyperlipidemia     Hx: of  . Allergy   . Anemia   . Chronic pain   . Migraines   . Benign prostatic hyperplasia   . IBS (irritable bowel syndrome)   . Hemorrhoids   . Colon polyp   . Anal fissure   . Seizures [facility])     Past Surgical History  Procedure Laterality Date  . Cardiac catheterization  06/04/2009    mild to moderate CAD  w/o hemodynamic significant lesion  . Colonoscopy  2012    Dr Collene Mares  . Insertion of mesh Bilateral 03/11/2013    Procedure: INSERTION OF MESH;  Surgeon: Ralene Ok, MD;  Location: [place];  Service: General;  Laterality: Bilateral;  . Inguinal hernia repair Bilateral 03/11/2013    Procedure: LAPAROSCOPIC BILATERAL INGUINAL HERNIA REPAIR;  Surgeon: Ralene Ok, MD;  Location: [place];  Service: General;  Laterality: Bilateral;  . Cholecystectomy  2008  . US echocardiography  05/27/2009    mild mitral annular ca+,AOV mildly sclerotic  . Nm myocar perf wall motion  05/27/2009    mild iscemia  mid inferio & apical regions.  . Nm myocar perf wall motion  11/01/2012    negative for ischemia  . Hemorrhoid surgery      anal surgery    Social History Omar Sanders  reports that he has never smoked. He has never used smokeless tobacco. He reports that he does not drink alcohol or use illicit drugs.  family history is not on file.  Allergies  Allergen Reactions  . Aspirin      Unknown  . Vitamin C     Unknown       PHYSICAL EXAMINATION: Vital signs: BP 108/60 mmHg  Pulse 64  Ht 5\' 6"  (1.676 m)  Wt 179 lb 6 oz (81.364 kg)  BMI 28.97 kg/m2  Constitutional: generally well-appearing, no acute distress Psychiatric: alert and oriented x3, cooperative Eyes: extraocular movements intact, anicteric, conjunctiva pink Mouth: oral pharynx moist, no lesions Neck: supple without thyromegaly Lymph: no lymphadenopathy Cardiovascular: heart regular rate and rhythm, no murmur Lungs: clear to auscultation bilaterally Abdomen: soft, nontender, nondistended, no obvious ascites, no peritoneal signs, normal bowel sounds, no organomegaly Rectal: Ommitted Extremities: no clubbing cyanosis or edema lower extremity edema bilaterally Skin: no lesions on visible extremities Neuro: No focal deficits. Cranial nerves intact. Normal deep tendon reflexes  ASSESSMENT:  #1. Constipation predominant IBS. Long-standing chronic symptoms of functional abdominal discomfort ongoing. #2. GERD. No classic symptoms on omeprazole 20 mg daily #3. History of adenomatous colon polyps. Previous colonoscopies with Dr. Collene Mares 2008 and 2012. In surveillance program with her currently  PLAN:  #1. Try to discuss with the patient irritable bowel syndrome and constipation. He has a hard time comprehending functional conditions and is concerned about significant organic problems. Literature provided for his review on IBS and chronic abdominal pain. He will need interpreter to work through this. #2. In terms of bowel regimen, I recommended that he use his Senokot on a regular basis as this can be helpful. He can then use Amitiza as rescue therapy since this certainly works and makes his abdomen feel better. #3. Continue PPI for control of GERD #4. Continue colonoscopy surveillance with Dr. Collene Mares #5. The patient should have continuity GI care with one GI physician. This should be Dr. Collene Mares as he has been  under care for colonoscopy surveillance and abdominal complaints. He will return to the care of his primary care physician at the outpatient clinic  45 minutes was spent face-to-face with the patient. 50% of the time used for counseling. A bit laborious due to language barrier and the need for interpreter assistance

## 2015-11-02 ENCOUNTER — Ambulatory Visit (INDEPENDENT_AMBULATORY_CARE_PROVIDER_SITE_OTHER): Payer: Medicare Other | Admitting: Family Medicine

## 2015-11-02 VITALS — BP 146/70 | HR 76 | Temp 97.7°F | Wt 177.0 lb

## 2015-11-02 DIAGNOSIS — R0600 Dyspnea, unspecified: Secondary | ICD-10-CM | POA: Diagnosis not present

## 2015-11-02 DIAGNOSIS — N400 Enlarged prostate without lower urinary tract symptoms: Secondary | ICD-10-CM

## 2015-11-02 DIAGNOSIS — I10 Essential (primary) hypertension: Secondary | ICD-10-CM | POA: Diagnosis not present

## 2015-11-02 DIAGNOSIS — F411 Generalized anxiety disorder: Secondary | ICD-10-CM | POA: Diagnosis not present

## 2015-11-02 MED ORDER — ALPRAZOLAM 0.5 MG PO TABS: 0.2500 mg | ORAL_TABLET | Freq: Every day | ORAL | Status: DC | PRN

## 2015-11-02 MED ORDER — TAMSULOSIN HCL 0.4 MG PO CAPS: 0.8000 mg | ORAL_CAPSULE | Freq: Every day | ORAL | Status: DC

## 2015-11-02 NOTE — Patient Instructions (Signed)
Increase the Tamsulosin to 2 tablets once a day.

## 2015-11-02 NOTE — Progress Notes (Signed)
   Subjective:    Patient ID: Omar Sanders, male    DOB: [birth year], 75 y.o.   MRN: [id number]  HPI  [place] phone interpreter used  CC: medicine refill  # BPH:  on tamsulosin and finasteride  Does not feel tamsulosin is helpful. Has been making sure to take this every day for past 2 weeks, no missed doses  Symptoms: hard to start urination, sometimes gets pain.   During daytime 4-5 times voiding. During night 3-4 times. ROS: no dysuria  # Anxiety  Xanax - taking 1 tablet daily. Has not tried to take 1/2 tablet yet  Describes pain in lower abdomen, pain in ears, headache when he does not take the medicine  # Hypertension  Took lisinopril, no missed doses ROS: Denies current CP, SOB  # Short of breath at night  Complains of SOB when laying down  Happens 2-3 nights a week. Tries to go to sleep  Feels like heavy breathing right now  Does not notice worse when legs are swollen  Social Hx: never smoker  Review of Systems   See HPI for ROS.   Past medical history, surgical, family, and social history reviewed and updated in the EMR as appropriate. Objective:  BP 161/80 mmHg  Pulse 76  Temp(Src) 97.7 F (36.5 C) (Oral)  Wt 177 lb (80.287 kg) Vitals and nursing note reviewed Repeat BP BP 146/70 mmHg  General: NAD CV: RRR, normal heart sounds, no murmurs, rubs or gallop.  Resp: clear to auscultation bilaterally, normal effort Abdomen: normal bowel sounds Extremities: trace LE edema on the left, no edema on the right  Assessment & Plan:  BPH (benign prostatic hyperplasia) No improvement on finasteride (this will be month 4) and tamsulosin. Has been consistently taking tamsulosin for past several weeks (he reports) so will double to take 0.8mg  daily. If not getting much improvement in several months will refer to urology. (reviewed CT scan again and has fairly significant prostatic enlargement).   Anxiety state On 0.5mg  xanax daily currently. Instructed to  start trying to take 1/2 tablet. Rx sent for refill #20 x 2 months.   Essential hypertension Not at goal today, however improved with second reading. Flowsheets reviewed and he has been very well controlled in the past. Will continue lisinopril on current dose 20mg  and follow up at next visit.  Dyspnea New complaint to me. He brings this up last, and does not seem to make it out as a very concerning symptom. By his report it sounds most like he may be having little panic type symptoms. He has no prior diagnosis of COPD or CHF (last echo 2010 was normal). Reviewed CT chest from September and this appears largely normal, has a stable lung nodule and scarring at the bases but otherwise okay. Discussed continued monitoring, follow up if worsening.

## 2015-11-02 NOTE — Assessment & Plan Note (Signed)
On 0.5mg  xanax daily currently. Instructed to start trying to take 1/2 tablet. Rx sent for refill #20 x 2 months.

## 2015-11-02 NOTE — Assessment & Plan Note (Signed)
Not at goal today, however improved with second reading. Flowsheets reviewed and he has been very well controlled in the past. Will continue lisinopril on current dose 20mg  and follow up at next visit.

## 2015-11-02 NOTE — Assessment & Plan Note (Signed)
No improvement on finasteride (this will be month 4) and tamsulosin. Has been consistently taking tamsulosin for past several weeks (he reports) so will double to take 0.8mg  daily. If not getting much improvement in several months will refer to urology. (reviewed CT scan again and has fairly significant prostatic enlargement).

## 2015-11-02 NOTE — Assessment & Plan Note (Signed)
New complaint to me. He brings this up last, and does not seem to make it out as a very concerning symptom. By his report it sounds most like he may be having little panic type symptoms. He has no prior diagnosis of COPD or CHF (last echo 2010 was normal). Reviewed CT chest from September and this appears largely normal, has a stable lung nodule and scarring at the bases but otherwise okay. Discussed continued monitoring, follow up if worsening.

## 2015-12-21 ENCOUNTER — Telehealth: Payer: Self-pay | Admitting: *Deleted

## 2015-12-21 NOTE — Telephone Encounter (Signed)
Called patient to offer flu vaccine. Patient states he received flu vaccine at [facility] October 2016 Called [facility] to verify date and was told last flu vaccine was given October 2015 Scheduled patient to come in tomorrow at 0945 to receive flu vaccine. Velora Heckler, RN

## 2015-12-22 ENCOUNTER — Ambulatory Visit: Payer: Medicare Other

## 2016-01-03 ENCOUNTER — Ambulatory Visit: Payer: Medicare Other | Admitting: Family Medicine

## 2016-01-18 ENCOUNTER — Encounter: Payer: Self-pay | Admitting: Family Medicine

## 2016-01-18 ENCOUNTER — Ambulatory Visit (INDEPENDENT_AMBULATORY_CARE_PROVIDER_SITE_OTHER): Payer: Medicare Other | Admitting: Family Medicine

## 2016-01-18 VITALS — BP 108/61 | HR 68 | Temp 97.8°F | Ht 67.0 in | Wt 178.3 lb

## 2016-01-18 DIAGNOSIS — I10 Essential (primary) hypertension: Secondary | ICD-10-CM

## 2016-01-18 DIAGNOSIS — N183 Chronic kidney disease, stage 3 unspecified: Secondary | ICD-10-CM

## 2016-01-18 DIAGNOSIS — M62830 Muscle spasm of back: Secondary | ICD-10-CM | POA: Diagnosis not present

## 2016-01-18 DIAGNOSIS — N182 Chronic kidney disease, stage 2 (mild): Secondary | ICD-10-CM | POA: Diagnosis not present

## 2016-01-18 DIAGNOSIS — E785 Hyperlipidemia, unspecified: Secondary | ICD-10-CM | POA: Diagnosis not present

## 2016-01-18 DIAGNOSIS — R300 Dysuria: Secondary | ICD-10-CM | POA: Diagnosis not present

## 2016-01-18 DIAGNOSIS — N400 Enlarged prostate without lower urinary tract symptoms: Secondary | ICD-10-CM

## 2016-01-18 LAB — POCT URINALYSIS DIPSTICK
Bilirubin, UA: NEGATIVE
Blood, UA: NEGATIVE
Glucose, UA: NEGATIVE
Ketones, UA: NEGATIVE
Leukocytes, UA: NEGATIVE
Nitrite, UA: NEGATIVE
Protein, UA: NEGATIVE
Spec Grav, UA: <=1.005
Urobilinogen, UA: 0.2
pH, UA: 6

## 2016-01-18 LAB — BASIC METABOLIC PANEL WITH GFR
BUN: 19 mg/dL (ref 7–25)
CO2: 24 mmol/L (ref 20–31)
Calcium: 9.1 mg/dL (ref 8.6–10.3)
Chloride: 106 mmol/L (ref 98–110)
Creat: 1.46 mg/dL — ABNORMAL HIGH (ref 0.70–1.18)
GFR, Est African American: 53 mL/min — ABNORMAL LOW (ref >=60)
GFR, Est Non African American: 46 mL/min — ABNORMAL LOW (ref >=60)
Glucose, Bld: 81 mg/dL (ref 65–99)
Potassium: 4.1 mmol/L (ref 3.5–5.3)
Sodium: 135 mmol/L (ref 135–146)

## 2016-01-18 MED ORDER — FINASTERIDE 5 MG PO TABS: 5.0000 mg | ORAL_TABLET | Freq: Every day | ORAL | Status: DC

## 2016-01-18 MED ORDER — LISINOPRIL 20 MG PO TABS: 20.0000 mg | ORAL_TABLET | Freq: Every day | ORAL | Status: DC

## 2016-01-18 MED ORDER — BACLOFEN 10 MG PO TABS: 10.0000 mg | ORAL_TABLET | Freq: Every evening | ORAL | Status: DC | PRN

## 2016-01-18 MED ORDER — LUBIPROSTONE 24 MCG PO CAPS: 24.0000 ug | ORAL_CAPSULE | Freq: Two times a day (BID) | ORAL | Status: DC

## 2016-01-18 MED ORDER — ALPRAZOLAM 0.5 MG PO TABS: 0.2500 mg | ORAL_TABLET | Freq: Every day | ORAL | Status: DC | PRN

## 2016-01-18 MED ORDER — PANTOPRAZOLE SODIUM 20 MG PO TBEC
20.0000 mg | DELAYED_RELEASE_TABLET | Freq: Every day | ORAL | Status: DC
Start: 2016-01-18 — End: 2016-04-28

## 2016-01-18 NOTE — Progress Notes (Signed)
   Subjective:    Patient ID: Omar Sanders, male    DOB: [birth year], 75 y.o.   MRN: [id number]  HPI  [employer] interpreter [phone] [place]  CC: back pain  # Back pain, belly pain:  Yes, similar to the past  Lower back  Pain is on and off. May last up to 2 weeks and then go away. Current pain started about 3-4 days ago.  Pain is worse when lying down and also standing up  He cannot think of anything that may have brought the episode on  Has had intramuscular injections in the past, didn't think they helped  Uses heat/ice and makes it better ROS: no hematuria, still having issues with voiding. Having abdominal pain with urination, doesn't burn.   # Hypertension  Does not check BP at home  He does feel dizzy.   Does report a few falls from being "tired" -- last year.  ROS: no CP, no current HA, no SOB  Social Hx: never smoker  Review of Systems   See HPI for ROS.   Past medical history, surgical, family, and social history reviewed and updated in the EMR as appropriate. Objective:  BP 108/61 mmHg  Pulse 68  Temp(Src) 97.8 F (36.6 C) (Oral)  Ht 5\' 7"  (1.702 m)  Wt 178 lb 4.8 oz (80.876 kg)  BMI 27.92 kg/m2 Vitals and nursing note reviewed  General: no apparent distress while sitting, upon trying to stand he does appear to have moderate difficulty CV: normal rate, regular rhythm, no murmurs, rubs or gallop  Resp: clear to auscultation bilaterally, normal effort Back: no CVA tenderness. He has tenderness to palpation bilaterally at the level of the iliac crest, no spinal deformity noted. There is some paraspinal tenderness and spasm. ROM is limited by pain Abdomen: soft, tender to palpation (mild) over suprapubic area, no rebound or guarding, normal bowel sounds.  Assessment & Plan:  Essential hypertension BP low today, with reports of dizziness. Will discontinue flomax. BMP collected was stable. Follow up 1 month.  BPH (benign prostatic hyperplasia) Discontinue  flomax due to orthostasis/low BP. UA normal today despite his complaints of pain with voiding.   Back muscle spasm Chronic, intermittent episodes. Notable spasm today in clinic and inability to bend due to pain. No focal neurological symptoms. Will do a trial of baclofen, educated that he is okay to take tylenol. Continue back exercises. If not improving will consider PT referral. Follow up 1 month.

## 2016-01-18 NOTE — Patient Instructions (Addendum)
Back pain:  Continue stretching and exercises Take the Baclofen 1 tablet at night to help relax the muscles. Take Tylenol up to 650mg  every 6 hours as needed -- THIS DOES NOT HURT THE KIDNEY   STOP the FLOMAX/TAMSULOSIN  Return in about 1 month

## 2016-01-19 DIAGNOSIS — N184 Chronic kidney disease, stage 4 (severe): Secondary | ICD-10-CM | POA: Insufficient documentation

## 2016-01-19 DIAGNOSIS — N189 Chronic kidney disease, unspecified: Secondary | ICD-10-CM | POA: Insufficient documentation

## 2016-01-19 DIAGNOSIS — N1831 Chronic kidney disease, stage 3a: Secondary | ICD-10-CM | POA: Insufficient documentation

## 2016-01-19 HISTORY — DX: Chronic kidney disease, stage 3a: N18.31

## 2016-01-19 NOTE — Assessment & Plan Note (Signed)
Discontinue flomax due to orthostasis/low BP. UA normal today despite his complaints of pain with voiding.

## 2016-01-19 NOTE — Assessment & Plan Note (Addendum)
BP low today, with reports of dizziness. Will discontinue flomax. BMP collected was stable. Follow up 1 month.

## 2016-01-19 NOTE — Assessment & Plan Note (Signed)
Chronic, intermittent episodes. Notable spasm today in clinic and inability to bend due to pain. No focal neurological symptoms. Will do a trial of baclofen, educated that he is okay to take tylenol. Continue back exercises. If not improving will consider PT referral. Follow up 1 month.

## 2016-02-14 ENCOUNTER — Telehealth: Payer: Self-pay | Admitting: Family Medicine

## 2016-02-14 ENCOUNTER — Ambulatory Visit (INDEPENDENT_AMBULATORY_CARE_PROVIDER_SITE_OTHER): Payer: Medicare Other | Admitting: Family Medicine

## 2016-02-14 VITALS — BP 100/80 | HR 63 | Temp 97.9°F | Ht 67.0 in | Wt 177.6 lb

## 2016-02-14 DIAGNOSIS — D649 Anemia, unspecified: Secondary | ICD-10-CM | POA: Diagnosis not present

## 2016-02-14 DIAGNOSIS — M5442 Lumbago with sciatica, left side: Secondary | ICD-10-CM | POA: Diagnosis not present

## 2016-02-14 DIAGNOSIS — R42 Dizziness and giddiness: Secondary | ICD-10-CM

## 2016-02-14 LAB — CBC
HCT: 37.5 % — ABNORMAL LOW (ref 38.5–50.0)
Hemoglobin: 12.1 g/dL — ABNORMAL LOW (ref 13.2–17.1)
MCH: 26.4 pg — ABNORMAL LOW (ref 27.0–33.0)
MCHC: 32.3 g/dL (ref 32.0–36.0)
MCV: 81.9 fL (ref 80.0–100.0)
MPV: 9.6 fL (ref 7.5–12.5)
Platelets: 218 10*3/uL (ref 140–400)
RBC: 4.58 MIL/uL (ref 4.20–5.80)
RDW: 15.7 % — ABNORMAL HIGH (ref 11.0–15.0)
WBC: 5.7 10*3/uL (ref 3.8–10.8)

## 2016-02-14 MED ORDER — LIDOCAINE 5 % EX PTCH: 1.0000 | MEDICATED_PATCH | CUTANEOUS | Status: DC

## 2016-02-14 NOTE — Progress Notes (Signed)
   Subjective:    Patient ID: Omar Sanders, male    DOB: 1941-05-06, 75 y.o.   MRN: [id number]  HPI  CC: follow up back pain  # Back pain:  Still present  Seems to travel down into both legs as well, down to foot, but primarily on the left. Feels like aching and sharp pain.   Has had the radiating pain a bit, but seems to be worse in the past 2 weeks  Feels some weakness in the left leg  Taking icy hot, helps a little. Baclofen made his stomach upset. Taking tylenol, does not help much.  Denies any saddle anesthesia, bowel/bladder incontinence, fevers. Does endorse some numbness of his LE.  ROS: no fevers, chills, has chronic constipation from IBS  # Light headed  Still feeling dizziness and lightheadedness.  He has stopped the flomax. He is still on lisinopril.  ROS: no syncope, no changes in vision  Social Hx: never smoker  Review of Systems   See HPI for ROS.   Past medical history, surgical, family, and social history reviewed and updated in the EMR as appropriate. Objective:  BP 100/80 mmHg  Pulse 63  Temp(Src) 97.9 F (36.6 C) (Oral)  Ht 5\' 7"  (1.702 m)  Wt 177 lb 9.6 oz (80.559 kg)  BMI 27.81 kg/m2 Vitals and nursing note reviewed  General: no apparent distress  Back: no spinal deformity palpated, he has tenderness around L4/L5 and some tenderness on his left low back near PSIS Neuro: alert/oriented. He is able to get onto the exam table on his own. Strength testing is overall normal in leg extension/flexion, foot extension/flexion, there is maybe a slight decrease on the left but due to pain. Sensation to light touch is intact and same on both feet. Reflexes are 2-3+ patellar on both sides. SLR testing positive bilaterally, worse on the left.   Assessment & Plan:  Lightheadedness 2nd office visit with BP <110 SBP. He continues to complain of lightheadedness despite stopping flomax. At this point we will also stop his remaining BP medicine, lisinopril. Will  check CBC today as well (he has a history of anemia). Follow up 2 weeks.  Low back pain with left-sided sciatica Seems to be worsening. He does not have any major red flags or neurological signs, and this does seem to be more of a chronic issue since his prior PCP had been treating radicular leg pain. Patient requesting fentanyl patch however I discussed with him since his BP is low it is not recommended. We will do another trial of lidoderm patch (he has been on this in the past with prior PCP). Refer to PT. If this continues to be persistent may consider MRI (he has a prior X-ray of Lumbar spine in 2011, and several CT abdomen/pelvis that did not show any pathology for the visualized portions of spine).

## 2016-02-14 NOTE — Assessment & Plan Note (Signed)
Seems to be worsening. He does not have any major red flags or neurological signs, and this does seem to be more of a chronic issue since his prior PCP had been treating radicular leg pain. Patient requesting fentanyl patch however I discussed with him since his BP is low it is not recommended. We will do another trial of lidoderm patch (he has been on this in the past with prior PCP). Refer to PT. If this continues to be persistent may consider MRI (he has a prior X-ray of Lumbar spine in 2011, and several CT abdomen/pelvis that did not show any pathology for the visualized portions of spine).

## 2016-02-14 NOTE — Telephone Encounter (Signed)
Medicare will not cover the patch prescribed today.  Please let the pt know if something else can be prescribed and if so, when he can pick it up from the pharmacy

## 2016-02-14 NOTE — Patient Instructions (Addendum)
STOP the LISINOPRIL (blood pressure medicine)  If your blood pressure gets higher than 150/100, you can take the lisinopril again.   LIDODERM PATCH: put this on the area of most pain on your back. Change it every 12 hours as needed.   Come back in 2 weeks to check your blood pressure

## 2016-02-14 NOTE — Assessment & Plan Note (Signed)
2nd office visit with BP <110 SBP. He continues to complain of lightheadedness despite stopping flomax. At this point we will also stop his remaining BP medicine, lisinopril. Will check CBC today as well (he has a history of anemia). Follow up 2 weeks.

## 2016-02-16 ENCOUNTER — Telehealth: Payer: Self-pay | Admitting: Family Medicine

## 2016-02-16 NOTE — Telephone Encounter (Signed)
Lidocaine patches not covered by pt's insurance. Pt requesting RX that is covered. Pls advise.

## 2016-02-16 NOTE — Telephone Encounter (Signed)
Will forward to MD and triage to see if a PA was received for this medication. Yidel Teuscher,CMA

## 2016-02-16 NOTE — Telephone Encounter (Signed)
Prior Authorization received from [facility] pharmacy for Lidocaine 5% patch.  PA form placed in provider box for completion. Nakyia Dau L, RN  

## 2016-02-16 NOTE — Telephone Encounter (Signed)
PA form already placed in provider box for review. Derl Barrow, RN

## 2016-02-17 NOTE — Telephone Encounter (Signed)
PA form faxed to [employer] for review. Martin, Tamika L, RN  

## 2016-02-17 NOTE — Telephone Encounter (Signed)
Filled out and ready to be faxed.

## 2016-02-18 NOTE — Telephone Encounter (Signed)
PA for Lidocaine was denied via [employer]. Reference number: [id number].  Derl Barrow, RN

## 2016-02-23 ENCOUNTER — Ambulatory Visit: Payer: Medicare Other | Attending: Family Medicine

## 2016-02-23 ENCOUNTER — Ambulatory Visit: Payer: Medicare Other | Admitting: Physical Therapy

## 2016-02-23 DIAGNOSIS — R293 Abnormal posture: Secondary | ICD-10-CM | POA: Insufficient documentation

## 2016-02-23 DIAGNOSIS — M5442 Lumbago with sciatica, left side: Secondary | ICD-10-CM | POA: Diagnosis not present

## 2016-02-23 DIAGNOSIS — R252 Cramp and spasm: Secondary | ICD-10-CM | POA: Diagnosis not present

## 2016-02-23 NOTE — Patient Instructions (Signed)
Issued from cabinet [employer] standing lateral glides at wall with hips going toward the wall. 3-5 reps 5-6x/day 1-5 sec hold.

## 2016-02-23 NOTE — Therapy (Signed)
[place], [place], [address] Phone: [phone]   Fax:  [phone]  Physical Therapy Evaluation  Patient Details  Name: Omar Sanders MRN: [id number] Date of Birth: June 02, 1941 Referring Provider: Tawanna Sat, MD  Encounter Date: 02/23/2016      PT End of Session - 02/23/16 1224    Visit Number 1   Number of Visits 8   Date for PT Re-Evaluation 03/24/16   Authorization Type Medicare UHC   PT Start Time 1100   PT Stop Time 1203   PT Time Calculation (min) 63 min   Activity Tolerance Patient tolerated treatment well;No increased pain   Behavior During Therapy [facility] for tasks assessed/performed      Past Medical History  Diagnosis Date  . GERD (gastroesophageal reflux disease)   . Hypertension   . Headache(784.0)   . Neuromuscular disorder ([place])     periferal neuropathy per patient  . OSA (obstructive sleep apnea) 11/2009    sleep study with mild OSA, pt unwilling to use CPAP  . Anxiety   . Incontinent of urine     Hx:of  . Hyperlipidemia     Hx: of  . Allergy   . Anemia   . Chronic pain   . Migraines   . Benign prostatic hyperplasia   . IBS (irritable bowel syndrome)   . Hemorrhoids   . Colon polyp   . Anal fissure   . Seizures [facility])     Past Surgical History  Procedure Laterality Date  . Cardiac catheterization  06/04/2009    mild to moderate CAD  w/o hemodynamic significant lesion  . Colonoscopy  2012    Dr Collene Mares  . Insertion of mesh Bilateral 03/11/2013    Procedure: INSERTION OF MESH;  Surgeon: Ralene Ok, MD;  Location: [place];  Service: General;  Laterality: Bilateral;  . Inguinal hernia repair Bilateral 03/11/2013    Procedure: LAPAROSCOPIC BILATERAL INGUINAL HERNIA REPAIR;  Surgeon: Ralene Ok, MD;  Location: [place];  Service: General;  Laterality: Bilateral;  . Cholecystectomy  2008  . US echocardiography  05/27/2009    mild mitral annular ca+,AOV mildly sclerotic  . Nm  myocar perf wall motion  05/27/2009    mild iscemia mid inferio & apical regions.  . Nm myocar perf wall motion  11/01/2012    negative for ischemia  . Hemorrhoid surgery      anal surgery    There were no vitals filed for this visit.       Subjective Assessment - 02/23/16 1130    Subjective Onset of back pain over 2 months without injury.   MD issued medication. He has had back problems in past.   Prior to onset of this pain .     Patient is accompained by: Interpreter   Pertinent History Chronic back pain with dgenerative changes in lower back.    Limitations Walking;Standing;Sitting   Diagnostic tests No testing  recently   Patient Stated Goals Ease the pain    Currently in Pain? Yes   Pain Score 8    Pain Location Back   Pain Orientation Posterior;Left;Lower   Pain Descriptors / Indicators Sharp;Numbness   Pain Type Chronic pain;Acute pain   Pain Radiating Towards LT hip and leg   Pain Onset More than a month ago   Pain Frequency Constant   Aggravating Factors  See above   Pain Relieving Factors patches for pain relief , medication does not help.  Multiple Pain Sites No            [facility] PT Assessment - 02/23/16 0001    Assessment   Referring Provider Tawanna Sat, MD   Prior Therapy Never   Precautions   Precautions None   Restrictions   Weight Bearing Restrictions No   Balance Screen   Has the patient fallen in the past 6 months Yes   How many times? 2  walking , tripped on concrete   Has the patient had a decrease in activity level because of a fear of falling?  Yes   Is the patient reluctant to leave their home because of a fear of falling?  No   Home Environment   Living Environment Private residence   Living Arrangements Children   Type of [place] to enter   Entrance Stairs-Number of Steps 2  difficult due to pain in legs   Entrance Stairs-Rails None;Right   Prior Function   Level of Independence Requires assistive device  for independence   Cognition   Overall Cognitive Status Within Functional Limits for tasks assessed   Posture/Postural Control   Posture Comments hips shifted to LT   ROM / Strength   AROM / PROM / Strength AROM;Strength   AROM   AROM Assessment Site Lumbar   Lumbar Flexion 30   Lumbar Extension 30   Lumbar - Right Side Bend 15   Lumbar - Left Side Bend 20   Ambulation/Gait   Gait Comments walking with SPC decreased weight to LT leg                           PT Education - 02/23/16 1224    Education provided Yes   Education Details POC, HEP   Person(s) Educated Patient;Other (comment)   Methods Explanation;Demonstration;Tactile cues;Verbal cues;Handout   Comprehension Returned demonstration;Verbalized understanding             PT Long Term Goals - 02/23/16 1233    PT LONG TERM GOAL #1   Title He will be independent with all HEP issued    Time 4   Period Weeks   Status New   PT LONG TERM GOAL #2   Title He will report pain decreased 50% or more and be itermittant.    Time 4   Period Weeks   Status New   PT LONG TERM GOAL #3   Title He will be able to walk without device in and out of home.    Time 4   Period Weeks   Status New   PT LONG TERM GOAL #4   Title He will be able to perform transitional movements of sit to stand and supine to sit with 1-2/10 pain max   Time 4   Period Weeks   Status New   PT LONG TERM GOAL #5   Title His FOTO score witll decreased < 50% limited to demo functional improvement   Time 4   Period Weeks   Status New               Plan - 02/23/16 1225    Clinical Impression Statement Omar Sanders presents with complaint of pain in LT lower back and down into LT hip and lower leg. All activity causes pain. Pressure to back and hip sometimes eases pain.Marland Kitchen  His medication is not helpful and I asked him to call MD to report this. He reports  he is independent at home then asked to have someone to come to his home to  help him as his family is working and cannot help . He has transportation issues and we tried to help him with some options to get asssistance.   Language barrier limited actual eval process as the intake info took until 1140 to finish   Rehab Potential Fair   PT Frequency 2x / week   PT Duration 4 weeks   PT Treatment/Interventions Electrical Stimulation;Moist Heat;Traction;Therapeutic activities;Therapeutic exercise;Manual techniques;Taping;Dry needling;Passive range of motion;Patient/family education   PT Next Visit Plan Stretching, assess strength, STW and mobs, possible tape, modalities as needed   PT Home Exercise Plan lateral shifting   Consulted and Agree with Plan of Care Patient      Patient will benefit from skilled therapeutic intervention in order to improve the following deficits and impairments:  Decreased activity tolerance, Pain, Increased muscle spasms, Decreased range of motion, Postural dysfunction  Visit Diagnosis: Bilateral low back pain with left-sided sciatica - Plan: PT plan of care cert/re-cert  Abnormal posture - Plan: PT plan of care cert/re-cert  Cramp and spasm - Plan: PT plan of care cert/re-cert      G-Codes - [id number] 1236-02-29    Functional Assessment Tool Used FOTO  90% limited   Functional Limitation Other PT primary   Other PT Primary Current Status [id number]) At least 80 percent but less than 100 percent impaired, limited or restricted   Other PT Primary Goal Status [id number]) At least 40 percent but less than 60 percent impaired, limited or restricted       Problem List Patient Active Problem List   Diagnosis Date Noted  . Lightheadedness 02/14/2016  . Chronic kidney disease 01/19/2016  . Dyspnea 11/02/2015  . BPH (benign prostatic hyperplasia) 05/06/2015  . Low back pain with left-sided sciatica 03/15/2015  . Leg swelling 02/01/2015  . Anxiety state 12/29/2014  . Leg pain, bilateral 11/18/2014  . Headache 11/18/2014  . Healthcare maintenance  10/01/2014  . Anemia 10/31/2012  . GERD 05/19/2010  . Irritable bowel syndrome 05/19/2010  . PERSONAL HX COLONIC POLYPS 05/19/2010  . DYSLIPIDEMIA 04/10/2007  . Essential hypertension 04/10/2007  . HIATAL HERNIA 04/10/2007    Omar Sanders   PT 02/23/2016, 12:46 PM  [place] [facility] [address] [place], [place], [address] Phone: [phone]   Fax:  [phone]  Name: Omar Sanders MRN: [id number] Date of Birth: 1941-08-21

## 2016-02-29 ENCOUNTER — Encounter: Payer: Medicare Other | Admitting: Physical Therapy

## 2016-03-01 ENCOUNTER — Ambulatory Visit (HOSPITAL_COMMUNITY)
Admission: RE | Admit: 2016-03-01 | Discharge: 2016-03-01 | Disposition: A | Discharge status: Home / Self Care | Payer: Medicare Other | Source: Ambulatory Visit | Attending: Family Medicine | Admitting: Family Medicine

## 2016-03-01 ENCOUNTER — Ambulatory Visit (INDEPENDENT_AMBULATORY_CARE_PROVIDER_SITE_OTHER): Payer: Medicare Other | Admitting: Family Medicine

## 2016-03-01 VITALS — BP 143/79 | HR 95 | Temp 97.6°F | Wt 177.7 lb

## 2016-03-01 DIAGNOSIS — M47896 Other spondylosis, lumbar region: Secondary | ICD-10-CM | POA: Insufficient documentation

## 2016-03-01 DIAGNOSIS — I708 Atherosclerosis of other arteries: Secondary | ICD-10-CM | POA: Insufficient documentation

## 2016-03-01 DIAGNOSIS — M545 Low back pain: Secondary | ICD-10-CM | POA: Diagnosis present

## 2016-03-01 DIAGNOSIS — I700 Atherosclerosis of aorta: Secondary | ICD-10-CM | POA: Diagnosis not present

## 2016-03-01 DIAGNOSIS — M5442 Lumbago with sciatica, left side: Secondary | ICD-10-CM | POA: Diagnosis not present

## 2016-03-01 DIAGNOSIS — M47816 Spondylosis without myelopathy or radiculopathy, lumbar region: Secondary | ICD-10-CM | POA: Diagnosis not present

## 2016-03-01 MED ORDER — TIZANIDINE HCL 2 MG PO TABS: 2.0000 mg | ORAL_TABLET | Freq: Four times a day (QID) | ORAL | Status: DC | PRN

## 2016-03-01 MED ORDER — ALPRAZOLAM 0.5 MG PO TABS: 0.2500 mg | ORAL_TABLET | Freq: Every day | ORAL | Status: DC | PRN

## 2016-03-01 NOTE — Patient Instructions (Addendum)
STOP the baclofen, in place of this we will try TIZANIDINE. Start with 1 tablet (2mg ), if not noticing anything take 2 tablet (4mg )  Go to the pharmacy and pick up the Capzasin cream shown below, apply this to your back   We will try and help with an application to [employer] for transportation   For your X-rays: GO to [employer] and tell them your information. You do not need an appointment for these.  The address for [facility] is below:  Address: [place], [place], [place] [address] [phone]  Address: [address] [place], [place], [place] [address] [phone]

## 2016-03-01 NOTE — Progress Notes (Signed)
   Subjective:    Patient ID: Omar Sanders, male    DOB: 1941/04/01, 75 y.o.   MRN: [id number]  HPI  CC: follow up   # Back pain:  Lidoderm patch was denied by insurance  Interested in x-rays  Went to physical therapy once. Recommended 2x a week x 4 weeks. Has an issue with transportation  Baclofen did not really help  He does describe radicular pain down in his left leg (posterior) that goes to his foot, says this is like a shooting pain ROS: no bowel/bladder incontinence (has some BPH urinary symptoms)  He is still taking the xanax for his headaches, though continues to use smaller amounts  Social Hx: never smoker  Review of Systems   See HPI for ROS.   Past medical history, surgical, family, and social history reviewed and updated in the EMR as appropriate. Objective:  BP 143/79 mmHg  Pulse 95  Temp(Src) 97.6 F (36.4 C) (Oral)  Wt 177 lb 11.2 oz (80.604 kg) Vitals and nursing note reviewed  General: no apparent distress  Back: tender to palpation midline spine lower thoracic through lumbar areas. ROM is limited in rotation, flexion, extension by pain.   Assessment & Plan:  No problem-specific assessment & plan notes found for this encounter.

## 2016-03-01 NOTE — Progress Notes (Signed)
Patient ID: Omar Sanders, male   DOB: 01/27/1941, 75 y.o.   MRN: [id number]   CSW consult to assist patient with completing application to address transportation needs.    CSW assisted patient with completing two applications with the assistance of an interpreter [phone]). [facility] completed and faxed to [employer]. Original application was mailed.   Part A of [employer] application completed, MD completed part B.  CSW  faxed completed application to [place] and mailed the original copy.  Patient provided with a copy of the application.   Patient was appreciative of CSW assisting him and understands he is to follow up on both applications next week. Phone numbers were provided.    [name]. [place] [employer],  [phone] 11:07 AM

## 2016-03-02 NOTE — Assessment & Plan Note (Signed)
Has seen PT, but having issues with transportation to try and get there. Filled out applications for [employer] today. Lidoderm was denied by insurance, so recommended topical capsaicin cream. Baclofen without much relief, replaced this with tizanidine. Follow up 1 month.

## 2016-03-06 ENCOUNTER — Telehealth: Payer: Self-pay | Admitting: *Deleted

## 2016-03-06 NOTE — Telephone Encounter (Signed)
Pt called in saying that medication dr Lamar Benes prescribed at last visit doesn't help, needs something else. Deseree Kennon Holter, CMA

## 2016-03-06 NOTE — Telephone Encounter (Signed)
Will forward to MD.  

## 2016-03-06 NOTE — Telephone Encounter (Signed)
Pt called and wanted you to call him. Couldn't understand what he was saying, but he said you helped him when he was recently in the office. Deseree Kennon Holter, CMA

## 2016-03-07 ENCOUNTER — Encounter: Payer: Medicare Other | Admitting: Physical Therapy

## 2016-03-07 NOTE — Telephone Encounter (Signed)
Call from patient requesting to speak to CSW.   Call to [employer] to confirm receipt of patient's application.  Application received, [employer] will call patient this week to set up a face to face interview.    Call to [facility].  Patients' information has been received and he is now active in the system.     Call to patient using [employer] Donnetta Hail [phone]) to provide patient with an update on his transportation services.  CSW assisted patient with calling DSS to set up transportation appointment for June 1st , pharmacy visits and transportation to therapy appointment.    Patient was appreciative and thankful of CSW assisting him with this process.  [name]. [place] [employer],  [phone] 2:59 PM

## 2016-03-09 ENCOUNTER — Encounter: Payer: Medicare Other | Admitting: Physical Therapy

## 2016-03-14 ENCOUNTER — Telehealth: Payer: Self-pay | Admitting: Licensed Clinical Social Worker

## 2016-03-14 ENCOUNTER — Encounter: Payer: Medicare Other | Admitting: Physical Therapy

## 2016-03-14 ENCOUNTER — Ambulatory Visit: Payer: Medicare Other | Admitting: Physical Therapy

## 2016-03-14 NOTE — Telephone Encounter (Signed)
Call from [place]  [phone] [place]) needed clarification on information completed on patient's [employer] application.  They will move forward with setting up patient's face to face interview.  [name]. [place] Work,  [phone] 9:03 AM

## 2016-03-16 ENCOUNTER — Encounter: Payer: Medicare Other | Admitting: Physical Therapy

## 2016-03-21 ENCOUNTER — Ambulatory Visit: Payer: Medicare Other | Attending: Family Medicine

## 2016-03-21 DIAGNOSIS — M5442 Lumbago with sciatica, left side: Secondary | ICD-10-CM | POA: Insufficient documentation

## 2016-03-21 DIAGNOSIS — R293 Abnormal posture: Secondary | ICD-10-CM | POA: Diagnosis not present

## 2016-03-21 DIAGNOSIS — R252 Cramp and spasm: Secondary | ICD-10-CM | POA: Diagnosis not present

## 2016-03-21 NOTE — Therapy (Signed)
[place], [place], [address] Phone: [phone]   Fax:  [phone]  Physical Therapy Treatment  Patient Details  Name: Omar Sanders MRN: [id number] Date of Birth: 03-14-1941 Referring Provider: Tawanna Sat, MD  Encounter Date: 03/21/2016      PT End of Session - 03/21/16 1227    Visit Number 2   Number of Visits 8   Date for PT Re-Evaluation 03/24/16   PT Start Time 1230   PT Stop Time 1322   PT Time Calculation (min) 52 min   Activity Tolerance Patient limited by fatigue;Patient tolerated treatment well   Behavior During Therapy [facility] for tasks assessed/performed      Past Medical History  Diagnosis Date  . GERD (gastroesophageal reflux disease)   . Hypertension   . Headache(784.0)   . Neuromuscular disorder ([place])     periferal neuropathy per patient  . OSA (obstructive sleep apnea) 11/2009    sleep study with mild OSA, pt unwilling to use CPAP  . Anxiety   . Incontinent of urine     Hx:of  . Hyperlipidemia     Hx: of  . Allergy   . Anemia   . Chronic pain   . Migraines   . Benign prostatic hyperplasia   . IBS (irritable bowel syndrome)   . Hemorrhoids   . Colon polyp   . Anal fissure   . Seizures [facility])     Past Surgical History  Procedure Laterality Date  . Cardiac catheterization  06/04/2009    mild to moderate CAD  w/o hemodynamic significant lesion  . Colonoscopy  2012    Dr Collene Mares  . Insertion of mesh Bilateral 03/11/2013    Procedure: INSERTION OF MESH;  Surgeon: Ralene Ok, MD;  Location: [place];  Service: General;  Laterality: Bilateral;  . Inguinal hernia repair Bilateral 03/11/2013    Procedure: LAPAROSCOPIC BILATERAL INGUINAL HERNIA REPAIR;  Surgeon: Ralene Ok, MD;  Location: [place];  Service: General;  Laterality: Bilateral;  . Cholecystectomy  2008  . US echocardiography  05/27/2009    mild mitral annular ca+,AOV mildly sclerotic  . Nm myocar perf wall motion   05/27/2009    mild iscemia mid inferio & apical regions.  . Nm myocar perf wall motion  11/01/2012    negative for ischemia  . Hemorrhoid surgery      anal surgery    There were no vitals filed for this visit.      Subjective Assessment - 03/21/16 1235    Subjective He reports no better . Has not been to MD as appointment on 03/30/16.    Patient is accompained by: Interpreter   Currently in Pain? Yes   Pain Score 7    Pain Location Back   Pain Orientation Left;Posterior   Pain Type Chronic pain   Pain Radiating Towards LT hip and leg.    Pain Onset More than a month ago   Pain Frequency Constant   Pain Relieving Factors uses ice and heat with pain decr for 2-3 hours pain about 5/10   Multiple Pain Sites No            [facility] PT Assessment - 03/21/16 0001    Strength   Overall Strength Within functional limits for tasks performed  inn both LE with complaint of pain in LT leg and RT quad  [facility] Adult PT Treatment/Exercise - 03/21/16 0001    Exercises   Exercises Lumbar   Lumbar Exercises: Stretches   Single Knee to Chest Stretch 1 rep;20 seconds  RT and Lt   Lower Trunk Rotation 5 reps  5 sec RT and LT   Lower Trunk Rotation Limitations tactile and verbal cues   Pelvic Tilt Limitations 10 reps 5 sec with tactile cues and verbal cues   Lumbar Exercises: Supine   Bridge 10 reps;2 seconds   Lumbar Exercises: Sidelying   Clam 10 reps   Clam Limitations RT side lye  with cues tactile and verbal for posture , decr rolling back   Modalities   Modalities Electrical Stimulation;Moist Heat   Moist Heat Therapy   Number Minutes Moist Heat 15 Minutes   Moist Heat Location Hip  LT   Manual Therapy   Manual Therapy Joint mobilization;Soft tissue mobilization;Manual Traction   Joint Mobilization LT sacrum with LT leg ER , lower lumbar L2-5 Gr 3x25 each   Soft tissue mobilization LT hip gluteas and lower lumbar paraspinals and LT hip with passive  rotation prone r /sidelye   Manual Traction LT leg pull 10 x 10 second he reports discomfort at ankle and no back pain.                 PT Education - 03/21/16 1313    Education provided Yes   Education Details HEP   Person(s) Educated Patient   Methods Explanation;Demonstration;Tactile cues;Verbal cues;Handout   Comprehension Returned demonstration;Verbalized understanding             PT Long Term Goals - 03/21/16 1238    PT LONG TERM GOAL #1   Title He will be independent with all HEP issued    Status On-going   PT LONG TERM GOAL #2   Title He will report pain decreased 50% or more and be itermittant.    Status On-going   PT LONG TERM GOAL #3   Title He will be able to walk without device in and out of home.    Status On-going   PT LONG TERM GOAL #4   Title He will be able to perform transitional movements of sit to stand and supine to sit with 1-2/10 pain max   Status On-going   PT LONG TERM GOAL #5   Title His FOTO score witll decreased < 50% limited to demo functional improvement   Status Unable to assess               Plan - 03/21/16 1228    Clinical Impression Statement Hewas able to do all exercise all with cues but he did not express incr pain or change his expression with exercise. His strength is normal in LE and abdominals are fair to good    After exercise and manual pain 5/10.  appears less shifted today   PT Next Visit Plan Stretching, assess strength, STW and mobs, possible tape, modalities as needed, possible traction manual or mechanical   Consulted and Agree with Plan of Care Patient      Patient will benefit from skilled therapeutic intervention in order to improve the following deficits and impairments:  Decreased activity tolerance, Pain, Increased muscle spasms, Decreased range of motion, Postural dysfunction  Visit Diagnosis: Bilateral low back pain with left-sided sciatica  Abnormal posture  Cramp and spasm     Problem  List Patient Active Problem List   Diagnosis Date Noted  . Lightheadedness 02/14/2016  .  Chronic kidney disease 01/19/2016  . Dyspnea 11/02/2015  . BPH (benign prostatic hyperplasia) 05/06/2015  . Low back pain with left-sided sciatica 03/15/2015  . Leg swelling 02/01/2015  . Anxiety state 12/29/2014  . Leg pain, bilateral 11/18/2014  . Headache 11/18/2014  . Healthcare maintenance 10/01/2014  . Anemia 10/31/2012  . GERD 05/19/2010  . Irritable bowel syndrome 05/19/2010  . PERSONAL HX COLONIC POLYPS 05/19/2010  . DYSLIPIDEMIA 04/10/2007  . Essential hypertension 04/10/2007  . HIATAL HERNIA 04/10/2007    Darrel Hoover PT 03/21/2016, 1:22 PM  [facility] [address] [place], [place], [address] Phone: [phone]   Fax:  [phone]  Name: PARTHA HORTA MRN: [id number] Date of Birth: 1941/04/11

## 2016-03-21 NOTE — Patient Instructions (Signed)
Lower Trunk Rotation Stretch    Keeping back flat and feet together, rotate knees to left side. Hold _3-5___ seconds. Repeat ___10 _ times per set. Do __1-2__ sets per session. Do _2-3___ sessions per day.   RIGHT and LEFT  http://orth.exer.us/122   Copyright  VHI. All rights reserved.  Knee to Chest    Lying supine, bend involved knee to chest _3x __ times. Repeat with other leg. Do __2-3_ times per day.    HOLD  10 to 20 seconds   Pelvic Tilt: Posterior - Legs Bent (Supine)    Tighten stomach and flatten back by rolling pelvis down. Hold 5-10____ seconds. Relax. Repeat _10-15___ times per set. Do __1-2__ sets per session. Do _1-2___ sessions per day.  http://orth.exer.us/202   Copyright  VHI. All rights reserved.  External Rotation: Hip - Knees Apart (Side-Lying)    Lie on left side with hips and knees slightly bent, band tied just above knees. Pull knees apart. Hold for _1-3__ seconds. Rest for _1-2__ seconds. Repeat _10-15__ times. Do _1-2__ times a day.          No band.   Copyright  VHI. All rights reserved.    Copyright  VHI. All rights reserved.

## 2016-03-30 ENCOUNTER — Ambulatory Visit (INDEPENDENT_AMBULATORY_CARE_PROVIDER_SITE_OTHER): Payer: Medicare Other | Admitting: Family Medicine

## 2016-03-30 ENCOUNTER — Encounter: Payer: Self-pay | Admitting: Family Medicine

## 2016-03-30 VITALS — BP 102/69 | HR 62 | Temp 98.3°F | Ht 67.0 in | Wt 181.0 lb

## 2016-03-30 DIAGNOSIS — K581 Irritable bowel syndrome with constipation: Secondary | ICD-10-CM

## 2016-03-30 DIAGNOSIS — M5442 Lumbago with sciatica, left side: Secondary | ICD-10-CM | POA: Diagnosis not present

## 2016-03-30 DIAGNOSIS — F411 Generalized anxiety disorder: Secondary | ICD-10-CM | POA: Diagnosis not present

## 2016-03-30 MED ORDER — HYDROCORTISONE 2.5 % RE CREA: 1.0000 "application " | TOPICAL_CREAM | Freq: Two times a day (BID) | RECTAL | Status: DC

## 2016-03-30 NOTE — Patient Instructions (Addendum)
Back pain:  Focus on the exercises the physical therapists went over. Continue icing or heating your back as needed. Continue tylenol up to 650mg  every 4 hours.   Constipation:  We are going to do a trial. I would recommend trying each one of these for 1-2 weeks.  #1: Amitiza take one tablet a day and ONLY this medicine. Do not take any other medicines. If you are backed up take a second amitiza each day as needed.  #2: Amitiza take one tablet a day and Senna-docusate one tablet a day.  #3: Restart miralax 1 packet/capful a day, then take amitiza as needed

## 2016-03-30 NOTE — Progress Notes (Signed)
   Subjective:    Patient ID: Omar Sanders, male    DOB: Jun 13, 1941, 75 y.o.   MRN: [id number]  HPI  Omar Sanders [id number]  CC: stomach  # IBS:  Feels fine when he is able to go to the bathroom, and when he does not move bowels he feels bad  Taking stool softener (takes senna every day, then if nothing will takes amitiza as needed. Takes Amitiza only as needed, uses maybe 3-4 times a month. If he takes it every day he goes to the bathroom too frequently. He has never tried taking the amitiza every day ROS: no blood in stool, no current abdominal pain, diarrhea only with amitiza  # Back pain  Tizanidine did not help much  Started physical therapy and went once, says he is going to have issues with rides and not able to go as often as they want 2x a week. He thinks he won't be able to go anymore. Gave him some exercises.   Thinks some of the pain is worse in his back where they did an injection 6 years ago. Says this pain has been there since the injection. ROS: no leg numbness/weakness  Social Hx: never smoker  Review of Systems   See HPI for ROS.   Past medical history, surgical, family, and social history reviewed and updated in the EMR as appropriate. Objective:  BP 102/69 mmHg  Pulse 62  Temp(Src) 98.3 F (36.8 C) (Oral)  Ht 5\' 7"  (1.702 m)  Wt 181 lb (82.101 kg)  BMI 28.34 kg/m2 Vitals and nursing note reviewed  General: no apparent distress  Neuro: ambulates on own without use of assistive device.   Assessment & Plan:  Irritable bowel syndrome Continues to have intermittent issues, primarily constipation. It appears that he does get good response from [place] but doesn't like diarrhea it produces. It does not appear he has used it in sole use, only ever in combination. I'm worried that using the senna may cause his bowels to become dependent. I talked with him about doing a trial of only amitiza, then [place] with senna as needed. Also given a third trial to do with  miralax daily then amitiza as needed. If he continues to have issues after this would recommend re-referral to GI (needs to see Dr. Collene Mares).  Anxiety state Still titrating down alprazolam. Last refill was in beginning of May with 1 refill for #15 tablets 0.5mg  with instructions to take 1/2 tablet. Continue to decrease this with each refill.  Low back pain with left-sided sciatica Discussed x-ray results which shows multi level degenerative changes and loss of lumbar lordosis. PT most likely to be beneficial for him however he has issues with transportation and is unable to get rides to these visits. Stressed importance of daily exercises/stretches. Recommended continuing tylenol as needed.     Return in about 3 months (around 06/30/2016).

## 2016-03-30 NOTE — Assessment & Plan Note (Signed)
Discussed x-ray results which shows multi level degenerative changes and loss of lumbar lordosis. PT most likely to be beneficial for him however he has issues with transportation and is unable to get rides to these visits. Stressed importance of daily exercises/stretches. Recommended continuing tylenol as needed.

## 2016-03-30 NOTE — Assessment & Plan Note (Addendum)
Continues to have intermittent issues, primarily constipation. It appears that he does get good response from [place] but doesn't like diarrhea it produces. It does not appear he has used it in sole use, only ever in combination. I'm worried that using the senna may cause his bowels to become dependent. I talked with him about doing a trial of only amitiza, then [place] with senna as needed. Also given a third trial to do with miralax daily then amitiza as needed. If he continues to have issues after this would recommend re-referral to GI (needs to see Dr. Collene Mares).

## 2016-03-30 NOTE — Assessment & Plan Note (Signed)
Still titrating down alprazolam. Last refill was in beginning of May with 1 refill for #15 tablets 0.5mg  with instructions to take 1/2 tablet. Continue to decrease this with each refill.

## 2016-04-04 ENCOUNTER — Ambulatory Visit: Payer: Medicare Other | Attending: Family Medicine | Admitting: Physical Therapy

## 2016-04-04 DIAGNOSIS — M5442 Lumbago with sciatica, left side: Secondary | ICD-10-CM | POA: Diagnosis not present

## 2016-04-04 DIAGNOSIS — R252 Cramp and spasm: Secondary | ICD-10-CM | POA: Diagnosis not present

## 2016-04-04 DIAGNOSIS — R293 Abnormal posture: Secondary | ICD-10-CM | POA: Insufficient documentation

## 2016-04-04 NOTE — Therapy (Signed)
[place], [place], [address] Phone: [phone]   Fax:  [phone]  Physical Therapy Treatment  Patient Details  Name: Omar Sanders MRN: [id number] Date of Birth: January 31, 1941 Referring Provider: Tawanna Sat, MD  Encounter Date: 04/04/2016      PT End of Session - 04/04/16 1105    Visit Number 3   Number of Visits 8   Date for PT Re-Evaluation 03/24/16   Authorization Type Medicare [employer]   PT Start Time 1100   PT Stop Time 1145   PT Time Calculation (min) 45 min      Past Medical History  Diagnosis Date  . GERD (gastroesophageal reflux disease)   . Hypertension   . Headache(784.0)   . Neuromuscular disorder ([place])     periferal neuropathy per patient  . OSA (obstructive sleep apnea) 11/2009    sleep study with mild OSA, pt unwilling to use CPAP  . Anxiety   . Incontinent of urine     Hx:of  . Hyperlipidemia     Hx: of  . Allergy   . Anemia   . Chronic pain   . Migraines   . Benign prostatic hyperplasia   . IBS (irritable bowel syndrome)   . Hemorrhoids   . Colon polyp   . Anal fissure   . Seizures [facility])     Past Surgical History  Procedure Laterality Date  . Cardiac catheterization  06/04/2009    mild to moderate CAD  w/o hemodynamic significant lesion  . Colonoscopy  2012    Dr Collene Mares  . Insertion of mesh Bilateral 03/11/2013    Procedure: INSERTION OF MESH;  Surgeon: Ralene Ok, MD;  Location: [place];  Service: General;  Laterality: Bilateral;  . Inguinal hernia repair Bilateral 03/11/2013    Procedure: LAPAROSCOPIC BILATERAL INGUINAL HERNIA REPAIR;  Surgeon: Ralene Ok, MD;  Location: [place];  Service: General;  Laterality: Bilateral;  . Cholecystectomy  2008  . US echocardiography  05/27/2009    mild mitral annular ca+,AOV mildly sclerotic  . Nm myocar perf wall motion  05/27/2009    mild iscemia mid inferio & apical regions.  . Nm myocar perf wall motion  11/01/2012   negative for ischemia  . Hemorrhoid surgery      anal surgery    There were no vitals filed for this visit.      Subjective Assessment - 04/04/16 1102    Subjective The pain was worse this moring. Better after keeping moving.    Currently in Pain? Yes   Pain Score 5    Pain Location Buttocks   Pain Orientation Left   Pain Descriptors / Indicators --  like the pain is moving    Pain Type Chronic pain   Pain Radiating Towards LT hip and calf   Aggravating Factors  mornings and evenings    Pain Relieving Factors keep moving            [facility] PT Assessment - 04/04/16 0001    ROM / Strength   AROM / PROM / Strength Strength   Strength   Strength Assessment Site Hip   Right/Left Hip Right;Left   Right Hip Flexion 5/5   Right Hip ABduction 5/5   Left Hip Flexion 4-/5   Left Hip ABduction 4-/5                     [facility] Adult PT Treatment/Exercise - 04/04/16 0001    Self-Care  Self-Care ADL's;Posture   ADL's avoid flexion   Posture Seated in chair with knees and hips at 90/90, mubar roll, good posture   Lumbar Exercises: Stretches   Standing Extension 5 reps;10 seconds   Prone on Elbows Stretch 60 seconds   Press Ups 5 reps;10 seconds   Press Ups Limitations with over pressure   Lumbar Exercises: Prone   Other Prone Lumbar Exercises hamstring curs x 10 and donkey kicks x10 left increased thigh pain so DC   Manual Therapy   Manual Traction LT leg pull 10 x 10 second he reports only pain in hip not in leg                     PT Long Term Goals - 04/04/16 1224    PT LONG TERM GOAL #1   Title He will be independent with all HEP issued    Time 4   Period Weeks   Status On-going   PT LONG TERM GOAL #2   Title He will report pain decreased 50% or more and be itermittant.    Time 4   Period Weeks   Status On-going   PT LONG TERM GOAL #3   Title He will be able to walk without device in and out of home.    Time 4   Period Weeks   Status  On-going   PT LONG TERM GOAL #4   Title He will be able to perform transitional movements of sit to stand and supine to sit with 1-2/10 pain max   Time 4   Period Weeks   Status On-going   PT LONG TERM GOAL #5   Title His FOTO score witll decreased < 50% limited to demo functional improvement   Time 4   Period Weeks   Status Unable to assess               Plan - 04/04/16 1211    Clinical Impression Statement Trial of Prone press ups with overpressure and pt reported decreased leg pain to  only left buttock. Hip strength less left vs right. Self Care reveiwed with pt including seated with lumbar roll, avoiding flexion and trying standing lumbar extensions throughout the day. Pt verbalizes understanding. He reports no pain in thigh and 3/10 pain in lateral ankle after treatment.    PT Next Visit Plan ASSESS LUMBAR EXTENSIONS AND IF THEY HELPED LEG PAIN. Stretching, assess strength, STW and mobs, possible tape, modalities as needed, possible traction manual or mechanical      Patient will benefit from skilled therapeutic intervention in order to improve the following deficits and impairments:  Decreased activity tolerance, Pain, Increased muscle spasms, Decreased range of motion, Postural dysfunction  Visit Diagnosis: Bilateral low back pain with left-sided sciatica  Abnormal posture  Cramp and spasm     Problem List Patient Active Problem List   Diagnosis Date Noted  . Lightheadedness 02/14/2016  . Chronic kidney disease 01/19/2016  . Dyspnea 11/02/2015  . BPH (benign prostatic hyperplasia) 05/06/2015  . Low back pain with left-sided sciatica 03/15/2015  . Leg swelling 02/01/2015  . Anxiety state 12/29/2014  . Leg pain, bilateral 11/18/2014  . Headache 11/18/2014  . Healthcare maintenance 10/01/2014  . Anemia 10/31/2012  . GERD 05/19/2010  . Irritable bowel syndrome 05/19/2010  . PERSONAL HX COLONIC POLYPS 05/19/2010  . DYSLIPIDEMIA 04/10/2007  . Essential  hypertension 04/10/2007  . HIATAL HERNIA 04/10/2007    Dorene Ar , PTA  04/04/2016,  12:28 PM  [facility] [address] [place], [place], [address] Phone: [phone]   Fax:  [phone]  Name: Omar Sanders MRN: [id number] Date of Birth: December 25, 1940

## 2016-04-06 ENCOUNTER — Ambulatory Visit: Payer: Medicare Other | Admitting: Physical Therapy

## 2016-04-06 DIAGNOSIS — R252 Cramp and spasm: Secondary | ICD-10-CM

## 2016-04-06 DIAGNOSIS — R293 Abnormal posture: Secondary | ICD-10-CM

## 2016-04-06 DIAGNOSIS — M5442 Lumbago with sciatica, left side: Secondary | ICD-10-CM

## 2016-04-07 DIAGNOSIS — R252 Cramp and spasm: Secondary | ICD-10-CM | POA: Diagnosis not present

## 2016-04-07 DIAGNOSIS — M5442 Lumbago with sciatica, left side: Secondary | ICD-10-CM | POA: Diagnosis not present

## 2016-04-07 DIAGNOSIS — R293 Abnormal posture: Secondary | ICD-10-CM | POA: Diagnosis not present

## 2016-04-07 NOTE — Therapy (Signed)
[place], [place], [address] Phone: [phone]   Fax:  [phone]  Physical Therapy Treatment  Patient Details  Name: Omar Sanders MRN: [id number] Date of Birth: [birth year] Referring Provider: Tawanna Sat, MD  Encounter Date: 04/06/2016      PT End of Session - 04/06/16 2115    Visit Number 4   Number of Visits 20   Date for PT Re-Evaluation 05/19/16   Authorization Type Medicare UHC   PT Start Time 1100   PT Stop Time 1146   PT Time Calculation (min) 46 min   Activity Tolerance Patient limited by fatigue;Patient tolerated treatment well   Behavior During Therapy [facility] for tasks assessed/performed      Past Medical History  Diagnosis Date  . GERD (gastroesophageal reflux disease)   . Hypertension   . Headache(784.0)   . Neuromuscular disorder ([place])     periferal neuropathy per patient  . OSA (obstructive sleep apnea) 11/2009    sleep study with mild OSA, pt unwilling to use CPAP  . Anxiety   . Incontinent of urine     Hx:of  . Hyperlipidemia     Hx: of  . Allergy   . Anemia   . Chronic pain   . Migraines   . Benign prostatic hyperplasia   . IBS (irritable bowel syndrome)   . Hemorrhoids   . Colon polyp   . Anal fissure   . Seizures [facility])     Past Surgical History  Procedure Laterality Date  . Cardiac catheterization  06/04/2009    mild to moderate CAD  w/o hemodynamic significant lesion  . Colonoscopy  2012    Dr Collene Mares  . Insertion of mesh Bilateral 03/11/2013    Procedure: INSERTION OF MESH;  Surgeon: Ralene Ok, MD;  Location: [place];  Service: General;  Laterality: Bilateral;  . Inguinal hernia repair Bilateral 03/11/2013    Procedure: LAPAROSCOPIC BILATERAL INGUINAL HERNIA REPAIR;  Surgeon: Ralene Ok, MD;  Location: [place];  Service: General;  Laterality: Bilateral;  . Cholecystectomy  2008  . US echocardiography  05/27/2009    mild mitral annular ca+,AOV mildly sclerotic   . Nm myocar perf wall motion  05/27/2009    mild iscemia mid inferio & apical regions.  . Nm myocar perf wall motion  11/01/2012    negative for ischemia  . Hemorrhoid surgery      anal surgery    There were no vitals filed for this visit.      Subjective Assessment - 04/06/16 1114    Subjective The patient reports he had less pain after the last visit but that night the pain came back and got worse down into the left leg.    Patient is accompained by: Interpreter   Pertinent History Chronic back pain with dgenerative changes in lower back.    Limitations Walking;Standing;Sitting   Diagnostic tests No testing  recently   Patient Stated Goals Ease the pain    Currently in Pain? Yes   Pain Score 6    Pain Location Buttocks   Pain Orientation Left   Pain Descriptors / Indicators Aching   Pain Type Chronic pain   Pain Radiating Towards lt hip and into his calf    Pain Onset More than a month ago   Pain Frequency Constant   Aggravating Factors  mornings and evenigs    Pain Relieving Factors keep moving    Multiple Pain Sites No  PT Short Term Goals - April 19, 2016 1206    PT SHORT TERM GOAL #1   Title Patient will report centralization of pain from the left leg into the lower back    Time 4   Period Weeks   Status New   PT SHORT TERM GOAL #2   Title Patient will be independent with initial HEP   Time 4   Period Weeks   Status New   PT SHORT TERM GOAL #3   Title Patient will increase gorss left lower extremity strrength to 4+/5    Time 4   Period Weeks   Status New           PT Long Term Goals - 04/04/16 1224    PT LONG TERM GOAL #1   Title He will be independent with all HEP issued    Time 4   Period Weeks   Status On-going   PT LONG TERM GOAL #2   Title He will report pain decreased 50% or more and be itermittant.    Time 4   Period Weeks   Status On-going   PT LONG TERM GOAL #3   Title He will be  able to walk without device in and out of home.    Time 4   Period Weeks   Status On-going   PT LONG TERM GOAL #4   Title He will be able to perform transitional movements of sit to stand and supine to sit with 1-2/10 pain max   Time 4   Period Weeks   Status On-going   PT LONG TERM GOAL #5   Title His FOTO score witll decreased < 50% limited to demo functional improvement   Time 4   Period Weeks   Status Unable to assess               Plan - 04/06/16 2118/02/09    Clinical Impression Statement Patient had no improvement in lower back pain today with prone press up. He had pain with all lower back exercises and stretches. He continued to have pain into his left calf post treatment. The patient has not reached any goals at this time but he is only on his  3rd treatment from  evaluation. He had some relief aftere last visit. He would benefit from further skilled therapy.    Rehab Potential Fair   PT Frequency 2x / week   PT Duration 4 weeks   PT Treatment/Interventions Electrical Stimulation;Moist Heat;Traction;Therapeutic activities;Therapeutic exercise;Manual techniques;Taping;Dry needling;Passive range of motion;Patient/family education   PT Next Visit Plan ASSESS LUMBAR EXTENSIONS AND IF THEY HELPED LEG PAIN. Stretching, assess strength, STW and mobs, possible tape, modalities as needed, possible traction manual or mechanical   PT Home Exercise Plan lateral shifting   Consulted and Agree with Plan of Care Patient      Patient will benefit from skilled therapeutic intervention in order to improve the following deficits and impairments:  Decreased activity tolerance, Pain, Increased muscle spasms, Decreased range of motion, Postural dysfunction  Visit Diagnosis: Bilateral low back pain with left-sided sciatica - Plan: PT plan of care cert/re-cert  Abnormal posture - Plan: PT plan of care cert/re-cert  Cramp and spasm - Plan: PT plan of care cert/re-cert       G-Codes -  04/19/2016 02/10/1208    Functional Assessment Tool Used clinical judgment; FOTO    Functional Limitation Other PT primary   Other PT Primary Current Status [id number]) At least 80 percent but less than 100  percent impaired, limited or restricted   Other PT Primary Goal Status [id number]) At least 40 percent but less than 60 percent impaired, limited or restricted      Problem List Patient Active Problem List   Diagnosis Date Noted  . Lightheadedness 02/14/2016  . Chronic kidney disease 01/19/2016  . Dyspnea 11/02/2015  . BPH (benign prostatic hyperplasia) 05/06/2015  . Low back pain with left-sided sciatica 03/15/2015  . Leg swelling 02/01/2015  . Anxiety state 12/29/2014  . Leg pain, bilateral 11/18/2014  . Headache 11/18/2014  . Healthcare maintenance 10/01/2014  . Anemia 10/31/2012  . GERD 05/19/2010  . Irritable bowel syndrome 05/19/2010  . PERSONAL HX COLONIC POLYPS 05/19/2010  . DYSLIPIDEMIA 04/10/2007  . Essential hypertension 04/10/2007  . HIATAL HERNIA 04/10/2007    Carney Living  PT DPT   04/07/2016, 12:17 PM  [facility] [address] [place], [place], [address] Phone: [phone]   Fax:  [phone]  Name: BURLON SAMA MRN: [id number] Date of Birth: 06-12-1941

## 2016-04-11 ENCOUNTER — Ambulatory Visit: Payer: Medicare Other | Admitting: Physical Therapy

## 2016-04-11 ENCOUNTER — Encounter: Payer: Self-pay | Admitting: Physical Therapy

## 2016-04-11 DIAGNOSIS — R293 Abnormal posture: Secondary | ICD-10-CM | POA: Diagnosis not present

## 2016-04-11 DIAGNOSIS — M5442 Lumbago with sciatica, left side: Secondary | ICD-10-CM

## 2016-04-11 DIAGNOSIS — R252 Cramp and spasm: Secondary | ICD-10-CM | POA: Diagnosis not present

## 2016-04-11 NOTE — Therapy (Signed)
[place], [place], [address] Phone: [phone]   Fax:  [phone]  Physical Therapy Treatment  Patient Details  Name: Omar Sanders MRN: [id number] Date of Birth: 10/24/1941 Referring Provider: Tawanna Sat, MD  Encounter Date: 04/11/2016      PT End of Session - 04/11/16 1259    Visit Number 5   Number of Visits 20   Date for PT Re-Evaluation 05/19/16   Authorization Type Medicare UHC   PT Start Time 1100   PT Stop Time 1149   PT Time Calculation (min) 49 min   Activity Tolerance Patient tolerated treatment well;No increased pain   Behavior During Therapy [facility] for tasks assessed/performed      Past Medical History  Diagnosis Date  . GERD (gastroesophageal reflux disease)   . Hypertension   . Headache(784.0)   . Neuromuscular disorder ([place])     periferal neuropathy per patient  . OSA (obstructive sleep apnea) 11/2009    sleep study with mild OSA, pt unwilling to use CPAP  . Anxiety   . Incontinent of urine     Hx:of  . Hyperlipidemia     Hx: of  . Allergy   . Anemia   . Chronic pain   . Migraines   . Benign prostatic hyperplasia   . IBS (irritable bowel syndrome)   . Hemorrhoids   . Colon polyp   . Anal fissure   . Seizures [facility])     Past Surgical History  Procedure Laterality Date  . Cardiac catheterization  06/04/2009    mild to moderate CAD  w/o hemodynamic significant lesion  . Colonoscopy  2012    Dr Collene Mares  . Insertion of mesh Bilateral 03/11/2013    Procedure: INSERTION OF MESH;  Surgeon: Ralene Ok, MD;  Location: [place];  Service: General;  Laterality: Bilateral;  . Inguinal hernia repair Bilateral 03/11/2013    Procedure: LAPAROSCOPIC BILATERAL INGUINAL HERNIA REPAIR;  Surgeon: Ralene Ok, MD;  Location: [place];  Service: General;  Laterality: Bilateral;  . Cholecystectomy  2008  . US echocardiography  05/27/2009    mild mitral annular ca+,AOV mildly sclerotic  . Nm  myocar perf wall motion  05/27/2009    mild iscemia mid inferio & apical regions.  . Nm myocar perf wall motion  11/01/2012    negative for ischemia  . Hemorrhoid surgery      anal surgery    There were no vitals filed for this visit.      Subjective Assessment - 04/11/16 1105    Subjective pain from back to lateral L foot today. Feels very good after therapy but pain returns next day, does HEP but does not feel the same.    Currently in Pain? Yes   Pain Score 7    Pain Location Buttocks   Pain Orientation Left                         [facility] Adult PT Treatment/Exercise - 04/11/16 0001    Lumbar Exercises: Aerobic   Stationary Bike NUstep 5 min L5   Lumbar Exercises: Standing   Other Standing Lumbar Exercises gait training with SPC   Lumbar Exercises: Prone   Other Prone Lumbar Exercises prone press ups x10; alt hip ext 10 ea; UE flx x10; prone glut sets 10x3s   Modalities   Modalities Cryotherapy   Cryotherapy   Number Minutes Cryotherapy 10 Minutes   Cryotherapy Location  Lumbar Spine   Type of Cryotherapy Ice pack   Manual Therapy   Joint Mobilization lumbar P-A gr 3   Soft tissue mobilization L piriformis                PT Education - 04/11/16 1131    Education provided Yes   Education Details use of tennis ball, exercise form/rationale, radicular pain, gait pattern with pain   Person(s) Educated Patient   Methods Explanation;Demonstration;Tactile cues;Verbal cues   Comprehension Verbalized understanding;Returned demonstration;Verbal cues required;Tactile cues required;Need further instruction          PT Short Term Goals - 04/07/16 1206    PT SHORT TERM GOAL #1   Title Patient will report centralization of pain from the left leg into the lower back    Time 4   Period Weeks   Status New   PT SHORT TERM GOAL #2   Title Patient will be independent with initial HEP   Time 4   Period Weeks   Status New   PT SHORT TERM GOAL #3   Title  Patient will increase gorss left lower extremity strrength to 4+/5    Time 4   Period Weeks   Status New           PT Long Term Goals - 04/04/16 1224    PT LONG TERM GOAL #1   Title He will be independent with all HEP issued    Time 4   Period Weeks   Status On-going   PT LONG TERM GOAL #2   Title He will report pain decreased 50% or more and be itermittant.    Time 4   Period Weeks   Status On-going   PT LONG TERM GOAL #3   Title He will be able to walk without device in and out of home.    Time 4   Period Weeks   Status On-going   PT LONG TERM GOAL #4   Title He will be able to perform transitional movements of sit to stand and supine to sit with 1-2/10 pain max   Time 4   Period Weeks   Status On-going   PT LONG TERM GOAL #5   Title His FOTO score witll decreased < 50% limited to demo functional improvement   Time 4   Period Weeks   Status Unable to assess               Plan - 04/11/16 1128    Clinical Impression Statement Pt reported decreased pain with extension and manual treatment today but still reported radicular symptoms to ankle. Positive L trendelenburg in gait.    PT Frequency 2x / week   PT Duration 4 weeks   PT Treatment/Interventions Electrical Stimulation;Moist Heat;Traction;Therapeutic activities;Therapeutic exercise;Manual techniques;Taping;Dry needling;Passive range of motion;Patient/family education   PT Next Visit Plan possible traction, piriformis trigger point release, L hip abduction strength.    Consulted and Agree with Plan of Care Patient      Patient will benefit from skilled therapeutic intervention in order to improve the following deficits and impairments:  Decreased activity tolerance, Pain, Increased muscle spasms, Decreased range of motion, Postural dysfunction  Visit Diagnosis: Bilateral low back pain with left-sided sciatica  Abnormal posture  Cramp and spasm     Problem List Patient Active Problem List    Diagnosis Date Noted  . Lightheadedness 02/14/2016  . Chronic kidney disease 01/19/2016  . Dyspnea 11/02/2015  . BPH (benign prostatic hyperplasia) 05/06/2015  .  Low back pain with left-sided sciatica 03/15/2015  . Leg swelling 02/01/2015  . Anxiety state 12/29/2014  . Leg pain, bilateral 11/18/2014  . Headache 11/18/2014  . Healthcare maintenance 10/01/2014  . Anemia 10/31/2012  . GERD 05/19/2010  . Irritable bowel syndrome 05/19/2010  . PERSONAL HX COLONIC POLYPS 05/19/2010  . DYSLIPIDEMIA 04/10/2007  . Essential hypertension 04/10/2007  . HIATAL HERNIA 04/10/2007    Jaylinn Hellenbrand C. Mackinley Cassaday PT, DPT 04/11/2016 1:03 PM   [facility] [address] [place], [place], [address] Phone: [phone]   Fax:  [phone]  Name: KEIR TRAUGHBER MRN: [id number] Date of Birth: 08-29-1941

## 2016-04-13 ENCOUNTER — Encounter: Payer: Self-pay | Admitting: Physical Therapy

## 2016-04-13 ENCOUNTER — Ambulatory Visit: Payer: Medicare Other | Admitting: Physical Therapy

## 2016-04-13 DIAGNOSIS — R252 Cramp and spasm: Secondary | ICD-10-CM

## 2016-04-13 DIAGNOSIS — M5442 Lumbago with sciatica, left side: Secondary | ICD-10-CM | POA: Diagnosis not present

## 2016-04-13 DIAGNOSIS — R293 Abnormal posture: Secondary | ICD-10-CM | POA: Diagnosis not present

## 2016-04-13 NOTE — Therapy (Signed)
[place], [place], [address] Phone: [phone]   Fax:  [phone]  Physical Therapy Treatment  Patient Details  Name: Omar Sanders MRN: [id number] Date of Birth: January 25, 1941 Referring Provider: Tawanna Sat, MD  Encounter Date: 04/13/2016      PT End of Session - 04/13/16 1143    Visit Number 6   Number of Visits 20   Date for PT Re-Evaluation 05/19/16   Authorization Type Medicare UHC   PT Start Time 1104   PT Stop Time 1152   PT Time Calculation (min) 48 min   Activity Tolerance Patient tolerated treatment well   Behavior During Therapy [facility] for tasks assessed/performed      Past Medical History  Diagnosis Date  . GERD (gastroesophageal reflux disease)   . Hypertension   . Headache(784.0)   . Neuromuscular disorder ([place])     periferal neuropathy per patient  . OSA (obstructive sleep apnea) 11/2009    sleep study with mild OSA, pt unwilling to use CPAP  . Anxiety   . Incontinent of urine     Hx:of  . Hyperlipidemia     Hx: of  . Allergy   . Anemia   . Chronic pain   . Migraines   . Benign prostatic hyperplasia   . IBS (irritable bowel syndrome)   . Hemorrhoids   . Colon polyp   . Anal fissure   . Seizures [facility])     Past Surgical History  Procedure Laterality Date  . Cardiac catheterization  06/04/2009    mild to moderate CAD  w/o hemodynamic significant lesion  . Colonoscopy  2012    Dr Collene Mares  . Insertion of mesh Bilateral 03/11/2013    Procedure: INSERTION OF MESH;  Surgeon: Ralene Ok, MD;  Location: [place];  Service: General;  Laterality: Bilateral;  . Inguinal hernia repair Bilateral 03/11/2013    Procedure: LAPAROSCOPIC BILATERAL INGUINAL HERNIA REPAIR;  Surgeon: Ralene Ok, MD;  Location: [place];  Service: General;  Laterality: Bilateral;  . Cholecystectomy  2008  . US echocardiography  05/27/2009    mild mitral annular ca+,AOV mildly sclerotic  . Nm myocar perf wall  motion  05/27/2009    mild iscemia mid inferio & apical regions.  . Nm myocar perf wall motion  11/01/2012    negative for ischemia  . Hemorrhoid surgery      anal surgery    There were no vitals filed for this visit.      Subjective Assessment - 04/13/16 1106    Subjective (p) pt reports feeling really good yesterday and went to bed with very little pain. woke last night with pain and is having pain today. reports sleeping on his back without pillows under his knees   Currently in Pain? (p) Yes   Pain Score (p) 7   down to 5 yesterday when feeling good   Pain Location (p) Back   Pain Orientation (p) Left   Pain Descriptors / Indicators (p) Sharp                         [facility] Adult PT Treatment/Exercise - 04/13/16 0001    Lumbar Exercises: Stretches   Standing Side Bend Other (comment)  10 reps 2s, bending to R   Lumbar Exercises: Standing   Other Standing Lumbar Exercises L SLR leaning against table x10   Lumbar Exercises: Supine   Ab Set 15 reps   AB  Set Limitations with ball squeeze   Moist Heat Therapy   Number Minutes Moist Heat 10 Minutes   Moist Heat Location Lumbar Spine  R sidelying over pillows   Manual Therapy   Joint Mobilization R sidelying over pillows, L lumbar gapping.    Manual Traction L LE long axis 3x1 min                PT Education - 04/13/16 1304    Education provided Yes   Education Details exercise form/rationale, HEP   Person(s) Educated Patient   Methods Explanation;Demonstration;Tactile cues;Verbal cues   Comprehension Verbalized understanding;Returned demonstration;Verbal cues required;Tactile cues required;Need further instruction          PT Short Term Goals - 04/07/16 1206    PT SHORT TERM GOAL #1   Title Patient will report centralization of pain from the left leg into the lower back    Time 4   Period Weeks   Status New   PT SHORT TERM GOAL #2   Title Patient will be independent with initial HEP    Time 4   Period Weeks   Status New   PT SHORT TERM GOAL #3   Title Patient will increase gorss left lower extremity strrength to 4+/5    Time 4   Period Weeks   Status New           PT Long Term Goals - 04/04/16 1224    PT LONG TERM GOAL #1   Title He will be independent with all HEP issued    Time 4   Period Weeks   Status On-going   PT LONG TERM GOAL #2   Title He will report pain decreased 50% or more and be itermittant.    Time 4   Period Weeks   Status On-going   PT LONG TERM GOAL #3   Title He will be able to walk without device in and out of home.    Time 4   Period Weeks   Status On-going   PT LONG TERM GOAL #4   Title He will be able to perform transitional movements of sit to stand and supine to sit with 1-2/10 pain max   Time 4   Period Weeks   Status On-going   PT LONG TERM GOAL #5   Title His FOTO score witll decreased < 50% limited to demo functional improvement   Time 4   Period Weeks   Status Unable to assess               Plan - 04/13/16 1304    Clinical Impression Statement centralized pain today with L lumbar facet gapping. Pt has a difficulty time describing pain sensations but was able to say that radicular pain decreased. Was instructed to lay R sidelying over pillows for 5 minutes before going to bed tonight to see if that helped him sleep.    PT Next Visit Plan possible traction, piriformis trigger point release, L hip abduction strength.    PT Home Exercise Plan tennis ball in piriformis, R sideyling over pillows    Consulted and Agree with Plan of Care Patient      Patient will benefit from skilled therapeutic intervention in order to improve the following deficits and impairments:     Visit Diagnosis: Bilateral low back pain with left-sided sciatica  Abnormal posture  Cramp and spasm     Problem List Patient Active Problem List   Diagnosis Date Noted  . Lightheadedness  02/14/2016  . Chronic kidney disease 01/19/2016   . Dyspnea 11/02/2015  . BPH (benign prostatic hyperplasia) 05/06/2015  . Low back pain with left-sided sciatica 03/15/2015  . Leg swelling 02/01/2015  . Anxiety state 12/29/2014  . Leg pain, bilateral 11/18/2014  . Headache 11/18/2014  . Healthcare maintenance 10/01/2014  . Anemia 10/31/2012  . GERD 05/19/2010  . Irritable bowel syndrome 05/19/2010  . PERSONAL HX COLONIC POLYPS 05/19/2010  . DYSLIPIDEMIA 04/10/2007  . Essential hypertension 04/10/2007  . HIATAL HERNIA 04/10/2007    Zhana Jeangilles C. Nikitta Sobiech PT, DPT 04/13/2016 1:08 PM   [facility] [address] [place], [place], [address] Phone: [phone]   Fax:  [phone]  Name: TALIN MCWHINNIE MRN: [id number] Date of Birth: 10/04/1941

## 2016-04-18 ENCOUNTER — Encounter: Payer: Self-pay | Admitting: Physical Therapy

## 2016-04-18 ENCOUNTER — Ambulatory Visit: Payer: Medicare Other | Admitting: Physical Therapy

## 2016-04-18 DIAGNOSIS — R293 Abnormal posture: Secondary | ICD-10-CM

## 2016-04-18 DIAGNOSIS — M5442 Lumbago with sciatica, left side: Secondary | ICD-10-CM | POA: Diagnosis not present

## 2016-04-18 DIAGNOSIS — R252 Cramp and spasm: Secondary | ICD-10-CM | POA: Diagnosis not present

## 2016-04-18 NOTE — Therapy (Signed)
[place], [place], [address] Phone: [phone]   Fax:  [phone]  Physical Therapy Treatment  Patient Details  Name: Omar Sanders MRN: [id number] Date of Birth: 27-Nov-1940 Referring Provider: Tawanna Sat, MD  Encounter Date: 04/18/2016      PT End of Session - 04/18/16 1135    Visit Number 7   Number of Visits 20   Date for PT Re-Evaluation 05/19/16   Authorization Type Medicare UHC   PT Start Time 1110  pt arrived late   PT Stop Time 1157   PT Time Calculation (min) 47 min   Activity Tolerance Patient tolerated treatment well   Behavior During Therapy [facility] for tasks assessed/performed      Past Medical History  Diagnosis Date  . GERD (gastroesophageal reflux disease)   . Hypertension   . Headache(784.0)   . Neuromuscular disorder ([place])     periferal neuropathy per patient  . OSA (obstructive sleep apnea) 11/2009    sleep study with mild OSA, pt unwilling to use CPAP  . Anxiety   . Incontinent of urine     Hx:of  . Hyperlipidemia     Hx: of  . Allergy   . Anemia   . Chronic pain   . Migraines   . Benign prostatic hyperplasia   . IBS (irritable bowel syndrome)   . Hemorrhoids   . Colon polyp   . Anal fissure   . Seizures [facility])     Past Surgical History  Procedure Laterality Date  . Cardiac catheterization  06/04/2009    mild to moderate CAD  w/o hemodynamic significant lesion  . Colonoscopy  2012    Dr Collene Mares  . Insertion of mesh Bilateral 03/11/2013    Procedure: INSERTION OF MESH;  Surgeon: Ralene Ok, MD;  Location: [place];  Service: General;  Laterality: Bilateral;  . Inguinal hernia repair Bilateral 03/11/2013    Procedure: LAPAROSCOPIC BILATERAL INGUINAL HERNIA REPAIR;  Surgeon: Ralene Ok, MD;  Location: [place];  Service: General;  Laterality: Bilateral;  . Cholecystectomy  2008  . US echocardiography  05/27/2009    mild mitral annular ca+,AOV mildly sclerotic  . Nm  myocar perf wall motion  05/27/2009    mild iscemia mid inferio & apical regions.  . Nm myocar perf wall motion  11/01/2012    negative for ischemia  . Hemorrhoid surgery      anal surgery    There were no vitals filed for this visit.      Subjective Assessment - 04/18/16 1111    Subjective Continued pain from L post hip to ankle but reports it is better. Reports pain was almost all the way gone (for 5 days after last visit) in the leg but woke up yesterday morning with returned radicular pain.    Patient is accompained by: Interpreter   Patient Stated Goals Ease the pain    Currently in Pain? Yes   Pain Score 6    Pain Location Buttocks   Pain Orientation Left   Pain Descriptors / Indicators Sharp  sharp in hip with radiating pain and numbness   Pain Type Chronic pain                         [facility] Adult PT Treatment/Exercise - 04/18/16 0001    Lumbar Exercises: Aerobic   Stationary Bike NUstep 5 min L5   Modalities   Modalities Traction   Moist  Heat Therapy   Number Minutes Moist Heat 10 Minutes   Moist Heat Location Hip  L hip/low back   Traction   Type of Traction Lumbar   Min (lbs) 15   Max (lbs) 80   Hold Time 60   Rest Time 10   Time 10 min                PT Education - 04/18/16 1135    Education provided Yes   Education Details traction, centralization of pain, exercise form/rationale   Person(s) Educated Patient   Methods Explanation;Demonstration;Tactile cues;Verbal cues   Comprehension Verbalized understanding;Returned demonstration;Verbal cues required;Tactile cues required;Need further instruction          PT Short Term Goals - 04/07/16 1206    PT SHORT TERM GOAL #1   Title Patient will report centralization of pain from the left leg into the lower back    Time 4   Period Weeks   Status New   PT SHORT TERM GOAL #2   Title Patient will be independent with initial HEP   Time 4   Period Weeks   Status New   PT SHORT  TERM GOAL #3   Title Patient will increase gorss left lower extremity strrength to 4+/5    Time 4   Period Weeks   Status New           PT Long Term Goals - 04/04/16 1224    PT LONG TERM GOAL #1   Title He will be independent with all HEP issued    Time 4   Period Weeks   Status On-going   PT LONG TERM GOAL #2   Title He will report pain decreased 50% or more and be itermittant.    Time 4   Period Weeks   Status On-going   PT LONG TERM GOAL #3   Title He will be able to walk without device in and out of home.    Time 4   Period Weeks   Status On-going   PT LONG TERM GOAL #4   Title He will be able to perform transitional movements of sit to stand and supine to sit with 1-2/10 pain max   Time 4   Period Weeks   Status On-going   PT LONG TERM GOAL #5   Title His FOTO score witll decreased < 50% limited to demo functional improvement   Time 4   Period Weeks   Status Unable to assess               Plan - 04/18/16 1138    Clinical Impression Statement Pt reported feeling really good following traction, centralization of pain to L hip. will continue to benefit from gapping and stabilization to decrease pain.    PT Next Visit Plan evaluate effectiveness of traction, piriformis trigger point release, L hip abduction strength.    PT Home Exercise Plan tennis ball in piriformis, R sideyling over pillows    Consulted and Agree with Plan of Care Patient      Patient will benefit from skilled therapeutic intervention in order to improve the following deficits and impairments:     Visit Diagnosis: Bilateral low back pain with left-sided sciatica  Abnormal posture  Cramp and spasm     Problem List Patient Active Problem List   Diagnosis Date Noted  . Lightheadedness 02/14/2016  . Chronic kidney disease 01/19/2016  . Dyspnea 11/02/2015  . BPH (benign prostatic hyperplasia) 05/06/2015  . Low  back pain with left-sided sciatica 03/15/2015  . Leg swelling  02/01/2015  . Anxiety state 12/29/2014  . Leg pain, bilateral 11/18/2014  . Headache 11/18/2014  . Healthcare maintenance 10/01/2014  . Anemia 10/31/2012  . GERD 05/19/2010  . Irritable bowel syndrome 05/19/2010  . PERSONAL HX COLONIC POLYPS 05/19/2010  . DYSLIPIDEMIA 04/10/2007  . Essential hypertension 04/10/2007  . HIATAL HERNIA 04/10/2007    Tytianna Greenley C. Livan Hires PT, DPT 04/18/2016 12:45 PM   [facility] [address] [place], [place], [address] Phone: [phone]   Fax:  [phone]  Name: Omar Sanders MRN: [id number] Date of Birth: 1941-02-19

## 2016-04-20 ENCOUNTER — Ambulatory Visit: Payer: Medicare Other | Admitting: Physical Therapy

## 2016-04-20 ENCOUNTER — Encounter: Payer: Self-pay | Admitting: Physical Therapy

## 2016-04-20 DIAGNOSIS — R293 Abnormal posture: Secondary | ICD-10-CM | POA: Diagnosis not present

## 2016-04-20 DIAGNOSIS — R252 Cramp and spasm: Secondary | ICD-10-CM | POA: Diagnosis not present

## 2016-04-20 DIAGNOSIS — M5442 Lumbago with sciatica, left side: Secondary | ICD-10-CM

## 2016-04-20 NOTE — Therapy (Signed)
[place], [place], [address] Phone: [phone]   Fax:  [phone]  Physical Therapy Treatment  Patient Details  Name: Omar Sanders MRN: [id number] Date of Birth: 03/21/1941 Referring Provider: Tawanna Sat, MD  Encounter Date: 04/20/2016      PT End of Session - 04/20/16 1112    Visit Number 8   Number of Visits 20   Date for PT Re-Evaluation 05/19/16   Authorization Type Medicare UHC   PT Start Time 1101   PT Stop Time [id number]   PT Time Calculation (min) 55 min   Activity Tolerance Patient tolerated treatment well   Behavior During Therapy [facility] for tasks assessed/performed      Past Medical History  Diagnosis Date  . GERD (gastroesophageal reflux disease)   . Hypertension   . Headache(784.0)   . Neuromuscular disorder ([place])     periferal neuropathy per patient  . OSA (obstructive sleep apnea) 11/2009    sleep study with mild OSA, pt unwilling to use CPAP  . Anxiety   . Incontinent of urine     Hx:of  . Hyperlipidemia     Hx: of  . Allergy   . Anemia   . Chronic pain   . Migraines   . Benign prostatic hyperplasia   . IBS (irritable bowel syndrome)   . Hemorrhoids   . Colon polyp   . Anal fissure   . Seizures [facility])     Past Surgical History  Procedure Laterality Date  . Cardiac catheterization  06/04/2009    mild to moderate CAD  w/o hemodynamic significant lesion  . Colonoscopy  2012    Dr Collene Mares  . Insertion of mesh Bilateral 03/11/2013    Procedure: INSERTION OF MESH;  Surgeon: Ralene Ok, MD;  Location: [place];  Service: General;  Laterality: Bilateral;  . Inguinal hernia repair Bilateral 03/11/2013    Procedure: LAPAROSCOPIC BILATERAL INGUINAL HERNIA REPAIR;  Surgeon: Ralene Ok, MD;  Location: [place];  Service: General;  Laterality: Bilateral;  . Cholecystectomy  2008  . US echocardiography  05/27/2009    mild mitral annular ca+,AOV mildly sclerotic  . Nm myocar perf wall  motion  05/27/2009    mild iscemia mid inferio & apical regions.  . Nm myocar perf wall motion  11/01/2012    negative for ischemia  . Hemorrhoid surgery      anal surgery    There were no vitals filed for this visit.      Subjective Assessment - 04/20/16 1111    Subjective Pt reorts he was feeling really good but began having pain down his leg last night again.   Currently in Pain? Yes   Pain Location Buttocks   Pain Orientation Left   Pain Type Chronic pain   Aggravating Factors  waking in the morning   Pain Relieving Factors PT stretches & exercises            [facility] PT Assessment - 04/20/16 0001    Observation/Other Assessments   Focus on Therapeutic Outcomes (FOTO)  46% ability                     [facility] Adult PT Treatment/Exercise - 04/20/16 0001    Lumbar Exercises: Standing   Row 15 reps   Theraband Level (Row) Level 3 (Green)   Shoulder ADduction 15 reps   Theraband Level (Shoulder Adduction) Level 3 (Green)   Shoulder Adduction Limitations RUE only  Lumbar Exercises: Supine   Ab Set 15 reps   AB Set Limitations seated and standing   Lumbar Exercises: Sidelying   Other Sidelying Lumbar Exercises R sidelying over pillows concurrent with education and discussion   Traction   Type of Traction Lumbar   Min (lbs) 20   Max (lbs) 80   Hold Time 60   Rest Time 10   Time 10 min                PT Education - May 03, 2016 1112    Education provided Yes   Education Details exercise form/rationale, HEP   Person(s) Educated Patient   Methods Explanation;Demonstration;Tactile cues;Verbal cues   Comprehension Verbalized understanding;Returned demonstration;Verbal cues required;Tactile cues required;Need further instruction          PT Short Term Goals - 04/07/16 1206    PT SHORT TERM GOAL #1   Title Patient will report centralization of pain from the left leg into the lower back    Time 4   Period Weeks   Status New   PT SHORT TERM GOAL #2    Title Patient will be independent with initial HEP   Time 4   Period Weeks   Status New   PT SHORT TERM GOAL #3   Title Patient will increase gorss left lower extremity strrength to 4+/5    Time 4   Period Weeks   Status New           PT Long Term Goals - 04/04/16 1224    PT LONG TERM GOAL #1   Title He will be independent with all HEP issued    Time 4   Period Weeks   Status On-going   PT LONG TERM GOAL #2   Title He will report pain decreased 50% or more and be itermittant.    Time 4   Period Weeks   Status On-going   PT LONG TERM GOAL #3   Title He will be able to walk without device in and out of home.    Time 4   Period Weeks   Status On-going   PT LONG TERM GOAL #4   Title He will be able to perform transitional movements of sit to stand and supine to sit with 1-2/10 pain max   Time 4   Period Weeks   Status On-going   PT LONG TERM GOAL #5   Title His FOTO score witll decreased < 50% limited to demo functional improvement   Time 4   Period Weeks   Status Unable to assess               Plan - 2016/05/03 1242    Clinical Impression Statement Extensive education provided today, through interpreter, on sleeping posture and anatomy of condition. Attempted training for abdominal bracing in supine and seated today which pt denied increase in pain.    PT Next Visit Plan evaluate effectiveness of traction, piriformis trigger point release, L hip abduction strength.    PT Home Exercise Plan tennis ball in piriformis, R sideyling over pillows    Consulted and Agree with Plan of Care Patient      Patient will benefit from skilled therapeutic intervention in order to improve the following deficits and impairments:     Visit Diagnosis: Bilateral low back pain with left-sided sciatica  Abnormal posture  Cramp and spasm       G-Codes - 03-May-2016 1121    Functional Assessment Tool Used clinical judgment; FOTO  Functional Limitation Other PT primary    Other PT Primary Current Status [id number]) At least 40 percent but less than 60 percent impaired, limited or restricted   Other PT Primary Goal Status [id number]) At least 40 percent but less than 60 percent impaired, limited or restricted      Problem List Patient Active Problem List   Diagnosis Date Noted  . Lightheadedness 02/14/2016  . Chronic kidney disease 01/19/2016  . Dyspnea 11/02/2015  . BPH (benign prostatic hyperplasia) 05/06/2015  . Low back pain with left-sided sciatica 03/15/2015  . Leg swelling 02/01/2015  . Anxiety state 12/29/2014  . Leg pain, bilateral 11/18/2014  . Headache 11/18/2014  . Healthcare maintenance 10/01/2014  . Anemia 10/31/2012  . GERD 05/19/2010  . Irritable bowel syndrome 05/19/2010  . PERSONAL HX COLONIC POLYPS 05/19/2010  . DYSLIPIDEMIA 04/10/2007  . Essential hypertension 04/10/2007  . HIATAL HERNIA 04/10/2007    Jaevian Shean C. Deya Bigos PT, DPT 04/20/2016 12:52 PM   [facility] [address] [place], [place], [address] Phone: [phone]   Fax:  [phone]  Name: Omar Sanders MRN: [id number] Date of Birth: 1941/09/10

## 2016-04-20 NOTE — Patient Instructions (Signed)
  Written in appropriate language by interpreter.

## 2016-04-24 ENCOUNTER — Encounter: Payer: Self-pay | Admitting: Physical Therapy

## 2016-04-24 ENCOUNTER — Ambulatory Visit: Payer: Medicare Other | Admitting: Physical Therapy

## 2016-04-24 DIAGNOSIS — R252 Cramp and spasm: Secondary | ICD-10-CM | POA: Diagnosis not present

## 2016-04-24 DIAGNOSIS — M5442 Lumbago with sciatica, left side: Secondary | ICD-10-CM

## 2016-04-24 DIAGNOSIS — R293 Abnormal posture: Secondary | ICD-10-CM

## 2016-04-24 NOTE — Therapy (Signed)
[place], [place], [address] Phone: [phone]   Fax:  [phone]  Physical Therapy Treatment  Patient Details  Name: Omar Sanders MRN: [id number] Date of Birth: 1941-05-06 Referring Provider: Tawanna Sat, MD  Encounter Date: 04/24/2016      PT End of Session - 04/24/16 1308    Visit Number 9   Number of Visits 20   Date for PT Re-Evaluation 05/19/16   Authorization Type Medicare UHC   PT Start Time 1100   PT Stop Time 1150   PT Time Calculation (min) 50 min   Activity Tolerance Patient tolerated treatment well   Behavior During Therapy [name] for tasks assessed/performed      Past Medical History  Diagnosis Date  . GERD (gastroesophageal reflux disease)   . Hypertension   . Headache(784.0)   . Neuromuscular disorder ([place])     periferal neuropathy per patient  . OSA (obstructive sleep apnea) 11/2009    sleep study with mild OSA, pt unwilling to use CPAP  . Anxiety   . Incontinent of urine     Hx:of  . Hyperlipidemia     Hx: of  . Allergy   . Anemia   . Chronic pain   . Migraines   . Benign prostatic hyperplasia   . IBS (irritable bowel syndrome)   . Hemorrhoids   . Colon polyp   . Anal fissure   . Seizures [facility])     Past Surgical History  Procedure Laterality Date  . Cardiac catheterization  06/04/2009    mild to moderate CAD  w/o hemodynamic significant lesion  . Colonoscopy  2012    Dr Collene Mares  . Insertion of mesh Bilateral 03/11/2013    Procedure: INSERTION OF MESH;  Surgeon: Ralene Ok, MD;  Location: [place];  Service: General;  Laterality: Bilateral;  . Inguinal hernia repair Bilateral 03/11/2013    Procedure: LAPAROSCOPIC BILATERAL INGUINAL HERNIA REPAIR;  Surgeon: Ralene Ok, MD;  Location: [place];  Service: General;  Laterality: Bilateral;  . Cholecystectomy  2008  . US echocardiography  05/27/2009    mild mitral annular ca+,AOV mildly sclerotic  . Nm myocar perf wall  motion  05/27/2009    mild iscemia mid inferio & apical regions.  . Nm myocar perf wall motion  11/01/2012    negative for ischemia  . Hemorrhoid surgery      anal surgery    There were no vitals filed for this visit.      Subjective Assessment - 04/24/16 1105    Subjective Was experiencing increased pain on Saturday but has felt better since and is able to sleep well.    Currently in Pain? Yes   Pain Score 6    Pain Location Back   Pain Orientation Right   Pain Descriptors / Indicators --  stiffness in back   Pain Radiating Towards Lateral L leg            [facility] PT Assessment - 04/24/16 0001    Strength   Left Hip Flexion 4-/5   Left Hip Extension 4-/5   Left Hip ABduction 4/5                     [facility] Adult PT Treatment/Exercise - 04/24/16 0001    Lumbar Exercises: Supine   Ab Set 5 reps   Clam 20 reps   Clam Limitations red Tband   Isometric Hip Flexion 10 reps;3 seconds   Lumbar  Exercises: Sidelying   Clam 10 reps   Clam Limitations right sidelying   Other Sidelying Lumbar Exercises hip reach with slight abd and foot ER x10 (R SL over pillows)   Cryotherapy   Number Minutes Cryotherapy 10 Minutes   Cryotherapy Location Lumbar Spine  in R sidelying over pillows   Type of Cryotherapy Ice pack   Manual Therapy   Joint Mobilization R sidelying over pillows, L lumbar gapping.    Soft tissue mobilization L lumbar paraspinals in R sidelying over pillows   Manual Traction LLE in R sidelying over pillow                PT Education - 04/24/16 1308    Education provided Yes   Education Details exercise form/rationale, lack of progress, HEP   Person(s) Educated Patient  through interpreter   Methods Explanation;Demonstration;Tactile cues;Verbal cues   Comprehension Verbalized understanding;Returned demonstration;Verbal cues required;Tactile cues required;Need further instruction          PT Short Term Goals - 04/24/16 1312    PT SHORT  TERM GOAL #1   Title Patient will report centralization of pain from the left leg into the lower back    Baseline continues to complain of symptoms into L calf   Time 4   Period Weeks   Status On-going   PT SHORT TERM GOAL #2   Title Patient will be independent with initial HEP   Time 4   Period Weeks   Status On-going   PT SHORT TERM GOAL #3   Title Patient will increase gorss left lower extremity strrength to 4+/5    Time 4   Period Weeks   Status On-going           PT Long Term Goals - 04/04/16 1224    PT LONG TERM GOAL #1   Title He will be independent with all HEP issued    Time 4   Period Weeks   Status On-going   PT LONG TERM GOAL #2   Title He will report pain decreased 50% or more and be itermittant.    Time 4   Period Weeks   Status On-going   PT LONG TERM GOAL #3   Title He will be able to walk without device in and out of home.    Time 4   Period Weeks   Status On-going   PT LONG TERM GOAL #4   Title He will be able to perform transitional movements of sit to stand and supine to sit with 1-2/10 pain max   Time 4   Period Weeks   Status On-going   PT LONG TERM GOAL #5   Title His FOTO score witll decreased < 50% limited to demo functional improvement   Time 4   Period Weeks   Status Unable to assess               Plan - 04/24/16 1309    Clinical Impression Statement Pt reports being able to sleep better at night and feels better after therapy but has very little carryover and continues to complain of same symptoms. Only notable difference made in sidelyng over pillows with LLE long axis traction. Pt was instructed to contact MD today to schedule another visit.    PT Treatment/Interventions Electrical Stimulation;Moist Heat;Traction;Therapeutic activities;Therapeutic exercise;Manual techniques;Taping;Dry needling;Passive range of motion;Patient/family education   PT Home Exercise Plan tennis ball in piriformis, R sideyling over pillows     Consulted and Agree with Plan  of Care Patient      Patient will benefit from skilled therapeutic intervention in order to improve the following deficits and impairments:  Decreased activity tolerance, Pain, Increased muscle spasms, Decreased range of motion, Postural dysfunction  Visit Diagnosis: Bilateral low back pain with left-sided sciatica  Abnormal posture  Cramp and spasm     Problem List Patient Active Problem List   Diagnosis Date Noted  . Lightheadedness 02/14/2016  . Chronic kidney disease 01/19/2016  . Dyspnea 11/02/2015  . BPH (benign prostatic hyperplasia) 05/06/2015  . Low back pain with left-sided sciatica 03/15/2015  . Leg swelling 02/01/2015  . Anxiety state 12/29/2014  . Leg pain, bilateral 11/18/2014  . Headache 11/18/2014  . Healthcare maintenance 10/01/2014  . Anemia 10/31/2012  . GERD 05/19/2010  . Irritable bowel syndrome 05/19/2010  . PERSONAL HX COLONIC POLYPS 05/19/2010  . DYSLIPIDEMIA 04/10/2007  . Essential hypertension 04/10/2007  . HIATAL HERNIA 04/10/2007    Aamari West C. Reia Viernes PT, DPT 04/24/2016 1:14 PM   [facility] [address] [place], [place], [address] Phone: [phone]   Fax:  [phone]  Name: Omar Sanders MRN: [id number] Date of Birth: Mar 05, 1941

## 2016-04-27 ENCOUNTER — Ambulatory Visit: Payer: Medicare Other | Admitting: Physical Therapy

## 2016-04-27 DIAGNOSIS — M5442 Lumbago with sciatica, left side: Secondary | ICD-10-CM

## 2016-04-27 DIAGNOSIS — R252 Cramp and spasm: Secondary | ICD-10-CM | POA: Diagnosis not present

## 2016-04-27 DIAGNOSIS — R293 Abnormal posture: Secondary | ICD-10-CM | POA: Diagnosis not present

## 2016-04-27 NOTE — Therapy (Signed)
[place], [place], [address] Phone: [phone]   Fax:  [phone]  Physical Therapy Treatment  Patient Details  Name: Omar Sanders MRN: [id number] Date of Birth: [birth year] Referring Provider: Tawanna Sat, MD  Encounter Date: 04/27/2016      PT End of Session - 04/27/16 1154    Visit Number 10   Number of Visits 20   Date for PT Re-Evaluation 05/19/16   Authorization Type Medicare UHC   PT Start Time 1107   PT Stop Time 1150   PT Time Calculation (min) 43 min   Activity Tolerance Patient tolerated treatment well   Behavior During Therapy [facility] for tasks assessed/performed      Past Medical History  Diagnosis Date  . GERD (gastroesophageal reflux disease)   . Hypertension   . Headache(784.0)   . Neuromuscular disorder ([place])     periferal neuropathy per patient  . OSA (obstructive sleep apnea) 11/2009    sleep study with mild OSA, pt unwilling to use CPAP  . Anxiety   . Incontinent of urine     Hx:of  . Hyperlipidemia     Hx: of  . Allergy   . Anemia   . Chronic pain   . Migraines   . Benign prostatic hyperplasia   . IBS (irritable bowel syndrome)   . Hemorrhoids   . Colon polyp   . Anal fissure   . Seizures [facility])     Past Surgical History  Procedure Laterality Date  . Cardiac catheterization  06/04/2009    mild to moderate CAD  w/o hemodynamic significant lesion  . Colonoscopy  2012    Dr Collene Mares  . Insertion of mesh Bilateral 03/11/2013    Procedure: INSERTION OF MESH;  Surgeon: Ralene Ok, MD;  Location: [place];  Service: General;  Laterality: Bilateral;  . Inguinal hernia repair Bilateral 03/11/2013    Procedure: LAPAROSCOPIC BILATERAL INGUINAL HERNIA REPAIR;  Surgeon: Ralene Ok, MD;  Location: [place];  Service: General;  Laterality: Bilateral;  . Cholecystectomy  2008  . US echocardiography  05/27/2009    mild mitral annular ca+,AOV mildly sclerotic  . Nm myocar perf wall  motion  05/27/2009    mild iscemia mid inferio & apical regions.  . Nm myocar perf wall motion  11/01/2012    negative for ischemia  . Hemorrhoid surgery      anal surgery    There were no vitals filed for this visit.      Subjective Assessment - 04/27/16 1111    Subjective pt reporting increased pain down Left leg from lumbar spine and reporting more pain in the morning.    Patient is accompained by: Interpreter   Pertinent History Chronic back pain with dgenerative changes in lower back.    Limitations Walking;Standing;Sitting   Currently in Pain? Yes   Pain Score 7    Pain Location Back   Pain Orientation Left   Pain Descriptors / Indicators Stabbing;Aching   Pain Type Chronic pain   Pain Radiating Towards Left leg   Pain Frequency Constant   Aggravating Factors  waking up in the morning   Pain Relieving Factors Pt reports feeling better after therapy   Multiple Pain Sites No                         [facility] Adult PT Treatment/Exercise - 04/27/16 0001    Lumbar Exercises: Supine   Clam 20 reps  Clam Limitations red Tband   Bridge 10 reps   Isometric Hip Flexion 10 reps;3 seconds   Lumbar Exercises: Sidelying   Clam 10 reps  2 sets   Clam Limitations right sidelying  added red theraband    Cryotherapy   Number Minutes Cryotherapy 10 Minutes   Cryotherapy Location Lumbar Spine   Type of Cryotherapy Ice pack   Manual Therapy   Joint Mobilization R sidelying over pillows, L lumbar gapping.    Soft tissue mobilization L lumbar paraspinals and L Pirformis and IT band in R sidelying over pillows   Manual Traction LLE in R sidelying over pillow                  PT Short Term Goals - 04/27/16 1159    PT SHORT TERM GOAL #1   Title Patient will report centralization of pain from the left leg into the lower back    Baseline continues to complain of symptoms into L calf   Time 4   Period Weeks   Status On-going   PT SHORT TERM GOAL #2   Title  Patient will be independent with initial HEP   Time 4   Period Weeks   Status On-going   PT SHORT TERM GOAL #3   Title Patient will increase gorss left lower extremity strrength to 4+/5    Time 4   Period Weeks   Status On-going           PT Long Term Goals - 04/04/16 1224    PT LONG TERM GOAL #1   Title He will be independent with all HEP issued    Time 4   Period Weeks   Status On-going   PT LONG TERM GOAL #2   Title He will report pain decreased 50% or more and be itermittant.    Time 4   Period Weeks   Status On-going   PT LONG TERM GOAL #3   Title He will be able to walk without device in and out of home.    Time 4   Period Weeks   Status On-going   PT LONG TERM GOAL #4   Title He will be able to perform transitional movements of sit to stand and supine to sit with 1-2/10 pain max   Time 4   Period Weeks   Status On-going   PT LONG TERM GOAL #5   Title His FOTO score witll decreased < 50% limited to demo functional improvement   Time 4   Period Weeks   Status Unable to assess               Plan - 04/27/16 1155    Clinical Impression Statement Pt reporting still have difficulty when he wakes up every morning. Pt reporting his exercises make his pain better. Pt also reporting pain decreased to 3-4/10 at end of session. Pt reported a visit is scheduled for tomorrow with his PCP.    Rehab Potential Fair   PT Frequency 2x / week   PT Duration 4 weeks   PT Treatment/Interventions Electrical Stimulation;Moist Heat;Traction;Therapeutic activities;Therapeutic exercise;Manual techniques;Taping;Dry needling;Passive range of motion;Patient/family education   PT Next Visit Plan piriformis trigger point release, L hip abduction strengthing   PT Home Exercise Plan tennis ball in piriformis, R sideyling over pillows, Continue previous exercises   Consulted and Agree with Plan of Care Patient      Patient will benefit from skilled therapeutic intervention in order  to improve the  following deficits and impairments:  Decreased activity tolerance, Pain, Increased muscle spasms, Decreased range of motion, Postural dysfunction  Visit Diagnosis: Bilateral low back pain with left-sided sciatica  Abnormal posture  Cramp and spasm     Problem List Patient Active Problem List   Diagnosis Date Noted  . Lightheadedness 02/14/2016  . Chronic kidney disease 01/19/2016  . Dyspnea 11/02/2015  . BPH (benign prostatic hyperplasia) 05/06/2015  . Low back pain with left-sided sciatica 03/15/2015  . Leg swelling 02/01/2015  . Anxiety state 12/29/2014  . Leg pain, bilateral 11/18/2014  . Headache 11/18/2014  . Healthcare maintenance 10/01/2014  . Anemia 10/31/2012  . GERD 05/19/2010  . Irritable bowel syndrome 05/19/2010  . PERSONAL HX COLONIC POLYPS 05/19/2010  . DYSLIPIDEMIA 04/10/2007  . Essential hypertension 04/10/2007  . HIATAL HERNIA 04/10/2007    Oretha Caprice, MPT  04/27/2016, 12:03 PM  [facility] [address] [place], [place], [address] Phone: [phone]   Fax:  [phone]  Name: Omar Sanders MRN: [id number] Date of Birth: 1941-06-30

## 2016-04-28 ENCOUNTER — Telehealth: Payer: Self-pay | Admitting: Family Medicine

## 2016-04-28 ENCOUNTER — Ambulatory Visit (INDEPENDENT_AMBULATORY_CARE_PROVIDER_SITE_OTHER): Payer: Medicare Other | Admitting: Internal Medicine

## 2016-04-28 ENCOUNTER — Encounter: Payer: Self-pay | Admitting: Internal Medicine

## 2016-04-28 VITALS — BP 126/68 | HR 76 | Temp 97.5°F | Wt 182.0 lb

## 2016-04-28 DIAGNOSIS — M5442 Lumbago with sciatica, left side: Secondary | ICD-10-CM

## 2016-04-28 DIAGNOSIS — K581 Irritable bowel syndrome with constipation: Secondary | ICD-10-CM

## 2016-04-28 MED ORDER — LUBIPROSTONE 24 MCG PO CAPS: ORAL_CAPSULE | ORAL | Status: DC

## 2016-04-28 MED ORDER — ALPRAZOLAM 0.5 MG PO TABS: 0.2500 mg | ORAL_TABLET | Freq: Every day | ORAL | Status: DC | PRN

## 2016-04-28 MED ORDER — PANTOPRAZOLE SODIUM 20 MG PO TBEC: 20.0000 mg | DELAYED_RELEASE_TABLET | Freq: Every day | ORAL | Status: DC

## 2016-04-28 NOTE — Telephone Encounter (Signed)
Pt called and really needs to speak to Dr. Avon Gully again about his Protonix. He said that it does not help and if he can not increase the dosage then what is he going to  Do. Please call to discuss. jw

## 2016-04-28 NOTE — Progress Notes (Signed)
   Subjective:    Patient ID: Omar Sanders, male    DOB: [birth year], 75 y.o.   MRN: [id number]  HPI  Patient presents for abdominal and back pain.  Visit conducted with use of video interpreter.   Abdominal pain Patient has history of IBS with chronic constipation, and reports continuation of symptoms. Has been taking Amitiza and Senna PRN, and pantoprazole daily. Was formerly taking omeprazole and maldroxal for GERD as well, but has stopped taking these because he says these meds make him feel warm. Reports improvement of symptoms primarily with Amitiza. Was instructed to begin taking Amitiza daily and stop taking Senna by Dr. [place] Benes at last appointment, but patient has not followed this plan. Denies diarrhea or vomiting. Endorses acid reflux.  Back pain Patient has history of chronic back pain, and has been attending physical therapy sessions (has now attended 10 sessions). He reports that his pain is greatly relieved after physical therapy and he is able to sleep well at night, but still has pain in the morning. Also reports performing stretching and strengthening exercises at home. Takes Tylenol for pain. No numbness, weakness, bowel or bladder incontinence.   Patient is non-smoker.   Review of Systems See HPI.     Objective:   Physical Exam  Constitutional: He is oriented to person, place, and time. He appears well-developed and well-nourished. No distress.  HENT:  Head: Normocephalic and atraumatic.  Abdominal: Soft. Bowel sounds are normal. He exhibits distension (Mild distention). He exhibits no mass. There is no tenderness. There is no rebound and no guarding.  Musculoskeletal:  No spinal tenderness to palpation. No gait abnormalities. Able to stand unassisted.   Neurological: He is alert and oriented to person, place, and time.  Psychiatric: He has a normal mood and affect. His behavior is normal.  Vitals reviewed.     Assessment & Plan:  Irritable bowel syndrome Persistent  symptoms, however patient not following treatment plan outlined at last visit. Patient seems to have difficulty understanding medication regimens, so will simplify med regimen today.  - Amitiza one tablet qd. May take a second tablet if needed for constipation.  - Stop taking Senna - Continue pantoprazole. Patient still endorsing GERD symptoms, however he is at max dose.  - Discontinue omeprazole and maldroxal - If patient still having symptoms when taking Amitiza daily as directed, consider referral to GI (Dr. Collene Mares)  Low back pain with left-sided sciatica Patient currently in physical therapy, with at least 10 additional sessions covered by his insurance. Reports relief with physical therapy.  - Continue physical therapy - Continue home stretching and strengthening exercises - Continue Tylenol PRN   Adin Hector, MD PGY-1 [facility] Pager [phone]

## 2016-04-28 NOTE — Patient Instructions (Addendum)
It was nice meeting you today Mr. Gnagey.   #1. For your stomach pain, take one Amitiza tablet EVERY DAY. If you do not have a bowel movement or are still constipated, you can take a second Amitiza.   #2. Continue to take pantoprazole as you have been.   #3. STOP taking Senna.   If you have any questions, please feel free to call the clinic.   Be well,  Dr. Avon Gully

## 2016-04-28 NOTE — Assessment & Plan Note (Signed)
Persistent symptoms, however patient not following treatment plan outlined at last visit. Patient seems to have difficulty understanding medication regimens, so will simplify med regimen today.  - Amitiza one tablet qd. May take a second tablet if needed for constipation.  - Stop taking Senna - Continue pantoprazole. Patient still endorsing GERD symptoms, however he is at max dose.  - Discontinue omeprazole and maldroxal - If patient still having symptoms when taking Amitiza daily as directed, consider referral to GI (Dr. Collene Mares)

## 2016-04-28 NOTE — Assessment & Plan Note (Signed)
Patient currently in physical therapy, with at least 10 additional sessions covered by his insurance. Reports relief with physical therapy.  - Continue physical therapy - Continue home stretching and strengthening exercises - Continue Tylenol PRN

## 2016-04-28 NOTE — Telephone Encounter (Signed)
Called patient regarding pantoprazole. No answer. Will call again Monday.   Adin Hector, MD PGY-1 [facility] Pager [phone]

## 2016-05-03 ENCOUNTER — Encounter: Payer: Self-pay | Admitting: Physical Therapy

## 2016-05-03 ENCOUNTER — Telehealth: Payer: Self-pay | Admitting: *Deleted

## 2016-05-03 ENCOUNTER — Ambulatory Visit: Payer: Medicare Other | Attending: Family Medicine | Admitting: Physical Therapy

## 2016-05-03 DIAGNOSIS — M5442 Lumbago with sciatica, left side: Secondary | ICD-10-CM

## 2016-05-03 DIAGNOSIS — R293 Abnormal posture: Secondary | ICD-10-CM | POA: Insufficient documentation

## 2016-05-03 DIAGNOSIS — R252 Cramp and spasm: Secondary | ICD-10-CM | POA: Diagnosis not present

## 2016-05-03 NOTE — Telephone Encounter (Signed)
While I have not seen this patient, I have reviewed chart. Patient with ongoing complaints of lower back pain for the past 3 months. X-ray consistent with mild degenerative changes, however due to chronicity of treatment and multiple treatment options explored due feel it is appropriate to refer if patient if without improvement per physical therapy. Therefore, will put in a referral to orthopedics as requested.

## 2016-05-03 NOTE — Therapy (Addendum)
[place], [place], [address] Phone: [phone]   Fax:  [phone]  Physical Therapy Treatment/Discharge  Patient Details  Name: Omar Sanders MRN: [id number] Date of Birth: 12/27/1940 Referring Provider: Tawanna Sat, MD  Encounter Date: 05/03/2016      PT End of Session - 05/03/16 1240    Visit Number 11   Number of Visits 20   Date for PT Re-Evaluation 05/19/16   Authorization Type Medicare UHC   PT Start Time 1103   PT Stop Time 1145   PT Time Calculation (min) 42 min   Activity Tolerance Patient limited by pain   Behavior During Therapy [facility] for tasks assessed/performed      Past Medical History  Diagnosis Date  . GERD (gastroesophageal reflux disease)   . Hypertension   . Headache(784.0)   . Neuromuscular disorder ([place])     periferal neuropathy per patient  . OSA (obstructive sleep apnea) 11/2009    sleep study with mild OSA, pt unwilling to use CPAP  . Anxiety   . Incontinent of urine     Hx:of  . Hyperlipidemia     Hx: of  . Allergy   . Anemia   . Chronic pain   . Migraines   . Benign prostatic hyperplasia   . IBS (irritable bowel syndrome)   . Hemorrhoids   . Colon polyp   . Anal fissure   . Seizures [facility])     Past Surgical History  Procedure Laterality Date  . Cardiac catheterization  06/04/2009    mild to moderate CAD  w/o hemodynamic significant lesion  . Colonoscopy  2012    Dr Collene Mares  . Insertion of mesh Bilateral 03/11/2013    Procedure: INSERTION OF MESH;  Surgeon: Ralene Ok, MD;  Location: [place];  Service: General;  Laterality: Bilateral;  . Inguinal hernia repair Bilateral 03/11/2013    Procedure: LAPAROSCOPIC BILATERAL INGUINAL HERNIA REPAIR;  Surgeon: Ralene Ok, MD;  Location: [place];  Service: General;  Laterality: Bilateral;  . Cholecystectomy  2008  . US echocardiography  05/27/2009    mild mitral annular ca+,AOV mildly sclerotic  . Nm myocar perf wall  motion  05/27/2009    mild iscemia mid inferio & apical regions.  . Nm myocar perf wall motion  11/01/2012    negative for ischemia  . Hemorrhoid surgery      anal surgery    There were no vitals filed for this visit.      Subjective Assessment - 05/03/16 1103    Subjective Pt reports pain is noticable in the morning but gets better as he moves around. Reports he feels good after PT but the next day it is the same.    Patient Stated Goals Ease the pain    Currently in Pain? Yes   Pain Score 6    Pain Location Buttocks   Pain Orientation Left   Aggravating Factors  waking in the morning, unable to tell what makes in worse   Pain Relieving Factors exercises            [facility] PT Assessment - 05/03/16 0001    Observation/Other Assessments   Focus on Therapeutic Outcomes (FOTO)  45% ability  frustrated with answer choices-saying each depends on situat   Strength   Left Hip Flexion 4/5   Left Hip Extension 4/5   Left Hip ABduction 4/5  [facility] Adult PT Treatment/Exercise - 05/03/16 0001    Lumbar Exercises: Stretches   Active Hamstring Stretch Other (comment)  x10 ea   Lumbar Exercises: Supine   Clam 20 reps   Clam Limitations red Tband   Bridge 20 reps   Isometric Hip Flexion 10 reps;3 seconds   Manual Therapy   Soft tissue mobilization L hip musculature trigger point release- piriformis and QL                PT Education - 05/03/16 1302    Education provided Yes   Education Details lack of carryover, exercise form/rationale   Person(s) Educated Patient  through interpreter   Methods Explanation;Demonstration;Tactile cues;Verbal cues   Comprehension Verbalized understanding;Returned demonstration;Verbal cues required;Tactile cues required          PT Short Term Goals - 05/03/16 1118    PT SHORT TERM GOAL #1   Title Patient will report centralization of pain from the left leg into the lower back    Baseline occasional pain  into L calf   Time 4   Period Weeks   Status On-going   PT SHORT TERM GOAL #2   Title Patient will be independent with initial HEP   Status Achieved   PT SHORT TERM GOAL #3   Title Patient will increase gorss left lower extremity strrength to 4+/5    Baseline gross 4/5   Time 4   Period Weeks   Status On-going           PT Long Term Goals - 05/03/16 1113    PT LONG TERM GOAL #1   Title He will be independent with all HEP issued    Time 4   Period Weeks   Status Achieved   PT LONG TERM GOAL #2   Title He will report pain decreased 50% or more and be itermittant.    Baseline used to be 7-8/10; now sometimes 4-6/10   Time 4   Period Weeks   Status On-going   PT LONG TERM GOAL #3   Title He will be able to walk without device in and out of home.    Time 4   Period Weeks   Status Achieved   PT LONG TERM GOAL #4   Title He will be able to perform transitional movements of sit to stand and supine to sit with 1-2/10 pain max   Baseline 6/10   Time 4   Period Weeks   Status On-going   PT LONG TERM GOAL #5   Title His FOTO score witll decreased < 50% limited to demo functional improvement   Baseline 55% limited   Time 4   Period Weeks   Status Unable to assess               Plan - 05/03/16 1126    Clinical Impression Statement Pt was unable to be in sidelying for clams due to R-sided back pain today. Pt has made some progress with pain levels and strength but continues to complain of concordant pain pattern in the mornings that is decreased with exercises; later in the appointment he said that only coming to PT helped but exercises at home do work some. It was difficult to communicate the lack of carryover even though he feels better in the short term. Discussed PT contacting PCP regarding referral to orthopedist.     PT Next Visit Plan piriformis trigger point release, L hip abduction strengthing, f/u regarding discussion with PCP  PT Home Exercise Plan tennis  ball in piriformis, R sideyling over pillows, Continue previous exercises   Consulted and Agree with Plan of Care Patient      Patient will benefit from skilled therapeutic intervention in order to improve the following deficits and impairments:     Visit Diagnosis: Bilateral low back pain with left-sided sciatica  Abnormal posture  Cramp and spasm     Problem List Patient Active Problem List   Diagnosis Date Noted  . Lightheadedness 02/14/2016  . Chronic kidney disease 01/19/2016  . Dyspnea 11/02/2015  . BPH (benign prostatic hyperplasia) 05/06/2015  . Low back pain with left-sided sciatica 03/15/2015  . Leg swelling 02/01/2015  . Anxiety state 12/29/2014  . Leg pain, bilateral 11/18/2014  . Headache 11/18/2014  . Healthcare maintenance 10/01/2014  . Anemia 10/31/2012  . GERD 05/19/2010  . Irritable bowel syndrome 05/19/2010  . PERSONAL HX COLONIC POLYPS 05/19/2010  . DYSLIPIDEMIA 04/10/2007  . Essential hypertension 04/10/2007  . HIATAL HERNIA 04/10/2007    Corneshia Hines C. Miho Monda PT, DPT 05/03/2016 1:08 PM   [facility] [address] [place], [place], [address] Phone: [phone]   Fax:  [phone]  Name: Omar Sanders MRN: [id number] Date of Birth: July 15, 1941  PHYSICAL THERAPY DISCHARGE SUMMARY  Visits from Start of Care: 11  Current functional level related to goals / functional outcomes: See above   Remaining deficits: See above   Education / Equipment: See above Plan:                                                    Patient goals were partially met. Patient is being discharged due to not returning since the last visit.  ?????   Lillette Boxer Chasse  PT      09/12/16   2:23 PM

## 2016-05-03 NOTE — Telephone Encounter (Signed)
Selinda Eon, Physical Therapist with [facility] called to request referral to orthopedic doctor.  Patient has plateau with physical therapy at this point.  Please give her a call with questions at [phone].  Derl Barrow, RN

## 2016-05-05 ENCOUNTER — Telehealth: Payer: Self-pay | Admitting: Internal Medicine

## 2016-05-05 NOTE — Telephone Encounter (Signed)
Patient indicates having side effects to Amitiza and pantoprazole. Side effects include headache, dizziness, stomach pain, feeling feverish. Patient states he has been having these symptoms prior to last visit on June 30 (started week before June 30th ). States that every morning, he takes Amitiza and then pantoprazole and then he has symptoms described above. Advised patient to stop the medication and follow up at the [facility] to determine next steps for irritable bowel disease.

## 2016-05-05 NOTE — Telephone Encounter (Signed)
Pt is calling to speak only to the doctor. He has some questions that only she can answer. jw

## 2016-05-09 ENCOUNTER — Ambulatory Visit: Payer: Medicare Other | Admitting: Physical Therapy

## 2016-05-09 ENCOUNTER — Encounter: Payer: Self-pay | Admitting: Physical Therapy

## 2016-05-09 DIAGNOSIS — R252 Cramp and spasm: Secondary | ICD-10-CM | POA: Diagnosis not present

## 2016-05-09 DIAGNOSIS — M5442 Lumbago with sciatica, left side: Secondary | ICD-10-CM | POA: Diagnosis not present

## 2016-05-09 DIAGNOSIS — R293 Abnormal posture: Secondary | ICD-10-CM

## 2016-05-09 NOTE — Patient Instructions (Signed)
Access Code: [email or web address]  URL: [email or web address]  Date: 05/09/2016  Prepared by: Selinda Eon   Exercises  Supine Active Hamstring Stretch - 10 reps - 1 sets - 2 hold - 2x daily - 7x weekly  Supine Piriformis Stretch with Foot on Ground - 3 reps - 1 sets - 30s hold - 2x daily - 7x weekly

## 2016-05-09 NOTE — Therapy (Signed)
[place], [place], [address] Phone: [phone]   Fax:  [phone]  Physical Therapy Treatment/Discharge Summary  Patient Details  Name: LENNIN OSMOND MRN: [id number] Date of Birth: 30-Nov-1940 Referring Provider: Tawanna Sat, MD  Encounter Date: 05/09/2016      PT End of Session - 05/09/16 1218    Visit Number 12   Number of Visits 20   Date for PT Re-Evaluation 05/19/16   Authorization Type Medicare UHC   PT Start Time 1100   PT Stop Time 1158   PT Time Calculation (min) 58 min   Activity Tolerance Patient tolerated treatment well   Behavior During Therapy [facility] for tasks assessed/performed      Past Medical History  Diagnosis Date  . GERD (gastroesophageal reflux disease)   . Hypertension   . Headache(784.0)   . Neuromuscular disorder ([place])     periferal neuropathy per patient  . OSA (obstructive sleep apnea) 11/2009    sleep study with mild OSA, pt unwilling to use CPAP  . Anxiety   . Incontinent of urine     Hx:of  . Hyperlipidemia     Hx: of  . Allergy   . Anemia   . Chronic pain   . Migraines   . Benign prostatic hyperplasia   . IBS (irritable bowel syndrome)   . Hemorrhoids   . Colon polyp   . Anal fissure   . Seizures [facility])     Past Surgical History  Procedure Laterality Date  . Cardiac catheterization  06/04/2009    mild to moderate CAD  w/o hemodynamic significant lesion  . Colonoscopy  2012    Dr Collene Mares  . Insertion of mesh Bilateral 03/11/2013    Procedure: INSERTION OF MESH;  Surgeon: Ralene Ok, MD;  Location: [employer];  Service: General;  Laterality: Bilateral;  . Inguinal hernia repair Bilateral 03/11/2013    Procedure: LAPAROSCOPIC BILATERAL INGUINAL HERNIA REPAIR;  Surgeon: Ralene Ok, MD;  Location: [place];  Service: General;  Laterality: Bilateral;  . Cholecystectomy  2008  . US echocardiography  05/27/2009    mild mitral annular ca+,AOV mildly sclerotic  . Nm  myocar perf wall motion  05/27/2009    mild iscemia mid inferio & apical regions.  . Nm myocar perf wall motion  11/01/2012    negative for ischemia  . Hemorrhoid surgery      anal surgery    There were no vitals filed for this visit.      Subjective Assessment - 05/09/16 1104    Subjective Pt reports he is not as disturbed by pain during daily activities. Feels better as he is up and around, continues to feel pain in the morning. When asked to show where pain is, he pointed to piriformis area. Reports visit with orthopedic MD on 7/18   Patient is accompained by: Interpreter   Currently in Pain? Yes   Pain Score 6    Pain Location Buttocks   Pain Orientation Left   Pain Descriptors / Indicators Sharp   Pain Type Chronic pain   Aggravating Factors  waking after sleeping, otherwise unsure   Pain Relieving Factors moving around and doing exercises                         [facility] Adult PT Treatment/Exercise - 05/09/16 0001    Lumbar Exercises: Stretches   Active Hamstring Stretch Other (comment)  x15 L   Piriformis  Stretch 2 reps;30 seconds   Lumbar Exercises: Supine   Ab Set 20 reps   Clam 20 reps   Clam Limitations red Tband   Large Ball Oblique Isometric Limitations L arm to R leg   Other Supine Lumbar Exercises supine marches with abdominal bracing x10 ea; GHJ flexed   Moist Heat Therapy   Number Minutes Moist Heat 10 Minutes   Moist Heat Location Lumbar Spine  in R sidelying over pillows   Manual Therapy   Joint Mobilization R sidelying over pillows, L lumbar gapping.    Soft tissue mobilization L hip musculature trigger point release- piriformis and QL                PT Education - 05/09/16 1218    Education provided Yes   Education Details exercise form/rationale, POC   Person(s) Educated Patient  interpreter   Methods Explanation;Demonstration;Tactile cues;Verbal cues;Handout   Comprehension Verbalized understanding;Returned  demonstration;Verbal cues required;Tactile cues required          PT Short Term Goals - 05/03/16 1118    PT SHORT TERM GOAL #1   Title Patient will report centralization of pain from the left leg into the lower back    Baseline occasional pain into L calf   Time 4   Period Weeks   Status On-going   PT SHORT TERM GOAL #2   Title Patient will be independent with initial HEP   Status Achieved   PT SHORT TERM GOAL #3   Title Patient will increase gorss left lower extremity strrength to 4+/5    Baseline gross 4/5   Time 4   Period Weeks   Status On-going           PT Long Term Goals - 05/03/16 1113    PT LONG TERM GOAL #1   Title He will be independent with all HEP issued    Time 4   Period Weeks   Status Achieved   PT LONG TERM GOAL #2   Title He will report pain decreased 50% or more and be itermittant.    Baseline used to be 7-8/10; now sometimes 4-6/10   Time 4   Period Weeks   Status On-going   PT LONG TERM GOAL #3   Title He will be able to walk without device in and out of home.    Time 4   Period Weeks   Status Achieved   PT LONG TERM GOAL #4   Title He will be able to perform transitional movements of sit to stand and supine to sit with 1-2/10 pain max   Baseline 6/10   Time 4   Period Weeks   Status On-going   PT LONG TERM GOAL #5   Title His FOTO score witll decreased < 50% limited to demo functional improvement   Baseline 55% limited   Time 4   Period Weeks   Status Unable to assess               Plan - 05/09/16 1219    Clinical Impression Statement Due to lack of carryover, pt will be d/c at this time to independent program until MD visit; extensive discussion was held with pt and interpreter to be sure that all of the pt questions were answered and he understood his POC. Pt reports the exercises he is doing are very helpful and he is able to function better in the community but has the same pain when he wakes in the morning.  Pt verbalized  understanidng of HEP and POC. Was instructed to contact us with any further questions.    PT Home Exercise Plan tennis ball in piriformis, R sideyling over pillows, piriformis and active hamstring stretch   Consulted and Agree with Plan of Care Patient      Patient will benefit from skilled therapeutic intervention in order to improve the following deficits and impairments:     Visit Diagnosis: Bilateral low back pain with left-sided sciatica  Abnormal posture       G-Codes - 2016/05/10 1222    Functional Assessment Tool Used clinical judgment; FOTO 55% impaired   Functional Limitation Other PT primary   Other PT Primary Current Status (S3159) At least 40 percent but less than 60 percent impaired, limited or restricted   Other PT Primary Goal Status (Y5859) At least 40 percent but less than 60 percent impaired, limited or restricted   Other PT Primary Discharge Status (Y9244) At least 40 percent but less than 60 percent impaired, limited or restricted      Problem List Patient Active Problem List   Diagnosis Date Noted  . Lightheadedness 02/14/2016  . Chronic kidney disease 01/19/2016  . Dyspnea 11/02/2015  . BPH (benign prostatic hyperplasia) 05/06/2015  . Low back pain with left-sided sciatica 03/15/2015  . Leg swelling 02/01/2015  . Anxiety state 12/29/2014  . Leg pain, bilateral 11/18/2014  . Headache 11/18/2014  . Healthcare maintenance 10/01/2014  . Anemia 10/31/2012  . GERD 05/19/2010  . Irritable bowel syndrome 05/19/2010  . PERSONAL HX COLONIC POLYPS 05/19/2010  . DYSLIPIDEMIA 04/10/2007  . Essential hypertension 04/10/2007  . HIATAL HERNIA 04/10/2007   PHYSICAL THERAPY DISCHARGE SUMMARY  Visits from Start of Care: 12  Current functional level related to goals / functional outcomes: See above   Remaining deficits: See above   Education / Equipment: Exercise form/rationale, anatomy of condition, POC, HEP  Plan: Patient agrees to discharge.  Patient  goals were partially met. Patient is being discharged due to lack of progress.  ?????       Courage Biglow C. Atisha Hamidi PT, DPT 05/10/2016 12:25 PM   [facility] [address] [place], [place], [address] Phone: [phone]   Fax:  [phone]  Name: ELLIOT MELDRUM MRN: [id number] Date of Birth: 1941/08/30

## 2016-05-12 ENCOUNTER — Telehealth: Payer: Self-pay | Admitting: *Deleted

## 2016-05-12 ENCOUNTER — Encounter: Payer: Self-pay | Admitting: Internal Medicine

## 2016-05-12 ENCOUNTER — Ambulatory Visit (INDEPENDENT_AMBULATORY_CARE_PROVIDER_SITE_OTHER): Payer: Medicare Other | Admitting: Internal Medicine

## 2016-05-12 VITALS — BP 111/63 | HR 70 | Temp 97.5°F | Wt 182.0 lb

## 2016-05-12 DIAGNOSIS — K581 Irritable bowel syndrome with constipation: Secondary | ICD-10-CM

## 2016-05-12 MED ORDER — OMEPRAZOLE 20 MG PO CPDR: 20.0000 mg | DELAYED_RELEASE_CAPSULE | Freq: Every day | ORAL | Status: DC

## 2016-05-12 MED ORDER — OMEPRAZOLE 20 MG PO CPDR
DELAYED_RELEASE_CAPSULE | ORAL | Status: DC
Start: 2016-05-12 — End: 2016-05-15

## 2016-05-12 MED ORDER — DOCUSATE SODIUM 50 MG PO CAPS: 50.0000 mg | ORAL_CAPSULE | Freq: Two times a day (BID) | ORAL | Status: DC

## 2016-05-12 NOTE — Progress Notes (Signed)
   [facility] Kerrin Mo, MD Phone: [phone]  Reason For Visit: F/U for IBS, Same day.   # Patient is not currently using Amitiza or protonix. These medicines are now not helping. Patient complains of having side effects associated with these medications. Patient states they give him hot flashes, cough, and headache. Patient is no longer taking these medications, and is no longer having the side effects. Patient has been seen in the past by [facility] about 6 months ago. Currently patient indicates his IBS (constipation) is well controlled with docusate. Denies any abdominal complaints. No vomiting/nasuea. Patient states he is currently having reflux since stopping medications.   Past Medical History Reviewed problem list.  Medications- reviewed and updated No additions to family history Social history- patient is a non-smoker  Objective: BP 111/63 mmHg  Pulse 70  Temp(Src) 97.5 F (36.4 C) (Oral)  Wt 182 lb (82.555 kg)  SpO2 96% Gen: NAD, alert, cooperative with exam CV: RRR, good S1/S2, no murmur Resp: CTABL, no wheezes, non-labored Abd: SNTND, BS present, no guarding or organomegaly  Assessment/Plan: See problem based a/p  Irritable bowel syndrome Indicates Lubiprostone and Pantoprazole are causing side effects of hot flashes, cough, and headache that have now resolved since stopping this medication. Patient with no constipation issues. - Continue Docusate as needed for constipation  - Explained to patient that reflux could likely be due to patient stopping PPI, as patient has been on this medication for 5 years. Will start Prilosec and will provide two weeks of PPI daily, followed by every other day for two weeks, then patient instructed to stop. Will give me a call for prescription of ranitidine in patient has issues with reflux. - Stopping PPI due to patient's age and duration.

## 2016-05-12 NOTE — Assessment & Plan Note (Addendum)
Indicates Lubiprostone and Pantoprazole are causing side effects of hot flashes, cough, and headache that have now resolved since stopping this medication. Patient with no constipation issues. - Stopp Lubiprostone due to side effects.  - Continue Docusate as needed for constipation  - Explained to patient that reflux could likely be due to patient stopping PPI, as patient has been on this medication for 5 years. Will start Prilosec and will provide two weeks of PPI daily, followed by every other day for two weeks, then patient instructed to stop. Will give me a call for prescription of ranitidine in patient has issues with reflux. - Stopping PPI due to patient's age and duration.

## 2016-05-12 NOTE — Patient Instructions (Addendum)
I want you stop taking Amitiza as this is causing side effects. I want to take Prilosec every day for two weeks, and then every other day for twos. Then I want you to stop this medication. Continue docusate as needed for constipation. If you are still having cough after reflux is resolved. Please follow up with me.

## 2016-05-12 NOTE — Telephone Encounter (Signed)
Patient was at the pharmacy reporting that he can take omeprazole or pantoprazole.  Please send in a new Rx to [facility]. Derl Barrow, RN

## 2016-05-15 ENCOUNTER — Telehealth: Payer: Self-pay | Admitting: *Deleted

## 2016-05-15 MED ORDER — OMEPRAZOLE 20 MG PO CPDR: DELAYED_RELEASE_CAPSULE | ORAL | Status: DC

## 2016-05-15 NOTE — Telephone Encounter (Signed)
Pt states that the propantazole was changed to omeprazole by Dr. Lamar Benes but now neither medication is not helping his stomach issues.  Wants to know if there is something else that can be sent in. Omar Sanders, Salome Spotted, CMA

## 2016-05-15 NOTE — Telephone Encounter (Signed)
I resent Prilosec prescription. Per patient he has an allergic reaction to pantoprazole. Ultimately I would like him to stop this PPI slowly over the next month and only use Ranitidine as needed for reflux.

## 2016-05-16 ENCOUNTER — Encounter: Payer: Medicare Other | Admitting: Physical Therapy

## 2016-05-16 DIAGNOSIS — M5442 Lumbago with sciatica, left side: Secondary | ICD-10-CM | POA: Diagnosis not present

## 2016-05-16 MED ORDER — ESOMEPRAZOLE MAGNESIUM 20 MG PO CPDR: DELAYED_RELEASE_CAPSULE | ORAL | Status: DC

## 2016-05-16 NOTE — Telephone Encounter (Signed)
I have sent in a different PPI for patient to try. Please let patient know that I have prescribed a completely different medication which he has not tried in the past and to let me know if there are any issues with this medication. He can also take over-counter anti-acids for immediately relief as the PPI might take a couple of days to work effectively.

## 2016-05-18 ENCOUNTER — Encounter: Payer: Medicare Other | Admitting: Physical Therapy

## 2016-05-23 ENCOUNTER — Encounter: Payer: Medicare Other | Admitting: Physical Therapy

## 2016-05-25 DIAGNOSIS — M5442 Lumbago with sciatica, left side: Secondary | ICD-10-CM | POA: Diagnosis not present

## 2016-05-30 ENCOUNTER — Encounter: Payer: Medicare Other | Admitting: Physical Therapy

## 2016-06-01 ENCOUNTER — Encounter: Payer: Medicare Other | Admitting: Physical Therapy

## 2016-06-05 DIAGNOSIS — M5442 Lumbago with sciatica, left side: Secondary | ICD-10-CM | POA: Diagnosis not present

## 2016-06-07 ENCOUNTER — Telehealth: Payer: Self-pay | Admitting: Cardiovascular Disease

## 2016-06-07 NOTE — Telephone Encounter (Signed)
Received records from [place] for appointment on 06/14/16 with Dr Gwenlyn Found.  Records given to [facility] (medical records) for Dr Kennon Holter schedule on 06/14/16. lp

## 2016-06-09 ENCOUNTER — Ambulatory Visit (INDEPENDENT_AMBULATORY_CARE_PROVIDER_SITE_OTHER): Payer: Medicare Other | Admitting: Internal Medicine

## 2016-06-09 VITALS — BP 118/68 | HR 88 | Temp 97.6°F | Ht 67.0 in | Wt 182.0 lb

## 2016-06-09 DIAGNOSIS — K219 Gastro-esophageal reflux disease without esophagitis: Secondary | ICD-10-CM

## 2016-06-09 DIAGNOSIS — K589 Irritable bowel syndrome without diarrhea: Secondary | ICD-10-CM

## 2016-06-09 MED ORDER — RANITIDINE HCL 150 MG PO TABS: 150.0000 mg | ORAL_TABLET | Freq: Two times a day (BID) | ORAL | Status: DC

## 2016-06-09 MED ORDER — LUBIPROSTONE 8 MCG PO CAPS: 8.0000 ug | ORAL_CAPSULE | Freq: Two times a day (BID) | ORAL | 1 refills | Status: DC

## 2016-06-09 MED ORDER — RANITIDINE HCL 150 MG PO CAPS: 150.0000 mg | ORAL_CAPSULE | Freq: Two times a day (BID) | ORAL | 3 refills | Status: DC

## 2016-06-09 MED ORDER — POLYETHYLENE GLYCOL 3350 17 GM/SCOOP PO POWD: 17.0000 g | Freq: Two times a day (BID) | ORAL | 1 refills | Status: DC | PRN

## 2016-06-09 NOTE — Patient Instructions (Addendum)
  To help with constipation I am going to give some foods that would help with constipation  - High fiber foods  - 1/2 cup navy beans: 9.5 grams 1 small pear: 4.4 grams 1/4 cup dates: 3.6 grams 1 medium apple: 3.3 grams 1 medium sweet potato: 4.8 grams  Medications for constipation include: Amitizal, colace and Miralax  ReFlux take Ranitidine as needed   I want you to make an appointment to get a colonoscopy   Follow up with me two months

## 2016-06-09 NOTE — Progress Notes (Signed)
   [facility] Kerrin Mo, MD Phone: [phone]  Reason For Visit: Follow up for Constipation.  # Constipation for about 1 month. Amitiza has helped. Previously, patient did not want to be on Amitiza due to side effects. However, now he states he felt that it worked quiet well and would like to try this again.  Has also been taking docusate. Patient is still using Amitiza and is currently having a daily bowel movement. Takes patient time to have a bowel movement. Sometimes has pain with bowel movement. No blood in bowel movement.  Stools are hard. Patient feels bloated.   # Reflux: Patient states that he has hard time digesting food. Patient has a burning sensation in esophagus and stomach once or twice a week.  Medication: Previously on  Nexium, weaned off over the past month. Does not have any burning sensation.  Would like a prn medication for reflux.   Past Medical History Reviewed problem list.  Medications- reviewed and updated No additions to family history Social history- patient is a non smoker  Objective: BP 118/68 (BP Location: Right Arm, Patient Position: Sitting, Cuff Size: Normal)   Pulse 88   Temp 97.6 F (36.4 C) (Oral)   Ht 5\' 7"  (1.702 m)   Wt 182 lb (82.6 kg)   BMI 28.51 kg/m  Gen: NAD, alert, cooperative with exam GI: soft, non-tender, non-distended, bowel sounds present, no hepatomegaly, no splenomegaly  Assessment/Plan: See problem based a/p  GERD PRN reflux, stopped PPI as patient should not be on longer term use. Previously on for several years  - Ranitidine BID as needed for reflux   IBS (irritable bowel syndrome) Long time hx of IBS, currently constipation. History of colonoscopy in 2012, with findings of small sessile right polyp follow-up included 5 years or sooner in the case of any abnormal GI symptoms. -Patient is due for a another colonoscopy this year, seems appropriate given patient continues to have trouble with  constipation  - Refill Lubiprostone, obviously patient stated that he had side effects to this medication. However now would like to retry this medication as he states it worked well for his constipation.  - Will included Miralax and Docusate PRN for further constipation control  - Provided fiber rich foods for patient to try to help with constipation

## 2016-06-12 ENCOUNTER — Encounter: Payer: Self-pay | Admitting: Internal Medicine

## 2016-06-12 NOTE — Assessment & Plan Note (Signed)
PRN reflux, stopped PPI as patient should not be on longer term use. Previously on for several years  - Ranitidine BID as needed for reflux

## 2016-06-12 NOTE — Assessment & Plan Note (Signed)
Long time hx of IBS, currently constipation. History of colonoscopy in 2012, with findings of small sessile right polyp follow-up included 5 years or sooner in the case of any abnormal GI symptoms. -Patient is due for a another colonoscopy this year, seems appropriate given patient continues to have trouble with constipation  - Refill Lubiprostone, obviously patient stated that he had side effects to this medication. However now would like to retry this medication as he states it worked well for his constipation.  - Will included Miralax and Docusate PRN for further constipation control  - Provided fiber rich foods for patient to try to help with constipation

## 2016-06-14 ENCOUNTER — Ambulatory Visit (INDEPENDENT_AMBULATORY_CARE_PROVIDER_SITE_OTHER): Payer: Medicare Other | Admitting: Cardiovascular Disease

## 2016-06-14 ENCOUNTER — Encounter: Payer: Self-pay | Admitting: Cardiovascular Disease

## 2016-06-14 VITALS — BP 118/74 | HR 63 | Ht 67.0 in | Wt 182.6 lb

## 2016-06-14 DIAGNOSIS — I10 Essential (primary) hypertension: Secondary | ICD-10-CM | POA: Diagnosis not present

## 2016-06-14 DIAGNOSIS — Z01810 Encounter for preprocedural cardiovascular examination: Secondary | ICD-10-CM

## 2016-06-14 DIAGNOSIS — R079 Chest pain, unspecified: Secondary | ICD-10-CM

## 2016-06-14 DIAGNOSIS — E785 Hyperlipidemia, unspecified: Secondary | ICD-10-CM | POA: Insufficient documentation

## 2016-06-14 DIAGNOSIS — Z01818 Encounter for other preprocedural examination: Secondary | ICD-10-CM | POA: Diagnosis not present

## 2016-06-14 DIAGNOSIS — E1169 Type 2 diabetes mellitus with other specified complication: Secondary | ICD-10-CM | POA: Insufficient documentation

## 2016-06-14 DIAGNOSIS — R0789 Other chest pain: Secondary | ICD-10-CM

## 2016-06-14 NOTE — Assessment & Plan Note (Signed)
History of hyperlipidemia currently not on statin therapy with his most recent lipid profile performed 07/12/15 revealing total showed 211, LDL 124 and L HDL of 30. This is followed by his PCP

## 2016-06-14 NOTE — Progress Notes (Signed)
06/14/2016 Omar Sanders   1940-11-08  [id number]  Primary Physician Asiyah Criss Rosales, MD Primary Cardiologist: Lorretta Harp MD Renae Gloss  HPI:  Mr Omar Sanders is a 75 year old widowed [place]  father of 4 adult children who was referred by Dr. Gladstone Lighter for cardiovascular clearance before elective lumbar decompressive laminectomy. He has a history of hypertension or hyperlipidemia. Apparently he had a nuclear stress test back in 2010 which led to a cardiac catheterization 06/04/09 revealing minimal CAD.  He Does get occasional atypical chest pain.   Current Outpatient Prescriptions  Medication Sig Dispense Refill  . ALPRAZolam (XANAX) 0.5 MG tablet Take 0.5 tablets (0.25 mg total) by mouth daily as needed for anxiety. 15 tablet 1  . docusate sodium (COLACE) 50 MG capsule Take 1 capsule (50 mg total) by mouth 2 (two) times daily. 10 capsule 0  . finasteride (PROSCAR) 5 MG tablet Take 1 tablet (5 mg total) by mouth daily. 30 tablet 5  . Fish Oil-Cholecalciferol (OMEGA-3 FISH OIL/VITAMIN D3) 1000-1000 MG-UNIT CAPS Take 1 capsule by mouth daily. 90 capsule 3  . hydrocortisone (ANUSOL-HC) 2.5 % rectal cream Place 1 application rectally 2 (two) times daily. 30 g 1  . lisinopril (PRINIVIL,ZESTRIL) 20 MG tablet Take 1 tablet (20 mg total) by mouth daily. 90 tablet 3  . lubiprostone (AMITIZA) 8 MCG capsule Take 1 capsule (8 mcg total) by mouth 2 (two) times daily with a meal. 30 capsule 1  . polyethylene glycol powder (GLYCOLAX/MIRALAX) powder Take 17 g by mouth 2 (two) times daily as needed. 3350 g 1  . ranitidine (ZANTAC) 150 MG capsule Take 1 capsule (150 mg total) by mouth 2 (two) times daily. 30 capsule 3   No current facility-administered medications for this visit.     Allergies  Allergen Reactions  . Aspirin     Unknown  . Lubiprostone   . Pantoprazole   . Vitamin C     Unknown    Social History   Social History  . Marital status: Widowed    Spouse name: N/A  .  Number of children: 5  . Years of education: N/A   Occupational History  . Not on file.   Social History Main Topics  . Smoking status: Never Smoker  . Smokeless tobacco: Never Used  . Alcohol use No  . Drug use: No  . Sexual activity: Not Currently   Other Topics Concern  . Not on file   Social History Narrative  . No narrative on file     Review of Systems: General: negative for chills, fever, night sweats or weight changes.  Cardiovascular: negative for chest pain, dyspnea on exertion, edema, orthopnea, palpitations, paroxysmal nocturnal dyspnea or shortness of breath Dermatological: negative for rash Respiratory: negative for cough or wheezing Urologic: negative for hematuria Abdominal: negative for nausea, vomiting, diarrhea, bright red blood per rectum, melena, or hematemesis Neurologic: negative for visual changes, syncope, or dizziness All other systems reviewed and are otherwise negative except as noted above.    Blood pressure 118/74, pulse 63, height 5\' 7"  (1.702 m), weight 182 lb 9.6 oz (82.8 kg).  General appearance: alert and no distress Neck: no adenopathy, no carotid bruit, no JVD, supple, symmetrical, trachea midline and thyroid not enlarged, symmetric, no tenderness/mass/nodules Lungs: clear to auscultation bilaterally Heart: regular rate and rhythm, S1, S2 normal, no murmur, click, rub or gallop Extremities: extremities normal, atraumatic, no cyanosis or edema  EKG normal sinus rhythm at 63 with  ST or T-wave changes. I personally reviewed this EKG  ASSESSMENT AND PLAN:   Essential hypertension History of hypertension blood pressure measures 118/74. He is on lisinopril. Continue current meds at current dosing  Hyperlipidemia History of hyperlipidemia currently not on statin therapy with his most recent lipid profile performed 07/12/15 revealing total showed 211, LDL 124 and L HDL of 30. This is followed by his PCP  Atypical chest pain History of  atypical chest pain with cardiac catheterization performed by Dr. Dellis Anes  6/10 because of an abnormal Myoview stress test that showed minimal CAD. He is scheduled for decompressive laminectomy by Dr. Gladstone Lighter and was sent here for preoperative clearance. We will get a pharmacologic Myoview stress test to risk stratify him.      Lorretta Harp MD FACP,FACC,FAHA, [facility] 06/14/2016 10:30 AM

## 2016-06-14 NOTE — Patient Instructions (Signed)
Testing/Procedures: Your physician has requested that you have a lexiscan myoview. For further information please visit [email or web address]. Please follow instruction sheet, as given.    Follow-Up: Your physician recommends that you schedule a follow-up appointment on an as needed basis.   Any Other Special Instructions Will Be Listed Below (If Applicable).  Pharmacologic Stress Electrocardiogram A pharmacologic stress electrocardiogram is a heart (cardiac) test that uses nuclear imaging to evaluate the blood supply to your heart. This test may also be called a pharmacologic stress electrocardiography. Pharmacologic means that a medicine is used to increase your heart rate and blood pressure.  This stress test is done to find areas of poor blood flow to the heart by determining the extent of coronary artery disease (CAD). Some people exercise on a treadmill, which naturally increases the blood flow to the heart. For those people unable to exercise on a treadmill, a medicine is used. This medicine stimulates your heart and will cause your heart to beat harder and more quickly, as if you were exercising.  Pharmacologic stress tests can help determine:  The adequacy of blood flow to your heart during increased levels of activity in order to clear you for discharge home.  The extent of coronary artery blockage caused by CAD.  Your prognosis if you have suffered a heart attack.  The effectiveness of cardiac procedures done, such as an angioplasty, which can increase the circulation in your coronary arteries.  Causes of chest pain or pressure. LET [facility] CARE PROVIDER KNOW ABOUT:  Any allergies you have.  All medicines you are taking, including vitamins, herbs, eye drops, creams, and over-the-counter medicines.  Previous problems you or members of your family have had with the use of anesthetics.  Any blood disorders you have.  Previous surgeries you have had.  Medical conditions  you have.  Possibility of pregnancy, if this applies.  If you are currently breastfeeding. RISKS AND COMPLICATIONS Generally, this is a safe procedure. However, as with any procedure, complications can occur. Possible complications include:  You develop pain or pressure in the following areas:  Chest.  Jaw or neck.  Between your shoulder blades.  Radiating down your left arm.  Headache.  Dizziness or light-headedness.  Shortness of breath.  Increased or irregular heartbeat.  Low blood pressure.  Nausea or vomiting.  Flushing.  Redness going up the arm and slight pain during injection of medicine.  Heart attack (rare). BEFORE THE PROCEDURE   Avoid all forms of caffeine for 24 hours before your test or as directed by your health care provider. This includes coffee, tea (even decaffeinated tea), caffeinated sodas, chocolate, cocoa, and certain pain medicines.  Follow your health care provider's instructions regarding eating and drinking before the test.  Take your medicines as directed at regular times with water unless instructed otherwise. Exceptions may include:  If you have diabetes, ask how you are to take your insulin or pills. It is common to adjust insulin dosing the morning of the test.  If you are taking beta-blocker medicines, it is important to talk to your health care provider about these medicines well before the date of your test. Taking beta-blocker medicines may interfere with the test. In some cases, these medicines need to be changed or stopped 24 hours or more before the test.  If you wear a nitroglycerin patch, it may need to be removed prior to the test. Ask your health care provider if the patch should be removed before the test.  If you  use an inhaler for any breathing condition, bring it with you to the test.  If you are an outpatient, bring a snack so you can eat right after the stress phase of the test.  Do not smoke for 4 hours prior to  the test or as directed by your health care provider.  Do not apply lotions, powders, creams, or oils on your chest prior to the test.  Wear comfortable shoes and clothing. Let your health care provider know if you were unable to complete or follow the preparations for your test. PROCEDURE   Multiple patches (electrodes) will be put on your chest. If needed, small areas of your chest may be shaved to get better contact with the electrodes. Once the electrodes are attached to your body, multiple wires will be attached to the electrodes, and your heart rate will be monitored.  An IV access will be started. A nuclear trace (isotope) is given. The isotope may be given intravenously, or it may be swallowed. Nuclear refers to several types of radioactive isotopes, and the nuclear isotope lights up the arteries so that the nuclear images are clear. The isotope is absorbed by your body. This results in low radiation exposure.  A resting nuclear image is taken to show how your heart functions at rest.  A medicine is given through the IV access.  A second scan is done about 1 hour after the medicine injection and determines how your heart functions under stress.  During this stress phase, you will be connected to an electrocardiogram machine. Your blood pressure and oxygen levels will be monitored. AFTER THE PROCEDURE   Your heart rate and blood pressure will be monitored after the test.  You may return to your normal schedule, including diet,activities, and medicines, unless your health care provider tells you otherwise.   This information is not intended to replace advice given to you by your health care provider. Make sure you discuss any questions you have with your health care provider.   Document Released: 03/04/2009 Document Revised: 10/21/2013 Document Reviewed: 06/23/2013 Elsevier Interactive Patient Education [employer].     If you need a refill on your cardiac medications  before your next appointment, please call your pharmacy.

## 2016-06-14 NOTE — Assessment & Plan Note (Signed)
History of hypertension blood pressure measures 118/74. He is on lisinopril. Continue current meds at current dosing

## 2016-06-14 NOTE — Assessment & Plan Note (Signed)
History of atypical chest pain with cardiac catheterization performed by Dr. Dellis Anes  6/10 because of an abnormal Myoview stress test that showed minimal CAD. He is scheduled for decompressive laminectomy by Dr. [facility] Lighter and was sent here for preoperative clearance. We will get a pharmacologic Myoview stress test to risk stratify him.

## 2016-06-15 ENCOUNTER — Telehealth (HOSPITAL_COMMUNITY): Payer: Self-pay

## 2016-06-15 NOTE — Telephone Encounter (Signed)
I attempted to reach pt through telephonic interpreter; I was unable to reach pt at this time. I will attempt to reach pt again tomorrow.

## 2016-06-16 ENCOUNTER — Telehealth: Payer: Self-pay | Admitting: Internal Medicine

## 2016-06-16 ENCOUNTER — Telehealth (HOSPITAL_COMMUNITY): Payer: Self-pay

## 2016-06-16 NOTE — Telephone Encounter (Signed)
Encounter complete. 

## 2016-06-16 NOTE — Telephone Encounter (Signed)
I dont know what he was trying to say.  It was something about using another medicine.  He would like to talk to the nurse

## 2016-06-19 ENCOUNTER — Telehealth: Payer: Self-pay | Admitting: Cardiovascular Disease

## 2016-06-19 NOTE — Telephone Encounter (Signed)
New message     Pt called wanting to know what medicine he can hold. I think that is what he said. He was hard to understand. He is having a procedure tomorrow. Please advise.

## 2016-06-19 NOTE — Telephone Encounter (Signed)
Called patient, line busy x 2. Will try again later. 

## 2016-06-19 NOTE — Telephone Encounter (Signed)
Discussed w patient, patient has nuclear stress test tomorrow - advised to take medications as he usually takes them - he is not on BBs. Pt voiced understanding and thanks.

## 2016-06-19 NOTE — Telephone Encounter (Signed)
Phone rings w/ no answer or VM pickup. 

## 2016-06-20 ENCOUNTER — Ambulatory Visit (HOSPITAL_COMMUNITY)
Admission: RE | Admit: 2016-06-20 | Discharge: 2016-06-20 | Disposition: A | Discharge status: Home / Self Care | Payer: Medicare Other | Source: Ambulatory Visit | Attending: Cardiovascular Disease | Admitting: Cardiovascular Disease

## 2016-06-20 DIAGNOSIS — R0602 Shortness of breath: Secondary | ICD-10-CM | POA: Diagnosis not present

## 2016-06-20 DIAGNOSIS — R079 Chest pain, unspecified: Secondary | ICD-10-CM | POA: Diagnosis not present

## 2016-06-20 DIAGNOSIS — Z01818 Encounter for other preprocedural examination: Secondary | ICD-10-CM | POA: Diagnosis not present

## 2016-06-20 DIAGNOSIS — R002 Palpitations: Secondary | ICD-10-CM | POA: Insufficient documentation

## 2016-06-20 DIAGNOSIS — Z01810 Encounter for preprocedural cardiovascular examination: Secondary | ICD-10-CM | POA: Insufficient documentation

## 2016-06-20 DIAGNOSIS — I10 Essential (primary) hypertension: Secondary | ICD-10-CM | POA: Diagnosis not present

## 2016-06-20 LAB — MYOCARDIAL PERFUSION IMAGING
LV dias vol: 92 mL (ref 62–150)
LV sys vol: 33 mL
Peak HR: 83 {beats}/min
Rest HR: 56 {beats}/min
SDS: 1
SRS: 1
SSS: 2
TID: 1.1

## 2016-06-20 MED ORDER — REGADENOSON 0.4 MG/5ML IV SOLN
0.4000 mg | Freq: Once | INTRAVENOUS | Status: AC
  Administered 2016-06-20: 0.4 mg via INTRAVENOUS

## 2016-06-20 MED ORDER — TECHNETIUM TC 99M TETROFOSMIN IV KIT
9.9000 | PACK | Freq: Once | INTRAVENOUS | Status: AC | PRN
  Administered 2016-06-20: 9.9 via INTRAVENOUS
  Filled 2016-06-20: qty 10

## 2016-06-20 MED ORDER — AMINOPHYLLINE 25 MG/ML IV SOLN
75.0000 mg | Freq: Once | INTRAVENOUS | Status: AC
  Administered 2016-06-20: 75 mg via INTRAVENOUS

## 2016-06-20 MED ORDER — TECHNETIUM TC 99M TETROFOSMIN IV KIT
29.6000 | PACK | Freq: Once | INTRAVENOUS | Status: AC | PRN
  Administered 2016-06-20: 29.6 via INTRAVENOUS
  Filled 2016-06-20: qty 30

## 2016-06-21 ENCOUNTER — Encounter: Payer: Self-pay | Admitting: Cardiovascular Disease

## 2016-06-22 NOTE — Brief Op Note (Signed)
   Marijean Bravo [phone]) interpreter for 06/20/16 MPI study.

## 2016-06-27 ENCOUNTER — Telehealth: Payer: Self-pay | Admitting: *Deleted

## 2016-06-27 DIAGNOSIS — R933 Abnormal findings on diagnostic imaging of other parts of digestive tract: Secondary | ICD-10-CM

## 2016-06-27 NOTE — Telephone Encounter (Signed)
Please let patient know that I have sent in referral to Dr. Collene Mares for patient to obtain colonoscopy. Hopeful he should not having any issues obtaining this procedure now.

## 2016-06-27 NOTE — Telephone Encounter (Signed)
Spoke with patient, he states he tried to set up his colonoscopy and was informed her needs to be seen by Dr. Collene Mares. Needs GI referral to Dr. Lorie Apley office, patient states he would like appointment to be set up any time after 9/11.

## 2016-07-04 ENCOUNTER — Telehealth: Payer: Self-pay | Admitting: Internal Medicine

## 2016-07-04 NOTE — Telephone Encounter (Signed)
Pt is calling because he needs an appointment with either Dr. Henrene Pastor or Dr. Collene Mares. When he called Dr. Henrene Pastor office they told him he has to call Dr. Collene Mares. When he called Dr. Collene Mares they told him to call Dr. Henrene Pastor. He is very confused and needs to know who he is suppose to call. Please call when we know which doctor should be seeing him. Blima Rich

## 2016-07-05 ENCOUNTER — Telehealth: Payer: Self-pay | Admitting: Internal Medicine

## 2016-07-05 NOTE — Telephone Encounter (Signed)
Dr. Blanch Media last office note faxed to Dr. Youlanda Mighty' office. Dr Henrene Pastor has stated that he will no longer see patient and he needs to follow up with Dr. Collene Mares. Sent fax informing of this, they should be contacting patient to schedule appointment.

## 2016-07-05 NOTE — Telephone Encounter (Signed)
Left message for pt to call back.  Discussed with pt that Dr. Collene Mares had done his last colon and that he needed to follow-up with her.

## 2016-07-07 ENCOUNTER — Telehealth: Payer: Self-pay | Admitting: Internal Medicine

## 2016-07-07 NOTE — Telephone Encounter (Signed)
Pt called again asking who would do his colonoscopy. Per notes,it seems Dr Collene Mares says Dr Henrene Pastor is to see pt and Dr Henrene Pastor says Dr Collene Mares is to see him. Called Dr Collene Mares at [phone] to find out.  Person answering the phone stated pt had transferred out of the practice several yrs ago.  She also stated Dr Collene Mares had talked to Dr Henrene Pastor yesterday. She did not know what had been decided.  She suggested just waiting a few days.

## 2016-07-14 ENCOUNTER — Other Ambulatory Visit: Payer: Self-pay | Admitting: Internal Medicine

## 2016-07-14 MED ORDER — ALPRAZOLAM 0.5 MG PO TABS: 0.2500 mg | ORAL_TABLET | Freq: Every day | ORAL | 1 refills | Status: DC | PRN

## 2016-07-14 NOTE — Telephone Encounter (Signed)
Needs refills on alprazolam.  [facility] on [address]

## 2016-07-17 MED ORDER — ALPRAZOLAM 0.5 MG PO TABS: 0.2500 mg | ORAL_TABLET | Freq: Every day | ORAL | 1 refills | Status: DC | PRN

## 2016-07-17 NOTE — Addendum Note (Signed)
Addended by: Kerrin Mo Z on: 07/17/2016 06:20 PM   Modules accepted: Orders

## 2016-07-17 NOTE — Addendum Note (Signed)
Addended by: Kerrin Mo Z on: 07/17/2016 02:37 PM   Modules accepted: Orders

## 2016-07-28 ENCOUNTER — Telehealth: Payer: Self-pay | Admitting: *Deleted

## 2016-07-28 NOTE — Telephone Encounter (Signed)
Requesting surgical clearance:   1. Type of surgery: Spine: Central decompressive laminectomy- (spinal stenosis)   2. Surgeon: Dr Latanya Maudlin  3. Surgical date: pending clearance  4. Medications that need to be held: n/a  5. CAD: Yes     6. I will defer to: Dr [facility] Attn: Fabio Asa  Fax- [phone] Phone- [phone]

## 2016-07-29 NOTE — Telephone Encounter (Signed)
Low risk myoview. Cleared for laminectomy at low risk   JJB

## 2016-07-31 ENCOUNTER — Ambulatory Visit (INDEPENDENT_AMBULATORY_CARE_PROVIDER_SITE_OTHER): Payer: Medicare Other | Admitting: Internal Medicine

## 2016-07-31 DIAGNOSIS — K581 Irritable bowel syndrome with constipation: Secondary | ICD-10-CM | POA: Diagnosis not present

## 2016-07-31 DIAGNOSIS — I10 Essential (primary) hypertension: Secondary | ICD-10-CM | POA: Diagnosis not present

## 2016-07-31 DIAGNOSIS — R140 Abdominal distension (gaseous): Secondary | ICD-10-CM

## 2016-07-31 LAB — COMPLETE METABOLIC PANEL WITH GFR
ALT: 14 U/L (ref 9–46)
AST: 19 U/L (ref 10–35)
Albumin: 3.9 g/dL (ref 3.6–5.1)
Alkaline Phosphatase: 41 U/L (ref 40–115)
BUN: 18 mg/dL (ref 7–25)
CO2: 24 mmol/L (ref 20–31)
Calcium: 9.1 mg/dL (ref 8.6–10.3)
Chloride: 104 mmol/L (ref 98–110)
Creat: 1.55 mg/dL — ABNORMAL HIGH (ref 0.70–1.18)
GFR, Est African American: 49 mL/min — ABNORMAL LOW (ref >=60)
GFR, Est Non African American: 43 mL/min — ABNORMAL LOW (ref >=60)
Glucose, Bld: 99 mg/dL (ref 65–99)
Potassium: 4.2 mmol/L (ref 3.5–5.3)
Sodium: 142 mmol/L (ref 135–146)
Total Bilirubin: 0.2 mg/dL (ref 0.2–1.2)
Total Protein: 7 g/dL (ref 6.1–8.1)

## 2016-07-31 NOTE — Progress Notes (Signed)
   [facility] Kerrin Mo, MD Phone: [phone]  Reason For Visit: IBS and HTN follow up   CHRONIC HTN: Reports No issues with blood pressure medication  Current Meds - Lisinpril 20 mg  Reports good compliance,took meds today. Tolerating well, w/o complaints. Lifestyle - exercise daily  Denies CP, dyspnea, HA, edema, dizziness / lightheadedness  IBS  -  Patient does not have an appointment as of yet for his colonoscopy. States he has been referred back and forth by [facility] and [facility]; and has not received an appointment from either GI group.  - Patients complains of continued distention and early satiety at times, he feels this is worse. No nausea, vomiting. Flatus daily. Daily bowel movements.  - Current Meds: colace, amitiza, miralax - he states he is having good, soft daily bowel movements. Though he is concerned that the Miralax is giving him a cough and he is not sure he wants to take this anymore.  - abdominal surgeries include gallbladder removal and hernia repair   Past Medical History Reviewed problem list.  Medications- reviewed and updated No additions to family history Social history- patient is a non- smoker  Objective: BP 132/74 (BP Location: Left Arm, Patient Position: Sitting, Cuff Size: Normal)   Pulse 64   Temp 97.7 F (36.5 C) (Oral)   Ht 5\' 7"  (1.702 m)   Wt 187 lb 9.6 oz (85.1 kg)   SpO2 100%   BMI 29.38 kg/m  Gen: NAD, alert, cooperative with exam Cardio: regular rate and rhythm, S1S2 heard, no murmurs appreciated Pulm: clear to auscultation bilaterally, no wheezes, rhonchi or rales GI: soft, non-tender, slight distended abdomen, bowel sounds present, no hepatomegaly, no splenomegaly, no fluid wave   Assessment/Plan: See problem based a/p  IBS (irritable bowel syndrome) Patient indicates increased distention and saiety over the past two months, thought his weight has increased by 5 lbs since his last visit. Though patient  has had several abdominal surgeries, he lacks any of the symptoms or signs of small bowel obstruction. No nausea/vomiting. Positive for flatus, bowel movements, and normal bowel sounds. Patient does seem slightly distended on exam today, though it is hard to determine if this his normal anatomy; will consider ruling out out reasons for distention besides obstruction including liver disease, however unlikely given exam.  -  CMET and RUQ Ultrasound  -  Patient is due for colonoscopy, will need to touch base with GI to determine why as a previously established patient he has not been able to get an appointment - discussed with Tia that this has been difficult to resolve  - Patient concerned about cough associated with Miralax - provided the option that he could stop the Miralax and try to eat a higher fiber diet or continue it as he desired - Continue colace and Amtiza   Essential hypertension Well controlled, Continue lisinopril

## 2016-07-31 NOTE — Patient Instructions (Signed)
I want you to get imaging of your right upper quadrant. I will discuss with referral specialist about your case to determine where to get your colonoscopy. Please continue with your medications

## 2016-08-01 ENCOUNTER — Telehealth: Payer: Self-pay | Admitting: *Deleted

## 2016-08-01 NOTE — Telephone Encounter (Signed)
Patient called stating that his doctor was going to send in medication for leg swelling.  He had an office visit 07/31/16.  Please advise.  Derl Barrow, RN

## 2016-08-01 NOTE — Telephone Encounter (Signed)
Clearance routed to number provided.  

## 2016-08-01 NOTE — Telephone Encounter (Signed)
This issue was not discussed with patient, it is possible there was some miscommunication due to language barriers. However, if patient is concerned about leg swelling or worsening leg swelling I would be happy to see him next week to discuss this issue.

## 2016-08-02 NOTE — Assessment & Plan Note (Signed)
Patient indicates increased distention and saiety over the past two months, thought his weight has increased by 5 lbs since his last visit. Though patient has had several abdominal surgeries, he lacks any of the symptoms or signs of small bowel obstruction. No nausea/vomiting. Positive for flatus, bowel movements, and normal bowel sounds. Patient does seem slightly distended on exam today, though it is hard to determine if this his normal anatomy; will consider ruling out out reasons for distention besides obstruction including liver disease, however unlikely given exam.  -  CMET and RUQ Ultrasound  -  Patient is due for colonoscopy, will need to touch base with GI to determine why as a previously established patient he has not been able to get an appointment - discussed with Tia that this has been difficult to resolve  - Patient concerned about cough associated with Miralax - provided the option that he could stop the Miralax and try to eat a higher fiber diet or continue it as he desired - Continue colace and Amtiza

## 2016-08-02 NOTE — Assessment & Plan Note (Signed)
Well controlled, Continue lisinopril

## 2016-08-02 NOTE — Addendum Note (Signed)
Addended by: Kerrin Mo Z on: 08/02/2016 06:11 PM   Modules accepted: Orders

## 2016-08-04 ENCOUNTER — Ambulatory Visit (INDEPENDENT_AMBULATORY_CARE_PROVIDER_SITE_OTHER): Payer: Medicare Other | Admitting: *Deleted

## 2016-08-04 ENCOUNTER — Ambulatory Visit (HOSPITAL_COMMUNITY): Admission: RE | Admit: 2016-08-04 | Payer: Medicare Other | Source: Ambulatory Visit

## 2016-08-04 DIAGNOSIS — Z23 Encounter for immunization: Secondary | ICD-10-CM

## 2016-08-07 ENCOUNTER — Telehealth: Payer: Self-pay | Admitting: Internal Medicine

## 2016-08-07 NOTE — Telephone Encounter (Signed)
[place] to discuss [name] colonoscopy. He stated at last appointment having difficult getting an appointment. Per nursing staff, they will relay this information to Dr. Collene Mares and have her page me back to discuss patient.

## 2016-08-10 ENCOUNTER — Ambulatory Visit (HOSPITAL_COMMUNITY)
Admission: RE | Admit: 2016-08-10 | Discharge: 2016-08-10 | Disposition: A | Discharge status: Home / Self Care | Payer: Medicare Other | Source: Ambulatory Visit | Attending: Family Medicine | Admitting: Family Medicine

## 2016-08-10 ENCOUNTER — Ambulatory Visit: Payer: Medicare Other | Admitting: Internal Medicine

## 2016-08-10 DIAGNOSIS — R140 Abdominal distension (gaseous): Secondary | ICD-10-CM

## 2016-08-10 DIAGNOSIS — Z9049 Acquired absence of other specified parts of digestive tract: Secondary | ICD-10-CM | POA: Insufficient documentation

## 2016-08-14 ENCOUNTER — Telehealth: Payer: Self-pay | Admitting: Internal Medicine

## 2016-08-14 NOTE — Telephone Encounter (Signed)
Please schedule pt to see an APP for possible colonoscopy. Pt having abdominal distension.

## 2016-08-14 NOTE — Telephone Encounter (Signed)
Did not receive a call back from [facility]. Called again and was told that patient transferred out to [place]. Called [facility] and they stated that they would make an appointment for patient. Will inform patient of this information.

## 2016-08-14 NOTE — Telephone Encounter (Signed)
See extender for evaluation and set up exam if appropriate. Thanks

## 2016-08-14 NOTE — Telephone Encounter (Signed)
Dr. Henrene Pastor this pt is needing to have a colonoscopy. He has been seen by Dr. Collene Mares and by you. I called Dr. Lorie Apley office and was told that he left their practice and told them he would not be back, stated that he was seeing Dr. Henrene Pastor. Do you want him scheduled for an OV? Per note from family [facility] he has been having issues with abdominal distension and early saiety. Please advise.

## 2016-08-15 NOTE — Telephone Encounter (Signed)
Patient has been scheduled for OV with Anderson Malta 08/29/16

## 2016-08-22 DIAGNOSIS — H25813 Combined forms of age-related cataract, bilateral: Secondary | ICD-10-CM | POA: Diagnosis not present

## 2016-08-29 ENCOUNTER — Ambulatory Visit (INDEPENDENT_AMBULATORY_CARE_PROVIDER_SITE_OTHER): Payer: Medicare Other | Admitting: Physician Assistant

## 2016-08-29 ENCOUNTER — Encounter: Payer: Self-pay | Admitting: Physician Assistant

## 2016-08-29 VITALS — BP 100/60 | HR 60 | Ht 67.0 in | Wt 180.0 lb

## 2016-08-29 DIAGNOSIS — R194 Change in bowel habit: Secondary | ICD-10-CM | POA: Diagnosis not present

## 2016-08-29 DIAGNOSIS — Z86010 Personal history of colonic polyps: Secondary | ICD-10-CM

## 2016-08-29 DIAGNOSIS — K219 Gastro-esophageal reflux disease without esophagitis: Secondary | ICD-10-CM | POA: Diagnosis not present

## 2016-08-29 DIAGNOSIS — R140 Abdominal distension (gaseous): Secondary | ICD-10-CM | POA: Diagnosis not present

## 2016-08-29 DIAGNOSIS — K625 Hemorrhage of anus and rectum: Secondary | ICD-10-CM

## 2016-08-29 MED ORDER — OMEPRAZOLE 20 MG PO CPDR: 20.0000 mg | DELAYED_RELEASE_CAPSULE | Freq: Every day | ORAL | 2 refills | Status: DC

## 2016-08-29 MED ORDER — NA SULFATE-K SULFATE-MG SULF 17.5-3.13-1.6 GM/177ML PO SOLN: 1.0000 | ORAL | 0 refills | Status: DC

## 2016-08-29 MED ORDER — ESOMEPRAZOLE MAGNESIUM 40 MG PO CPDR: 40.0000 mg | DELAYED_RELEASE_CAPSULE | Freq: Every day | ORAL | 3 refills | Status: DC

## 2016-08-29 NOTE — Addendum Note (Signed)
Addended by: Wyline Beady on: 08/29/2016 11:59 AM   Modules accepted: Orders

## 2016-08-29 NOTE — Progress Notes (Signed)
Chief Complaint: Generalized abdominal pain, Abdominal distension, GERD  HPI:  Omar Sanders is a 75 year old [place] male, with past medical history of anxiety, colon polyps, GERD, hemorrhoids, hypertension, IBS and obstructive sleep apnea, who was referred to me by Tonette Bihari, MD for a complaint of nausea abdominal pain and distention. He has previously followed with Dr. Henrene Pastor. Last seen 10/28/15 for his history of IBS.  Today we do use a video interpreter as the patient's native language is [place]. He is somewhat of a poor historian and describes that over the past year he has had an increase in abdominal distention which he believes is related to gas because he can "hear it in there". The patient tells me that currently he is using Amitiza 8 mcg what sounds like "every once in a while". The patient tells that when he is on this medication it does help him to have regular bowel movements and decreases his bloating, but also seems to "make my rectum itch". The patient describes that when off this medication he does strain to have a bowel movement and sometimes will see some bright red blood. Along with all these symptoms the patient describes some acid reflux during times of constipation which sometimes makes him to cough. He does not have any more Ranitidine 150 mg which he was taking once daily, but tells me this did not seem to help at that time.  Per chart review patient was followed at [facility] for a time. With a colonoscopy on 02/27/2011 by Dr. Collene Mares which revealed small internal hemorrhoids and a sessile polyp. Pathology is unavailable. Recommendations were for repeat in 5 years.  Patient denies fever, chills, melena, change in diet, weight loss, fatigue, anorexia, nausea, vomiting or symptoms that awaken him at night.  Past Medical History:  Diagnosis Date  . Allergy   . Anal fissure   . Anemia   . Anxiety   . Benign prostatic hyperplasia   . Chronic pain   . Colon polyp    . GERD (gastroesophageal reflux disease)   . Headache(784.0)   . Hemorrhoids   . Hyperlipidemia    Hx: of  . Hypertension   . IBS (irritable bowel syndrome)   . Incontinent of urine    Hx:of  . Migraines   . Neuromuscular disorder ([place])    periferal neuropathy per patient  . OSA (obstructive sleep apnea) 11/2009   sleep study with mild OSA, pt unwilling to use CPAP  . Seizures ([place])     Past Surgical History:  Procedure Laterality Date  . 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 CHOLECYSTECTOMY  2008  . COLONOSCOPY  2012   Dr Collene Mares  . HEMORRHOID SURGERY     anal surgery  . INGUINAL HERNIA REPAIR Bilateral 03/11/2013   Procedure: LAPAROSCOPIC BILATERAL INGUINAL HERNIA REPAIR;  Surgeon: Ralene Ok, MD;  Location: [facility];  Service: General;  Laterality: Bilateral;  . INSERTION OF MESH Bilateral 03/11/2013   Procedure: INSERTION OF MESH;  Surgeon: Ralene Ok, MD;  Location: [facility];  Service: General;  Laterality: Bilateral;  . NM MYOCAR PERF WALL MOTION  05/27/2009   mild iscemia mid inferio & apical regions.  . NM MYOCAR PERF WALL MOTION  11/01/2012   negative for ischemia  . US ECHOCARDIOGRAPHY  05/27/2009   mild mitral annular ca+,AOV mildly sclerotic    Current Outpatient Prescriptions  Medication Sig Dispense Refill  . ALPRAZolam (XANAX) 0.5  MG tablet Take 0.5 tablets (0.25 mg total) by mouth daily as needed for anxiety. 15 tablet 1  . docusate sodium (COLACE) 50 MG capsule Take 1 capsule (50 mg total) by mouth 2 (two) times daily. 10 capsule 0  . finasteride (PROSCAR) 5 MG tablet Take 1 tablet (5 mg total) by mouth daily. 30 tablet 5  . Fish Oil-Cholecalciferol (OMEGA-3 FISH OIL/VITAMIN D3) 1000-1000 MG-UNIT CAPS Take 1 capsule by mouth daily. 90 capsule 3  . hydrocortisone (ANUSOL-HC) 2.5 % rectal cream Place 1 application rectally 2 (two) times daily. 30 g 1  . lisinopril (PRINIVIL,ZESTRIL) 20 MG tablet Take 1  tablet (20 mg total) by mouth daily. 90 tablet 3  . lubiprostone (AMITIZA) 8 MCG capsule Take 1 capsule (8 mcg total) by mouth 2 (two) times daily with a meal. 30 capsule 1  . polyethylene glycol powder (GLYCOLAX/MIRALAX) powder Take 17 g by mouth 2 (two) times daily as needed. 3350 g 1  . ranitidine (ZANTAC) 150 MG capsule Take 1 capsule (150 mg total) by mouth 2 (two) times daily. 30 capsule 3   No current facility-administered medications for this visit.     Allergies as of 08/29/2016 - Review Complete 08/29/2016  Allergen Reaction Noted  . Aspirin    . Lubiprostone  05/12/2016  . Pantoprazole  05/16/2016  . Vitamin c  10/30/2012    History reviewed. No pertinent family history.  Social History   Social History  . Marital status: Widowed    Spouse name: N/A  . Number of children: 5  . Years of education: N/A   Occupational History  . Not on file.   Social History Main Topics  . Smoking status: Never Smoker  . Smokeless tobacco: Never Used  . Alcohol use No  . Drug use: No  . Sexual activity: Not Currently   Other Topics Concern  . Not on file   Social History Narrative  . No narrative on file    Review of Systems:     Constitutional: No weight loss, fever or chills HEENT: Eyes: No change in vision               Ears, Nose, Throat:  No change in hearing  Skin: No rash or itching Cardiovascular: No chest pain or palpitations Respiratory: No SOB or cough Gastrointestinal: See HPI and otherwise negative Genitourinary: No dysuria or change in urinary frequency Neurological: No headache or dizziness Musculoskeletal: No new muscle or joint pain Hematologic: No bruising Psychiatric: No history of depression or anxiety    Physical Exam:  Vital signs: Ht 5\' 7"  (1.702 m)   Wt 180 lb (81.6 kg)   BMI 28.19 kg/m   General:  [place] male appears to be in NAD, Well developed, Well nourished, alert and cooperative Head:  Normocephalic and atraumatic. Eyes:    PEERL, EOMI. No icterus. Conjunctiva pink. Ears:  Normal auditory acuity. Neck:  Supple Throat: Oral cavity and pharynx without inflammation, swelling or lesion.  Lungs: Respirations even and unlabored. Lungs clear to auscultation bilaterally.   No wheezes, crackles, or rhonchi.  Heart: Normal S1, S2. No MRG. Regular rate and rhythm. No peripheral edema, cyanosis or pallor.  Abdomen:  Soft, mild distention, mild generalized TTP. No rebound or guarding. Normal bowel sounds. No appreciable masses or hepatomegaly. Rectal:  Not performed.  Msk:  Symmetrical without gross deformities Extremities:  Without edema, no deformity or joint abnormality. Normal ROM Neurologic:  Alert and  oriented x4;  grossly normal neurologically.  Skin:   Dry and intact without significant lesions or rashes. Psychiatric: Oriented to person, place and time. Demonstrates good judgement and reason without abnormal affect or behaviors.  No recent labs or imaging  Assessment: 1. Change in bowel habits: Patient describes over the past year has become more constipated, this is controlled with Amitiza 8 mcg daily, but the patient recently stopped taking this on a daily basis due to "rectal itching"; discussed patient that likely his itching is from times of constipation and strain and hemorrhoids and that he should use Preparation H for this and continue Amitiza on a daily basis 2. GERD: Increased over the past year, patient has stopped taking Ranitidine 150 mg daily. He tells my nurse that he cannot use omeprazole 20 mg or Pantoprazole as this never helped either; consider gastritis versus H. pylori versus other 3. Rectal bleed: Patient describes occasional bright red blood per rectum during constipation; likely related to hemorrhoids, patient does have history of this 4. History of polyps: Seen at time of last colonoscopy in 2012, we do not have pathology, repeat was recommended in 5 years 5. Bloating: Likely related to  constipation as this appears to be better when patient has a bowel movement  Plan: 1. At this time recommend an EGD and colonoscopy for further evaluation of the patient's symptoms. He is also due for screening colonoscopy, so with a change in bowel habits recently and rectal bleeding this is good timing. Discussed risks, benefits, limitations alternatives and the patient agrees to proceed. 2. Patient will come back in before time of his colonoscopy as this was scheduled in January with Dr. Henrene Pastor, so that we can have an interpreter present to go over the instructions for prep. This was felt necessary when the patient did not fully understand instructions today. 3. Prescribe Nexium 20 mg daily, 30-60 minutes before eating. The patient tells me that neither pantoprazole or omeprazole has worked in the past. 4. Recommend the patient continue daily Amitiza 8 mcg daily. He should use Preparation H for rectal itching. 5. Patient to follow in clinic with Dr. Henrene Pastor in the future.  Over 60 minutes was spent during this office visit relaying information.  Omar Newer, PA-C [facility] 08/29/2016, 9:08 AM  Cc: Tonette Bihari, MD

## 2016-08-29 NOTE — Progress Notes (Signed)
Patient with chronic abdominal complaints, likely functional, presents with the same. Evaluation reviewed. Due for surveillance colonoscopy. Agree with plans as outlined

## 2016-08-29 NOTE — Patient Instructions (Addendum)
Please purchase the following medications over the counter and take as directed: Preparation H  Omeprazole 20 mg daily   Stop Ranitidine 150 mh   Continue Amitiza 8 mcg daily.   Follow up with Dr. Henrene Pastor.   You have been scheduled for an endoscopy and colonoscopy. Please follow the written instructions given to you at your visit today. Please pick up your prep supplies at the pharmacy within the next 1-3 days. If you use inhalers (even only as needed), please bring them with you on the day of your procedure. Your physician has requested that you go to www.startemmi.com and enter the access code given to you at your visit today. This web site gives a general overview about your procedure. However, you should still follow specific instructions given to you by our office regarding your preparation for the procedure.

## 2016-08-31 ENCOUNTER — Other Ambulatory Visit: Payer: Self-pay | Admitting: Internal Medicine

## 2016-08-31 MED ORDER — HYDROCORTISONE 2.5 % RE CREA: 1.0000 "application " | TOPICAL_CREAM | Freq: Two times a day (BID) | RECTAL | 1 refills | Status: DC

## 2016-08-31 NOTE — Telephone Encounter (Signed)
Pt called and needs a refill on his hydrocortisone sent in to his pharmacy [facility] on [address]. jw

## 2016-09-01 ENCOUNTER — Telehealth: Payer: Self-pay | Admitting: Internal Medicine

## 2016-09-01 NOTE — Telephone Encounter (Signed)
Pt is calling because the pharmacy stated that we didn't fill his hydrocortisone. This was e-scribed yesterday 08/31/16 can we call the pharmacy and straighten this out. jw

## 2016-09-01 NOTE — Telephone Encounter (Signed)
Spoke with Ronalee Belts, Pharmacist at [facility], who states hydrocortisone is ready for pick up. States patient wants hydrocodone which is prescribed by a different doctor. Patient needs to bring script in for this med to be filled. Hydrocodone is not on patient's current med list.  Hubbard Hartshorn, RN, BSN

## 2016-09-12 ENCOUNTER — Other Ambulatory Visit: Payer: Self-pay | Admitting: Internal Medicine

## 2016-09-12 NOTE — Telephone Encounter (Signed)
Needs refill on alprazolam. [facility] [address]

## 2016-09-13 NOTE — Telephone Encounter (Signed)
2nd request. Pt states he has been out of this medication for 3 days. Please advise. Thanks! ep

## 2016-09-14 DIAGNOSIS — H25813 Combined forms of age-related cataract, bilateral: Secondary | ICD-10-CM | POA: Diagnosis not present

## 2016-09-14 MED ORDER — ALPRAZOLAM 0.5 MG PO TABS: 0.2500 mg | ORAL_TABLET | Freq: Every day | ORAL | 1 refills | Status: DC | PRN

## 2016-09-14 NOTE — Telephone Encounter (Signed)
Pt called again about his alprazalam refilll. He is out of his medication. [facility] [address]

## 2016-09-14 NOTE — Telephone Encounter (Signed)
Medication was called in  

## 2016-09-18 DIAGNOSIS — M5442 Lumbago with sciatica, left side: Secondary | ICD-10-CM | POA: Diagnosis not present

## 2016-09-18 DIAGNOSIS — G8929 Other chronic pain: Secondary | ICD-10-CM | POA: Diagnosis not present

## 2016-09-25 DIAGNOSIS — H2511 Age-related nuclear cataract, right eye: Secondary | ICD-10-CM | POA: Diagnosis not present

## 2016-09-25 DIAGNOSIS — H25811 Combined forms of age-related cataract, right eye: Secondary | ICD-10-CM | POA: Diagnosis not present

## 2016-10-04 DIAGNOSIS — I10 Essential (primary) hypertension: Secondary | ICD-10-CM | POA: Diagnosis not present

## 2016-10-10 ENCOUNTER — Other Ambulatory Visit: Payer: Self-pay | Admitting: Internal Medicine

## 2016-10-10 NOTE — Telephone Encounter (Signed)
Pt needs a refill on Alprazolam. Pt would like it called into his pharmacy. Please advise. Thanks! ep

## 2016-10-26 DIAGNOSIS — H25812 Combined forms of age-related cataract, left eye: Secondary | ICD-10-CM | POA: Diagnosis not present

## 2016-10-26 DIAGNOSIS — H2512 Age-related nuclear cataract, left eye: Secondary | ICD-10-CM | POA: Diagnosis not present

## 2016-11-06 ENCOUNTER — Ambulatory Visit: Payer: Medicare Other | Admitting: Family Medicine

## 2016-11-06 ENCOUNTER — Encounter: Payer: Medicare Other | Admitting: Internal Medicine

## 2016-11-06 ENCOUNTER — Ambulatory Visit (INDEPENDENT_AMBULATORY_CARE_PROVIDER_SITE_OTHER): Payer: Medicare Other | Admitting: Family Medicine

## 2016-11-06 ENCOUNTER — Encounter: Payer: Self-pay | Admitting: Family Medicine

## 2016-11-06 VITALS — BP 128/66 | HR 61 | Temp 97.5°F | Ht 67.0 in | Wt 183.4 lb

## 2016-11-06 DIAGNOSIS — I10 Essential (primary) hypertension: Secondary | ICD-10-CM

## 2016-11-06 DIAGNOSIS — R3912 Poor urinary stream: Secondary | ICD-10-CM

## 2016-11-06 DIAGNOSIS — F411 Generalized anxiety disorder: Secondary | ICD-10-CM

## 2016-11-06 DIAGNOSIS — K581 Irritable bowel syndrome with constipation: Secondary | ICD-10-CM | POA: Diagnosis not present

## 2016-11-06 DIAGNOSIS — N401 Enlarged prostate with lower urinary tract symptoms: Secondary | ICD-10-CM | POA: Diagnosis not present

## 2016-11-06 DIAGNOSIS — K219 Gastro-esophageal reflux disease without esophagitis: Secondary | ICD-10-CM

## 2016-11-06 MED ORDER — LISINOPRIL 20 MG PO TABS: 20.0000 mg | ORAL_TABLET | Freq: Every day | ORAL | 3 refills | Status: DC

## 2016-11-06 MED ORDER — ALPRAZOLAM 0.5 MG PO TABS: 0.2500 mg | ORAL_TABLET | Freq: Every day | ORAL | 1 refills | Status: AC | PRN

## 2016-11-06 MED ORDER — ALPRAZOLAM 0.5 MG PO TABS: 0.2500 mg | ORAL_TABLET | Freq: Every day | ORAL | 1 refills | Status: DC | PRN

## 2016-11-06 NOTE — Assessment & Plan Note (Signed)
Well-controlled Continue alprazolam 0.25 mg daily when necessary Prescription given today for #15 tablets and 1 refill Follow-up with PCP in 2 months and consider discontinuing versus further down titration

## 2016-11-06 NOTE — Assessment & Plan Note (Signed)
Intermittent reflux PPI discontinued Continue ranitidine twice a day Can use Tums when necessary

## 2016-11-06 NOTE — Assessment & Plan Note (Signed)
Well-controlled.  Continue lisinopril. 

## 2016-11-06 NOTE — Progress Notes (Signed)
Subjective:   Omar Sanders is a 76 y.o. male with a history of HTN, IBS, GERD, anxiety, CKD, BPH here for medication refill.  History taken with assistance of Omar Sanders interpreter Omar Sanders [phone]  HTN: - Medications: lisinopril 20mg  daily - Compliance: good - took today - Checking BP at home: no - Denies any SOB, CP, vision changes, LE edema, medication SEs, or symptoms of hypotension - Diet: rice and vegetables - Exercise: daily  LUTS - urinates frequently - weak stream, difficult to start peeing - no fevers, suprapubic pain - taking finasteride 5mg  daily - which helped tremendously - intermittent - worse with constipation - taking Colace - also tried Amitiza which also caused cough with reflux - discontinued - reports reflux with Miralax - Colace controls constipation well which helps urinary problems - tried Nexium and Zantac for reflux in the past but they did not help - describes reflux like gas symptoms - never tried Tums  Anxiety - taking Xanax 0.25mg  daily prn for anxiety - last Rx 09/14/16 #15 with 1 refill - Xanax helps with anxiety - feels sad when worries about things - no SI/HI - taking daily  Review of Systems:  Per HPI.   Social History: never smoker  Objective:  BP 128/66 (BP Location: Right Arm, Patient Position: Sitting, Cuff Size: Normal)   Pulse 61   Temp 97.5 F (36.4 C) (Oral)   Ht 5\' 7"  (1.702 m)   Wt 183 lb 6.4 oz (83.2 kg)   SpO2 99%   BMI 28.72 kg/m   Gen:  76 y.o. male in NAD  HEENT: NCAT, MMM, EOMI, PERRL, anicteric sclerae CV: RRR, no MRG Resp: Non-labored, CTAB, no wheezes noted Abd: Soft, NTND, BS present, no guarding or organomegaly Ext: WWP, no edema MSK: Gait intact, no obvious deformities Neuro: Alert and oriented, speech normal       Chemistry      Component Value Date/Time   NA 142 07/31/2016 1502   NA 138 02/10/2013   K 4.2 07/31/2016 1502   CL 104 07/31/2016 1502   CO2 24 07/31/2016 1502   BUN 18 07/31/2016  1502   BUN 27 (A) 02/10/2013   CREATININE 1.55 (H) 07/31/2016 1502   GLU 78 02/10/2013      Component Value Date/Time   CALCIUM 9.1 07/31/2016 1502   ALKPHOS 41 07/31/2016 1502   AST 19 07/31/2016 1502   ALT 14 07/31/2016 1502   BILITOT 0.2 07/31/2016 1502      Lab Results  Component Value Date   WBC 5.7 02/14/2016   HGB 12.1 (L) 02/14/2016   HCT 37.5 (L) 02/14/2016   MCV 81.9 02/14/2016   PLT 218 02/14/2016   Lab Results  Component Value Date   TSH 1.613 03/08/2012   Lab Results  Component Value Date   HGBA1C 6.3 07/12/2015   Assessment & Plan:     Omar Sanders is a 76 y.o. male here for   Essential hypertension Well-controlled Continue lisinopril  IBS (irritable bowel syndrome) Patient reports he continues to have intermittent symptoms Discussed the chronicity of this disorder with the patient and need to treat the symptoms including constipation and gas Continue Colace Patient discontinued Amitiza due to side effects  GERD Intermittent reflux PPI discontinued Continue ranitidine twice a day Can use Tums when necessary  BPH (benign prostatic hyperplasia) Symptoms fairly well controlled at baseline on finasteride Advised on controlling constipation as above to help with intermittent urinary symptoms related to his constipation  No symptoms of UTI today  Anxiety state Well-controlled Continue alprazolam 0.25 mg daily when necessary Prescription given today for #15 tablets and 1 refill Follow-up with PCP in 2 months and consider discontinuing versus further down titration   Virginia Crews, MD MPH PGY-3,  [facility] 11/06/2016  12:15 PM

## 2016-11-06 NOTE — Assessment & Plan Note (Signed)
Symptoms fairly well controlled at baseline on finasteride Advised on controlling constipation as above to help with intermittent urinary symptoms related to his constipation No symptoms of UTI today

## 2016-11-06 NOTE — Assessment & Plan Note (Signed)
Patient reports he continues to have intermittent symptoms Discussed the chronicity of this disorder with the patient and need to treat the symptoms including constipation and gas Continue Colace Patient discontinued Amitiza due to side effects

## 2016-12-05 ENCOUNTER — Encounter: Payer: Medicare Other | Admitting: Internal Medicine

## 2017-01-15 ENCOUNTER — Ambulatory Visit: Payer: Medicare Other | Admitting: Internal Medicine

## 2017-01-15 ENCOUNTER — Ambulatory Visit (INDEPENDENT_AMBULATORY_CARE_PROVIDER_SITE_OTHER): Payer: Medicare Other | Admitting: Internal Medicine

## 2017-01-15 VITALS — BP 120/60 | HR 80 | Temp 97.7°F | Ht 67.0 in | Wt 185.6 lb

## 2017-01-15 DIAGNOSIS — I10 Essential (primary) hypertension: Secondary | ICD-10-CM

## 2017-01-15 DIAGNOSIS — F411 Generalized anxiety disorder: Secondary | ICD-10-CM

## 2017-01-15 MED ORDER — ALPRAZOLAM 0.5 MG PO TABS: 0.5000 mg | ORAL_TABLET | Freq: Every evening | ORAL | 0 refills | Status: DC | PRN

## 2017-01-15 MED ORDER — LISINOPRIL 20 MG PO TABS: 20.0000 mg | ORAL_TABLET | Freq: Every day | ORAL | 3 refills | Status: DC

## 2017-01-15 MED ORDER — ESCITALOPRAM OXALATE 10 MG PO TABS: 10.0000 mg | ORAL_TABLET | Freq: Every day | ORAL | 1 refills | Status: DC

## 2017-01-15 NOTE — Patient Instructions (Signed)
Please start the Lexapro to help with your anxiety. I will refill xanax, however I want you to try to stop this medication with time. Please follow up with me three weeks to see how things are doing

## 2017-01-15 NOTE — Progress Notes (Signed)
   [facility] Kerrin Mo, MD Phone: [phone]  Reason For Visit: F/U HTN and Anxiety   CHRONIC HTN: Reports none  Current Meds - lisinopril  Reports good compliance, took meds today. Tolerating well, w/o complaints. Lifestyle - walking  Denies CP, dyspnea, HA, edema, dizziness / lightheadedness  Anxiety  - Has been taking xanax as needed for anxiety - patient takes half a pill every day or as needed - difficult to clarify with patient  - Prescribed initially in 2016  - Patient states anxiety is well controlled with xanax - Denies any specific worries - again difficult to communicate with patient often parallel conversation through the interpretor  - discussed restarting Lexapro as controller medication - previously has been on this medication, not sure why it was discontinued- patient is not sure either.   Past Medical History Reviewed problem list.  Medications- reviewed and updated No additions to family history Social history- patient is a non- smoker  Objective: BP 120/60 (BP Location: Left Arm, Patient Position: Sitting, Cuff Size: Large)   Pulse 80   Temp 97.7 F (36.5 C) (Oral)   Ht 5\' 7"  (1.702 m)   Wt 185 lb 9.6 oz (84.2 kg)   BMI 29.07 kg/m  Gen: NAD, alert, cooperative with exam Cardio: regular rate and rhythm, S1S2 heard, no murmurs appreciated Pulm: clear to auscultation bilaterally, no wheezes, rhonchi or rales Extremities: warm, well perfused, No edema, cyanosis or clubbing;    Assessment/Plan: See problem based a/p Essential hypertension Well controlled, thought patient with concern that blood pressure is not well controlled- tried to reassure patient  Continue lisinopril   Anxiety state Anxiety, controlled  - Needs a GAD9, however no time at this office visit to go over each question - Communication during visit very poor - through an interpretor - often patient has to be asked multiple times before answering questions,  though agenda setting was performed  - Seems to be well controlled with xanax which is taken as needed daily, however would like restart Lexapro for controller medication - Follow up in 1 month

## 2017-01-17 NOTE — Assessment & Plan Note (Signed)
Well controlled, thought patient with concern that blood pressure is not well controlled- tried to reassure patient  Continue lisinopril

## 2017-01-17 NOTE — Assessment & Plan Note (Signed)
Anxiety, controlled  - Needs a GAD9, however no time at this office visit to go over each question - Communication during visit very poor - through an interpretor - often patient has to be asked multiple times before answering questions, though agenda setting was performed  - Seems to be well controlled with xanax which is taken as needed daily, however would like restart Lexapro for controller medication - Follow up in 1 month

## 2017-01-24 ENCOUNTER — Encounter: Payer: Self-pay | Admitting: Internal Medicine

## 2017-02-09 ENCOUNTER — Ambulatory Visit (INDEPENDENT_AMBULATORY_CARE_PROVIDER_SITE_OTHER): Payer: Medicare Other | Admitting: Internal Medicine

## 2017-02-09 ENCOUNTER — Encounter: Payer: Self-pay | Admitting: Internal Medicine

## 2017-02-09 DIAGNOSIS — G44219 Episodic tension-type headache, not intractable: Secondary | ICD-10-CM

## 2017-02-09 DIAGNOSIS — F411 Generalized anxiety disorder: Secondary | ICD-10-CM | POA: Diagnosis not present

## 2017-02-09 MED ORDER — ESCITALOPRAM OXALATE 10 MG PO TABS: 10.0000 mg | ORAL_TABLET | Freq: Every day | ORAL | 1 refills | Status: DC

## 2017-02-09 MED ORDER — ESCITALOPRAM OXALATE 10 MG PO TABS: 20.0000 mg | ORAL_TABLET | Freq: Every day | ORAL | 1 refills | Status: DC

## 2017-02-09 MED ORDER — ALPRAZOLAM 0.5 MG PO TABS: 0.5000 mg | ORAL_TABLET | Freq: Every evening | ORAL | 0 refills | Status: DC | PRN

## 2017-02-09 NOTE — Patient Instructions (Signed)
I want you to start a headache diary  - tell me when you have headache - duration of symptoms   Follow up with me in about 1 month if no improvement   For your anxiety we will follow up in 1 month as well

## 2017-02-09 NOTE — Progress Notes (Signed)
   [facility] Kerrin Mo, MD Phone: [phone]  Reason For Visit: Follow up for Anxiety and Headache   # Anxiety  Symptoms: patient feels like I have anxiety, nothing happening also having anxiety. Indicates feeling a little sad. Patient denies want to hurt himself or others Prescribed medication- was not taken Impact on function:  Psychiatric History - Diagnoses: Generalized anxiety disorder  - Pharmacotherapy: xanxa and Lexapro  - Outpatient therapy:   Family history of psychiatric issues: Current and history of substance use: Medical conditions that might explain or contribute to symptoms:  GAD7: 9-11, significant language barrier affecting patient using this evaluation   Headache   Onset: headaches for several years Frequency: Did not pay attention to how often he has been having headaches   Intensity: light pain around the head,  Duration: Not sure how long it lasts Site/Radition of Pain: Frontal headache  Temporal Pain:  Presence of Aura:  Precipitating factors:  Rarely take tylneol   ROS:  Fever/Weight loss: None  Neurologic Impairment:  Sudden onset (age > 63) : No, has had headaches when he was younger  Headache awakening from sleep: No  Nausea/Vomitting: No Worsening Progression of Headache: Feels like the pain has gotten worse  Trauma: None   Past Medical History Reviewed problem list.  Medications- reviewed and updated No additions to family history Social history- patient is a non- smoker  Objective: BP 128/70   Pulse 60   Temp 97.4 F (36.3 C) (Oral)   Ht 5\' 7"  (1.702 m)   Wt 182 lb (82.6 kg)   SpO2 98%   BMI 28.51 kg/m  Gen: NAD, alert, cooperative with exam Cardio: regular rate and rhythm, S1S2 heard, no murmurs appreciated Pulm: clear to auscultation bilaterally, no wheezes, rhonchi or rales Skin: dry, intact, no rashes or lesions Neuro: Cn 2-7 intact Strength equal & normal in upper & lower extremities Able  to walk on heels and toes.   Balance normal  [place] normal, finger to nose   Assessment/Plan: See problem based a/p   Anxiety state Anxiety - improved on Lexapro per patient  - Will continue Lexapro and Xanax as needed - provide patient with 2 months of prescription (1 pill per day or less)  - Patient does not increase the dose of lexapro as he feels this is the best dose for him  - Follow up in 3 months  Headache Tension type headache. No red flags - Tylenol as needed for headache  - Patient to provide a headache diary over the next month - onset, duration, severity of headaches  - Follow up in 1 month

## 2017-02-10 LAB — LIPID PANEL
Chol/HDL Ratio: 6.3 ratio — ABNORMAL HIGH (ref 0.0–5.0)
Cholesterol, Total: 207 mg/dL — ABNORMAL HIGH (ref 100–199)
HDL: 33 mg/dL — ABNORMAL LOW (ref >39)
LDL Calculated: 105 mg/dL — ABNORMAL HIGH (ref 0–99)
Triglycerides: 347 mg/dL — ABNORMAL HIGH (ref 0–149)
VLDL Cholesterol Cal: 69 mg/dL — ABNORMAL HIGH (ref 5–40)

## 2017-02-14 NOTE — Assessment & Plan Note (Signed)
Tension type headache. No red flags - Tylenol as needed for headache  - Patient to provide a headache diary over the next month - onset, duration, severity of headaches  - Follow up in 1 month

## 2017-02-14 NOTE — Assessment & Plan Note (Signed)
Anxiety - improved on Lexapro per patient  - Will continue Lexapro and Xanax as needed - provide patient with 2 months of prescription (1 pill per day or less)  - Patient does not increase the dose of lexapro as he feels this is the best dose for him  - Follow up in 3 months

## 2017-03-09 ENCOUNTER — Other Ambulatory Visit: Payer: Self-pay | Admitting: *Deleted

## 2017-03-09 DIAGNOSIS — F411 Generalized anxiety disorder: Secondary | ICD-10-CM

## 2017-03-09 NOTE — Telephone Encounter (Signed)
patient calling to request refill of:  Name of Medication(s):  alprazolam Last date of OV:  02/09/2017 Pharmacy:  Pt.to pick up  Will route refill request to Clinic RN.  Discussed with patient policy to call pharmacy for future refills.  Also, discussed refills may take up to 48 hours to approve or deny.  Patient states that he only has a ride today to pick up the script but is aware of the policy.  [employer],  Candace Ramus

## 2017-03-12 NOTE — Telephone Encounter (Signed)
Pt is calling to check the status of his request for Alprazolam. Epic shows that this was printed yesterday, but I didn't see this up front. Can we check where this might be or did we call or fax in. jw

## 2017-03-13 ENCOUNTER — Ambulatory Visit: Payer: Medicare Other | Admitting: Internal Medicine

## 2017-03-13 MED ORDER — ALPRAZOLAM 0.5 MG PO TABS: 0.5000 mg | ORAL_TABLET | Freq: Every evening | ORAL | 0 refills | Status: DC | PRN

## 2017-03-13 NOTE — Telephone Encounter (Signed)
Called in prescription, he should be able to pick it up later today

## 2017-03-23 ENCOUNTER — Ambulatory Visit (AMBULATORY_SURGERY_CENTER): Payer: Self-pay

## 2017-03-23 ENCOUNTER — Ambulatory Visit (INDEPENDENT_AMBULATORY_CARE_PROVIDER_SITE_OTHER): Payer: Medicare Other | Admitting: Internal Medicine

## 2017-03-23 VITALS — Ht 67.0 in | Wt 185.4 lb

## 2017-03-23 VITALS — BP 112/64 | HR 96 | Temp 97.9°F | Ht 67.0 in | Wt 184.4 lb

## 2017-03-23 DIAGNOSIS — R7309 Other abnormal glucose: Secondary | ICD-10-CM

## 2017-03-23 DIAGNOSIS — F411 Generalized anxiety disorder: Secondary | ICD-10-CM

## 2017-03-23 DIAGNOSIS — R7303 Prediabetes: Secondary | ICD-10-CM | POA: Insufficient documentation

## 2017-03-23 DIAGNOSIS — Z860100 Personal history of colon polyps, unspecified: Secondary | ICD-10-CM

## 2017-03-23 DIAGNOSIS — E785 Hyperlipidemia, unspecified: Secondary | ICD-10-CM

## 2017-03-23 LAB — POCT GLYCOSYLATED HEMOGLOBIN (HGB A1C): Hemoglobin A1C: 6.4

## 2017-03-23 MED ORDER — ALPRAZOLAM 0.5 MG PO TABS: 0.2500 mg | ORAL_TABLET | Freq: Every evening | ORAL | 0 refills | Status: DC | PRN

## 2017-03-23 MED ORDER — ATORVASTATIN CALCIUM 40 MG PO TABS: 40.0000 mg | ORAL_TABLET | Freq: Every day | ORAL | 3 refills | Status: DC

## 2017-03-23 MED ORDER — ASPIRIN 81 MG PO CHEW: 81.0000 mg | CHEWABLE_TABLET | Freq: Every day | ORAL | 2 refills | Status: DC

## 2017-03-23 MED ORDER — ALPRAZOLAM 0.5 MG PO TABS: 0.5000 mg | ORAL_TABLET | Freq: Every evening | ORAL | 0 refills | Status: DC | PRN

## 2017-03-23 MED ORDER — FLUOXETINE HCL 10 MG PO TABS: 10.0000 mg | ORAL_TABLET | Freq: Every day | ORAL | 3 refills | Status: DC

## 2017-03-23 MED ORDER — CETIRIZINE HCL 10 MG PO TABS: 10.0000 mg | ORAL_TABLET | Freq: Every day | ORAL | 0 refills | Status: DC

## 2017-03-23 NOTE — Progress Notes (Signed)
   Omar Sanders Kerrin Mo, MD Phone: [phone]  Reason For Visit: F/U Anxiety and HLD  # Anxiety  -Patient indicates that Lexapro gives him a headache and he does want to take this medication anymore -He would still like to continue the Xanax, states that he takes half a pill and this is helpful for him; he is currently taking as needed and I recommended he continue that rather than take a daily. -We also discussed that he would need another controller medication if he doesn't want to take the Lexapro. He agreed to trying Prozac for his anxiety. -Does not feel that his anxiety has worsened from our previous appointment.  HLD   - Not currently on a statin or aspirin. Some irritation of his stomach in the pas with taking aspirin  - Denies CP, dyspnea, HA, edema, dizziness / lightheadedness  Past Medical History Reviewed problem list.  Medications- reviewed and updated No additions to family history Social history- patient is a non- smoker  Objective: BP 112/64   Pulse 96   Temp 97.9 F (36.6 C) (Oral)   Ht 5\' 7"  (1.702 m)   Wt 184 lb 6.4 oz (83.6 kg)   SpO2 92%   BMI 28.88 kg/m  Gen: NAD, alert, cooperative with exam Cardio: regular rate and rhythm, S1S2 heard, no murmurs appreciated Pulm: clear to auscultation bilaterally, no wheezes, rhonchi or rales Skin: dry, intact, no rashes or lesions  Assessment/Plan: See problem based a/p  Hyperlipidemia ASCVD risk  29.2%  - Will start patient on Lipitor and baby aspirin - Discussed side effects with patient  - Follow up as needed   Prediabetes Remains prediabetic A1C 6.4; due to patient's hx of IBS will hold off on treatment with metformin for now   Anxiety state Switch from Lexapro to Prozac - low dose; lexapro caused headache  Continue xanxa - half a tablet as needed  Follow up in 1 month

## 2017-03-23 NOTE — Progress Notes (Signed)
No allergies to eggs or soy No past problems with anesthesia No home oxygen No past problems with anesthesia  Declined emmi

## 2017-03-23 NOTE — Assessment & Plan Note (Signed)
Switch from Lexapro to Prozac - low dose; lexapro caused headache  Continue xanxa - half a tablet as needed  Follow up in 1 month

## 2017-03-23 NOTE — Assessment & Plan Note (Signed)
Remains prediabetic A1C 6.4; due to patient's hx of IBS will hold off on treatment with metformin for now

## 2017-03-23 NOTE — Patient Instructions (Addendum)
Please stop the Lexapro. I will start you on fluoxetine to see if that helps with your anxiety. You can continue taking Xanax half a pill as needed for your anxiety. I want to to start taking Lipitor to help with your cholesterol.  Also see if you can tolerate a baby aspirin. Please follow up with me in 2 months.

## 2017-03-23 NOTE — Assessment & Plan Note (Signed)
ASCVD risk  29.2%  - Will start patient on Lipitor and baby aspirin - Discussed side effects with patient  - Follow up as needed

## 2017-03-27 ENCOUNTER — Telehealth: Payer: Self-pay | Admitting: Internal Medicine

## 2017-03-27 ENCOUNTER — Telehealth: Payer: Self-pay | Admitting: *Deleted

## 2017-03-27 NOTE — Telephone Encounter (Signed)
Pt states he needs a refill on medication that he hasn't used in awhile and doesn't know the name of. Pt believes it is for swelling. Pt states he still has swelling in both legs and needs the issue resolved before pt's colonoscopy appointment, on June 11th. Pt declined appointment, just would like PCP to call him. ep

## 2017-03-27 NOTE — Telephone Encounter (Signed)
Will forward to PCP.  Martin, Tamika L, RN  

## 2017-03-27 NOTE — Telephone Encounter (Signed)
Prior Authorization received from [place] for fluoxetine 10 mg.  PA form placed in provider box for completion. Derl Barrow, RN

## 2017-03-28 ENCOUNTER — Encounter: Payer: Self-pay | Admitting: Internal Medicine

## 2017-03-28 NOTE — Telephone Encounter (Signed)
PA for fluoxetine 10 mg faxed to [facility] for review. Review process could take 24-72 hours to complete.  Derl Barrow, RN

## 2017-03-29 NOTE — Telephone Encounter (Signed)
PA for fluoxetine is not required per [facility].  PA reference: [id number]. Pharmacy should call the help desk if having trouble processing the Rx.  Derl Barrow, RN

## 2017-03-30 ENCOUNTER — Telehealth: Payer: Self-pay | Admitting: Internal Medicine

## 2017-03-30 NOTE — Telephone Encounter (Signed)
Let pt know it is ok to continue the asa.

## 2017-03-30 NOTE — Telephone Encounter (Signed)
Patient called again today needing to speak with PCP regarding bilateral leg swelling.  Pt suppose to a procedure done 04/09/17 at [facility] clinic. He is needing to know if he need to take medication again or their is no cause for concern.  Pt is requesting PCP return his call. Pt is unable to come into clinic for an appointment due to transportation issues.  Derl Barrow, RN

## 2017-04-02 ENCOUNTER — Telehealth: Payer: Self-pay | Admitting: Internal Medicine

## 2017-04-02 NOTE — Telephone Encounter (Signed)
Pt states both of his legs are swollen. Discussed with pt that he should contact his PCP regarding the swelling. Pt wanted to know if he can keep his appt for procedure, discussed with him that he should be able to keep the appt as scheduled but to follow-up with PCP.

## 2017-04-02 NOTE — Telephone Encounter (Signed)
His bilaterally leg swelling is likely from venous stasis. Please call patient and let him know, compression stockings and elevating the legs are all he needs to do. We can further evaluate at this next appointment.  Omar Sanders

## 2017-04-09 ENCOUNTER — Ambulatory Visit (AMBULATORY_SURGERY_CENTER): Payer: Medicare Other | Admitting: Internal Medicine

## 2017-04-09 ENCOUNTER — Telehealth: Payer: Self-pay

## 2017-04-09 ENCOUNTER — Telehealth: Payer: Self-pay | Admitting: Internal Medicine

## 2017-04-09 ENCOUNTER — Encounter: Payer: Self-pay | Admitting: Internal Medicine

## 2017-04-09 VITALS — BP 108/63 | HR 56 | Temp 96.9°F | Resp 12 | Ht 67.0 in | Wt 185.0 lb

## 2017-04-09 DIAGNOSIS — Z86010 Personal history of colonic polyps: Secondary | ICD-10-CM | POA: Diagnosis present

## 2017-04-09 DIAGNOSIS — D124 Benign neoplasm of descending colon: Secondary | ICD-10-CM | POA: Diagnosis not present

## 2017-04-09 DIAGNOSIS — K635 Polyp of colon: Secondary | ICD-10-CM

## 2017-04-09 DIAGNOSIS — K219 Gastro-esophageal reflux disease without esophagitis: Secondary | ICD-10-CM

## 2017-04-09 DIAGNOSIS — D123 Benign neoplasm of transverse colon: Secondary | ICD-10-CM

## 2017-04-09 DIAGNOSIS — K2100 Gastro-esophageal reflux disease with esophagitis, without bleeding: Secondary | ICD-10-CM

## 2017-04-09 DIAGNOSIS — D125 Benign neoplasm of sigmoid colon: Secondary | ICD-10-CM

## 2017-04-09 DIAGNOSIS — Z1211 Encounter for screening for malignant neoplasm of colon: Secondary | ICD-10-CM | POA: Diagnosis not present

## 2017-04-09 MED ORDER — OMEPRAZOLE 40 MG PO CPDR
40.0000 mg | DELAYED_RELEASE_CAPSULE | Freq: Every day | ORAL | 11 refills | Status: DC
Start: 2017-04-09 — End: 2017-07-10

## 2017-04-09 MED ORDER — SODIUM CHLORIDE 0.9 % IV SOLN: 500.0000 mL | INTRAVENOUS | Status: DC

## 2017-04-09 NOTE — Op Note (Signed)
[facility] Patient Name: Omar Sanders Procedure Date: 04/09/2017 11:43 AM MRN: [id number] Endoscopist: Docia Chuck. Henrene Pastor , MD Age: 76 Referring MD:  Date of Birth: December 29, 1940 Gender: Male Account #: [id number] Procedure:                Upper GI endoscopy Indications:              Dyspepsia, Heartburn Medicines:                Monitored Anesthesia Care Procedure:                Pre-Anesthesia Assessment:                           - Prior to the procedure, a History and Physical                            was performed, and patient medications and                            allergies were reviewed. The patient's tolerance of                            previous anesthesia was also reviewed. The risks                            and benefits of the procedure and the sedation                            options and risks were discussed with the patient.                            All questions were answered, and informed consent                            was obtained. Prior Anticoagulants: The patient has                            taken no previous anticoagulant or antiplatelet                            agents. ASA Grade Assessment: II - A patient with                            mild systemic disease. After reviewing the risks                            and benefits, the patient was deemed in                            satisfactory condition to undergo the procedure.                           After obtaining informed consent, the endoscope was  passed under direct vision. Throughout the                            procedure, the patient's blood pressure, pulse, and                            oxygen saturations were monitored continuously. The                            Endoscope was introduced through the mouth, and                            advanced to the second part of duodenum. The upper                            GI endoscopy was accomplished without  difficulty.                            The patient tolerated the procedure well. Scope In: Scope Out: Findings:                 LA Grade A (one or more mucosal breaks less than 5                            mm, not extending between tops of 2 mucosal folds)                            esophagitis was found.                           The esophagus was otherwise normal.                           The stomach was normal.                           The examined duodenum was normal.                           The cardia and gastric fundus were normal on                            retroflexion. Complications:            No immediate complications. Estimated Blood Loss:     Estimated blood loss: none. Impression:               - LA Grade A reflux esophagitis.                           - Normal esophagus otherwise.                           - Normal stomach.                           - Normal examined duodenum.                           -  No specimens collected. Recommendation:           - Patient has a contact number available for                            emergencies. The signs and symptoms of potential                            delayed complications were discussed with the                            patient. Return to normal activities tomorrow.                            Written discharge instructions were provided to the                            patient.                           - Resume previous diet.                           - Continue present medications.                           - Prescribe omeprazole 40 mg daily; #30; 11 refills                           - Return to the care of your primary provider Docia Chuck. Henrene Pastor, MD 04/09/2017 11:56:04 AM This report has been signed electronically.

## 2017-04-09 NOTE — Telephone Encounter (Signed)
Patient son requesting omeprazole refill.

## 2017-04-09 NOTE — Patient Instructions (Signed)
YOU HAD AN ENDOSCOPIC PROCEDURE TODAY AT [facility]:   Refer to the procedure report that was given to you for any specific questions about what was found during the examination.  If the procedure report does not answer your questions, please call your gastroenterologist to clarify.  If you requested that your care partner not be given the details of your procedure findings, then the procedure report has been included in a sealed envelope for you to review at your convenience later.  YOU SHOULD EXPECT: Some feelings of bloating in the abdomen. Passage of more gas than usual.  Walking can help get rid of the air that was put into your GI tract during the procedure and reduce the bloating. If you had a lower endoscopy (such as a colonoscopy or flexible sigmoidoscopy) you may notice spotting of blood in your stool or on the toilet paper. If you underwent a bowel prep for your procedure, you may not have a normal bowel movement for a few days.  Please Note:  You might notice some irritation and congestion in your nose or some drainage.  This is from the oxygen used during your procedure.  There is no need for concern and it should clear up in a day or so.  SYMPTOMS TO REPORT IMMEDIATELY:   Following lower endoscopy (colonoscopy or flexible sigmoidoscopy):  Excessive amounts of blood in the stool  Significant tenderness or worsening of abdominal pains  Swelling of the abdomen that is new, acute  Fever of 100F or higher   Following upper endoscopy (EGD)  Vomiting of blood or coffee ground material  New chest pain or pain under the shoulder blades  Painful or persistently difficult swallowing  New shortness of breath  Fever of 100F or higher  Black, tarry-looking stools  For urgent or emergent issues, a gastroenterologist can be reached at any hour by calling [phone].   DIET:  We do recommend a small meal at first, but then you may proceed to your regular diet.  Drink  plenty of fluids but you should avoid alcoholic beverages for 24 hours.  MEDICATIONS:  Take Omeprazole 40 mg by mouth daily, 30 tablets, 11 refills. Otherwise, continue present medications.  Return to the care of your primary provider.  ACTIVITY:  You should plan to take it easy for the rest of today and you should NOT DRIVE or use heavy machinery until tomorrow (because of the sedation medicines used during the test).    FOLLOW UP: Our staff will call the number listed on your records the next business day following your procedure to check on you and address any questions or concerns that you may have regarding the information given to you following your procedure. If we do not reach you, we will leave a message.  However, if you are feeling well and you are not experiencing any problems, there is no need to return our call.  We will assume that you have returned to your regular daily activities without incident.  If any biopsies were taken you will be contacted by phone or by letter within the next 1-3 weeks.  Please call us at [phone] if you have not heard about the biopsies in 3 weeks.   Thank you for allowing [place] to provide for your healthcare needs today.   SIGNATURES/CONFIDENTIALITY: You and/or your care partner have signed paperwork which will be entered into your electronic medical record.  These signatures attest to the fact that that the information  above on your After Visit Summary has been reviewed and is understood.  Full responsibility of the confidentiality of this discharge information lies with you and/or your care-partner. 

## 2017-04-09 NOTE — Op Note (Signed)
[facility] Patient Name: Omar Sanders Procedure Date: 04/09/2017 11:06 AM MRN: [id number] Endoscopist: Docia Chuck. Henrene Pastor , MD Age: 76 Referring MD:  Date of Birth: 1941-01-23 Gender: Male Account #: [id number] Procedure:                Colonoscopy, with cold snare polypectomy x 4 Indications:              High risk colon cancer surveillance: Personal                            history of non-advanced adenoma. Previous                            examinations with Dr. Collene Mares 2002 (negative), 2008                            (tubular adenoma) in 2012 (?) Medicines:                Monitored Anesthesia Care Procedure:                Pre-Anesthesia Assessment:                           - Prior to the procedure, a History and Physical                            was performed, and patient medications and                            allergies were reviewed. The patient's tolerance of                            previous anesthesia was also reviewed. The risks                            and benefits of the procedure and the sedation                            options and risks were discussed with the patient.                            All questions were answered, and informed consent                            was obtained. Prior Anticoagulants: The patient has                            taken no previous anticoagulant or antiplatelet                            agents. ASA Grade Assessment: II - A patient with                            mild systemic disease. After reviewing the risks  and benefits, the patient was deemed in                            satisfactory condition to undergo the procedure.                           After obtaining informed consent, the colonoscope                            was passed under direct vision. Throughout the                            procedure, the patient's blood pressure, pulse, and                            oxygen  saturations were monitored continuously. The                            Model PCF-[id number]) scope was introduced                            through the anus and advanced to the the cecum,                            identified by appendiceal orifice and ileocecal                            valve. The ileocecal valve, appendiceal orifice,                            and rectum were photographed. The quality of the                            bowel preparation was excellent. The colonoscopy                            was performed without difficulty. The patient                            tolerated the procedure well. The bowel preparation                            used was SUPREP. Scope In: 11:25:49 AM Scope Out: 11:41:35 AM Scope Withdrawal Time: 0 hours 12 minutes 23 seconds  Total Procedure Duration: 0 hours 15 minutes 46 seconds  Findings:                 Four polyps were found in the sigmoid colon,                            descending colon and transverse colon. The polyps                            were 3 to 5 mm in size. These polyps were removed  with a cold snare. Resection and retrieval were                            complete.                           The exam was otherwise without abnormality on                            direct and retroflexion views. Complications:            No immediate complications. Estimated blood loss:                            None. Estimated Blood Loss:     Estimated blood loss: none. Impression:               - Four 3 to 5 mm polyps in the sigmoid colon, in                            the descending colon and in the transverse colon,                            removed with a cold snare. Resected and retrieved.                           - The examination was otherwise normal on direct                            and retroflexion views. Recommendation:           - Repeat colonoscopy in 3 - 5 years for                             surveillance.                           - Patient has a contact number available for                            emergencies. The signs and symptoms of potential                            delayed complications were discussed with the                            patient. Return to normal activities tomorrow.                            Written discharge instructions were provided to the                            patient.                           - Resume previous diet.                           -  Continue present medications.                           - Await pathology results. Docia Chuck. Henrene Pastor, MD 04/09/2017 11:45:14 AM This report has been signed electronically.

## 2017-04-09 NOTE — Progress Notes (Signed)
Called to room to assist during endoscopic procedure.  Patient ID and intended procedure confirmed with present staff. Received instructions for my participation in the procedure from the performing physician.  

## 2017-04-09 NOTE — Progress Notes (Signed)
Report to PACU, RN, vss, BBS= Clear.  

## 2017-04-09 NOTE — Progress Notes (Signed)
Pt's son said that his father wants to see what mg the omeprazole he tried before that is at home.  Pt's son, Omar Sanders, will call me back and let me know what strength pt was taking prior to this procedure today.  Pt said he thinks the omeprazole caused him to cough.  Pt's son asked me to not send in rx today until he calls me back today.   Dr. Henrene Pastor came back to the endo floor and I asked him about the mg.  Per Dr. Henrene Pastor, pt was on omeprazole 20 mg prior and he wants him to increase to 40 mg daily for his esophagitis.  maw

## 2017-04-09 NOTE — Telephone Encounter (Signed)
Patient had EGD and colonoscopy this am. Dr. Henrene Pastor had prescribed Omeprazole 40mg  po daily. Patient stated at time of discharge in recovery that he had taken Omeprazole and Protonix previously and it caused increase reflux and coughing. Spoke with Dr. Henrene Pastor. Dr. Henrene Pastor states patient had been on Omeprazole 20mg  dose and it's unlikely the Omeprazole was causing increased reflux and coughing, most likely due to bloating from IBS. Informed patient of the above and that Dr. Henrene Pastor would like to try this prescribed dose to given him relief and protect from the esophagitis. Patient verbalized understanding. Prescription sent electronically to patient's preferred pharmacy. Patient instructed to call Dr. Blanch Media office if he has any further questions/concerns regarding his care and if he has any problems taking this new dose of Omeprazole.

## 2017-04-09 NOTE — Progress Notes (Signed)
Pt's states no medical or surgical changes since previsit or office visit. 

## 2017-04-09 NOTE — Telephone Encounter (Signed)
rx for Omeprazole 40mg  sent to pharmacy

## 2017-04-10 ENCOUNTER — Telehealth: Payer: Self-pay

## 2017-04-10 NOTE — Telephone Encounter (Signed)
  Follow up Call-  Call back number 04/09/2017  Post procedure Call Back phone  # [phone]  Permission to leave phone message Yes  Some recent data might be hidden     Patient questions:  Do you have a fever, pain , or abdominal swelling? No. Pain Score  0 *  Have you tolerated food without any problems? Yes.    Have you been able to return to your normal activities? Yes.    Do you have any questions about your discharge instructions: Diet   No. Medications  No. Follow up visit  No.  Do you have questions or concerns about your Care? No.  Actions: * If pain score is 4 or above: No action needed, pain <4.

## 2017-04-11 ENCOUNTER — Telehealth: Payer: Self-pay | Admitting: Internal Medicine

## 2017-04-11 NOTE — Telephone Encounter (Signed)
Spoke with pt and let him know that we recommend Miralax. Pt states it does not work for him. Pt reports he has "gentle laxative" and wants to try it. Discussed with pt that he can try that laxative and to take as box directs. Pt verbalized understanding.

## 2017-04-18 ENCOUNTER — Encounter: Payer: Self-pay | Admitting: Internal Medicine

## 2017-04-20 ENCOUNTER — Telehealth: Payer: Self-pay | Admitting: Internal Medicine

## 2017-04-20 NOTE — Telephone Encounter (Signed)
Pt called because he is unsure about his xanax medication. It show that he can pick up his medication on 04/23/17. I am not he understands this or if he turned in the prescription. Can we call and explain the situation to him. Blima Rich

## 2017-05-01 NOTE — Telephone Encounter (Signed)
Called pharmacy, rx was last filled on 6/17 so its likely patient was trying to fill rx too early. Left message for patient to call back.

## 2017-05-11 ENCOUNTER — Encounter: Payer: Self-pay | Admitting: Internal Medicine

## 2017-05-11 ENCOUNTER — Ambulatory Visit (INDEPENDENT_AMBULATORY_CARE_PROVIDER_SITE_OTHER): Payer: Medicare Other | Admitting: Internal Medicine

## 2017-05-11 VITALS — BP 125/60 | HR 70 | Temp 97.6°F | Ht 67.0 in | Wt 186.2 lb

## 2017-05-11 DIAGNOSIS — J302 Other seasonal allergic rhinitis: Secondary | ICD-10-CM

## 2017-05-11 DIAGNOSIS — I10 Essential (primary) hypertension: Secondary | ICD-10-CM | POA: Diagnosis not present

## 2017-05-11 DIAGNOSIS — M7989 Other specified soft tissue disorders: Secondary | ICD-10-CM | POA: Diagnosis not present

## 2017-05-11 MED ORDER — LORATADINE 10 MG PO TABS: 10.0000 mg | ORAL_TABLET | Freq: Every day | ORAL | 11 refills | Status: DC

## 2017-05-11 MED ORDER — OLOPATADINE HCL 0.2 % OP SOLN: 1.0000 [drp] | Freq: Every day | OPHTHALMIC | 5 refills | Status: DC

## 2017-05-11 MED ORDER — FLUTICASONE PROPIONATE 50 MCG/ACT NA SUSP: 2.0000 | Freq: Every day | NASAL | 6 refills | Status: DC

## 2017-05-11 NOTE — Assessment & Plan Note (Signed)
Well controlled  Continue lisinopril for kidney protection

## 2017-05-11 NOTE — Assessment & Plan Note (Signed)
Liver vs. Heart vs. Venous stasis - has stage 3 CKD, though unlikely to be contributing to leg swelling, hx of slight anemia (hx of recent (2018) coloscopy with polyp removal) unlikely contributing significantly  - CMP14+EGFR - Brain natriuretic peptide - CBC

## 2017-05-11 NOTE — Assessment & Plan Note (Signed)
-   loratadine (CLARITIN) 10 MG tablet; Take 1 tablet (10 mg total) by mouth daily.  Dispense: 30 tablet; Refill: 11 - fluticasone (FLONASE) 50 MCG/ACT nasal spray; Place 2 sprays into both nostrils daily.  Dispense: 16 g; Refill: 6 - Olopatadine HCl 0.2 % SOLN; Apply 1 drop to eye daily.  Dispense: 2.5 mL; Refill: 5

## 2017-05-11 NOTE — Progress Notes (Signed)
   [facility] Kerrin Mo, MD Phone: [phone]  Reason For Visit: F/U  # Allergies  - Last time patient was here started you on cetirizine  - Still having nasal congestion as well as itchy and watery eyes  - Has a hx of allergies in the winter, this year has continued to have allergies   # Leg swelling  - Has had leg swelling for several years - Swelling does not change - same in the morning and at night  - Stays right there in the feet- does not go further up  - Denies having PND or othropnea, indicates propping himself up on pillows- he does because he sometimes has stomach pain, Indicates dyspnea with exertion. Denies any chest pain   # CHRONIC HTN: Reports lisinopril  Reports good compliance, took meds today. Tolerating well, w/o complaints. Denies CP, dyspnea, HA, edema, dizziness / lightheadedness  Past Medical History Reviewed problem list.  Medications- reviewed and updated No additions to family history Social history- patient is a non -smoker  Objective: BP 125/60 (BP Location: Left Arm, Patient Position: Sitting, Cuff Size: Normal)   Pulse 70   Temp 97.6 F (36.4 C) (Oral)   Ht _0  (1.702 m)   Wt 186 lb 3.2 oz (84.5 kg)   SpO2 94%   BMI 29.16 kg/m  Gen: NAD, alert, cooperative with exam HEENT: Normal    Neck: No masses palpated. No lymphadenopathy    Eyes: PERRLA, EOMI    Nose: nasal turbinates erythematous     Throat: moist mucus membranes, no erythema Cardio: regular rate and rhythm, S1S2 heard, no murmurs appreciated Pulm: clear to auscultation bilaterally, no wheezes, rhonchi or rales Extremities: warm, well perfused, 3+ bilateral pitting edema to mid-thigh, no pain with dorsiflexion or calf tenderness, no skin changes  Assessment/Plan: See problem based a/p  Leg swelling Liver vs. Heart vs. Venous stasis - has stage 3 CKD, though unlikely to be contributing to leg swelling, hx of slight anemia (hx of recent (2018)  coloscopy with polyp removal) unlikely contributing significantly  - CMP14+EGFR - Brain natriuretic peptide - CBC   Seasonal allergies - loratadine (CLARITIN) 10 MG tablet; Take 1 tablet (10 mg total) by mouth daily.  Dispense: 30 tablet; Refill: 11 - fluticasone (FLONASE) 50 MCG/ACT nasal spray; Place 2 sprays into both nostrils daily.  Dispense: 16 g; Refill: 6 - Olopatadine HCl 0.2 % SOLN; Apply 1 drop to eye daily.  Dispense: 2.5 mL; Refill: 5    Essential hypertension Well controlled  Continue lisinopril for kidney protection

## 2017-05-11 NOTE — Patient Instructions (Signed)
I am going to check on your labs to make sure that your heart and liver look good. If there is nothing wrong on your labs you  likely have something called venous stasis which is treated with leg elevation and compression stockings. For your allergies we are going to change your to loratadine add Flonase to help with nasal congestion and Pataday to help with itchy eyes. Please follow up with me in 1 month.

## 2017-05-12 LAB — CMP14+EGFR
ALT: 23 IU/L (ref 0–44)
AST: 22 IU/L (ref 0–40)
Albumin/Globulin Ratio: 1.4 (ref 1.2–2.2)
Albumin: 4.2 g/dL (ref 3.5–4.8)
Alkaline Phosphatase: 58 IU/L (ref 39–117)
BUN/Creatinine Ratio: 14 (ref 10–24)
BUN: 25 mg/dL (ref 8–27)
Bilirubin Total: 0.3 mg/dL (ref 0.0–1.2)
CO2: 24 mmol/L (ref 20–29)
Calcium: 9.5 mg/dL (ref 8.6–10.2)
Chloride: 99 mmol/L (ref 96–106)
Creatinine, Ser: 1.77 mg/dL — ABNORMAL HIGH (ref 0.76–1.27)
GFR calc Af Amer: 42 mL/min/{1.73_m2} — ABNORMAL LOW (ref >59)
GFR calc non Af Amer: 36 mL/min/{1.73_m2} — ABNORMAL LOW (ref >59)
Globulin, Total: 3.1 g/dL (ref 1.5–4.5)
Glucose: 155 mg/dL — ABNORMAL HIGH (ref 65–99)
Potassium: 4.1 mmol/L (ref 3.5–5.2)
Sodium: 138 mmol/L (ref 134–144)
Total Protein: 7.3 g/dL (ref 6.0–8.5)

## 2017-05-12 LAB — CBC
Hematocrit: 37.7 % (ref 37.5–51.0)
Hemoglobin: 12.2 g/dL — ABNORMAL LOW (ref 13.0–17.7)
MCH: 26.6 pg (ref 26.6–33.0)
MCHC: 32.4 g/dL (ref 31.5–35.7)
MCV: 82 fL (ref 79–97)
Platelets: 205 10*3/uL (ref 150–379)
RBC: 4.59 x10E6/uL (ref 4.14–5.80)
RDW: 15.1 % (ref 12.3–15.4)
WBC: 5.4 10*3/uL (ref 3.4–10.8)

## 2017-05-12 LAB — BRAIN NATRIURETIC PEPTIDE: BNP: 14.2 pg/mL (ref 0.0–100.0)

## 2017-05-15 ENCOUNTER — Encounter: Payer: Self-pay | Admitting: Internal Medicine

## 2017-05-15 ENCOUNTER — Telehealth: Payer: Self-pay | Admitting: Internal Medicine

## 2017-05-15 NOTE — Telephone Encounter (Signed)
Please call patient and makes sure that he makes appointment within next month as we need to recheck his kidney function.

## 2017-05-22 NOTE — Telephone Encounter (Signed)
Patient has an appointment on 06-01-17. Monita Swier,CMA

## 2017-06-01 ENCOUNTER — Ambulatory Visit (INDEPENDENT_AMBULATORY_CARE_PROVIDER_SITE_OTHER): Payer: Medicare Other | Admitting: Family Medicine

## 2017-06-01 VITALS — BP 122/70 | HR 73 | Temp 98.0°F | Ht 67.0 in | Wt 186.0 lb

## 2017-06-01 DIAGNOSIS — M7989 Other specified soft tissue disorders: Secondary | ICD-10-CM | POA: Diagnosis not present

## 2017-06-01 DIAGNOSIS — N401 Enlarged prostate with lower urinary tract symptoms: Secondary | ICD-10-CM | POA: Diagnosis not present

## 2017-06-01 DIAGNOSIS — N183 Chronic kidney disease, stage 3 unspecified: Secondary | ICD-10-CM

## 2017-06-01 DIAGNOSIS — R3912 Poor urinary stream: Secondary | ICD-10-CM

## 2017-06-01 DIAGNOSIS — R140 Abdominal distension (gaseous): Secondary | ICD-10-CM | POA: Diagnosis not present

## 2017-06-01 DIAGNOSIS — K5900 Constipation, unspecified: Secondary | ICD-10-CM | POA: Diagnosis not present

## 2017-06-01 DIAGNOSIS — R109 Unspecified abdominal pain: Secondary | ICD-10-CM

## 2017-06-01 LAB — POCT URINALYSIS DIP (MANUAL ENTRY)
Bilirubin, UA: NEGATIVE
Blood, UA: NEGATIVE
Glucose, UA: NEGATIVE mg/dL
Ketones, POC UA: NEGATIVE mg/dL
Leukocytes, UA: NEGATIVE
Nitrite, UA: NEGATIVE
Protein Ur, POC: NEGATIVE mg/dL
Spec Grav, UA: 1.01 (ref 1.010–1.025)
Urobilinogen, UA: 0.2 E.U./dL
pH, UA: 7 (ref 5.0–8.0)

## 2017-06-01 NOTE — Patient Instructions (Addendum)
It was great to meet you today! Thank you for letting me participate in your care!  Today, we discussed your leg swelling.   We also got blood work to check your kidney function. We will keep a close watch on how your kidneys continue to function and make recommendations accordingly. Maintain a low salt diet and keep taking your medications as directed. Use your compression socks everyday.   Please start taking Miralax to help with your constipation. Take it once a day as directed until your bowel movements become normal.  We will also get blood work, an abdominal ultrasound, and check your urine to make sure no other problems are causing your leg swelling. We will call you with any abnormal results.  Be well, Harolyn Rutherford, DO PGY-1, [facility]

## 2017-06-01 NOTE — Assessment & Plan Note (Signed)
Likely due to combination of venous stasis and CKD Stage III. Liver enzymes normal on last check. BNP normal.  Rechecking CMP+EGFR due to increasing Creatnine and decreasing GFR with symptoms.  Advised patient to get compression stocking/socks.   Checked TSH for possible thyroid disease

## 2017-06-01 NOTE — Assessment & Plan Note (Signed)
Due to patient having increased frequency and some pain on urination since last visit I ordered U/A.   Continue finasteride. Ordered a PSA to see if condition is worsening.

## 2017-06-01 NOTE — Assessment & Plan Note (Signed)
Unclear etiology. Liver enzymes normal, no fluid wave on exam. Ordered abdominal ultrasound to help rule out potential liver pathology.

## 2017-06-01 NOTE — Assessment & Plan Note (Signed)
Patient is taking stool softener which is helping. I ordered Miralax for the patient but he has tried it in the past and said it doesn't help and didn't want to try it again. I asked permission to perform a rectal exam due to patient stating he had blood in his stool after passing a hard stool but patient refused.  I informed patient when he returns in 1 month if he is still having blood in the stool we will need to perform a rectal exam. He expressed understanding and agreement with this plan.

## 2017-06-01 NOTE — Assessment & Plan Note (Signed)
Patient has worsening kidney function. Got repeat CMP+EFGR and will have him follow up in one week. Patient has symptoms of leg swelling as well.  Consider referral to Nephrology if symptoms continue and kidney function does not improve.

## 2017-06-01 NOTE — Progress Notes (Signed)
Subjective: No chief complaint on file.    HPI: Omar Sanders is a 76 y.o. presenting to clinic today to discuss the following:  1 Leg Swelling 2 Constipation with abdominal distention 3 Urinary frequency and low urinary volume  Omar Sanders is endorsing 6 months of bilateral leg swelling with associated pain and numbness. He could not describe the pain, just that at times "it hurts". It gets better with elevation and rest. He has stage III CKD. No skin break down. It has not gotten worse since his last visit.  He is also having constipation with abdominal distention that is ongoing that he says a stool softener is helping and also drinking a certain tea is helping his symptoms. He is having intermittent hard stools and is going infrequently. When he has hard stools he notices small amounts of blood but only when he has hard stool. Patient declined a rectal exam.  He is having urinary frequency, pain, and low urine output that he states is getting worse. He has known BPH and it is likely his prostate is enlarging. Patient declined a rectal exam.  Health Maintenance: none today     ROS noted in HPI.   Past Medical, Surgical, Social, and Family History Reviewed & Updated per EMR.   Pertinent Historical Findings include:   History  Smoking Status  . Never Smoker  Smokeless Tobacco  . Never Used      Objective: BP 122/70   Pulse 73   Temp 98 F (36.7 C) (Oral)   Ht 5' 7"  (1.702 m)   Wt 186 lb (84.4 kg)   SpO2 91%   BMI 29.13 kg/m  Vitals and nursing notes reviewed  Physical Exam   Gen: Alert and Oriented x 3, NAD HEENT: Normocephalic, atraumatic, PERRLA, EOMI Neck: trachea midline, no thyroidmegaly, no LAD Resp: CTAB, no wheezing, rales, or rhonchi, comfortable work of breathing CV: RRR, no murmurs, normal S1, S2 split, +2 pulses dorsalis pedis bilaterally Abd: mildly distended, tender to palpation RUQ and LLQ, soft, +bs in all four quadrants, no fluid wave  hepatosplenomegaly Genitourinary: Patient declined rectal exam MSK: FROM in all four extremities Ext: no clubbing, cyanosis, +2 pitting edema bilaterally Psych: appropriate behavior, mood   No results found for this or any previous visit (from the past 72 hour(s)).  Assessment/Plan:  Leg swelling Likely due to combination of venous stasis and CKD Stage III. Liver enzymes normal on last check. BNP normal.  Rechecking CMP+EGFR due to increasing Creatnine and decreasing GFR with symptoms.  Advised patient to get compression stocking/socks.   Checked TSH for possible thyroid disease  Chronic kidney disease Patient has worsening kidney function. Got repeat CMP+EFGR and will have him follow up in one week. Patient has symptoms of leg swelling as well.  Consider referral to Nephrology if symptoms continue and kidney function does not improve.  Abdominal distention, non-gaseous Unclear etiology. Liver enzymes normal, no fluid wave on exam. Ordered abdominal ultrasound to help rule out potential liver pathology.  Constipation Patient is taking stool softener which is helping. I ordered Miralax for the patient but he has tried it in the past and said it doesn't help and didn't want to try it again. I asked permission to perform a rectal exam due to patient stating he had blood in his stool after passing a hard stool but patient refused.  I informed patient when he returns in 1 month if he is still having blood in the stool we  will need to perform a rectal exam. He expressed understanding and agreement with this plan.  BPH (benign prostatic hyperplasia) Due to patient having increased frequency and some pain on urination since last visit I ordered U/A.   Continue finasteride. Ordered a PSA to see if condition is worsening.    Please see problem based Assessment and Plan PATIENT EDUCATION PROVIDED: See AVS    Diagnosis and plan along with any newly prescribed medication(s) were  discussed in detail with this patient today. The patient verbalized understanding and agreed with the plan. Patient advised if symptoms worsen return to clinic or ER.   Health Maintainance:   No orders of the defined types were placed in this encounter.   No orders of the defined types were placed in this encounter.    Harolyn Rutherford, DO 06/01/2017, 10:03 AM PGY-1, [facility]

## 2017-06-02 LAB — CMP14+EGFR
ALT: 18 IU/L (ref 0–44)
AST: 20 IU/L (ref 0–40)
Albumin/Globulin Ratio: 1.3 (ref 1.2–2.2)
Albumin: 4.2 g/dL (ref 3.5–4.8)
Alkaline Phosphatase: 62 IU/L (ref 39–117)
BUN/Creatinine Ratio: 12 (ref 10–24)
BUN: 19 mg/dL (ref 8–27)
Bilirubin Total: 0.4 mg/dL (ref 0.0–1.2)
CO2: 24 mmol/L (ref 20–29)
Calcium: 9.8 mg/dL (ref 8.6–10.2)
Chloride: 100 mmol/L (ref 96–106)
Creatinine, Ser: 1.55 mg/dL — ABNORMAL HIGH (ref 0.76–1.27)
GFR calc Af Amer: 49 mL/min/{1.73_m2} — ABNORMAL LOW (ref >59)
GFR calc non Af Amer: 43 mL/min/{1.73_m2} — ABNORMAL LOW (ref >59)
Globulin, Total: 3.3 g/dL (ref 1.5–4.5)
Glucose: 119 mg/dL — ABNORMAL HIGH (ref 65–99)
Potassium: 4 mmol/L (ref 3.5–5.2)
Sodium: 138 mmol/L (ref 134–144)
Total Protein: 7.5 g/dL (ref 6.0–8.5)

## 2017-06-02 LAB — TSH: TSH: 3.59 u[IU]/mL (ref 0.450–4.500)

## 2017-06-02 LAB — PSA: Prostate Specific Ag, Serum: 3.7 ng/mL (ref 0.0–4.0)

## 2017-06-08 ENCOUNTER — Ambulatory Visit (HOSPITAL_COMMUNITY)
Admission: RE | Admit: 2017-06-08 | Discharge: 2017-06-08 | Disposition: A | Discharge status: Home / Self Care | Payer: Medicare Other | Source: Ambulatory Visit | Attending: Family Medicine | Admitting: Family Medicine

## 2017-06-08 DIAGNOSIS — Z9049 Acquired absence of other specified parts of digestive tract: Secondary | ICD-10-CM | POA: Diagnosis not present

## 2017-06-08 DIAGNOSIS — N183 Chronic kidney disease, stage 3 unspecified: Secondary | ICD-10-CM

## 2017-06-08 DIAGNOSIS — N281 Cyst of kidney, acquired: Secondary | ICD-10-CM | POA: Insufficient documentation

## 2017-06-08 DIAGNOSIS — K769 Liver disease, unspecified: Secondary | ICD-10-CM | POA: Diagnosis not present

## 2017-06-08 DIAGNOSIS — R140 Abdominal distension (gaseous): Secondary | ICD-10-CM | POA: Diagnosis not present

## 2017-06-13 ENCOUNTER — Telehealth: Payer: Self-pay | Admitting: *Deleted

## 2017-06-13 NOTE — Telephone Encounter (Signed)
Patient called requesting results from last office visit.  Please call [phone]. Derl Barrow, RN

## 2017-06-14 ENCOUNTER — Telehealth: Payer: Self-pay | Admitting: Family Medicine

## 2017-06-14 NOTE — Telephone Encounter (Signed)
Called patient and spoke to him about his test results. Thanks!

## 2017-06-14 NOTE — Telephone Encounter (Signed)
Called patient and informed him his liver ultrasound was within normal limits. His lab work was [employer] for any acute process. Patient was instructed to keep low salt diet, elevate legs at night, and to buy compression stockings from [facility] to help with his leg swelling. He expressed understanding and agreement with plan.

## 2017-06-15 ENCOUNTER — Telehealth: Payer: Self-pay | Admitting: *Deleted

## 2017-06-15 NOTE — Telephone Encounter (Signed)
Patient left message on nurse line regarding med refill and compression stockings. Returned patient's call. Asked me to call [facility] on his behalf for medication for his stomach. Spoke with [employer]. Patient has multiple refills on omeprazole. Will get that ready for him. Also, they do have compression stocking that he can purchase OTC without Rx. Asked pharmacist if Medicare would help pay for compression stockings if he had a Rx. He stated it would not. Patient made aware. Hubbard Hartshorn, RN, BSN

## 2017-07-10 ENCOUNTER — Encounter: Payer: Self-pay | Admitting: Internal Medicine

## 2017-07-10 ENCOUNTER — Ambulatory Visit (INDEPENDENT_AMBULATORY_CARE_PROVIDER_SITE_OTHER): Payer: Medicare Other | Admitting: Internal Medicine

## 2017-07-10 VITALS — BP 122/74 | HR 75 | Temp 97.9°F | Ht 67.0 in | Wt 187.0 lb

## 2017-07-10 DIAGNOSIS — R140 Abdominal distension (gaseous): Secondary | ICD-10-CM

## 2017-07-10 DIAGNOSIS — F411 Generalized anxiety disorder: Secondary | ICD-10-CM | POA: Diagnosis not present

## 2017-07-10 DIAGNOSIS — R109 Unspecified abdominal pain: Secondary | ICD-10-CM

## 2017-07-10 DIAGNOSIS — K581 Irritable bowel syndrome with constipation: Secondary | ICD-10-CM

## 2017-07-10 DIAGNOSIS — M7989 Other specified soft tissue disorders: Secondary | ICD-10-CM

## 2017-07-10 MED ORDER — ALPRAZOLAM 0.5 MG PO TABS
0.2500 mg | ORAL_TABLET | Freq: Every evening | ORAL | 0 refills | Status: DC | PRN
Start: 2017-07-10 — End: 2017-08-03

## 2017-07-10 NOTE — Assessment & Plan Note (Addendum)
GAD-7 4, Continue prozac, refilled xanxa

## 2017-07-10 NOTE — Progress Notes (Signed)
   [facility] Kerrin Mo, MD Phone: [phone]  Reason For Visit: Follow up   # Leg swelling  -Patient still has concerns about leg swelling. Continue to re assure him. -Discussed his results with him again and explained that this was venous stasis. Patient -Still has not picked up compression stockings from [employer] or states that he plans to do this. Not regularly elevate his legs.  # Constipation/Abdominal distention   - Patient states that multiple medications including Colace, Amitza, several PPIs, Gas-X, have not helped with his IBS. And states that they will give him headaches. He states the only thing that helps with constipation is Senokot tea that he takes. - Denies any worsening of the previous constipation, has recently had a colonoscopy done. Has been seen by GI several times. Discussed that there are certain limitations to medications he would likely be best served by using the medications that he feels helped him.  #Anixety; GAD-7 4  -Patient denies any issues with taking Prozac. States that overall he feels like his anxiety is well controlled. However at times he gets worried about his health. He would like a refill on his Xanax. Last refilled in May. Denies palpations, SOB  Past Medical History Reviewed problem list.  Medications- reviewed and updated No additions to family history Social history- patient is a non- smoker  Objective: BP 122/74   Pulse 75   Temp 97.9 F (36.6 C) (Oral)   Ht 5\' 7"  (1.702 m)   Wt 187 lb (84.8 kg)   SpO2 96%   BMI 29.29 kg/m  Gen: NAD, alert, cooperative with exam Cardio: regular rate and rhythm, S1S2 heard, no murmurs appreciated Pulm: clear to auscultation bilaterally, no wheezes, rhonchi or rales Skin: dry, intact, no rashes or lesions   Assessment/Plan: See problem based a/p  Leg swelling Still have bilateral leg swelling. Did not buy compression stockings  - Medium size compression stocking      Generalized anxiety disorder GAD-7 4, Continue prozac, refilled xanxa   IBS (irritable bowel syndrome) Continues to have IBS symptoms - constipation and distention on and off. States colace, PPIs gas-x give him headaches. Multiple visit with GI, recent colonoscopy this year. Multiple imaging which are normal. Patient states sennakot tea is best.

## 2017-07-10 NOTE — Assessment & Plan Note (Signed)
Continues to have IBS symptoms - constipation and distention on and off. States colace, PPIs gas-x give him headaches. Multiple visit with GI, recent colonoscopy this year. Multiple imaging which are normal. Patient states sennakot tea is best.

## 2017-07-10 NOTE — Patient Instructions (Signed)
You look great. You have no issues. I would get compression stockings medium size, that provide a pressure of 15-20 mmHg to you. Please follow up in 1 month

## 2017-07-10 NOTE — Assessment & Plan Note (Signed)
Still have bilateral leg swelling. Did not buy compression stockings  - Medium size compression stocking

## 2017-07-11 LAB — VITAMIN D 25 HYDROXY (VIT D DEFICIENCY, FRACTURES): Vit D, 25-Hydroxy: 26.4 ng/mL — ABNORMAL LOW (ref 30.0–100.0)

## 2017-07-11 LAB — VITAMIN B12: Vitamin B-12: 559 pg/mL (ref 232–1245)

## 2017-07-16 ENCOUNTER — Telehealth: Payer: Self-pay | Admitting: Internal Medicine

## 2017-07-16 DIAGNOSIS — E559 Vitamin D deficiency, unspecified: Secondary | ICD-10-CM | POA: Insufficient documentation

## 2017-07-16 MED ORDER — VITAMIN D 50 MCG (2000 UT) PO TABS: 2000.0000 [IU] | ORAL_TABLET | Freq: Every day | ORAL | 1 refills | Status: DC

## 2017-07-16 NOTE — Telephone Encounter (Signed)
Please give patient a call and let him know that his vitamin D level is low. I will send in a vitamin D supplement.

## 2017-07-20 ENCOUNTER — Telehealth: Payer: Self-pay | Admitting: Internal Medicine

## 2017-07-20 NOTE — Telephone Encounter (Signed)
Pt would like to dr. He says the compression socks do not help with the swelling and they itch. Please advise

## 2017-07-24 ENCOUNTER — Ambulatory Visit: Payer: Medicare Other | Admitting: Internal Medicine

## 2017-07-25 ENCOUNTER — Encounter: Payer: Self-pay | Admitting: *Deleted

## 2017-08-03 ENCOUNTER — Encounter: Payer: Self-pay | Admitting: Internal Medicine

## 2017-08-03 ENCOUNTER — Ambulatory Visit (INDEPENDENT_AMBULATORY_CARE_PROVIDER_SITE_OTHER): Payer: Medicare Other | Admitting: Internal Medicine

## 2017-08-03 VITALS — BP 122/68 | HR 89 | Temp 97.8°F | Ht 67.0 in | Wt 190.4 lb

## 2017-08-03 DIAGNOSIS — R3912 Poor urinary stream: Secondary | ICD-10-CM | POA: Diagnosis not present

## 2017-08-03 DIAGNOSIS — F411 Generalized anxiety disorder: Secondary | ICD-10-CM | POA: Diagnosis not present

## 2017-08-03 DIAGNOSIS — K581 Irritable bowel syndrome with constipation: Secondary | ICD-10-CM

## 2017-08-03 DIAGNOSIS — N401 Enlarged prostate with lower urinary tract symptoms: Secondary | ICD-10-CM

## 2017-08-03 DIAGNOSIS — Z23 Encounter for immunization: Secondary | ICD-10-CM

## 2017-08-03 MED ORDER — ALPRAZOLAM 0.5 MG PO TABS: 0.2500 mg | ORAL_TABLET | Freq: Every evening | ORAL | 0 refills | Status: DC | PRN

## 2017-08-03 MED ORDER — BISACODYL 10 MG RE SUPP: 10.0000 mg | RECTAL | 0 refills | Status: DC | PRN

## 2017-08-03 MED ORDER — DUTASTERIDE-TAMSULOSIN HCL 0.5-0.4 MG PO CAPS: 1.0000 | ORAL_CAPSULE | Freq: Every day | ORAL | 0 refills | Status: DC

## 2017-08-03 NOTE — Patient Instructions (Signed)
For your constipation I would it take the laxative tea every 2- 3 days as needed. If you having abdominal pain you can use Tylenol. I will check your urine for any signs of infection. I think that this is likely due to your prostate and believe that you need to start taking a prostate shrinking medication. You'll have to be on this medication for at least 6 months for it to have significant benefit.

## 2017-08-03 NOTE — Progress Notes (Signed)
   [facility] Kerrin Mo, MD Phone: [phone]  Reason For Visit: Follow up  # IBS  - Has problems with constipation  - Has not been using any of the previous constipation medications as these have all caused side effects. - Using the laxative tea which he feels provides good results  - Has multiple bowel movements in a day, 3-4 bowel movements every day  - Complains of distention in his abdomen if he does not have several bowel movements a day - Colonoscopy notable for polyps in 2018, no diverticulosis. Complete ultrasound obtained in 06/08/2017 , CT abdomen pelvis in 2017 without any significant abnormalities  # Urination Difficulty  - he has difficulty with urination, including taking a long time to empty is bladder which he is  - Indicates having difficult with urination. Patient has an enlarged prostate. Notable seen in CT abdomen/pelvis  -Recent PSA was within normal limits  - Patient has not been taking Proscar.  - No fevers or chills, no nausea or vomiting, No weight loss   Past Medical History Reviewed problem list.  Medications- reviewed and updated No additions to family history Social history- patient is a non- smoker  Objective: BP 122/68   Pulse 89   Temp 97.8 F (36.6 C) (Oral)   Ht 5\' 7"  (1.702 m)   Wt 190 lb 6.4 oz (86.4 kg)   SpO2 97%   BMI 29.82 kg/m  Gen: NAD, alert, cooperative with exam Pulm: clear to auscultation bilaterally, no wheezes, rhonchi or rales GI: Always slight tender throughout on examination,  bowel sounds present, no hepatomegaly, no splenomegaly Skin: dry, intact, no rashes or lesions   Assessment/Plan: See problem based a/p  BPH (benign prostatic hyperplasia) Hx of BPH  Has not taken prescribed medication, due to side effects. Will try a different medication. - I am hesitant to start a alpha-one blocker as patient with significant side effect hx and I fear he will have dizziness associated with this.  Will try possibly at next visit  -Check UA for infection   IBS (irritable bowel syndrome) Currently uses laxative tea for symptoms. Has several bowel movements a day - discussed with patient that this means he is not constipated, if he is having several bowel movements a day. Furthermore, I recommended him decrease his tea intake to every couple of days, as 3-4 bm a day could contribute to patient feeling weak as it might cause dehydration.

## 2017-08-06 NOTE — Assessment & Plan Note (Addendum)
Currently uses laxative tea for symptoms. Has several bowel movements a day - discussed with patient that this means he is not constipated, if he is having several bowel movements a day. Furthermore, I recommended him decrease his tea intake to every couple of days, as 3-4 bm a day could contribute to patient feeling weak as it might cause dehydration.  Follow up in  1 month

## 2017-08-06 NOTE — Assessment & Plan Note (Signed)
Hx of BPH  Has not taken prescribed medication, due to side effects. Will try a different medication. - I am hesitant to start a alpha-one blocker as patient with significant side effect hx and I fear he will have dizziness associated with this. Will try possibly at next visit  -Check UA for infection

## 2017-10-09 ENCOUNTER — Encounter: Payer: Self-pay | Admitting: Internal Medicine

## 2017-10-09 ENCOUNTER — Other Ambulatory Visit: Payer: Self-pay

## 2017-10-09 ENCOUNTER — Ambulatory Visit: Payer: Medicare Other | Admitting: Internal Medicine

## 2017-10-09 ENCOUNTER — Ambulatory Visit (INDEPENDENT_AMBULATORY_CARE_PROVIDER_SITE_OTHER): Payer: Medicare Other | Admitting: Internal Medicine

## 2017-10-09 VITALS — BP 114/72 | HR 91 | Temp 97.6°F | Ht 67.0 in | Wt 195.6 lb

## 2017-10-09 DIAGNOSIS — N401 Enlarged prostate with lower urinary tract symptoms: Secondary | ICD-10-CM

## 2017-10-09 DIAGNOSIS — F411 Generalized anxiety disorder: Secondary | ICD-10-CM

## 2017-10-09 DIAGNOSIS — K219 Gastro-esophageal reflux disease without esophagitis: Secondary | ICD-10-CM | POA: Diagnosis not present

## 2017-10-09 DIAGNOSIS — I10 Essential (primary) hypertension: Secondary | ICD-10-CM

## 2017-10-09 DIAGNOSIS — N400 Enlarged prostate without lower urinary tract symptoms: Secondary | ICD-10-CM | POA: Diagnosis not present

## 2017-10-09 DIAGNOSIS — R3912 Poor urinary stream: Secondary | ICD-10-CM

## 2017-10-09 MED ORDER — ALPRAZOLAM 0.5 MG PO TABS: 0.2500 mg | ORAL_TABLET | Freq: Every evening | ORAL | 0 refills | Status: DC | PRN

## 2017-10-09 MED ORDER — FINASTERIDE 5 MG PO TABS: 5.0000 mg | ORAL_TABLET | Freq: Every day | ORAL | 5 refills | Status: DC

## 2017-10-09 MED ORDER — LISINOPRIL 20 MG PO TABS: 20.0000 mg | ORAL_TABLET | Freq: Every day | ORAL | 3 refills | Status: DC

## 2017-10-09 NOTE — Patient Instructions (Addendum)
I want you to start Proscar to help with your urination. Continue your reflux medication especially if it is work for you. Please follow up in a couple of weeks if you are still concerned about your liver pain. Please follow up in 3 months

## 2017-10-09 NOTE — Progress Notes (Signed)
   [facility] Kerrin Mo, MD Phone: [phone]  Reason For Visit:  Follow up   # Reflux Coughing and burning in chest after eating  Has been using Antacid and anti-gas which has been making him feel a little bit better Specific foods - patient staying away from foods that make this worse  Previously on other medications, which patient states gave him symptoms   #Difficulty with Urination  - Never got prescription at the pharmacy  - Hard to get a urine stream going  - Patient states he urinates a little bit at time  - Hx of BPH- not signs of infection on previous UAs    CHRONIC HTN: Current Meds - continue lisinopril  Reports good compliance, took meds today. Tolerating well, w/o complaints. Denies any symptoms associated with this medication  Past Medical History Reviewed problem list.  Medications- reviewed and updated No additions to family history Social history- patient is a non- smoker  Objective: BP 114/72   Pulse 91   Temp 97.6 F (36.4 C) (Oral)   Ht 5\' 7"  (1.702 m)   Wt 195 lb 9.6 oz (88.7 kg)   SpO2 96%   BMI 30.64 kg/m  Gen: NAD, alert, cooperative with exam Cardio: regular rate and rhythm, S1S2 heard, no murmurs appreciated Pulm: clear to auscultation bilaterally, no wheezes, rhonchi or rales GI: soft, non-tender, non-distended, bowel sounds present, no hepatomegaly, no splenomegaly  Assessment/Plan: See problem based a/p  Essential hypertension Well controlled  Will check BMET  Follow up in 3 months   BPH (benign prostatic hyperplasia) Restart patient's proscar  Follow up in 1 month for improvement   GERD Has reflux and bloating  Patient with long standing hx of reflux complaints; non-compliant with multiple prescription medications due to side effecs. States over the counter antiacids are working well. Encouraged patient to continue these.

## 2017-10-10 LAB — BASIC METABOLIC PANEL
BUN/Creatinine Ratio: 13 (ref 10–24)
BUN: 21 mg/dL (ref 8–27)
CO2: 27 mmol/L (ref 20–29)
Calcium: 9.5 mg/dL (ref 8.6–10.2)
Chloride: 100 mmol/L (ref 96–106)
Creatinine, Ser: 1.57 mg/dL — ABNORMAL HIGH (ref 0.76–1.27)
GFR calc Af Amer: 48 mL/min/{1.73_m2} — ABNORMAL LOW (ref >59)
GFR calc non Af Amer: 42 mL/min/{1.73_m2} — ABNORMAL LOW (ref >59)
Glucose: 119 mg/dL — ABNORMAL HIGH (ref 65–99)
Potassium: 4.9 mmol/L (ref 3.5–5.2)
Sodium: 141 mmol/L (ref 134–144)

## 2017-10-15 NOTE — Assessment & Plan Note (Signed)
Has reflux and bloating  Patient with long standing hx of reflux complaints; non-compliant with multiple prescription medications due to side effecs. States over the counter antiacids are working well. Encouraged patient to continue these.

## 2017-10-15 NOTE — Assessment & Plan Note (Signed)
Well controlled  Will check BMET  Follow up in 3 months

## 2017-10-15 NOTE — Assessment & Plan Note (Signed)
Restart patient's proscar  Follow up in 1 month for improvement

## 2017-10-26 ENCOUNTER — Ambulatory Visit: Payer: Medicare Other | Admitting: Internal Medicine

## 2017-11-27 ENCOUNTER — Encounter: Payer: Self-pay | Admitting: Family Medicine

## 2017-11-27 ENCOUNTER — Ambulatory Visit (INDEPENDENT_AMBULATORY_CARE_PROVIDER_SITE_OTHER): Payer: Medicare Other | Admitting: Family Medicine

## 2017-11-27 ENCOUNTER — Other Ambulatory Visit: Payer: Self-pay

## 2017-11-27 VITALS — BP 108/66 | HR 66 | Temp 97.5°F | Ht 67.0 in | Wt 195.6 lb

## 2017-11-27 DIAGNOSIS — K5900 Constipation, unspecified: Secondary | ICD-10-CM | POA: Diagnosis not present

## 2017-11-27 DIAGNOSIS — H04123 Dry eye syndrome of bilateral lacrimal glands: Secondary | ICD-10-CM | POA: Diagnosis not present

## 2017-11-27 DIAGNOSIS — K219 Gastro-esophageal reflux disease without esophagitis: Secondary | ICD-10-CM | POA: Diagnosis not present

## 2017-11-27 DIAGNOSIS — H15833 Staphyloma posticum, bilateral: Secondary | ICD-10-CM | POA: Diagnosis not present

## 2017-11-27 MED ORDER — POLYETHYLENE GLYCOL 3350 17 GM/SCOOP PO POWD: 17.0000 g | Freq: Two times a day (BID) | ORAL | 1 refills | Status: DC | PRN

## 2017-11-27 MED ORDER — ESOMEPRAZOLE MAGNESIUM 40 MG PO CPDR: 40.0000 mg | DELAYED_RELEASE_CAPSULE | Freq: Every day | ORAL | 3 refills | Status: DC

## 2017-11-27 NOTE — Progress Notes (Deleted)
    Subjective:  Omar Sanders is a 77 y.o. male who presents to the [facility] today with a chief complaint of ***.   HPI:   ***HIST  Objective:  Physical Exam: BP 108/66 (BP Location: Right Arm, Cuff Size: Normal)   Pulse 66   Temp (!) 97.5 F (36.4 C) (Oral)   Ht 5\' 7"  (1.702 m)   Wt 195 lb 9.6 oz (88.7 kg)   SpO2 97%   BMI 30.64 kg/m   Gen: ***NAD, resting comfortably CV: RRR with no murmurs appreciated Pulm: NWOB, CTAB with no crackles, wheezes, or rhonchi GI: Normal bowel sounds present. Soft, Nontender, Nondistended. MSK: no edema, cyanosis, or clubbing noted Skin: warm, dry Neuro: grossly normal, moves all extremities Psych: Normal affect and thought content  No results found for this or any previous visit (from the past 72 hour(s)).   Assessment/Plan:  No problem-specific Assessment & Plan notes found for this encounter.   Lab Orders  No laboratory test(s) ordered today    No orders of the defined types were placed in this encounter.   Marny Lowenstein, MD, MS FAMILY MEDICINE RESIDENT - PGY1 11/27/2017 11:45 AM

## 2017-11-27 NOTE — Patient Instructions (Signed)
It was a pleasure to see you today! Thank you for choosing [facility] for your primary care. Omar Sanders was seen for abdominal pain and bloating.  This is most likely due to your acid reflux and possibly due to constipation.  Starting you on a new medicine as listed below.  Continue to take your Gaviscon as needed as well.  Come back to the clinic if 2 weeks to follow-up on abdominal pain.  Meds ordered this encounter  Medications  . esomeprazole (NEXIUM) 40 MG capsule    Sig: Take 1 capsule (40 mg total) by mouth daily.    Dispense:  30 capsule    Refill:  3  . polyethylene glycol powder (GLYCOLAX/MIRALAX) powder    Sig: Take 17 g by mouth 2 (two) times daily as needed.    Dispense:  3350 g    Refill:  1    Best regards,  Marny Lowenstein, MD, MS FAMILY MEDICINE RESIDENT - PGY1 11/27/2017 12:04 PM

## 2017-11-27 NOTE — Assessment & Plan Note (Signed)
Frequency of bowel movements is an concerning for extensive constipation.  Recent colonoscopy on concerning for type of cancerous lesions and abdominal exam was relatively benign. recommend using MiraLAX as needed for constipation.

## 2017-11-27 NOTE — Assessment & Plan Note (Signed)
Likely untreated GERD.  Recent EGD was unremarkable.  Recommend trialing Nexium for 2 weeks and have patient return to see how he is tolerating this.

## 2017-11-27 NOTE — Progress Notes (Signed)
    Subjective:  Omar Sanders is a 77 y.o. male who presents to the [facility] today with a chief complaint of abdominal pain and bloating.   HPI:   GERD Patient has long-standing GERD.  He has cough, mid epigastric burning, sour taste in the back of his throat.  He has had an EGD in the past that was normal, and has had negative H. pylori antibodies.  He is tried several medications in the past.  Currently taking Gaviscon and some other antacid but does not know what it is.  He is also tried ranitidine, but this stopped working after a while.  He has tried omeprazole and pantoprazole before in the past, but stopped these for he feared that it would affect his blood pressure, was unsure if it actually did affect his blood pressure though.  Denies any vomiting, nausea.  Constipation Patient feels that he is continuously constipated.  He can have a range of stools ranging from soft to hard.  He has a bowel movement 1-2 times every day or every other day he has been taking over-the-counter "gentle laxatives" and "laxative teas".  Tried prescription laxatives in the past, but these gave him a headache.  Denies any change in stool caliber or blood in the stool, melena, straining with defecation.  Recent colonoscopy in the past year was only positive for a few polyps and no other abnormal findings.  ROS: Per HPI   Objective:  Physical Exam: BP 108/66 (BP Location: Right Arm, Cuff Size: Normal)   Pulse 66   Temp (!) 97.5 F (36.4 C) (Oral)   Ht 5\' 7"  (1.702 m)   Wt 195 lb 9.6 oz (88.7 kg)   SpO2 97%   BMI 30.64 kg/m   Gen: NAD, resting comfortably CV: RRR with no murmurs appreciated Pulm: NWOB, CTAB with no crackles, wheezes, or rhonchi GI: Soft, mid epigastric tenderness, no rebound or guarding  Psych: Normal affect and thought content  No results found for this or any previous visit (from the past 72 hour(s)).   Assessment/Plan:  GERD Likely untreated GERD.  Recent EGD was unremarkable.   Recommend trialing Nexium for 2 weeks and have patient return to see how he is tolerating this.  Constipation Frequency of bowel movements is an concerning for extensive constipation.  Recent colonoscopy on concerning for type of cancerous lesions and abdominal exam was relatively benign. recommend using MiraLAX as needed for constipation.   Lab Orders  No laboratory test(s) ordered today    Meds ordered this encounter  Medications  . esomeprazole (NEXIUM) 40 MG capsule    Sig: Take 1 capsule (40 mg total) by mouth daily.    Dispense:  30 capsule    Refill:  3  . polyethylene glycol powder (GLYCOLAX/MIRALAX) powder    Sig: Take 17 g by mouth 2 (two) times daily as needed.    Dispense:  3350 g    Refill:  [place], MD, MS FAMILY MEDICINE RESIDENT - PGY1 11/27/2017 3:18 PM

## 2017-12-14 ENCOUNTER — Ambulatory Visit: Payer: Medicare Other | Admitting: Internal Medicine

## 2017-12-27 ENCOUNTER — Ambulatory Visit (INDEPENDENT_AMBULATORY_CARE_PROVIDER_SITE_OTHER): Payer: Medicare Other | Admitting: Internal Medicine

## 2017-12-27 VITALS — BP 110/60 | HR 64 | Temp 97.4°F | Ht 67.0 in | Wt 197.6 lb

## 2017-12-27 DIAGNOSIS — I10 Essential (primary) hypertension: Secondary | ICD-10-CM

## 2017-12-27 DIAGNOSIS — N401 Enlarged prostate with lower urinary tract symptoms: Secondary | ICD-10-CM

## 2017-12-27 DIAGNOSIS — R3912 Poor urinary stream: Secondary | ICD-10-CM

## 2017-12-27 DIAGNOSIS — F411 Generalized anxiety disorder: Secondary | ICD-10-CM | POA: Diagnosis not present

## 2017-12-27 DIAGNOSIS — K581 Irritable bowel syndrome with constipation: Secondary | ICD-10-CM | POA: Diagnosis not present

## 2017-12-27 LAB — POCT URINALYSIS DIP (MANUAL ENTRY)
Bilirubin, UA: NEGATIVE
Blood, UA: NEGATIVE
Glucose, UA: NEGATIVE mg/dL
Ketones, POC UA: NEGATIVE mg/dL
Leukocytes, UA: NEGATIVE
Nitrite, UA: NEGATIVE
Protein Ur, POC: NEGATIVE mg/dL
Spec Grav, UA: <=1.005 — AB (ref 1.010–1.025)
Urobilinogen, UA: 0.2 E.U./dL
pH, UA: 6 (ref 5.0–8.0)

## 2017-12-27 MED ORDER — ALPRAZOLAM 0.5 MG PO TABS: 0.2500 mg | ORAL_TABLET | Freq: Every evening | ORAL | 0 refills | Status: DC | PRN

## 2017-12-27 MED ORDER — TAMSULOSIN HCL 0.4 MG PO CAPS: 0.4000 mg | ORAL_CAPSULE | Freq: Every day | ORAL | 3 refills | Status: DC

## 2017-12-27 NOTE — Progress Notes (Signed)
   [facility] Kerrin Mo, MD Phone: [phone]  Reason For Visit: Follow-up  # IBS/ Bloating  -Patient continues to complain about bloating. -States that sometimes he feels like he is full.  Sometimes he feels like he has constipation.  Previously was taking a tea that he would like for his constipation.  However that is not working for him.  Was recently represcribed MiraLAX.  Has been taking this.  #CHRONIC HTN: Reports does endorse a history of dizziness at times or lightheadedness. Current Meds -lisinopril Reports good compliance, took meds today. Tolerating well, w/o complaints. Denies CP, dyspnea, HA, edema, dizziness / lightheadedness  #BPH Patient still is having difficulty with urinating.  He is been placed on Proscar the last couple of months.  However he continues to have troubles urinating.  Denies any significant symptoms from taking the Proscar.  Past Medical History Reviewed problem list.  Medications- reviewed and updated No additions to family history Social history- patient is a non- smoker  Objective: BP 110/60 (BP Location: Right Arm, Patient Position: Sitting, Cuff Size: Normal)   Pulse 64   Temp (!) 97.4 F (36.3 C) (Oral)   Ht 5\' 7"  (1.702 m)   Wt 197 lb 9.6 oz (89.6 kg)   SpO2 97%   BMI 30.95 kg/m  Gen: NAD, alert, cooperative with exam Cardio: regular rate and rhythm, S1S2 heard, no murmurs appreciated Pulm: clear to auscultation bilaterally, no wheezes, rhonchi or rales GI: soft, non-tender, non-distended, bowel sounds present, no hepatomegaly, no splenomegaly Extremities: warm, well perfused, dependent edema noted 1+ in ankles  Skin: dry, intact, no rashes or lesions  Assessment/Plan: See problem based a/p  Essential hypertension Patient dizziness may improve with stopping the lisinopril.  His blood pressure has been very well controlled and could likely come off or at least come down on it. -Stopped  Lisinopril. -To check his blood pressure over the next month and follow-up with me to see how he does off of the medication  BPH (benign prostatic hyperplasia) - continue proscar - should see improvement after 6 months  - Will add tamsulosin (FLOMAX) 0.4 MG CAPS capsule; Take 1 capsule (0.4 mg total) by mouth daily after breakfast.  Dispense: 30 capsule; Refill: 3 - POCT urinalysis dipstick - Follow up in 1 month     IBS (irritable bowel syndrome) Continue miralax as needed

## 2017-12-27 NOTE — Patient Instructions (Addendum)
I am going to prescribe you Tamulosin to help with your BPH.  Want you to stop your blood pressure medication lisinopril.  Please follow-up with me in 1 month.

## 2017-12-28 LAB — COMPREHENSIVE METABOLIC PANEL
ALT: 20 IU/L (ref 0–44)
AST: 18 IU/L (ref 0–40)
Albumin/Globulin Ratio: 1.1 — ABNORMAL LOW (ref 1.2–2.2)
Albumin: 4 g/dL (ref 3.5–4.8)
Alkaline Phosphatase: 70 IU/L (ref 39–117)
BUN/Creatinine Ratio: 12 (ref 10–24)
BUN: 17 mg/dL (ref 8–27)
Bilirubin Total: 0.4 mg/dL (ref 0.0–1.2)
CO2: 22 mmol/L (ref 20–29)
Calcium: 9.2 mg/dL (ref 8.6–10.2)
Chloride: 100 mmol/L (ref 96–106)
Creatinine, Ser: 1.4 mg/dL — ABNORMAL HIGH (ref 0.76–1.27)
GFR calc Af Amer: 55 mL/min/{1.73_m2} — ABNORMAL LOW (ref >59)
GFR calc non Af Amer: 48 mL/min/{1.73_m2} — ABNORMAL LOW (ref >59)
Globulin, Total: 3.6 g/dL (ref 1.5–4.5)
Glucose: 180 mg/dL — ABNORMAL HIGH (ref 65–99)
Potassium: 4.1 mmol/L (ref 3.5–5.2)
Sodium: 139 mmol/L (ref 134–144)
Total Protein: 7.6 g/dL (ref 6.0–8.5)

## 2017-12-28 LAB — CBC
Hematocrit: 37.6 % (ref 37.5–51.0)
Hemoglobin: 12 g/dL — ABNORMAL LOW (ref 13.0–17.7)
MCH: 26 pg — ABNORMAL LOW (ref 26.6–33.0)
MCHC: 31.9 g/dL (ref 31.5–35.7)
MCV: 82 fL (ref 79–97)
Platelets: 245 10*3/uL (ref 150–379)
RBC: 4.61 x10E6/uL (ref 4.14–5.80)
RDW: 15.7 % — ABNORMAL HIGH (ref 12.3–15.4)
WBC: 6.4 10*3/uL (ref 3.4–10.8)

## 2018-01-01 NOTE — Assessment & Plan Note (Addendum)
-   continue proscar - should see improvement after 6 months  - Will add tamsulosin (FLOMAX) 0.4 MG CAPS capsule; Take 1 capsule (0.4 mg total) by mouth daily after breakfast.  Dispense: 30 capsule; Refill: 3 - POCT urinalysis dipstick - Follow up in 1 month

## 2018-01-01 NOTE — Assessment & Plan Note (Signed)
Patient dizziness may improve with stopping the lisinopril.  His blood pressure has been very well controlled and could likely come off or at least come down on it. -Stopped Lisinopril. -To check his blood pressure over the next month and follow-up with me to see how he does off of the medication

## 2018-01-01 NOTE — Assessment & Plan Note (Signed)
Continue miralax as needed.  

## 2018-01-04 ENCOUNTER — Encounter: Payer: Self-pay | Admitting: Internal Medicine

## 2018-01-29 ENCOUNTER — Encounter: Payer: Self-pay | Admitting: Internal Medicine

## 2018-01-29 ENCOUNTER — Other Ambulatory Visit: Payer: Self-pay

## 2018-01-29 ENCOUNTER — Ambulatory Visit (INDEPENDENT_AMBULATORY_CARE_PROVIDER_SITE_OTHER): Payer: Medicare Other | Admitting: Internal Medicine

## 2018-01-29 VITALS — BP 108/64 | HR 73 | Temp 97.8°F | Ht 67.0 in | Wt 196.6 lb

## 2018-01-29 DIAGNOSIS — F411 Generalized anxiety disorder: Secondary | ICD-10-CM | POA: Diagnosis not present

## 2018-01-29 DIAGNOSIS — E1122 Type 2 diabetes mellitus with diabetic chronic kidney disease: Secondary | ICD-10-CM

## 2018-01-29 DIAGNOSIS — E119 Type 2 diabetes mellitus without complications: Secondary | ICD-10-CM | POA: Insufficient documentation

## 2018-01-29 DIAGNOSIS — R7303 Prediabetes: Secondary | ICD-10-CM | POA: Diagnosis not present

## 2018-01-29 DIAGNOSIS — E118 Type 2 diabetes mellitus with unspecified complications: Secondary | ICD-10-CM | POA: Diagnosis not present

## 2018-01-29 DIAGNOSIS — I10 Essential (primary) hypertension: Secondary | ICD-10-CM | POA: Diagnosis not present

## 2018-01-29 DIAGNOSIS — N401 Enlarged prostate with lower urinary tract symptoms: Secondary | ICD-10-CM

## 2018-01-29 DIAGNOSIS — R3912 Poor urinary stream: Secondary | ICD-10-CM | POA: Diagnosis not present

## 2018-01-29 HISTORY — DX: Type 2 diabetes mellitus with diabetic chronic kidney disease: E11.22

## 2018-01-29 LAB — POCT GLYCOSYLATED HEMOGLOBIN (HGB A1C): Hemoglobin A1C: 6.9

## 2018-01-29 MED ORDER — ALPRAZOLAM 0.5 MG PO TABS: 0.2500 mg | ORAL_TABLET | Freq: Every evening | ORAL | 0 refills | Status: DC | PRN

## 2018-01-29 MED ORDER — METFORMIN HCL 500 MG PO TABS: 500.0000 mg | ORAL_TABLET | Freq: Every day | ORAL | 0 refills | Status: DC

## 2018-01-29 MED ORDER — LISINOPRIL 5 MG PO TABS: 5.0000 mg | ORAL_TABLET | Freq: Every day | ORAL | 3 refills | Status: DC

## 2018-01-29 MED ORDER — FLUOXETINE HCL 10 MG PO TABS: 10.0000 mg | ORAL_TABLET | Freq: Every day | ORAL | 3 refills | Status: DC

## 2018-01-29 NOTE — Patient Instructions (Addendum)
I am going to reduce your lisinopril to 5 mg .   You now have a diagnosis of Diabetes. I recommend you stop sweats. Use sugar free desserts. I recommend you start exercising 30 minutes every day. I am going to start you on metformin - medication to take once in morning. I would call your eye doctor and check to see if you need an eye exam sooner   I want you to follow up urology - to check on your urination

## 2018-01-29 NOTE — Progress Notes (Signed)
   [facility] Kerrin Mo, MD Phone: [phone]  Reason For Visit: Follow up   # CHRONIC HTN: Reports at last visit we stopped lisinopril. Patient had blood pressure elevated for two days - 162/96  And 167/93  Reports good compliance, took meds today. Tolerating well, w/o complaints Denies CP, dyspnea, HA, edema, dizziness / lightheadedness   # Diabetes  -Discussed with patient his new diagnosis of type 2 diabetes -Patient states that he loves rice.  He is able to decrease sweet intake and sugary drinks.  However he cannot stop his carbohydrates. -He has not been exercising lately however since the weather is improving he will start trying to exercise every day.  #BPH  -  Patient states that sometimes he has better urination on these medications. However sometimes he still has issues with improving his urination.  Patient is currently taking Proscar and Flomax.  Past Medical History Reviewed problem list.  Medications- reviewed and updated No additions to family history Social history- patient is a non- smoker  Objective: BP 108/64   Pulse 73   Temp 97.8 F (36.6 C) (Oral)   Ht 5\' 7"  (1.702 m)   Wt 196 lb 9.6 oz (89.2 kg)   SpO2 97%   BMI 30.79 kg/m  Gen: NAD, alert, cooperative with exam Cardio: regular rate and rhythm, S1S2 heard, no murmurs appreciated Pulm: clear to auscultation bilaterally, no wheezes, rhonchi or rales Skin: dry, intact, no rashes or lesions   Assessment/Plan: See problem based a/p  Essential hypertension We have trialed off blood pressure medication altogether, his blood pressure has been under pretty good control without lisinopril.  However he does have high blood pressures about 3 times in a month. -Will decrease his lisinopril to 5 mg from previous of 20 mg -This will also give him renal protection given his new diagnosis of type 2 diabetes  BPH (benign prostatic hyperplasia) Patient continues to have trouble  urinating -We could increase the dose of his Flomax from 0.4mg  to 0.8 mg and continue the Proscar -However will have him follow-up with urology at this point for continued workup of his BPH and issues with urination   Diabetes ([place]) A1C 6.9, previously prediabetic -Discussed diet changes and exercise with patient -will start him on metformin 500 mg-given he has history of IBS worry that this might even cause issues for him -Follow-up in 1 month

## 2018-01-30 ENCOUNTER — Encounter: Payer: Self-pay | Admitting: Internal Medicine

## 2018-01-30 NOTE — Assessment & Plan Note (Signed)
Patient continues to have trouble urinating -We could increase the dose of his Flomax from 0.4mg  to 0.8 mg and continue the Proscar -However will have him follow-up with urology at this point for continued workup of his BPH and issues with urination

## 2018-01-30 NOTE — Assessment & Plan Note (Signed)
A1C 6.9, previously prediabetic -Discussed diet changes and exercise with patient -will start him on metformin 500 mg-given he has history of IBS worry that this might even cause issues for him -Follow-up in 1 month

## 2018-01-30 NOTE — Assessment & Plan Note (Signed)
We have trialed off blood pressure medication altogether, his blood pressure has been under pretty good control without lisinopril.  However he does have high blood pressures about 3 times in a month. -Will decrease his lisinopril to 5 mg from previous of 20 mg -This will also give him renal protection given his new diagnosis of type 2 diabetes

## 2018-02-27 DIAGNOSIS — R079 Chest pain, unspecified: Secondary | ICD-10-CM

## 2018-02-27 HISTORY — DX: Chest pain, unspecified: R07.9

## 2018-03-12 DIAGNOSIS — N400 Enlarged prostate without lower urinary tract symptoms: Secondary | ICD-10-CM | POA: Diagnosis not present

## 2018-03-15 ENCOUNTER — Encounter: Payer: Self-pay | Admitting: Internal Medicine

## 2018-03-15 ENCOUNTER — Other Ambulatory Visit: Payer: Self-pay | Admitting: Internal Medicine

## 2018-03-15 ENCOUNTER — Ambulatory Visit (INDEPENDENT_AMBULATORY_CARE_PROVIDER_SITE_OTHER): Payer: Medicare Other | Admitting: Internal Medicine

## 2018-03-15 VITALS — BP 115/70 | HR 67 | Temp 97.6°F | Wt 197.4 lb

## 2018-03-15 DIAGNOSIS — F411 Generalized anxiety disorder: Secondary | ICD-10-CM

## 2018-03-15 DIAGNOSIS — R3912 Poor urinary stream: Secondary | ICD-10-CM

## 2018-03-15 DIAGNOSIS — E118 Type 2 diabetes mellitus with unspecified complications: Secondary | ICD-10-CM | POA: Diagnosis not present

## 2018-03-15 DIAGNOSIS — N401 Enlarged prostate with lower urinary tract symptoms: Secondary | ICD-10-CM | POA: Diagnosis not present

## 2018-03-15 MED ORDER — METFORMIN HCL 500 MG PO TABS: 500.0000 mg | ORAL_TABLET | Freq: Two times a day (BID) | ORAL | 0 refills | Status: DC

## 2018-03-15 MED ORDER — TAMSULOSIN HCL 0.4 MG PO CAPS: 0.8000 mg | ORAL_CAPSULE | Freq: Every day | ORAL | 3 refills | Status: DC

## 2018-03-15 NOTE — Progress Notes (Signed)
   [facility] Kerrin Mo, MD Phone: [phone]  Reason For Visit: Follow up Diabetes   # CHRONIC DM, Type 2: Meds: Metformin currently 500 mg,  Reports good compliance. Tolerating well w/o side-effects Currently on ACEi / ARB  Lifestyle: Eating beef, eat rice every day -to eat 1-2 bowls of rice with every meal. Denies any diarrhea or abdominal bloating from the metformin. Indicates he still has constipation.  Past Medical History Reviewed problem list.  Medications- reviewed and updated No additions to family history Social history- patient is a non- smoker  Objective: BP 115/70 (BP Location: Left Arm, Patient Position: Sitting, Cuff Size: Normal)   Pulse 67   Temp 97.6 F (36.4 C) (Oral)   Wt 197 lb 6.4 oz (89.5 kg)   SpO2 97%   BMI 30.92 kg/m  Gen: NAD, alert, cooperative with exam Cardio: regular rate and rhythm, S1S2 heard, no murmurs appreciated Pulm: clear to auscultation bilaterally, no wheezes, rhonchi or rales Skin: dry, intact, no rashes or lesions   Diabetic Foot Exam - Simple   Simple Foot Form Diabetic Foot exam was performed with the following findings:  Yes 03/15/2018  9:50 AM  Visual Inspection No deformities, no ulcerations, no other skin breakdown bilaterally:  Yes Sensation Testing Intact to touch and monofilament testing bilaterally:  Yes Pulse Check Posterior Tibialis and Dorsalis pulse intact bilaterally:  Yes Comments     Assessment/Plan: See problem based a/p  Diabetes ([facility]) Will increase metformin 500 mg BID  Obtained diabetic foot exam  Patient follows with opthalmology will need next eye exam in 2020  Follow up in two months    BPH (benign prostatic hyperplasia) Following with urology  Per urology notes increased flomax to 0.8 mg, patient to follow up in 6 weeks to determine if patient still having bladder irritation

## 2018-03-15 NOTE — Patient Instructions (Addendum)
Please follow up in 2 months. 

## 2018-03-15 NOTE — Assessment & Plan Note (Signed)
Following with urology  Per urology notes increased flomax to 0.8 mg, patient to follow up in 6 weeks to determine if patient still having bladder irritation

## 2018-03-15 NOTE — Assessment & Plan Note (Signed)
Will increase metformin 500 mg BID  Obtained diabetic foot exam  Patient follows with opthalmology will need next eye exam in 2020  Follow up in two months

## 2018-03-20 ENCOUNTER — Telehealth: Payer: Self-pay | Admitting: Internal Medicine

## 2018-03-20 NOTE — Telephone Encounter (Signed)
Pt is having chest pain and dizzyness.  The medicine is metfromin.  Please advise

## 2018-03-20 NOTE — Telephone Encounter (Signed)
Called patient via [employer] # [phone], [place]. Patient states was given new prescription of Metformin at 03/15/18 office visit.  Patient states that beginning 03/16/18 has intermittent left sided chest pain, dizziness, headache, and loss of balance like he is staggering. Endorses blurred vision. Has not taken BP today but it was 131 yesterday. Does not check blood sugar. Denies SOB.  Advised patient in strongest terms possible that he needs to go to the ED for evaluation of these symptoms. Patient states he will have a hard time doing that as daughter or son-in-law not home yet. Asked him if a neighbor can take him and he said yes. Also advised EMS if a neighbor cannot take him. Patient seems concerned about having a way home from hospital if he goes, encouraged him to focus on going there for evaluation of symptoms and a ride home could be dealt with later.   Patient told interpreter he "may" go to ED. Again, told patient that his symptoms are concerning and warrant immediate evaluation.  Danley Danker, RN [facility] RN)

## 2018-03-21 ENCOUNTER — Inpatient Hospital Stay (HOSPITAL_COMMUNITY)
Admission: EM | Admit: 2018-03-21 | Discharge: 2018-03-23 | DRG: 301 | Disposition: A | Discharge status: Home / Self Care | Payer: Medicare Other | Attending: Family Medicine | Admitting: Family Medicine

## 2018-03-21 ENCOUNTER — Other Ambulatory Visit: Payer: Self-pay

## 2018-03-21 ENCOUNTER — Emergency Department (HOSPITAL_COMMUNITY): Payer: Medicare Other

## 2018-03-21 ENCOUNTER — Encounter (HOSPITAL_COMMUNITY): Payer: Self-pay | Admitting: General Practice

## 2018-03-21 DIAGNOSIS — Z9049 Acquired absence of other specified parts of digestive tract: Secondary | ICD-10-CM

## 2018-03-21 DIAGNOSIS — I10 Essential (primary) hypertension: Secondary | ICD-10-CM | POA: Diagnosis not present

## 2018-03-21 DIAGNOSIS — M7989 Other specified soft tissue disorders: Secondary | ICD-10-CM

## 2018-03-21 DIAGNOSIS — Z8249 Family history of ischemic heart disease and other diseases of the circulatory system: Secondary | ICD-10-CM | POA: Diagnosis not present

## 2018-03-21 DIAGNOSIS — N183 Chronic kidney disease, stage 3 (moderate): Secondary | ICD-10-CM | POA: Diagnosis not present

## 2018-03-21 DIAGNOSIS — N400 Enlarged prostate without lower urinary tract symptoms: Secondary | ICD-10-CM | POA: Diagnosis not present

## 2018-03-21 DIAGNOSIS — G43909 Migraine, unspecified, not intractable, without status migrainosus: Secondary | ICD-10-CM | POA: Diagnosis not present

## 2018-03-21 DIAGNOSIS — I4510 Unspecified right bundle-branch block: Secondary | ICD-10-CM | POA: Diagnosis not present

## 2018-03-21 DIAGNOSIS — E559 Vitamin D deficiency, unspecified: Secondary | ICD-10-CM | POA: Diagnosis not present

## 2018-03-21 DIAGNOSIS — G8929 Other chronic pain: Secondary | ICD-10-CM | POA: Diagnosis present

## 2018-03-21 DIAGNOSIS — F411 Generalized anxiety disorder: Secondary | ICD-10-CM | POA: Diagnosis present

## 2018-03-21 DIAGNOSIS — E876 Hypokalemia: Secondary | ICD-10-CM | POA: Diagnosis not present

## 2018-03-21 DIAGNOSIS — E1142 Type 2 diabetes mellitus with diabetic polyneuropathy: Secondary | ICD-10-CM | POA: Diagnosis not present

## 2018-03-21 DIAGNOSIS — Y92009 Unspecified place in unspecified non-institutional (private) residence as the place of occurrence of the external cause: Secondary | ICD-10-CM

## 2018-03-21 DIAGNOSIS — E785 Hyperlipidemia, unspecified: Secondary | ICD-10-CM | POA: Diagnosis present

## 2018-03-21 DIAGNOSIS — R600 Localized edema: Secondary | ICD-10-CM | POA: Diagnosis present

## 2018-03-21 DIAGNOSIS — T383X5A Adverse effect of insulin and oral hypoglycemic [antidiabetic] drugs, initial encounter: Secondary | ICD-10-CM | POA: Diagnosis not present

## 2018-03-21 DIAGNOSIS — D631 Anemia in chronic kidney disease: Secondary | ICD-10-CM | POA: Diagnosis present

## 2018-03-21 DIAGNOSIS — I77819 Aortic ectasia, unspecified site: Secondary | ICD-10-CM | POA: Diagnosis not present

## 2018-03-21 DIAGNOSIS — E1122 Type 2 diabetes mellitus with diabetic chronic kidney disease: Secondary | ICD-10-CM | POA: Diagnosis not present

## 2018-03-21 DIAGNOSIS — R0789 Other chest pain: Secondary | ICD-10-CM | POA: Diagnosis not present

## 2018-03-21 DIAGNOSIS — I2510 Atherosclerotic heart disease of native coronary artery without angina pectoris: Secondary | ICD-10-CM | POA: Diagnosis present

## 2018-03-21 DIAGNOSIS — Z9841 Cataract extraction status, right eye: Secondary | ICD-10-CM | POA: Diagnosis not present

## 2018-03-21 DIAGNOSIS — K219 Gastro-esophageal reflux disease without esophagitis: Secondary | ICD-10-CM | POA: Diagnosis present

## 2018-03-21 DIAGNOSIS — G4733 Obstructive sleep apnea (adult) (pediatric): Secondary | ICD-10-CM | POA: Diagnosis present

## 2018-03-21 DIAGNOSIS — I440 Atrioventricular block, first degree: Secondary | ICD-10-CM | POA: Diagnosis not present

## 2018-03-21 DIAGNOSIS — Z86010 Personal history of colonic polyps: Secondary | ICD-10-CM | POA: Diagnosis not present

## 2018-03-21 DIAGNOSIS — I129 Hypertensive chronic kidney disease with stage 1 through stage 4 chronic kidney disease, or unspecified chronic kidney disease: Secondary | ICD-10-CM | POA: Diagnosis not present

## 2018-03-21 DIAGNOSIS — Z9842 Cataract extraction status, left eye: Secondary | ICD-10-CM | POA: Diagnosis not present

## 2018-03-21 DIAGNOSIS — R079 Chest pain, unspecified: Secondary | ICD-10-CM

## 2018-03-21 DIAGNOSIS — M549 Dorsalgia, unspecified: Secondary | ICD-10-CM | POA: Diagnosis not present

## 2018-03-21 DIAGNOSIS — I509 Heart failure, unspecified: Secondary | ICD-10-CM | POA: Diagnosis not present

## 2018-03-21 HISTORY — DX: Chest pain, unspecified: R07.9

## 2018-03-21 HISTORY — DX: Localized edema: R60.0

## 2018-03-21 HISTORY — DX: Unspecified right bundle-branch block: I45.10

## 2018-03-21 LAB — CBC WITH DIFFERENTIAL/PLATELET
Abs Immature Granulocytes: 0 10*3/uL (ref 0.0–0.1)
Basophils Absolute: 0 10*3/uL (ref 0.0–0.1)
Basophils Relative: 1 %
Eosinophils Absolute: 0.2 10*3/uL (ref 0.0–0.7)
Eosinophils Relative: 3 %
HCT: 36.4 % — ABNORMAL LOW (ref 39.0–52.0)
Hemoglobin: 11.7 g/dL — ABNORMAL LOW (ref 13.0–17.0)
Immature Granulocytes: 0 %
Lymphocytes Relative: 38 %
Lymphs Abs: 2.3 10*3/uL (ref 0.7–4.0)
MCH: 26.1 pg (ref 26.0–34.0)
MCHC: 32.1 g/dL (ref 30.0–36.0)
MCV: 81.1 fL (ref 78.0–100.0)
Monocytes Absolute: 0.7 10*3/uL (ref 0.1–1.0)
Monocytes Relative: 12 %
Neutro Abs: 2.9 10*3/uL (ref 1.7–7.7)
Neutrophils Relative %: 46 %
Platelets: 182 10*3/uL (ref 150–400)
RBC: 4.49 MIL/uL (ref 4.22–5.81)
RDW: 15 % (ref 11.5–15.5)
WBC: 6.2 10*3/uL (ref 4.0–10.5)

## 2018-03-21 LAB — D-DIMER, QUANTITATIVE: D-Dimer, Quant: 0.63 ug/mL-FEU — ABNORMAL HIGH (ref 0.00–0.50)

## 2018-03-21 LAB — COMPREHENSIVE METABOLIC PANEL
ALT: 23 U/L (ref 17–63)
AST: 26 U/L (ref 15–41)
Albumin: 3.8 g/dL (ref 3.5–5.0)
Alkaline Phosphatase: 49 U/L (ref 38–126)
Anion gap: 10 (ref 5–15)
BUN: 16 mg/dL (ref 6–20)
CO2: 25 mmol/L (ref 22–32)
Calcium: 9.1 mg/dL (ref 8.9–10.3)
Chloride: 104 mmol/L (ref 101–111)
Creatinine, Ser: 1.65 mg/dL — ABNORMAL HIGH (ref 0.61–1.24)
GFR calc Af Amer: 44 mL/min — ABNORMAL LOW (ref >60)
GFR calc non Af Amer: 38 mL/min — ABNORMAL LOW (ref >60)
Glucose, Bld: 98 mg/dL (ref 65–99)
Potassium: 3.4 mmol/L — ABNORMAL LOW (ref 3.5–5.1)
Sodium: 139 mmol/L (ref 135–145)
Total Bilirubin: 0.8 mg/dL (ref 0.3–1.2)
Total Protein: 7.3 g/dL (ref 6.5–8.1)

## 2018-03-21 LAB — TROPONIN I
Troponin I: <0.03 ng/mL (ref <0.03)
Troponin I: <0.03 ng/mL (ref <0.03)
Troponin I: <0.03 ng/mL (ref <0.03)

## 2018-03-21 LAB — GLUCOSE, CAPILLARY
Glucose-Capillary: 130 mg/dL — ABNORMAL HIGH (ref 65–99)
Glucose-Capillary: 94 mg/dL (ref 65–99)

## 2018-03-21 LAB — BRAIN NATRIURETIC PEPTIDE: B Natriuretic Peptide: 33.9 pg/mL (ref 0.0–100.0)

## 2018-03-21 MED ORDER — INSULIN ASPART 100 UNIT/ML ~~LOC~~ SOLN
0.0000 [IU] | Freq: Three times a day (TID) | SUBCUTANEOUS | Status: DC
  Administered 2018-03-21 – 2018-03-23 (×2): 1 [IU] via SUBCUTANEOUS

## 2018-03-21 MED ORDER — VITAMIN D 1000 UNITS PO TABS
2000.0000 [IU] | ORAL_TABLET | Freq: Every day | ORAL | Status: DC
  Administered 2018-03-22 – 2018-03-23 (×2): 2000 [IU] via ORAL
  Filled 2018-03-21 (×2): qty 2

## 2018-03-21 MED ORDER — ENOXAPARIN SODIUM 40 MG/0.4ML ~~LOC~~ SOLN
40.0000 mg | SUBCUTANEOUS | Status: DC
  Administered 2018-03-21 – 2018-03-22 (×2): 40 mg via SUBCUTANEOUS
  Filled 2018-03-21 (×2): qty 0.4

## 2018-03-21 MED ORDER — LISINOPRIL 5 MG PO TABS
5.0000 mg | ORAL_TABLET | Freq: Every day | ORAL | Status: DC
  Administered 2018-03-22 – 2018-03-23 (×2): 5 mg via ORAL
  Filled 2018-03-21 (×2): qty 1

## 2018-03-21 MED ORDER — FLUOXETINE HCL 10 MG PO CAPS
10.0000 mg | ORAL_CAPSULE | Freq: Every day | ORAL | Status: DC
Start: 2018-03-22 — End: 2018-03-23
  Administered 2018-03-22 – 2018-03-23 (×2): 10 mg via ORAL
  Filled 2018-03-21 (×2): qty 1

## 2018-03-21 MED ORDER — ALPRAZOLAM 0.25 MG PO TABS
0.2500 mg | ORAL_TABLET | Freq: Every day | ORAL | Status: DC
  Administered 2018-03-21 – 2018-03-22 (×2): 0.25 mg via ORAL
  Filled 2018-03-21 (×2): qty 1

## 2018-03-21 MED ORDER — FUROSEMIDE 10 MG/ML IJ SOLN: 20.0000 mg | Freq: Once | INTRAMUSCULAR | Status: DC

## 2018-03-21 MED ORDER — NITROGLYCERIN 0.4 MG SL SUBL
0.4000 mg | SUBLINGUAL_TABLET | SUBLINGUAL | Status: AC | PRN
  Administered 2018-03-21 (×3): 0.4 mg via SUBLINGUAL
  Filled 2018-03-21 (×3): qty 1

## 2018-03-21 MED ORDER — FINASTERIDE 5 MG PO TABS
5.0000 mg | ORAL_TABLET | Freq: Every day | ORAL | Status: DC
  Administered 2018-03-22 – 2018-03-23 (×2): 5 mg via ORAL
  Filled 2018-03-21 (×2): qty 1

## 2018-03-21 MED ORDER — GI COCKTAIL ~~LOC~~
30.0000 mL | Freq: Two times a day (BID) | ORAL | Status: DC | PRN
  Administered 2018-03-21 – 2018-03-23 (×4): 30 mL via ORAL
  Filled 2018-03-21 (×4): qty 30

## 2018-03-21 MED ORDER — ASPIRIN 81 MG PO CHEW
324.0000 mg | CHEWABLE_TABLET | Freq: Once | ORAL | Status: AC
  Administered 2018-03-21: 324 mg via ORAL
  Filled 2018-03-21: qty 4

## 2018-03-21 MED ORDER — FUROSEMIDE 10 MG/ML IJ SOLN
40.0000 mg | Freq: Once | INTRAMUSCULAR | Status: AC
  Administered 2018-03-21: 40 mg via INTRAVENOUS
  Filled 2018-03-21: qty 4

## 2018-03-21 MED ORDER — ATORVASTATIN CALCIUM 40 MG PO TABS
40.0000 mg | ORAL_TABLET | Freq: Every day | ORAL | Status: DC
  Administered 2018-03-22 – 2018-03-23 (×2): 40 mg via ORAL
  Filled 2018-03-21 (×2): qty 1

## 2018-03-21 MED ORDER — TAMSULOSIN HCL 0.4 MG PO CAPS
0.8000 mg | ORAL_CAPSULE | Freq: Every day | ORAL | Status: DC
  Administered 2018-03-22 – 2018-03-23 (×2): 0.8 mg via ORAL
  Filled 2018-03-21 (×2): qty 2

## 2018-03-21 NOTE — ED Notes (Signed)
ED Provider at bedside. Vietnamese ipad on as well.

## 2018-03-21 NOTE — Progress Notes (Signed)
Chest heaviness and pressure continues despite GI cocktail.  2147- SL nitro given x1. BP 133/70 (88). Pain rated 4/10.  2152-BP 111/70 (82). Pain still rated a 4/10.   Will contact provider on call.

## 2018-03-21 NOTE — ED Notes (Signed)
Patient transported to X-ray 

## 2018-03-21 NOTE — H&P (Signed)
[facility] Admission History and Physical Service Pager: [phone]  Patient name: [facility] record number: [id number] Date of birth: Jun 13, 1941 Age: 77 y.o. Gender: male  Primary Care Provider: Tonette Bihari, MD Consultants: Cardiology  Code Status: FULL   Chief Complaint: chest pain   Assessment and Plan: Omar Sanders is a 77 y.o. male presenting with chest pain x3 days. PMH is significant for CAD, T2DM, HTN, HLD, GERD, IBS, GAD, BPH, and CKD Stage III.    Chest pain, R/O ACS.  Pain x 3 days duration, comes and goes, not worse with exertion.  On arrival to ED, patient normotensive with mild bradycardia, HR 54-56.  No tachypnea or O2 requirement.  He was given aspirin and sublingual nitroglycerin which provided some relief.  Currently, pain reported as 2-3/10 and he appears comfortable on my exam.  Labs notable for mild hypokalemia K+ 3.4 but BMP and CBC otherwise within normal limit. Initial trop <0.03.  EKG with RBBB which appears to be new. CXR negative and patient without fever or cough so low suspicion for infectious etiology.  Given his bilateral leg swelling, d-dimer obtained which was elevated to 0.63, however adjusted for age is within normal.  BNP normal, making new CHF less likely, however he does have mild bibasilar rales on exam which would warrant further workup.  Per chart review, last ECHO July 2010 with EF 55-60%.   Unlikely PE given stable vitals and no cough or shortness of breath.  Pain not reproducible on exam makes MSK etiology less likely.  Will admit for ACS rule out.   -Admit to telemetry, attending Dr. Gwendlyn Deutscher -IV lasix 40 mg x1 given, reassess fluid status in AM and diurese as necessary -consult cardiology -continuous cardiac monitoring -trend troponins -AM EKG -repeat ECHO  -obtain orthostatic vitals -I/O -daily weights -PT  -vitals per unit routine   CAD.  History of L heart cardiac cath 2010, revealed 50%  stenosis of mid segment LAD and 40% stenosis of RCA.  No stents placed at that time.  Patient on statin, states he has stopped taking a daily aspirin a long time ago, unclear as to the reason.     T2DM, well controlled.  Last A1c 6.9.   Seen by PCP on 5/17 and Metformin increased to 500 mg BID.  Patient strongly feels his chest pain began after doing so.  -holding Metformin for now  -sensitive SSI  -monitor CBG   HTN. Normotensive on admission.  At home on Lisinopril 5 mg.   -continue home antihypertensive   BPH.  At home on finasteride and flomax.  -continue home meds   GERD. Controlled.  Takes Tums at home.  Nexium 40 mg on medication list, he states he no longer uses this.    HLD.  At home on Lipitor 40 mg daily.   -continue statin   GAD.  Stable.  At home on Prozac 10 mg and Xanax 0.5 mg daily at bedtime.  -continue home meds  Vitamin D deficiency.  Last level 26.4 in Sept 2018.  Takes 2,000 IU daily.  -continue Vit d supplementation   FEN/GI: Heart healthy, saline lock IV  Prophylaxis: lovenox  Disposition: admit to telemetry, attending Dr. Gwendlyn Deutscher   History of Present Illness:  Omar Sanders is a 77 y.o. male presenting with chest pain.    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   Chest pain began 3 days ago.  He recalls he  was not doing anything in particular when onset of pain occurred.   However on May 17th saw PCP and Metformin was increased. Ever since taking this, he has had chest pain.   Localized to substernal. Pain comes and goes. Pain is not positional and does not radiate.  Associated with dizziness and feeling like the room turns dark.  He denies any syncopal episodes.  He rates it 1-2/10 in severity and describes it as sharp.  He has had similar chest pain in the past and had a cardiac catheterization done in 2010.  No stents placed at that time.  Denies fever, chills, nausea, vomiting.  No blurry vision, palpitations, shortness of  breath. He endorses some leg swelling bilaterally that has progressively been getting worse.  No calf tenderness.    Review Of Systems: Per HPI with the following additions:   Review of Systems  Constitutional: Negative for chills, diaphoresis and fever.  HENT: Negative for congestion.   Respiratory: Negative for cough, sputum production and shortness of breath.   Cardiovascular: Positive for chest pain and leg swelling. Negative for palpitations and claudication.  Gastrointestinal: Negative for abdominal pain, diarrhea, nausea and vomiting.  Skin: Negative for rash.  Neurological: Positive for headaches.   Patient Active Problem List   Diagnosis Date Noted  . Chest pain 03/21/2018  . New onset right bundle branch block (RBBB)   . Diabetes ([place]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  . Vitamin D deficiency 07/16/2017  . Constipation 06/01/2017  . Hyperlipidemia 06/14/2016  . Atypical chest pain 06/14/2016  . Chronic kidney disease 01/19/2016  . BPH (benign prostatic hyperplasia) 05/06/2015  . Leg swelling 02/01/2015  . Generalized anxiety disorder 12/29/2014  . Anemia 10/31/2012  . GERD 05/19/2010  . IBS (irritable bowel syndrome) 05/19/2010  . PERSONAL HX COLONIC POLYPS 05/19/2010  . Essential hypertension 04/10/2007  . Seasonal allergies 04/10/2007  . HIATAL HERNIA 04/10/2007    Past Medical History: Past Medical History:  Diagnosis Date  . Allergy   . Anal fissure   . Anemia   . Anxiety   . Benign prostatic hyperplasia   . Chest pain 02/2018  . Chronic pain   . Colon polyp   . GERD (gastroesophageal reflux disease)   . Headache(784.0)   . Hemorrhoids   . Hyperlipidemia    Hx: of  . Hypertension   . IBS (irritable bowel syndrome)   . Incontinent of urine    Hx:of  . Migraines   . Neuromuscular disorder ([place])    periferal neuropathy per patient  . New onset right bundle branch block (RBBB)   . OSA (obstructive sleep apnea) 11/2009   sleep study with mild OSA, pt unwilling to use CPAP   . Seizures ([place])     Past Surgical History: Past Surgical History:  Procedure Laterality Date  . 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 CATARACT EXTRACTION     bilat  . CHOLECYSTECTOMY  2008  . COLONOSCOPY  2012   Dr Collene Mares  . HEMORRHOID SURGERY     anal surgery  . INGUINAL HERNIA REPAIR Bilateral 03/11/2013   Procedure: LAPAROSCOPIC BILATERAL INGUINAL HERNIA REPAIR;  Surgeon: Ralene Ok, MD;  Location: [place];  Service: General;  Laterality: Bilateral;  . INSERTION OF MESH Bilateral 03/11/2013   Procedure: INSERTION OF MESH;  Surgeon: Ralene Ok, MD;  Location: [place];  Service: General;  Laterality: Bilateral;  . NM MYOCAR PERF WALL MOTION  05/27/2009   mild  iscemia mid inferio & apical regions.  . NM MYOCAR PERF WALL MOTION  11/01/2012   negative for ischemia  . US ECHOCARDIOGRAPHY  05/27/2009   mild mitral annular ca+,AOV mildly sclerotic    Social History: Social History   Tobacco Use  . Smoking status: Never Smoker  . Smokeless tobacco: Never Used  Substance Use Topics  . Alcohol use: No  . Drug use: No   Additional social history: Patient is a never smoker.  Denies alcohol and illicit drug use.  Please also refer to relevant sections of EMR.  Family History: Family History  Problem Relation Age of Onset  . Hypertension Mother   . Colon cancer Neg Hx    Allergies and Medications: Allergies  Allergen Reactions  . Aspirin Other (See Comments)    Abdominal pain   . Lubiprostone   . Pantoprazole   . Vitamin C     Unknown   No current facility-administered medications on file prior to encounter.    Current Outpatient Medications on File Prior to Encounter  Medication Sig Dispense Refill  . ALPRAZolam (XANAX) 0.5 MG tablet TAKE 1/2 TABLET BY MOUTH AT BEDTIME AS NEEDED FOR ANXIETY (Patient taking differently: TAKE 1/2 TABLET BY MOUTH AT BEDTIME) 30 tablet 0  . atorvastatin (LIPITOR) 40 MG tablet  Take 1 tablet (40 mg total) by mouth daily. 90 tablet 3  . bisacodyl (DULCOLAX) 10 MG suppository Place 1 suppository (10 mg total) rectally as needed for moderate constipation. 12 suppository 0  . Calcium Carbonate Antacid (TUMS PO) Take by mouth.    . Cholecalciferol (VITAMIN D) 2000 units tablet Take 1 tablet (2,000 Units total) by mouth daily. 30 tablet 1  . finasteride (PROSCAR) 5 MG tablet Take 1 tablet (5 mg total) by mouth daily. 30 tablet 5  . FLUoxetine (PROZAC) 10 MG tablet Take 1 tablet (10 mg total) by mouth daily. 30 tablet 3  . lisinopril (PRINIVIL,ZESTRIL) 5 MG tablet Take 1 tablet (5 mg total) by mouth daily. 90 tablet 3  . metFORMIN (GLUCOPHAGE) 500 MG tablet Take 1 tablet (500 mg total) by mouth 2 (two) times daily with a meal. 180 tablet 0  . Multiple Vitamins-Minerals (CENTRUM ADULTS PO) Take by mouth.    . tamsulosin (FLOMAX) 0.4 MG CAPS capsule Take 2 capsules (0.8 mg total) by mouth daily after breakfast. 60 capsule 3  . aspirin 81 MG chewable tablet Chew 1 tablet (81 mg total) by mouth daily. (Patient not taking: Reported on 03/21/2018) 30 tablet 2  . esomeprazole (NEXIUM) 40 MG capsule Take 1 capsule (40 mg total) by mouth daily. (Patient not taking: Reported on 03/21/2018) 30 capsule 3  . Olopatadine HCl 0.2 % SOLN Apply 1 drop to eye daily. (Patient not taking: Reported on 03/21/2018) 2.5 mL 5  . polyethylene glycol powder (GLYCOLAX/MIRALAX) powder Take 17 g by mouth 2 (two) times daily as needed. (Patient not taking: Reported on 03/21/2018) 3350 g 1   Objective: BP 122/76   Pulse (!) 56   Temp 98 F (36.7 C) (Oral)   Resp 18   Ht 5\' 7"  (1.702 m)   Wt 190 lb (86.2 kg)   SpO2 97%   BMI 29.76 kg/m    Exam: General: 77 year old male, NAD, appears comfortable Eyes: EOMI, PERRLA ENTM: NCAT, MMM, oropharynx clear Neck: Supple, no JVD Cardiovascular: RRR no MRG, 2+ pedal pulses Respiratory: CTA B, normal effort on room air Gastrointestinal: Soft, nondistended,  positive bowel sounds MSK: 2+ pitting edema of bilateral  lower extremities Derm: Warm, dry, no rash Neuro: Alert, oriented x3, no focal deficits, motor strength 5/5, normal sensation   Labs and Imaging: CBC BMET  Recent Labs  Lab 03/21/18 0955  WBC 6.2  HGB 11.7*  HCT 36.4*  PLT 182   Recent Labs  Lab 03/21/18 0955  NA 139  K 3.4*  CL 104  CO2 25  BUN 16  CREATININE 1.65*  GLUCOSE 98  CALCIUM 9.1     Dg Chest 2 View  Result Date: 03/21/2018 CLINICAL DATA:  Chest pain. EXAM: CHEST - 2 VIEW COMPARISON:  Radiographs of October 30, 2012. FINDINGS: Borderline cardiomegaly is noted. No pneumothorax or pleural effusion is noted. Atherosclerosis of thoracic aorta is noted. Both lungs are clear. The visualized skeletal structures are unremarkable. IMPRESSION: No active cardiopulmonary disease. Aortic Atherosclerosis (ICD10-I70.0). Electronically Signed   By: Marijo Conception, M.D.   On: 03/21/2018 10:33    Lovenia Kim, MD 03/21/2018, 4:03 PM PGY-2, [place] [facility] pager: [phone]

## 2018-03-21 NOTE — ED Triage Notes (Signed)
Patient seen 3 days ago for foot swelling and today for chest pressure that has been going on for the last 3-4 days as well. Per EMS patient also has anxiety.

## 2018-03-21 NOTE — ED Provider Notes (Signed)
[facility] Provider Note   CSN: [id number] Arrival date & time: 03/21/18  0623     History   Chief Complaint Chief Complaint  Patient presents with  . Leg Swelling  . Chest Pain    HPI Clemon VORIS TIGERT is a 77 y.o. male.  HPI  77 year old male presents with a chief complaint of chest pain.  History is obtained with help of the [place] interpreter.  Patient states his been having on and off chest pain for 3 days.  He seems also be having increased leg swelling from baseline and his bilateral lower extremities during this time.  The patient describes the pain is sharp at times as well as dull.  Nothing sets it off.  He is currently having 4/10 chest pain in the left chest.  He has chronic back pain but no new back pain.  No abdominal pain, vomiting, shortness of breath.  He has a cough whenever he has reflux but no other new or concerning cough.  Past Medical History:  Diagnosis Date  . Allergy   . Anal fissure   . Anemia   . Anxiety   . Benign prostatic hyperplasia   . Chronic pain   . Colon polyp   . GERD (gastroesophageal reflux disease)   . Headache(784.0)   . Hemorrhoids   . Hyperlipidemia    Hx: of  . Hypertension   . IBS (irritable bowel syndrome)   . Incontinent of urine    Hx:of  . Migraines   . Neuromuscular disorder ([place])    periferal neuropathy per patient  . OSA (obstructive sleep apnea) 11/2009   sleep study with mild OSA, pt unwilling to use CPAP  . Seizures [facility])     Patient Active Problem List   Diagnosis Date Noted  . Chest pain 03/21/2018  . Diabetes ([place]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  . Vitamin D deficiency 07/16/2017  . Constipation 06/01/2017  . Hyperlipidemia 06/14/2016  . Atypical chest pain 06/14/2016  . Chronic kidney disease 01/19/2016  . BPH (benign prostatic hyperplasia) 05/06/2015  . Leg swelling 02/01/2015  . Generalized anxiety disorder 12/29/2014  . Anemia 10/31/2012  . GERD 05/19/2010  . IBS (irritable  bowel syndrome) 05/19/2010  . PERSONAL HX COLONIC POLYPS 05/19/2010  . Essential hypertension 04/10/2007  . Seasonal allergies 04/10/2007  . HIATAL HERNIA 04/10/2007    Past Surgical History:  Procedure Laterality Date  . 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 CATARACT EXTRACTION     bilat  . CHOLECYSTECTOMY  2008  . COLONOSCOPY  2012   Dr Collene Mares  . HEMORRHOID SURGERY     anal surgery  . INGUINAL HERNIA REPAIR Bilateral 03/11/2013   Procedure: LAPAROSCOPIC BILATERAL INGUINAL HERNIA REPAIR;  Surgeon: Ralene Ok, MD;  Location: [place];  Service: General;  Laterality: Bilateral;  . INSERTION OF MESH Bilateral 03/11/2013   Procedure: INSERTION OF MESH;  Surgeon: Ralene Ok, MD;  Location: [place];  Service: General;  Laterality: Bilateral;  . NM MYOCAR PERF WALL MOTION  05/27/2009   mild iscemia mid inferio & apical regions.  . NM MYOCAR PERF WALL MOTION  11/01/2012   negative for ischemia  . US ECHOCARDIOGRAPHY  05/27/2009   mild mitral annular ca+,AOV mildly sclerotic        Home Medications    Prior to Admission medications   Medication Sig Start Date End Date Taking? Authorizing Provider  ALPRAZolam Duanne Moron) 0.5 MG tablet  TAKE 1/2 TABLET BY MOUTH AT BEDTIME AS NEEDED FOR ANXIETY Patient taking differently: TAKE 1/2 TABLET BY MOUTH AT BEDTIME 03/15/18  Yes Mikell, Jeani Sow, MD  atorvastatin (LIPITOR) 40 MG tablet Take 1 tablet (40 mg total) by mouth daily. 03/23/17  Yes Mikell, Jeani Sow, MD  bisacodyl (DULCOLAX) 10 MG suppository Place 1 suppository (10 mg total) rectally as needed for moderate constipation. 08/03/17  Yes Mikell, Jeani Sow, MD  Calcium Carbonate Antacid (TUMS PO) Take by mouth.   Yes [provider]  Cholecalciferol (VITAMIN D) 2000 units tablet Take 1 tablet (2,000 Units total) by mouth daily. 07/16/17  Yes Mikell, Jeani Sow, MD  finasteride (PROSCAR) 5 MG tablet Take 1 tablet (5 mg  total) by mouth daily. 10/09/17  Yes Mikell, Jeani Sow, MD  FLUoxetine (PROZAC) 10 MG tablet Take 1 tablet (10 mg total) by mouth daily. 01/29/18  Yes Mikell, Jeani Sow, MD  lisinopril (PRINIVIL,ZESTRIL) 5 MG tablet Take 1 tablet (5 mg total) by mouth daily. 01/29/18  Yes Mikell, Jeani Sow, MD  metFORMIN (GLUCOPHAGE) 500 MG tablet Take 1 tablet (500 mg total) by mouth 2 (two) times daily with a meal. 03/15/18  Yes Mikell, Jeani Sow, MD  Multiple Vitamins-Minerals (CENTRUM ADULTS PO) Take by mouth.   Yes [provider]  tamsulosin (FLOMAX) 0.4 MG CAPS capsule Take 2 capsules (0.8 mg total) by mouth daily after breakfast. 03/15/18  Yes Mikell, Jeani Sow, MD  aspirin 81 MG chewable tablet Chew 1 tablet (81 mg total) by mouth daily. Patient not taking: Reported on 03/21/2018 03/23/17   Tonette Bihari, MD  esomeprazole (NEXIUM) 40 MG capsule Take 1 capsule (40 mg total) by mouth daily. Patient not taking: Reported on 03/21/2018 11/27/17   Bonnita Hollow, MD  Olopatadine HCl 0.2 % SOLN Apply 1 drop to eye daily. Patient not taking: Reported on 03/21/2018 05/11/17   Tonette Bihari, MD  polyethylene glycol powder (GLYCOLAX/MIRALAX) powder Take 17 g by mouth 2 (two) times daily as needed. Patient not taking: Reported on 03/21/2018 11/27/17   Bonnita Hollow, MD    Family History Family History  Problem Relation Age of Onset  . Colon cancer Neg Hx     Social History Social History   Tobacco Use  . Smoking status: Never Smoker  . Smokeless tobacco: Never Used  Substance Use Topics  . Alcohol use: No  . Drug use: No     Allergies   Aspirin; Lubiprostone; Pantoprazole; and Vitamin c   Review of Systems Review of Systems  Respiratory: Negative for shortness of breath.   Cardiovascular: Positive for chest pain and leg swelling.  Gastrointestinal: Negative for abdominal pain and vomiting.  All other systems reviewed and are negative.    Physical  Exam Updated Vital Signs BP 128/81   Pulse (!) 54   Temp 97.8 F (36.6 C) (Oral)   Resp 17   Ht 5\' 7"  (1.702 m)   Wt 86.2 kg (190 lb)   SpO2 96%   BMI 29.76 kg/m   Physical Exam  Constitutional: He is oriented to person, place, and time. He appears well-developed and well-nourished.  HENT:  Head: Normocephalic and atraumatic.  Right Ear: External ear normal.  Left Ear: External ear normal.  Nose: Nose normal.  Eyes: Right eye exhibits no discharge. Left eye exhibits no discharge.  Neck: Neck supple.  Cardiovascular: Normal rate, regular rhythm, normal heart sounds and intact distal pulses.  Pulses:      Radial  pulses are 2+ on the right side, and 2+ on the left side.       Dorsalis pedis pulses are 2+ on the right side, and 2+ on the left side.  Pulmonary/Chest: Effort normal. He has decreased breath sounds (mild) in the right lower field and the left lower field. He exhibits no tenderness.  Abdominal: Soft. There is no tenderness.  Musculoskeletal:       Right lower leg: He exhibits edema (pitting edema to lower legs).       Left lower leg: He exhibits edema (pitting edema to lower legs).  Neurological: He is alert and oriented to person, place, and time.  Skin: Skin is warm and dry.  Nursing note and vitals reviewed.    ED Treatments / Results  Labs (all labs ordered are listed, but only abnormal results are displayed) Labs Reviewed  COMPREHENSIVE METABOLIC PANEL - Abnormal; Notable for the following components:      Result Value   Potassium 3.4 (*)    Creatinine, Ser 1.65 (*)    GFR calc non Af Amer 38 (*)    GFR calc Af Amer 44 (*)    All other components within normal limits  CBC WITH DIFFERENTIAL/PLATELET - Abnormal; Notable for the following components:   Hemoglobin 11.7 (*)    HCT 36.4 (*)    All other components within normal limits  D-DIMER, QUANTITATIVE (NOT AT [facility]) - Abnormal; Notable for the following components:   D-Dimer, Quant 0.63 (*)    All  other components within normal limits  BRAIN NATRIURETIC PEPTIDE  TROPONIN I    EKG EKG Interpretation  Date/Time:  Thursday Mar 21 2018 09:18:04 EDT Ventricular Rate:  63 PR Interval:    QRS Duration: 152 QT Interval:  457 QTC Calculation: 468 R Axis:   57 Text Interpretation:  Sinus rhythm Prolonged PR interval Right bundle branch block Anteroseptal infarct, age indeterminate RBBB new since 2014 Confirmed by Sherwood Gambler [phone]) on 03/21/2018 9:21:53 AM   Radiology Dg Chest 2 View  Result Date: 03/21/2018 CLINICAL DATA:  Chest pain. EXAM: CHEST - 2 VIEW COMPARISON:  Radiographs of October 30, 2012. FINDINGS: Borderline cardiomegaly is noted. No pneumothorax or pleural effusion is noted. Atherosclerosis of thoracic aorta is noted. Both lungs are clear. The visualized skeletal structures are unremarkable. IMPRESSION: No active cardiopulmonary disease. Aortic Atherosclerosis (ICD10-I70.0). Electronically Signed   By: Marijo Conception, M.D.   On: 03/21/2018 10:33    Procedures Procedures (including critical care time)  Medications Ordered in ED Medications  nitroGLYCERIN (NITROSTAT) SL tablet 0.4 mg (0.4 mg Sublingual Given 03/21/18 1121)  aspirin chewable tablet 324 mg (324 mg Oral Given 03/21/18 1119)     Initial Impression / Assessment and Plan / ED Course  I have reviewed the triage vital signs and the nursing notes.  Pertinent labs & imaging results that were available during my care of the patient were reviewed by me and considered in my medical decision making (see chart for details).     Patient chest pain is atypical.  However he has multiple risk factors for coronary disease and in fact 10 years ago had moderate coronary disease.  I doubt PE but given the leg swelling, d-dimer sent.  This is negative when age-adjusted.  Chest pain is much better.  I think with the risk factors he will need observation and ACS rule out.  Family practice to admit.  Final Clinical  Impressions(s) / ED Diagnoses   Final diagnoses:  Atypical chest pain    ED Discharge Orders    None       Sherwood Gambler, MD 03/21/18 1256

## 2018-03-21 NOTE — ED Notes (Signed)
Attempted report 

## 2018-03-21 NOTE — Consult Note (Signed)
Cardiology Consultation:   Patient ID: Omar Sanders; [id number]; [birth year]   Admit date: 03/21/2018 Date of Consult: 03/21/2018  Primary Care Provider: Tonette Bihari, MD Primary Cardiologist: Dr. Quay Burow, MD  Patient Profile:   Omar Sanders is a 77 y.o. male with a hx of HTN, HLD, DM 2, GERD, IBS, BPH and CKD stage III who is being seen today for the evaluation of chest pain at the request of Dr. Reesa Chew.  History of Present Illness:   Omar Sanders is a 77 year old male with a history stated above who presented to [facility] on 03/21/2018 with complaints non-radiating chest pain for approximately 3 days, rated a 2/10 in severity.  Patient describes that the pain is intermittent in duration and is not associated with exertion. He states that it has been worse while sleeping. He associates his symptoms with the start of his Metformin (3 days ago). Prior to that, he describes he has been in his normal state of health.  The pain is not reproducible.  Patient states he has had diaphoresis, mild nausea and dizziness at times. He reports LE swelling for the last year, however his current chest pain symptoms have only come on in the last 3 days. He denies shortness of breath, orthopnea, PND or syncope. He denies tobacco or alcohol use and is unaware of family history of CAD. There is a moderate language barrier, however we were able to communicate fairly well with understanding.   In the ED, initial troponin levels were negative at <0.03, <0.03.  He was given ASA and nitroglycerin which provided some relief.  EKG was performed which revealed NSR with new RBBB and T wave inversions in leads V1 through V4, a change  from prior EKG tracings.  CXR with no evidence of acute cardiopulmonary disease.  Creatinine markedly elevated at 1.65 with known CKD stage III.  D-dimer positive at 0.63, however after adjusted for age, is within normal range per internal medicine note.  Hemoglobin A1c noted to be elevated  at 6.9.  Patient was recently started on metformin 03/15/2018 for this.  Patient was initially seen by [place] in consultion in 2010 for chest pain. Subsequently, he was scheduled for a nuclear stress test which showed a small mild defect in the inferior and apical walls. He then underwent a cardiac cath in 05/2009 which revealed normal coronaries.  He was once again seen in 2014 in consultation for chest pain however, this was thought to be gastritis in nature.  Given persistent chest pain symptoms he was scheduled for Myoview stress test (10/2012) which showed normal function with no evidence of ischemia or prior infarction.    He was then seen in the office by Dr. Gwenlyn Found 05/2016 after being referred for preoperative clearance for an elective decompressive laminectomy.  He was set up once again for a Myoview stress test to risk stratify him.  On 06/20/2016 he underwent a Myoview stress which showed LVEF of 64% with normal wall motion, no T wave inversion, no ST segment deviation and was considered a low risk study.  EKG at that time showed NSR with first-degree AV block.  He was ultimately cleared for his surgery.  This was the last time that we have seen Omar Sanders.  Cardiology was consulted given complaints of chest pain and abnormal EKG.  Past Medical History:  Diagnosis Date  . Allergy   . Anal fissure   . Anemia   . Anxiety   . Benign prostatic hyperplasia   .  Chest pain 02/2018  . Chronic pain   . Colon polyp   . GERD (gastroesophageal reflux disease)   . Headache(784.0)   . Hemorrhoids   . Hyperlipidemia    Hx: of  . Hypertension   . IBS (irritable bowel syndrome)   . Incontinent of urine    Hx:of  . Migraines   . Neuromuscular disorder ([place])    periferal neuropathy per patient  . New onset right bundle branch block (RBBB)   . OSA (obstructive sleep apnea) 11/2009   sleep study with mild OSA, pt unwilling to use CPAP  . Seizures ([place])     Past Surgical History:  Procedure Laterality  Date  . 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 CATARACT EXTRACTION     bilat  . CHOLECYSTECTOMY  2008  . COLONOSCOPY  2012   Dr Collene Mares  . HEMORRHOID SURGERY     anal surgery  . INGUINAL HERNIA REPAIR Bilateral 03/11/2013   Procedure: LAPAROSCOPIC BILATERAL INGUINAL HERNIA REPAIR;  Surgeon: Ralene Ok, MD;  Location: [place];  Service: General;  Laterality: Bilateral;  . INSERTION OF MESH Bilateral 03/11/2013   Procedure: INSERTION OF MESH;  Surgeon: Ralene Ok, MD;  Location: [facility];  Service: General;  Laterality: Bilateral;  . NM MYOCAR PERF WALL MOTION  05/27/2009   mild iscemia mid inferio & apical regions.  . NM MYOCAR PERF WALL MOTION  11/01/2012   negative for ischemia  . US ECHOCARDIOGRAPHY  05/27/2009   mild mitral annular ca+,AOV mildly sclerotic     Prior to Admission medications   Medication Sig Start Date End Date Taking? Authorizing Provider  ALPRAZolam (XANAX) 0.5 MG tablet TAKE 1/2 TABLET BY MOUTH AT BEDTIME AS NEEDED FOR ANXIETY Patient taking differently: TAKE 1/2 TABLET BY MOUTH AT BEDTIME 03/15/18  Yes Mikell, Jeani Sow, MD  atorvastatin (LIPITOR) 40 MG tablet Take 1 tablet (40 mg total) by mouth daily. 03/23/17  Yes Mikell, Jeani Sow, MD  bisacodyl (DULCOLAX) 10 MG suppository Place 1 suppository (10 mg total) rectally as needed for moderate constipation. 08/03/17  Yes Mikell, Jeani Sow, MD  Calcium Carbonate Antacid (TUMS PO) Take by mouth.   Yes [provider]  Cholecalciferol (VITAMIN D) 2000 units tablet Take 1 tablet (2,000 Units total) by mouth daily. 07/16/17  Yes Mikell, Jeani Sow, MD  finasteride (PROSCAR) 5 MG tablet Take 1 tablet (5 mg total) by mouth daily. 10/09/17  Yes Mikell, Jeani Sow, MD  FLUoxetine (PROZAC) 10 MG tablet Take 1 tablet (10 mg total) by mouth daily. 01/29/18  Yes Mikell, Jeani Sow, MD  lisinopril (PRINIVIL,ZESTRIL) 5 MG tablet Take 1 tablet (5 mg total)  by mouth daily. 01/29/18  Yes Mikell, Jeani Sow, MD  metFORMIN (GLUCOPHAGE) 500 MG tablet Take 1 tablet (500 mg total) by mouth 2 (two) times daily with a meal. 03/15/18  Yes Mikell, Jeani Sow, MD  Multiple Vitamins-Minerals (CENTRUM ADULTS PO) Take by mouth.   Yes [provider]  tamsulosin (FLOMAX) 0.4 MG CAPS capsule Take 2 capsules (0.8 mg total) by mouth daily after breakfast. 03/15/18  Yes Mikell, Jeani Sow, MD  aspirin 81 MG chewable tablet Chew 1 tablet (81 mg total) by mouth daily. Patient not taking: Reported on 03/21/2018 03/23/17   Tonette Bihari, MD  esomeprazole (NEXIUM) 40 MG capsule Take 1 capsule (40 mg total) by mouth daily. Patient not taking: Reported on 03/21/2018 11/27/17   Bonnita Hollow, MD  Olopatadine HCl 0.2 % SOLN Apply 1 drop to eye daily. Patient not taking: Reported on 03/21/2018 05/11/17   Tonette Bihari, MD  polyethylene glycol powder (GLYCOLAX/MIRALAX) powder Take 17 g by mouth 2 (two) times daily as needed. Patient not taking: Reported on 03/21/2018 11/27/17   Bonnita Hollow, MD    Inpatient Medications: Scheduled Meds: . ALPRAZolam  0.25 mg Oral QHS  . [START ON 03/22/2018] atorvastatin  40 mg Oral Daily  . [START ON 03/22/2018] cholecalciferol  2,000 Units Oral Daily  . enoxaparin (LOVENOX) injection  40 mg Subcutaneous Q24H  . [START ON 03/22/2018] finasteride  5 mg Oral Daily  . [START ON 03/22/2018] FLUoxetine  10 mg Oral Daily  . insulin aspart  0-9 Units Subcutaneous TID WC  . [START ON 03/22/2018] lisinopril  5 mg Oral Daily  . [START ON 03/22/2018] tamsulosin  0.8 mg Oral QPC breakfast   Continuous Infusions:  PRN Meds: nitroGLYCERIN  Allergies:    Allergies  Allergen Reactions  . Aspirin Other (See Comments)    Abdominal pain   . Lubiprostone   . Pantoprazole   . Vitamin C     Unknown    Social History:   Social History   Socioeconomic History  . Marital status: Widowed    Spouse name: Not on file  .  Number of children: 5  . Years of education: Not on file  . Highest education level: Not on file  Occupational History  . Not on file  Social Needs  . Financial resource strain: Not on file  . Food insecurity:    Worry: Not on file    Inability: Not on file  . Transportation needs:    Medical: Not on file    Non-medical: Not on file  Tobacco Use  . Smoking status: Never Smoker  . Smokeless tobacco: Never Used  Substance and Sexual Activity  . Alcohol use: No  . Drug use: No  . Sexual activity: Not Currently  Lifestyle  . Physical activity:    Days per week: Not on file    Minutes per session: Not on file  . Stress: Not on file  Relationships  . Social connections:    Talks on phone: Not on file    Gets together: Not on file    Attends religious service: Not on file    Active member of club or organization: Not on file    Attends meetings of clubs or organizations: Not on file    Relationship status: Not on file  . Intimate partner violence:    Fear of current or ex partner: Not on file    Emotionally abused: Not on file    Physically abused: Not on file    Forced sexual activity: Not on file  Other Topics Concern  . Not on file  Social History Narrative  . Not on file    Family History:   Family History  Problem Relation Age of Onset  . Colon cancer Neg Hx    Family Status:  Family Status  Relation Name Status  . Mother  Deceased  . Father  Deceased  . Sister  Deceased  . Sister  Deceased  . Sister  Deceased  . Sister  Deceased  . Sister  Alive  . Sister  Alive  . Neg Hx  (Not Specified)    ROS:  Please see the history of present illness.  All other ROS reviewed and negative.     Physical Exam/Data:  Vitals:   03/21/18 0930 03/21/18 0933 03/21/18 1200 03/21/18 1317  BP: 128/81   122/76  Pulse: (!) 59  (!) 54 (!) 56  Resp: 19  17 18   Temp:    98 F (36.7 C)  TempSrc:    Oral  SpO2: 95%  96% 97%  Weight:  190 lb (86.2 kg)    Height:  5\' 7"   (1.702 m)     No intake or output data in the 24 hours ending 03/21/18 1446 Filed Weights   03/21/18 0933  Weight: 190 lb (86.2 kg)   Body mass index is 29.76 kg/m.   General: Elderly, well developed, well nourished, NAD Skin: Warm, dry, intact  Head: Normocephalic, atraumatic, clear, moist mucus membranes. Neck: Negative for carotid bruits. No JVD Lungs: Clear to ausculation bilaterally. No wheezes, rales, or rhonchi. Breathing is unlabored. Cardiovascular: RRR with S1 S2. No murmurs, rubs or gallops Abdomen: Soft, non-tender, non-distended with normoactive bowel sounds.  No obvious abdominal masses. MSK: Strength and tone appear normal for age. 5/5 in all extremities Extremities: 2+ BLE edema. No clubbing or cyanosis. DP/PT pulses 1+ bilaterally Neuro: Alert and oriented. No focal deficits. No facial asymmetry. MAE spontaneously. Psych: Responds to questions appropriately with normal affect.    EKG:  The EKG was personally reviewed and demonstrates: 03/21/2018 NSR with new LBBB, T wave inversions in leads V1 through V4>> new Telemetry:  Telemetry was personally reviewed and demonstrates: 03/21/2018 NSR HR 72  Relevant CV Studies:  ECHO:  Echocardiogram 05/27/2009: LVEF > 55%, no wall motion abnormalities.  Trileaflet aortic valve noted  Echocardiogram 03/21/2018: Pending  CATH:  Cardiac catheterization 06/04/2009: -Scanned document.  LVEF 60% with normal LAD. See document for further details.  Laboratory Data:  Chemistry Recent Labs  Lab 03/21/18 0955  NA 139  K 3.4*  CL 104  CO2 25  GLUCOSE 98  BUN 16  CREATININE 1.65*  CALCIUM 9.1  GFRNONAA 38*  GFRAA 44*  ANIONGAP 10    Total Protein  Date Value Ref Range Status  03/21/2018 7.3 6.5 - 8.1 g/dL Final  12/27/2017 7.6 6.0 - 8.5 g/dL Final   Albumin  Date Value Ref Range Status  03/21/2018 3.8 3.5 - 5.0 g/dL Final  12/27/2017 4.0 3.5 - 4.8 g/dL Final   AST  Date Value Ref Range Status  03/21/2018 26 15 -  41 U/L Final   ALT  Date Value Ref Range Status  03/21/2018 23 17 - 63 U/L Final   Alkaline Phosphatase  Date Value Ref Range Status  03/21/2018 49 38 - 126 U/L Final   Total Bilirubin  Date Value Ref Range Status  03/21/2018 0.8 0.3 - 1.2 mg/dL Final   Bilirubin Total  Date Value Ref Range Status  12/27/2017 0.4 0.0 - 1.2 mg/dL Final   Hematology Recent Labs  Lab 03/21/18 0955  WBC 6.2  RBC 4.49  HGB 11.7*  HCT 36.4*  MCV 81.1  MCH 26.1  MCHC 32.1  RDW 15.0  PLT 182   Cardiac Enzymes Recent Labs  Lab 03/21/18 0955 03/21/18 1315  TROPONINI <0.03 <0.03   No results for input(s): TROPIPOC in the last 168 hours.  BNP Recent Labs  Lab 03/21/18 0955  BNP 33.9    DDimer  Recent Labs  Lab 03/21/18 0955  DDIMER 0.63*   TSH:  Lab Results  Component Value Date   TSH 3.590 06/01/2017   Lipids: Lab Results  Component Value Date   CHOL 207 (H) 02/09/2017  HDL 33 (L) 02/09/2017   LDLCALC 105 (H) 02/09/2017   TRIG 347 (H) 02/09/2017   CHOLHDL 6.3 (H) 02/09/2017   HgbA1c: Lab Results  Component Value Date   HGBA1C 6.9 01/29/2018   Radiology/Studies:  Dg Chest 2 View  Result Date: 03/21/2018 CLINICAL DATA:  Chest pain. EXAM: CHEST - 2 VIEW COMPARISON:  Radiographs of October 30, 2012. FINDINGS: Borderline cardiomegaly is noted. No pneumothorax or pleural effusion is noted. Atherosclerosis of thoracic aorta is noted. Both lungs are clear. The visualized skeletal structures are unremarkable. IMPRESSION: No active cardiopulmonary disease. Aortic Atherosclerosis (ICD10-I70.0). Electronically Signed   By: Marijo Conception, M.D.   On: 03/21/2018 10:33   Assessment and Plan:   1.  Chest pain, r/o ACS: -Patient presented to [facility] ED on 03/21/2018 with complaints of anterior chest pain for approximately 3 days.  Patient has had episodes of chest pain in the past,  initially seen by Cardiology in 2010.  At that time he had a Lexiscan Myoview stress test which showed a  mild defect. Subsequently he underwent a cardiac catheterization which showed normal coronary arteries.  He was then seen once again in 2014 for chest pain. Stress test at that time was normal.  The next time he was seen was in 2017 for preoperative clearance for an elective laminectomy.  For risk stratify purposes, patient underwent a stress test which was again normal. -Troponin,<0.03,<0.03 -EKG revealed new RBBB and T wave inversions in leads V1 through V4.  This is a change from his prior EKG tracings -Echocardiogram this admission is pending>>results will likely guide further treatment -If decreased LVEF or recurrence of chest pain, could consider further ischemic evaluation -Will do one time IV Lasix 20 mg and monitor response -ASA 81, lisinopril 5 mg, atorvastatin 40 mg  2.  Uncontrolled DM2: -Hemoglobin A1c 6.9 on 01/29/2018 -Patient recently started on metformin 500 mg twice daily -SSI per primary team for glucose control  3.  HTN: -Stable, 122/76, 128/81, 126/83 -Continue home dose of lisinopril 5 mg  4. HLD: -Last lipid panel, 02/09/2017 with CHO-207, HDL-33, LDL-105, triglycerides-347 -Continue home atorvastatin, consider adding fibrate if elevated on current lipid panel -Will check lipid panel  5. GERD: -Stable, patient takes Tums and Nexium -Per primary team   For questions or updates, please contact [facility] Please consult [email or web address] for contact info under Cardiology/STEMI.   SignedKathyrn Drown NP-C [facility] Pager: [phone] 03/21/2018 2:46 PM

## 2018-03-21 NOTE — Progress Notes (Signed)
Pt c/o of chest heaviness and pain. States pain began after he ate dinner. After reviewing history, pt has hx of GERD. EKG done, no acute changes noted. Paged family medicine on call with updates. New orders received. Will try GI cocktail for now and continue to monitor.  Jacqlyn Larsen, RN

## 2018-03-21 NOTE — Plan of Care (Signed)
Reproducible intermittent chest pain. Denies SOB.

## 2018-03-21 NOTE — Significant Event (Signed)
Rapid Response Event Note  Overview: Chest Pressure  Initial Focused Assessment: Asked by RN to evaluate patient for ongoing chest pressure. RN had already administered NTG SL x 1, GI Cocktail x 1, and Xanax 0.25mg  x 1 and chest pressure currently is still 4/10. Patient is neuro intact, video-translator was used, patient endorses "pain around his heart, that radiates into his jaw". VSS, skin cool to touch, + pulses, not in acute distress. Prior to my arrival, troponins have been checked, all negative and EKG was done (no acute changes)  Interventions: -- FMTS MD paged and came up to assess patient.  -- NTG SL x 1 (2nd dose given at 2235), pain improved to 3/10 at 2240  Plan of Care: -- Should pressure persist - RN to page FMTS MD on call.    Event Summary:   at    [facility] Time [address] End Time [place]

## 2018-03-21 NOTE — H&P (Signed)
Patient seen and evaluated with the resident. I will cosign H&P once completed.

## 2018-03-21 NOTE — Progress Notes (Signed)
FPTS Interim Progress Note  S:paged by Puja, RN (rapid response) about patient c/o chest tightness  O: BP 133/70   Pulse (!) 58   Temp 97.7 F (36.5 C) (Oral)   Resp 17   Ht 5\' 7"  (1.702 m)   Wt 190 lb (86.2 kg)   SpO2 97%   BMI 29.76 kg/m     A/P: Went to evaluate patient, they had been given a nitro and felt it improved the tightness, which he said was the same discomfort that caused him to come to hospital.  It was unchanged in nature.  Vitals were stable and patient looked very comfortable.  We discussed reassuring ecg and troponins.  Will offer addidtional nitro, patient informed that we could offer additional anxiety med if needed  Last metformin was this morning (per cardiology, likely etiology)  Sherene Sires, DO 03/21/2018, 10:32 PM PGY-1, [facility] pager [phone]

## 2018-03-22 ENCOUNTER — Encounter (HOSPITAL_COMMUNITY): Payer: Self-pay | Admitting: Physician Assistant

## 2018-03-22 ENCOUNTER — Inpatient Hospital Stay (HOSPITAL_COMMUNITY): Payer: Medicare Other

## 2018-03-22 DIAGNOSIS — R079 Chest pain, unspecified: Secondary | ICD-10-CM

## 2018-03-22 DIAGNOSIS — R600 Localized edema: Secondary | ICD-10-CM

## 2018-03-22 DIAGNOSIS — I509 Heart failure, unspecified: Secondary | ICD-10-CM

## 2018-03-22 HISTORY — DX: Localized edema: R60.0

## 2018-03-22 LAB — BASIC METABOLIC PANEL
Anion gap: 10 (ref 5–15)
BUN: 20 mg/dL (ref 6–20)
CO2: 25 mmol/L (ref 22–32)
Calcium: 8.9 mg/dL (ref 8.9–10.3)
Chloride: 104 mmol/L (ref 101–111)
Creatinine, Ser: 1.64 mg/dL — ABNORMAL HIGH (ref 0.61–1.24)
GFR calc Af Amer: 45 mL/min — ABNORMAL LOW (ref >60)
GFR calc non Af Amer: 38 mL/min — ABNORMAL LOW (ref >60)
Glucose, Bld: 95 mg/dL (ref 65–99)
Potassium: 3.7 mmol/L (ref 3.5–5.1)
Sodium: 139 mmol/L (ref 135–145)

## 2018-03-22 LAB — CBC
HCT: 34.8 % — ABNORMAL LOW (ref 39.0–52.0)
Hemoglobin: 11.1 g/dL — ABNORMAL LOW (ref 13.0–17.0)
MCH: 25.9 pg — ABNORMAL LOW (ref 26.0–34.0)
MCHC: 31.9 g/dL (ref 30.0–36.0)
MCV: 81.1 fL (ref 78.0–100.0)
Platelets: 180 10*3/uL (ref 150–400)
RBC: 4.29 MIL/uL (ref 4.22–5.81)
RDW: 15.1 % (ref 11.5–15.5)
WBC: 5.9 10*3/uL (ref 4.0–10.5)

## 2018-03-22 LAB — TROPONIN I
Troponin I: <0.03 ng/mL (ref <0.03)
Troponin I: <0.03 ng/mL (ref <0.03)
Troponin I: <0.03 ng/mL (ref <0.03)
Troponin I: <0.03 ng/mL (ref <0.03)

## 2018-03-22 LAB — ECHOCARDIOGRAM COMPLETE
Height: 67 in
Weight: 3040 oz

## 2018-03-22 LAB — GLUCOSE, CAPILLARY
Glucose-Capillary: 131 mg/dL — ABNORMAL HIGH (ref 65–99)
Glucose-Capillary: 69 mg/dL (ref 65–99)
Glucose-Capillary: 96 mg/dL (ref 65–99)
Glucose-Capillary: 98 mg/dL (ref 65–99)

## 2018-03-22 MED ORDER — FUROSEMIDE 40 MG PO TABS
40.0000 mg | ORAL_TABLET | Freq: Every day | ORAL | Status: DC
  Administered 2018-03-23: 40 mg via ORAL
  Filled 2018-03-22: qty 1

## 2018-03-22 MED ORDER — POLYETHYLENE GLYCOL 3350 17 G PO PACK
17.0000 g | PACK | Freq: Every day | ORAL | Status: DC
  Administered 2018-03-22: 17 g via ORAL

## 2018-03-22 MED ORDER — FUROSEMIDE 10 MG/ML IJ SOLN
40.0000 mg | Freq: Once | INTRAMUSCULAR | Status: AC
  Administered 2018-03-22: 40 mg via INTRAVENOUS
  Filled 2018-03-22: qty 4

## 2018-03-22 MED ORDER — POLYETHYLENE GLYCOL 3350 17 G PO PACK
17.0000 g | PACK | Freq: Every day | ORAL | Status: DC | PRN
  Filled 2018-03-22: qty 1

## 2018-03-22 NOTE — Progress Notes (Signed)
  Echocardiogram 2D Echocardiogram has been performed.  Omar Sanders 03/22/2018, 2:25 PM

## 2018-03-22 NOTE — Progress Notes (Signed)
Progress Note  Patient Name: Omar Sanders Date of Encounter: 03/22/2018  Primary Cardiologist: Dr Gwenlyn Found   Subjective   Chest pain worse w/ deep inspiration, L chest has been tender, not now.  Concerned about LE edema, says legs will swell and that makes them itch.   Inpatient Medications    Scheduled Meds: . ALPRAZolam  0.25 mg Oral QHS  . atorvastatin  40 mg Oral Daily  . cholecalciferol  2,000 Units Oral Daily  . enoxaparin (LOVENOX) injection  40 mg Subcutaneous Q24H  . finasteride  5 mg Oral Daily  . FLUoxetine  10 mg Oral Daily  . insulin aspart  0-9 Units Subcutaneous TID WC  . lisinopril  5 mg Oral Daily  . tamsulosin  0.8 mg Oral QPC breakfast   Continuous Infusions:  PRN Meds: gi cocktail   Vital Signs    Vitals:   03/21/18 2146 03/21/18 2229 03/22/18 0438 03/22/18 0856  BP: 133/70 127/76 114/65 116/81  Pulse:  61 (!) 52   Resp:   20   Temp:   97.6 F (36.4 C)   TempSrc:   Oral   SpO2:  95% 96%   Weight:   190 lb (86.2 kg)   Height:        Intake/Output Summary (Last 24 hours) at 03/22/2018 1140 Last data filed at 03/22/2018 0948 Gross per 24 hour  Intake 1080 ml  Output 2300 ml  Net -1220 ml   Filed Weights   03/21/18 0933 03/22/18 0438  Weight: 190 lb (86.2 kg) 190 lb (86.2 kg)    Telemetry    SR, RBBB - Personally Reviewed  ECG    03/21/2018 NSR with new RBBB, T wave inversions in leads V1 through V4>> new from 2017 05/24, sinus brady w/ HR 57, RBBB and T wave inversions in V1-3  - Personally Reviewed  Physical Exam   General: Well developed, well nourished, male appearing in no acute distress. Head: Normocephalic, atraumatic.  Neck: Supple without bruits, JVD not elevated. Lungs:  Resp regular and unlabored, CTA. Heart: RRR, S1, S2, no S3, S4, or murmur; no rub. Abdomen: Soft, non-tender, non-distended with normoactive bowel sounds. No hepatomegaly. No rebound/guarding. No obvious abdominal masses. Extremities: No clubbing,  cyanosis, R>L 1+ LE edema. Chronic skin changes on lower legs w/out acute rash. Distal pedal pulses are 2+ bilaterally. Neuro: Alert and oriented X 3. Moves all extremities spontaneously. Psych: Normal affect.  Labs    Hematology Recent Labs  Lab 03/21/18 0955 03/22/18 0700  WBC 6.2 5.9  RBC 4.49 4.29  HGB 11.7* 11.1*  HCT 36.4* 34.8*  MCV 81.1 81.1  MCH 26.1 25.9*  MCHC 32.1 31.9  RDW 15.0 15.1  PLT 182 180    Chemistry Recent Labs  Lab 03/21/18 0955 03/22/18 0700  NA 139 139  K 3.4* 3.7  CL 104 104  CO2 25 25  GLUCOSE 98 95  BUN 16 20  CREATININE 1.65* 1.64*  CALCIUM 9.1 8.9  PROT 7.3  --   ALBUMIN 3.8  --   AST 26  --   ALT 23  --   ALKPHOS 49  --   BILITOT 0.8  --   GFRNONAA 38* 38*  GFRAA 44* 45*  ANIONGAP 10 10     Cardiac Enzymes Recent Labs  Lab 03/21/18 1315 03/21/18 1859 03/22/18 0108 03/22/18 0700  TROPONINI <0.03 <0.03 <0.03 <0.03   BNP Recent Labs  Lab 03/21/18 0955  BNP 33.9  DDimer  Recent Labs  Lab 03/21/18 0955  DDIMER 0.63*     Radiology    Dg Chest 2 View  Result Date: 03/21/2018 CLINICAL DATA:  Chest pain. EXAM: CHEST - 2 VIEW COMPARISON:  Radiographs of October 30, 2012. FINDINGS: Borderline cardiomegaly is noted. No pneumothorax or pleural effusion is noted. Atherosclerosis of thoracic aorta is noted. Both lungs are clear. The visualized skeletal structures are unremarkable. IMPRESSION: No active cardiopulmonary disease. Aortic Atherosclerosis (ICD10-I70.0). Electronically Signed   By: Marijo Conception, M.D.   On: 03/21/2018 10:33    Cardiac Studies   ECHO: ordered    Patient Profile     77 y.o. male w/ hx CP with neg cath 2010 and neg MV 2014 & 2017, HTN, HLD, DM 2, GERD, IBS, BPH and CKD stage III, was admitted 05/23 with chest pain.  Assessment & Plan     Active Problems: 1.  Chest pain - sx atypical, no objective evidence of ischemia - echo ordered, no further eval if EF nl and no WMA  2.  New onset  right bundle branch block (RBBB) - ck echo and follow  3. LE edema - f/u on echo - otherwise, per IM   SignedRosaria Ferries , PA-C 11:40 AM 03/22/2018 Pager: [phone]

## 2018-03-22 NOTE — Discharge Summary (Signed)
[facility] Discharge Summary  Patient name: Omar Sanders record number: [id number] Date of birth: Oct 31, 1940 Age: 77 y.o. 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  He has history of cath from 2010 with 50%occlusion of LAD.  ECGs indicated new RBBB and 1st degree heart block.  Troponins were consistently negative.  His discomfort would respond to nitro.  Cardiology suspected new start metformin to be the most likely cause of this new symptom so his metformin was held and a new ECHO showed 55-60% EF with dilated aorta. Based on ECHO, cardiology suggested CT of aorta which showed no aortic dissection.  Radiology read states based on dilation patient should get annual CT followup of aorta   Issues for Follow Up:  1. There is concern that his feeling of chest pressure could be a result of metformin side-effect.  His non-medicated A1C is only 6.9 so we are discharging without diabetes medication and are encouraging him to discuss alternative medication vs diet control with his pcp. 2. ECHO indicated dilated aorta  CT of aorta showed no  dissection but based on dilation annual CT aorta followup is suggested 3. Baseline creatinine on last admission was ~1.5.  He presented with ~1.6 and was d/c'd at 1/83 on this admission.  Given lack of urinary/abdominal complaints this could simply be new baseline.  Please consider followup to verify baseline. 4. Patient discharged with nitro prn.  Significant Procedures:   Significant Labs and Imaging:  Recent Labs  Lab 03/21/18 0955 03/22/18 0700 03/23/18 0105  WBC 6.2 5.9 7.2  HGB 11.7* 11.1* 11.6*  HCT 36.4* 34.8* 36.7*  PLT 182 180 193   Recent Labs  Lab 03/21/18 0955 03/22/18 0700 03/23/18 0105  NA 139 139 138  K 3.4* 3.7 3.7  CL 104 104 99*  CO2 25 25 26   GLUCOSE 98 95 93  BUN 16 20 22*  CREATININE 1.65* 1.64* 1.83*  CALCIUM 9.1 8.9 9.3  ALKPHOS 49  --   --   AST 26  --   --   ALT 23  --   --   ALBUMIN 3.8  --   --     Dg Chest 2 View  Result Date: 03/21/2018 CLINICAL DATA:  Chest pain. EXAM: CHEST - 2 VIEW COMPARISON:  Radiographs of October 30, 2012. FINDINGS: Borderline cardiomegaly is noted. No pneumothorax or pleural effusion is noted. Atherosclerosis of thoracic aorta is noted. Both lungs are clear. The visualized skeletal structures are unremarkable. IMPRESSION: No active cardiopulmonary disease. Aortic Atherosclerosis (ICD10-I70.0). Electronically Signed   By: Marijo Conception, M.D.   On: 03/21/2018 10:33    Results/Tests Pending at Time of Discharge:   Discharge Medications:  Allergies as of 03/23/2018      Reactions  Aspirin Other (See Comments)   Abdominal pain   Lubiprostone    Pantoprazole    Vitamin C    Unknown      Medication List    STOP taking these medications   metFORMIN 500 MG tablet Commonly known as:  GLUCOPHAGE     TAKE these medications   ALPRAZolam 0.5 MG tablet Commonly known as:  XANAX TAKE 1/2 TABLET BY MOUTH AT BEDTIME AS NEEDED FOR ANXIETY What changed:  See the new instructions.   aspirin 81 MG chewable  tablet Chew 1 tablet (81 mg total) by mouth daily.   atorvastatin 40 MG tablet Commonly known as:  LIPITOR Take 1 tablet (40 mg total) by mouth daily.   bisacodyl 10 MG suppository Commonly known as:  DULCOLAX Place 1 suppository (10 mg total) rectally as needed for moderate constipation.   CENTRUM ADULTS PO Take by mouth.   esomeprazole 40 MG capsule Commonly known as:  NEXIUM Take 1 capsule (40 mg total) by mouth daily.   finasteride 5 MG tablet Commonly known as:  PROSCAR Take 1 tablet (5 mg total) by mouth daily.   FLUoxetine 10 MG tablet Commonly known as:  PROZAC Take 1 tablet (10 mg total) by mouth daily.   lisinopril 5 MG tablet Commonly known as:  PRINIVIL,ZESTRIL Take 1 tablet (5 mg total) by mouth daily.   nitroGLYCERIN 0.3 MG SL tablet Commonly known as:  NITROSTAT Place 1 tablet (0.3 mg total) under the tongue every 5 (five) minutes as needed for chest pain.   Olopatadine HCl 0.2 % Soln Apply 1 drop to eye daily.   polyethylene glycol powder powder Commonly known as:  GLYCOLAX/MIRALAX Take 17 g by mouth 2 (two) times daily as needed.   tamsulosin 0.4 MG Caps capsule Commonly known as:  FLOMAX Take 2 capsules (0.8 mg total) by mouth daily after breakfast.   TUMS PO Take by mouth.   Vitamin D 2000 units tablet Take 1 tablet (2,000 Units total) by mouth daily.       Discharge Instructions: Please refer to Patient Instructions section of EMR for full details.  Patient was counseled important signs and symptoms that should prompt return to medical care, changes in medications, dietary instructions, activity restrictions, and follow up appointments.   Follow-Up Appointments: Follow-up Information    Tonette Bihari, MD. Go on 03/27/2018.   Specialty:  Family Medicine Why:  3:10pm Contact information: [place] [id number] [phone]           Sherene Sires, DO 03/23/2018, 11:59 PM PGY-1, [place] [facility]

## 2018-03-22 NOTE — Evaluation (Signed)
Physical Therapy Evaluation and Discharge Patient Details Name: Omar Sanders MRN: [id number] DOB: 02-06-1941 Today's Date: 03/22/2018   History of Present Illness  Omar Sanders is a 77 y.o. male presenting with chest pain. PMH is significant for CAD, T2DM, HTN, HLD, GERD, IBS, GAD, BPH, and CKD Stage III.   Clinical Impression  Patient evaluated by Physical Therapy with no further acute PT needs identified. Omar Sanders is very pleasant and our conversation was interpreted using the video interpretation service. He is functioning at a high level of ability although feels like he does not have the energy to walk as quickly as he normally does at baseline. HR 70-82 during evaluation with gait up to 200 feet and no instability noted. He does have very mild chest pain, RN provided meds at end of session. All education has been completed and the patient has no further questions. Please re-order physical therapy if there is any concerning decline in patient's functional ability. PT is signing off. Thank you for this referral.     Follow Up Recommendations None    Equipment Recommendations  None recommended by PT    Recommendations for Other Services       Precautions / Restrictions Precautions Precautions: None Restrictions Weight Bearing Restrictions: No      Mobility  Bed Mobility Overal bed mobility: Independent                Transfers Overall transfer level: Independent                  Ambulation/Gait Ambulation/Gait assistance: Independent Ambulation Distance (Feet): 200 Feet Assistive device: None Gait Pattern/deviations: Step-through pattern Gait velocity: WNL   General Gait Details: Gait WNL, no instability noted. Good balance with turns. Reports he is waking slower than his baseline however. Mild Lt sided chest pain reported. No DOE. HR 70-82 thoughout bout  Stairs            Wheelchair Mobility    Modified Rankin (Stroke Patients Only)        Balance Overall balance assessment: No apparent balance deficits (not formally assessed)                                           Pertinent Vitals/Pain Pain Assessment: Faces Faces Pain Scale: Hurts a little bit Pain Location: Lt side of chest Pain Descriptors / Indicators: Aching Pain Intervention(s): Monitored during session(RN provided meds at end of session)    Home Living Family/patient expects to be discharged to:: Private residence Living Arrangements: Children;Other relatives Available Help at Discharge: Family;Available 24 hours/day Type of Home: House Home Access: Level entry     Home Layout: Two level;Able to live on main level with bedroom/bathroom Home Equipment: Kasandra Knudsen - single point      Prior Function Level of Independence: Independent               Hand Dominance        Extremity/Trunk Assessment   Upper Extremity Assessment Upper Extremity Assessment: Defer to OT evaluation    Lower Extremity Assessment Lower Extremity Assessment: Overall WFL for tasks assessed       Communication   Communication: Prefers language other than English(vietnamese)  Cognition Arousal/Alertness: Awake/alert Behavior During Therapy: WFL for tasks assessed/performed Overall Cognitive Status: Within Functional Limits for tasks assessed  General Comments General comments (skin integrity, edema, etc.): Translated by [name] via [employer] [phone]    Exercises     Assessment/Plan    PT Assessment Patent does not need any further PT services  PT Problem List         PT Treatment Interventions      PT Goals (Current goals can be found in the Care Plan section)  Acute Rehab PT Goals Patient Stated Goal: Feel better PT Goal Formulation: All assessment and education complete, DC therapy    Frequency     Barriers to discharge        Co-evaluation               AM-PAC PT "6  Clicks" Daily Activity  Outcome Measure Difficulty turning over in bed (including adjusting bedclothes, sheets and blankets)?: None Difficulty moving from lying on back to sitting on the side of the bed? : None Difficulty sitting down on and standing up from a chair with arms (e.g., wheelchair, bedside commode, etc,.)?: None Help needed moving to and from a bed to chair (including a wheelchair)?: None Help needed walking in hospital room?: None Help needed climbing 3-5 steps with a railing? : None 6 Click Score: 24    End of Session Equipment Utilized During Treatment: Gait belt Activity Tolerance: Patient tolerated treatment well Patient left: in bed;with call bell/phone within reach Nurse Communication: Mobility status PT Visit Diagnosis: History of falling (Z91.81);Pain;Difficulty in walking, not elsewhere classified (R26.2)    Time: 8099-8338 PT Time Calculation (min) (ACUTE ONLY): 22 min   Charges:   PT Evaluation $PT Eval Moderate Complexity: 1 Mod     PT G Codes:        Elayne Snare, PT, DPT  Ellouise Newer 03/22/2018, 9:49 AM

## 2018-03-22 NOTE — Progress Notes (Signed)
Family Medicine Teaching Service Daily Progress Note Intern Pager: [phone]  Patient name: Omar Sanders Date of birth: May 18, 1941 Age: 77 y.o. Gender: male  Primary Care Provider: Tonette Bihari, MD Consultants: cardiology Code Status: full  Pt Overview and Major Events to Date:  5/23 admitted  Assessment and Plan: Omar Sanders is a 77 y.o. male presenting with chest pain x3 days. PMH is significant for CAD, T2DM, HTN, HLD, GERD, IBS, GAD, BPH, and CKD Stage III.    Chest pain, R/O ACS.  Pain still persistent, claims some improvement with nitro, comes and goes, Vitals stable overnigth 5/23.  Initial trop <0.03.  EKG with RBBB and 1st degree block which appears to be new. CXR negative.  d-dimer adjusted for age is within normal.  BNP normal. Per chart review, last ECHO July 2010 with EF 55-60%. -consult cardiology -continuous cardiac monitoring -trend troponins -repeat ECHO am 5/24 -obtain orthostatic vitals -I/O -daily weights -PT  -vitals per unit routine  -nitro PRN  CAD.  History of L heart cardiac cath 2010, revealed 50% stenosis of mid segment LAD and 40% stenosis of RCA.  No stents placed at that time.  Patient on statin, states he has stopped taking a daily aspirin a long time ago, unclear as to the reason.    T2DM, well controlled.  Last A1c 6.9.   Seen by PCP on 5/17 and Metformin increased to 500 mg BID.  Patient strongly feels his chest pain began after doing so.  -holding Metformin for now  -sensitive SSI  -monitor CBG  -will discuss alternate regimen vs no meds for outpatient as metformin may be contributory  HTN. Normotensive on admission.  At home on Lisinopril 5 mg.   -continue home antihypertensive   BPH.  At home on finasteride and flomax.  -continue home meds   GERD. Controlled.  Takes Tums at home.  Nexium 40 mg on medication list, he states he no longer uses this.    HLD.  At home on Lipitor 40 mg daily.    -continue statin   GAD.  Stable.  At home on Prozac 10 mg and Xanax 0.5 mg daily at bedtime.  -continue home meds  Vitamin D deficiency.  Last level 26.4 in Sept 2018.  Takes 2,000 IU daily.  -continue Vit d supplementation  FEN/GI: Heart healthy, saline lock IV  Prophylaxis: lovenox  Disposition: on tele, awaiting ECHO and cardiology dispo  Subjective: (examined with [place] translator on [place]) Very pleasant, feels pain is improving  Objective: Temp:  [97.6 F (36.4 C)-98 F (36.7 C)] 97.6 F (36.4 C) (05/24 0438) Pulse Rate:  [52-63] 52 (05/24 0438) Resp:  [14-20] 20 (05/24 0438) BP: (114-133)/(65-83) 114/65 (05/24 0438) SpO2:  [95 %-97 %] 96 % (05/24 0438) Weight:  [190 lb (86.2 kg)] 190 lb (86.2 kg) (05/24 0438) Physical Exam: General: very pleasant, NAD Cardiovascular: RRR, no murmur noted on exam, 1+ edema in ankles but with SCDs on this is unreliable fluid indicator Respiratory: CTAB, no crackles, no IWB, satting upper 90s on room air Abdomen: soft, no TTP Extremities: 1+ edema in ankles bilaterally (but on SCDs)  Laboratory: Recent Labs  Lab 03/21/18 0955  WBC 6.2  HGB 11.7*  HCT 36.4*  PLT 182   Recent Labs  Lab 03/21/18 0955  NA 139  K 3.4*  CL 104  CO2 25  BUN 16  CREATININE 1.65*  CALCIUM 9.1  PROT 7.3  BILITOT 0.8  ALKPHOS 49  ALT 23  AST 26  GLUCOSE 98    trops neg x4  Imaging/Diagnostic Tests: Dg Chest 2 View  Result Date: 03/21/2018 CLINICAL DATA:  Chest pain. EXAM: CHEST - 2 VIEW COMPARISON:  Radiographs of October 30, 2012. FINDINGS: Borderline cardiomegaly is noted. No pneumothorax or pleural effusion is noted. Atherosclerosis of thoracic aorta is noted. Both lungs are clear. The visualized skeletal structures are unremarkable. IMPRESSION: No active cardiopulmonary disease. Aortic Atherosclerosis (ICD10-I70.0). Electronically Signed   By: Marijo Conception, M.D.   On: 03/21/2018 10:33     Sherene Sires, DO 03/22/2018, 5:46  AM PGY-1, [place] Intern pager: [phone], text pages welcome

## 2018-03-22 NOTE — Progress Notes (Addendum)
Family Medicine Teaching Service Daily Progress Note Intern Pager: [phone]  Patient name: Omar Sanders record number: [id number] Date of birth: June 29, 1941 Age: 77 y.o. Gender: male  Primary Care Provider: Tonette Bihari, MD Consultants: cardiology Code Status: full  Pt Overview and Major Events to Date:  5/23 admitted  Assessment and Plan: Omar Sanders is a 77 y.o. male presenting with chest pain x3 days. PMH is significant for CAD, T2DM, HTN, HLD, GERD, IBS, GAD, BPH, and CKD Stage III.    Chest pain, R/O ACS.  pain resolved.  Vitals stable overnigth 5/24.  Initial trop <0.03.  EKG with RBBB and 1st degree block which appears to be new. CXR negative.  d-dimer adjusted for age is within normal.  BNP normal. Echo with EF 55-60% with aortic dilation -consult cardiology, recommendations pending after reading ECHO -continuous cardiac monitoring -I/O -daily weights -vitals per unit routine  -nitro PRN  CAD.  History of L heart cardiac cath 2010, revealed 50% stenosis of mid segment LAD and 40% stenosis of RCA.  No stents placed at that time.  Patient on statin, states he has stopped taking a daily aspirin a long time ago, unclear as to the reason.    T2DM, well controlled.  Last A1c 6.9.   Seen by PCP on 5/17 and Metformin increased to 500 mg BID.  Patient strongly feels his chest pain began after doing so.  -holding Metformin for now  -sensitive SSI  -monitor CBG  -will discuss alternate regimen vs no meds for outpatient as metformin may be contributory  CKD: now with elevated creatinine to 1.83, minimally increased from earlier this admission. But above the ~1.5 from prior admission in February.  Unsure if new baseline or AKI, no urinary symptoms -will discuss with AM team  HTN. Normotensive on admission.  At home on Lisinopril 5 mg.   -continue home antihypertensive   BPH.  At home on finasteride and flomax.  -continue home meds   GERD. Controlled.  Takes  Tums at home.  Nexium 40 mg on medication list, he states he no longer uses this.    HLD.  At home on Lipitor 40 mg daily.   -continue statin   GAD.  Stable.  At home on Prozac 10 mg and Xanax 0.5 mg daily at bedtime.  -continue home meds  Vitamin D deficiency.  Last level 26.4 in Sept 2018.  Takes 2,000 IU daily.  -continue Vit d supplementation  FEN/GI: Heart healthy, saline lock IV  Prophylaxis: lovenox  Disposition: on tele, awaiting cardiology dispo and decision about aortic CT/stress test  Subjective: (examined with [place] translator on [place]) pain resolved, would like to know if he can get nitro on d/c  Objective: Temp:  [97.6 F (36.4 C)-97.8 F (36.6 C)] 97.8 F (36.6 C) (05/24 2134) Pulse Rate:  [52-62] 62 (05/24 2134) Resp:  [19-20] 19 (05/24 2134) BP: (114-120)/(65-81) 120/80 (05/24 2134) SpO2:  [96 %] 96 % (05/24 2134) Weight:  [190 lb (86.2 kg)] 190 lb (86.2 kg) (05/24 0438) Physical Exam: General: NAD, pleasant  Eyes:  EOMI, no conjunctival pallor or injection ENTM: Moist mucous membranes,  Cardiovascular: RRR,2/6 murmur, 1+ LE edema Respiratory: CTA BL, normal work of breathing Gastrointestinal: soft, nontender, nondistended MSK: moves 4 extremities equally Derm: no rashes appreciated Neuro: CN II-XII grossly intact Psych: AOx3, appropriate affect   Laboratory: Recent Labs  Lab 03/21/18 0955 03/22/18 0700  WBC 6.2 5.9  HGB 11.7* 11.1*  HCT 36.4* 34.8*  PLT 182 180   Recent Labs  Lab 03/21/18 0955 03/22/18 0700  NA 139 139  K 3.4* 3.7  CL 104 104  CO2 25 25  BUN 16 20  CREATININE 1.65* 1.64*  CALCIUM 9.1 8.9  PROT 7.3  --   BILITOT 0.8  --   ALKPHOS 49  --   ALT 23  --   AST 26  --   GLUCOSE 98 95    Imaging/Diagnostic Tests: Dg Chest 2 View  Result Date: 03/21/2018 CLINICAL DATA:  Chest pain. EXAM: CHEST - 2 VIEW COMPARISON:  Radiographs of October 30, 2012. FINDINGS: Borderline cardiomegaly is noted. No pneumothorax or  pleural effusion is noted. Atherosclerosis of thoracic aorta is noted. Both lungs are clear. The visualized skeletal structures are unremarkable. IMPRESSION: No active cardiopulmonary disease. Aortic Atherosclerosis (ICD10-I70.0). Electronically Signed   By: Marijo Conception, M.D.   On: 03/21/2018 10:33     Sherene Sires, DO 03/22/2018, 11:24 PM PGY-1, [place] Intern pager: [phone], text pages welcome

## 2018-03-23 ENCOUNTER — Inpatient Hospital Stay (HOSPITAL_COMMUNITY): Payer: Medicare Other

## 2018-03-23 LAB — BASIC METABOLIC PANEL
Anion gap: 13 (ref 5–15)
BUN: 22 mg/dL — ABNORMAL HIGH (ref 6–20)
CO2: 26 mmol/L (ref 22–32)
Calcium: 9.3 mg/dL (ref 8.9–10.3)
Chloride: 99 mmol/L — ABNORMAL LOW (ref 101–111)
Creatinine, Ser: 1.83 mg/dL — ABNORMAL HIGH (ref 0.61–1.24)
GFR calc Af Amer: 39 mL/min — ABNORMAL LOW (ref >60)
GFR calc non Af Amer: 34 mL/min — ABNORMAL LOW (ref >60)
Glucose, Bld: 93 mg/dL (ref 65–99)
Potassium: 3.7 mmol/L (ref 3.5–5.1)
Sodium: 138 mmol/L (ref 135–145)

## 2018-03-23 LAB — CBC
HCT: 36.7 % — ABNORMAL LOW (ref 39.0–52.0)
Hemoglobin: 11.6 g/dL — ABNORMAL LOW (ref 13.0–17.0)
MCH: 25.6 pg — ABNORMAL LOW (ref 26.0–34.0)
MCHC: 31.6 g/dL (ref 30.0–36.0)
MCV: 80.8 fL (ref 78.0–100.0)
Platelets: 193 10*3/uL (ref 150–400)
RBC: 4.54 MIL/uL (ref 4.22–5.81)
RDW: 14.9 % (ref 11.5–15.5)
WBC: 7.2 10*3/uL (ref 4.0–10.5)

## 2018-03-23 LAB — TROPONIN I: Troponin I: <0.03 ng/mL (ref <0.03)

## 2018-03-23 LAB — GLUCOSE, CAPILLARY
Glucose-Capillary: 92 mg/dL (ref 65–99)
Glucose-Capillary: 136 mg/dL — ABNORMAL HIGH (ref 65–99)

## 2018-03-23 MED ORDER — NITROGLYCERIN 0.3 MG SL SUBL: 0.3000 mg | SUBLINGUAL_TABLET | SUBLINGUAL | 0 refills | Status: DC | PRN

## 2018-03-23 MED ORDER — IOPAMIDOL (ISOVUE-370) INJECTION 76%
INTRAVENOUS | Status: AC
  Administered 2018-03-23: 80 mL
  Filled 2018-03-23: qty 100

## 2018-03-23 NOTE — Progress Notes (Addendum)
Subjective:  Still complains of some vague chest discomfort.  Also complains of ankle swelling, liver problems, kidney problems, abdominal problems and headache.  Echocardiogram showed a dilated aorta of 4.3 and 4 and a dedicated CT was recommended.  Objective:  Vital Signs in the last 24 hours: BP 118/72   Pulse (!) 55   Temp 97.8 F (36.6 C) (Oral)   Resp 16   Ht 5\' 7"  (1.702 m)   Wt 84.3 kg (185 lb 12.8 oz)   SpO2 94%   BMI 29.10 kg/m   Physical Exam: [place] male currently in no acute distress Lungs:  Clear Cardiac:  Regular rhythm, normal S1 and S2, no S3 Extremities:  No edema present  Intake/Output from previous day: 05/24 0701 - 05/25 0700 In: 840 [P.O.:840] Out: 2300 [Urine:2300]  Weight Filed Weights   03/21/18 0933 03/22/18 0438 03/23/18 0444  Weight: 86.2 kg (190 lb) 86.2 kg (190 lb) 84.3 kg (185 lb 12.8 oz)    Lab Results: Basic Metabolic Panel: Recent Labs    03/22/18 0700 03/23/18 0105  NA 139 138  K 3.7 3.7  CL 104 99*  CO2 25 26  GLUCOSE 95 93  BUN 20 22*  CREATININE 1.64* 1.83*   CBC: Recent Labs    03/21/18 0955 03/22/18 0700 03/23/18 0105  WBC 6.2 5.9 7.2  NEUTROABS 2.9  --   --   HGB 11.7* 11.1* 11.6*  HCT 36.4* 34.8* 36.7*  MCV 81.1 81.1 80.8  PLT 182 180 193   Cardiac Enzymes: Cardiac Panel (last 3 results) Recent Labs    03/22/18 1306 03/22/18 1922 03/23/18 0105  TROPONINI <0.03 <0.03 <0.03    Telemetry: Sinus with first-degree AV block  Assessment/Plan:  1.  Chest discomfort with a dilated aorta noted on echocardiogram 2.  Multiple atypical noncardiac symptoms 3. Right bundle branch block with first-degree AV block on EKGExline 4.  New diabetes  Recommendations:  Obtain dedicated CT scan to look for aneurysm and dissection based on echo and recent chest pain.he does have some mild renal insufficiency and will hold his ACE inhibitor for the time being.    Kerry Hough  MD  [facility] Cardiology  03/23/2018, 11:03 AM

## 2018-03-26 ENCOUNTER — Encounter: Payer: Self-pay | Admitting: Internal Medicine

## 2018-03-26 DIAGNOSIS — I719 Aortic aneurysm of unspecified site, without rupture: Secondary | ICD-10-CM | POA: Insufficient documentation

## 2018-03-27 ENCOUNTER — Ambulatory Visit (INDEPENDENT_AMBULATORY_CARE_PROVIDER_SITE_OTHER): Payer: Medicare Other | Admitting: Internal Medicine

## 2018-03-27 ENCOUNTER — Encounter: Payer: Self-pay | Admitting: Internal Medicine

## 2018-03-27 ENCOUNTER — Other Ambulatory Visit: Payer: Self-pay

## 2018-03-27 VITALS — BP 98/62 | HR 64 | Temp 98.7°F | Ht 67.0 in | Wt 191.0 lb

## 2018-03-27 DIAGNOSIS — R079 Chest pain, unspecified: Secondary | ICD-10-CM

## 2018-03-27 DIAGNOSIS — N183 Chronic kidney disease, stage 3 unspecified: Secondary | ICD-10-CM

## 2018-03-27 DIAGNOSIS — E118 Type 2 diabetes mellitus with unspecified complications: Secondary | ICD-10-CM

## 2018-03-27 DIAGNOSIS — F411 Generalized anxiety disorder: Secondary | ICD-10-CM | POA: Diagnosis not present

## 2018-03-27 DIAGNOSIS — M7989 Other specified soft tissue disorders: Secondary | ICD-10-CM

## 2018-03-27 DIAGNOSIS — R600 Localized edema: Secondary | ICD-10-CM | POA: Diagnosis not present

## 2018-03-27 MED ORDER — FLUOXETINE HCL 10 MG PO TABS: 10.0000 mg | ORAL_TABLET | Freq: Every day | ORAL | 3 refills | Status: DC

## 2018-03-27 MED ORDER — TRIAMCINOLONE ACETONIDE 0.1 % EX OINT: 1.0000 "application " | TOPICAL_OINTMENT | Freq: Two times a day (BID) | CUTANEOUS | 0 refills | Status: DC

## 2018-03-27 NOTE — Assessment & Plan Note (Signed)
Given recent states he has had so many symptoms associated with metformin other than nausea and diarrhea We will try diet control given his A1c is only 6.9 Follow-up in 3 months for repeat A1c

## 2018-03-27 NOTE — Patient Instructions (Addendum)
Please take tylenol for your chest pain this will likely resolve with time. We will check your kidney function. I will give you a cream for your itchy skin

## 2018-03-27 NOTE — Assessment & Plan Note (Signed)
Reassured patient that there was no pill medication to give him for his venous stasis

## 2018-03-27 NOTE — Assessment & Plan Note (Addendum)
ACS ruled out in the hospital, given pain is reproducible on exam likely musculoskeletal Without ACS concern will stop nitroglycerin - patient has this pain multiple a day and I am afraid he will drop is blood pressure by overuse if we give him this medication  Consider Tylenol for the pain Follow up as needed

## 2018-03-27 NOTE — Progress Notes (Signed)
   [facility] Kerrin Mo, MD Phone: [phone]  Reason For Visit: Hospital Follow up   #Chest Pain  Patient was seen in the hospital for chest pain rule out. Troponins were negative x3, he did have a new right bundle branch block that was noted on the ECG. Cardiology ruled him out from a cardiac perspective with no abnormalities noted on echo and no further follow-up needed.  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  Patient states he still has some left sided chest pain, pain is reproducible when I press on his chest. Patient states that he thinks this is due to the metformin.  Discussed with patient at this time using diet modification alone for his type 2 diabetes  #Swelling in legs Patient continues to complain about his swelling in legs he also states he is now having itching in his legs.  Spent several minutes explaining to him that there is no medication that I can provide other than compression stockings and having him elevate his legs   Past Medical History Reviewed problem list.  Medications- reviewed and updated No additions to family history Social history- patient is a non smoker  Objective: BP 98/62   Pulse 64   Temp 98.7 F (37.1 C) (Oral)   Ht 5\' 7"  (1.702 m)   Wt 191 lb (86.6 kg)   SpO2 98%   BMI 29.91 kg/m  Gen: NAD, alert, cooperative with exam Cardio: regular rate and rhythm, S1S2 heard, no murmurs appreciated Pulm: clear to auscultation bilaterally, no wheezes, rhonchi or rales, chest tenderness noted on palpation Extremities: warm, well perfused,  Bilateral pitting edema at the ankle.   Assessment/Plan: See problem based a/p  Chest pain ACS ruled out in the hospital, given pain is reproducible on exam likely musculoskeletal Without ACS concern will stop nitroglycerin - patient has this pain multiple a day and I am afraid he will drop is blood pressure by overuse if we give him this medication   Consider Tylenol for the pain Follow up as needed   Diabetes [facility]) Given recent states he has had so many symptoms associated with metformin other than nausea and diarrhea We will try diet control given his A1c is only 6.9 Follow-up in 3 months for repeat A1c  Leg swelling Reassured patient that there was no pill medication to give him for his venous stasis

## 2018-03-28 LAB — BASIC METABOLIC PANEL
BUN/Creatinine Ratio: 13 (ref 10–24)
BUN: 19 mg/dL (ref 8–27)
CO2: 22 mmol/L (ref 20–29)
Calcium: 8.9 mg/dL (ref 8.6–10.2)
Chloride: 103 mmol/L (ref 96–106)
Creatinine, Ser: 1.46 mg/dL — ABNORMAL HIGH (ref 0.76–1.27)
GFR calc Af Amer: 53 mL/min/{1.73_m2} — ABNORMAL LOW (ref >59)
GFR calc non Af Amer: 45 mL/min/{1.73_m2} — ABNORMAL LOW (ref >59)
Glucose: 124 mg/dL — ABNORMAL HIGH (ref 65–99)
Potassium: 4.6 mmol/L (ref 3.5–5.2)
Sodium: 137 mmol/L (ref 134–144)

## 2018-04-10 ENCOUNTER — Other Ambulatory Visit: Payer: Self-pay | Admitting: *Deleted

## 2018-04-10 DIAGNOSIS — E785 Hyperlipidemia, unspecified: Secondary | ICD-10-CM

## 2018-04-10 MED ORDER — ATORVASTATIN CALCIUM 40 MG PO TABS: 40.0000 mg | ORAL_TABLET | Freq: Every day | ORAL | 3 refills | Status: DC

## 2018-04-22 ENCOUNTER — Ambulatory Visit (INDEPENDENT_AMBULATORY_CARE_PROVIDER_SITE_OTHER): Payer: Medicare Other | Admitting: Internal Medicine

## 2018-04-22 ENCOUNTER — Ambulatory Visit (HOSPITAL_COMMUNITY)
Admission: RE | Admit: 2018-04-22 | Discharge: 2018-04-22 | Disposition: A | Discharge status: Home / Self Care | Payer: Medicare Other | Source: Ambulatory Visit | Attending: Family Medicine | Admitting: Family Medicine

## 2018-04-22 ENCOUNTER — Telehealth: Payer: Self-pay

## 2018-04-22 ENCOUNTER — Encounter: Payer: Self-pay | Admitting: Internal Medicine

## 2018-04-22 VITALS — BP 110/80 | HR 53 | Temp 98.0°F | Ht 67.0 in | Wt 190.6 lb

## 2018-04-22 DIAGNOSIS — R21 Rash and other nonspecific skin eruption: Secondary | ICD-10-CM | POA: Diagnosis not present

## 2018-04-22 DIAGNOSIS — N400 Enlarged prostate without lower urinary tract symptoms: Secondary | ICD-10-CM | POA: Diagnosis not present

## 2018-04-22 DIAGNOSIS — M7989 Other specified soft tissue disorders: Secondary | ICD-10-CM

## 2018-04-22 DIAGNOSIS — F411 Generalized anxiety disorder: Secondary | ICD-10-CM | POA: Diagnosis not present

## 2018-04-22 MED ORDER — HYDROXYZINE HCL 10 MG PO TABS: 10.0000 mg | ORAL_TABLET | Freq: Every evening | ORAL | 0 refills | Status: DC | PRN

## 2018-04-22 MED ORDER — FINASTERIDE 5 MG PO TABS: 5.0000 mg | ORAL_TABLET | Freq: Every day | ORAL | 5 refills | Status: DC

## 2018-04-22 MED ORDER — HYDROCORTISONE 1 % EX LOTN: 1.0000 "application " | TOPICAL_LOTION | Freq: Two times a day (BID) | CUTANEOUS | 0 refills | Status: DC

## 2018-04-22 NOTE — Patient Instructions (Addendum)
Please continue hydrocortisone and hydroxyzine. Please obtain a ultrasound to rule out blood clot.

## 2018-04-22 NOTE — Progress Notes (Signed)
   [facility] Kerrin Mo, MD Phone: [phone]  Reason For Visit: Follow up   # Rash, Right Arm  -Patient states he has had a rash for about 2 weeks.  The rash is itchy.  He has used triamcinolone cream which she states does not work at all. however the hydrocortisone cream that he uses works quite well.  Denies any new medications.  Feels that the hydrocortisone cream has helped  # Leg swelling  Patient continues to have leg swelling.  He notes that today his right leg is larger than his left leg.  He denies any pain except in the foot.  He does note that the leg feels itchy.  He has been using the triamcinolone cream but states that does not help that much.  He feels like compression stockings do not work too well.  Past Medical History Reviewed problem list.  Medications- reviewed and updated No additions to family history Social history- patient is a non- smoker  Objective: BP 110/80 (BP Location: Left Arm, Patient Position: Sitting, Cuff Size: Normal)   Pulse (!) 53   Temp 98 F (36.7 C) (Oral)   Ht 5\' 7"  (1.702 m)   Wt 190 lb 9.6 oz (86.5 kg)   SpO2 91%   BMI 29.85 kg/m  Gen: NAD, alert, cooperative with exam Cardio: regular rate and rhythm, S1S2 heard, no murmurs appreciated Pulm: clear to auscultation bilaterally, no wheezes, rhonchi or rales Extremities: Right leg with 1+ pitting edema slightly larger than left leg, negative calf squeeze,  no pain with dorsiflexion Skin: Maculopapular rash noted on arm   Assessment/Plan: See problem based a/p  Right leg swelling Likely venous stasis, no pain with dorsiflexion or calf squeeze.  However will obtain Doppler to rule out DVT -Continue compression stockings -Continue triamcinolone cream for stasis dermatitis -Follow-up in 1 month  Rash and nonspecific skin eruption Continue hydrocortisone cream  provided patient with hydroxyzine to take at night to help with itching when he is sleeping

## 2018-04-22 NOTE — Assessment & Plan Note (Addendum)
Likely venous stasis, no pain with dorsiflexion or calf squeeze.  However will obtain Doppler to rule out DVT -Continue compression stockings -Continue triamcinolone cream for stasis dermatitis -Follow-up in 1 month

## 2018-04-22 NOTE — Telephone Encounter (Signed)
Received call from vascular lab that venuos duplex right leg is negative. They have let patient go home. Call back for questions is [phone]. Preceptor informed and note routed to PCP.  Danley Danker, RN [facility] RN)

## 2018-04-22 NOTE — Progress Notes (Signed)
*  Preliminary Results* Right lower extremity venous duplex completed. Right lower extremity is negative for deep vein thrombosis. There is no evidence of right Baker's cyst.  Attempted to call preliminary results to [phone], however there was no answer. Voicemail was left and patient has been discharged. Patient can be reached by phone if needed, and the lab can be reached at [phone] if needed.  04/22/2018 2:04 PM  Maudry Mayhew, BS, RVT, RDCS, RDMS

## 2018-04-22 NOTE — Assessment & Plan Note (Signed)
Continue hydrocortisone cream  provided patient with hydroxyzine to take at night to help with itching when he is sleeping

## 2018-04-23 DIAGNOSIS — R3915 Urgency of urination: Secondary | ICD-10-CM | POA: Diagnosis not present

## 2018-04-23 DIAGNOSIS — N401 Enlarged prostate with lower urinary tract symptoms: Secondary | ICD-10-CM | POA: Diagnosis not present

## 2018-04-30 ENCOUNTER — Other Ambulatory Visit: Payer: Self-pay | Admitting: Family Medicine

## 2018-04-30 DIAGNOSIS — F411 Generalized anxiety disorder: Secondary | ICD-10-CM

## 2018-04-30 NOTE — Telephone Encounter (Signed)
Pt would like a refill on his alprazolam sent to [facility] on [address]. Please advise

## 2018-05-01 MED ORDER — ALPRAZOLAM 0.5 MG PO TABS: ORAL_TABLET | ORAL | 0 refills | Status: DC

## 2018-05-01 NOTE — Telephone Encounter (Signed)
Per [facility] patient has not refilled Xanax since 03/15/18. Will given 30 tablet refill. Please encourage patient to come in for follow up with Behavioral Health and also with PCP for anxiety management.   Dalphine Handing, PGY-2 [facility] 05/01/2018 5:28 PM

## 2018-05-23 ENCOUNTER — Other Ambulatory Visit: Payer: Self-pay | Admitting: Family Medicine

## 2018-05-23 DIAGNOSIS — E785 Hyperlipidemia, unspecified: Secondary | ICD-10-CM

## 2018-05-23 MED ORDER — ATORVASTATIN CALCIUM 40 MG PO TABS: 40.0000 mg | ORAL_TABLET | Freq: Every day | ORAL | 3 refills | Status: DC

## 2018-05-23 NOTE — Telephone Encounter (Signed)
Pt called and would like to know if he can have his Lipitor refilled. He would like to be called when the request has been sent.

## 2018-05-23 NOTE — Telephone Encounter (Signed)
Please inform patient that lipitor has been refilled   Caroline More, DO, PGY-2 [facility] 05/23/2018 2:05 PM

## 2018-06-03 ENCOUNTER — Ambulatory Visit: Payer: Medicare Other | Admitting: Family Medicine

## 2018-06-07 ENCOUNTER — Encounter: Payer: Self-pay | Admitting: Family Medicine

## 2018-06-07 ENCOUNTER — Ambulatory Visit (INDEPENDENT_AMBULATORY_CARE_PROVIDER_SITE_OTHER): Payer: Medicare Other | Admitting: Family Medicine

## 2018-06-07 VITALS — BP 126/70 | HR 73 | Temp 97.9°F | Ht 67.0 in | Wt 186.6 lb

## 2018-06-07 DIAGNOSIS — F411 Generalized anxiety disorder: Secondary | ICD-10-CM

## 2018-06-07 DIAGNOSIS — I872 Venous insufficiency (chronic) (peripheral): Secondary | ICD-10-CM | POA: Diagnosis not present

## 2018-06-07 MED ORDER — TRIAMCINOLONE ACETONIDE 0.5 % EX OINT: 1.0000 "application " | TOPICAL_OINTMENT | Freq: Two times a day (BID) | CUTANEOUS | 0 refills | Status: DC

## 2018-06-07 MED ORDER — ALPRAZOLAM 0.5 MG PO TABS: ORAL_TABLET | ORAL | 0 refills | Status: DC

## 2018-06-07 NOTE — Patient Instructions (Signed)
It was great meeting you today! I think your leg itching is likely due to venous stasis. I will give you a much stronger cream to help clear up the itching from your legs and arms. I would also like for you to continue to wrap your leg and elevate them. Please let me know if the itching is not better in one week and I will have you go to dermatology clinic for a biopsy to see what is happening to your skin under a microscope.  You can take 325mg  table of tylenol every 4 hours or 2 tablets every 6 hours as needed for the headaches.

## 2018-06-10 ENCOUNTER — Other Ambulatory Visit: Payer: Self-pay | Admitting: Internal Medicine

## 2018-06-10 DIAGNOSIS — J302 Other seasonal allergic rhinitis: Secondary | ICD-10-CM

## 2018-06-11 ENCOUNTER — Encounter: Payer: Self-pay | Admitting: Family Medicine

## 2018-06-11 DIAGNOSIS — F411 Generalized anxiety disorder: Secondary | ICD-10-CM | POA: Insufficient documentation

## 2018-06-11 DIAGNOSIS — I872 Venous insufficiency (chronic) (peripheral): Secondary | ICD-10-CM | POA: Insufficient documentation

## 2018-06-11 NOTE — Progress Notes (Signed)
   HPI 77 year old male who presents for leg swelling and headache. He states that his leg swelling has been going on for months. He has had a lower extremity venous duplex which showed no dvt. Patient has had an ehco performed on 5/24 which showed normal EF and only grade I diastolic dysfunction. Patient states he has not been keeping his legs wrapped or elevating legs.  He has darkening skin changes from his ankles to around midshin on legs. He states these areas are extremely pruritic. The previous triamcinolone is not helping very much with the itching.  He states his headache is intermittent. Usually relieved with tylenol. He is having no auras or any other symptoms.  CC: leg swelling, headache   ROS:  Review of Systems See HPI for ROS.   CC, SH/smoking status, and VS noted  Objective: BP 126/70 (BP Location: Left Arm, Patient Position: Sitting, Cuff Size: Normal)   Pulse 73   Temp 97.9 F (36.6 C) (Oral)   Ht 5\' 7"  (1.702 m)   Wt 186 lb 9.6 oz (84.6 kg)   SpO2 97%   BMI 29.23 kg/m  Gen: NAD, alert, cooperative, and pleasant. Well appearing elderly asian male, no acute distress HEENT: NCAT, EOMI, PERRL CV: RRR, no murmur Resp: CTAB, no wheezes, non-labored Abd: SNTND, BS present, no guarding or organomegaly Ext: No edema, warm Neuro: Alert and oriented, Speech clear, No gross deficits BLE: 1-2+ pitting edema, stocking-like skin darkening BLE. Excoriation noted.   Assessment and plan:  Anxiety state Refilled chronic xanax. Reviewed NCCSD and this was appropropriate.  Venous stasis dermatitis of both lower extremities Patient with physical exam findings consistent with venous stasis. His intense pruritis is likely due to dermatitis. Spent significant amount of time reviewing pathophys and treatment options for venous stasis with patient via Psychiatric nurse. He has not been keeping legs wrapped. Would like to wrap more tightly with ace wrap for one week and see if  his symptoms improve. Patient to make choice if ace wrap helping him vs buying well fitting but looser compression stockings. Stressed need to keep legs elevated when able. - wrap and elevate legs - triamcinolone for itching, informed patient to only take 0.5% formulation for one week and then switch back to 0.1% formulation    No orders of the defined types were placed in this encounter.   Meds ordered this encounter  Medications  . triamcinolone ointment (KENALOG) 0.5 %    Sig: Apply 1 application topically 2 (two) times daily. For moderate to severe eczema.  Do not use for more than 1 week at a time.    Dispense:  60 g    Refill:  0  . ALPRAZolam (XANAX) 0.5 MG tablet    Sig: TAKE 1/2 TABLET BY MOUTH AT BEDTIME AS NEEDED FOR ANXIETY    Dispense:  30 tablet    Refill:  0     Guadalupe Dawn MD PGY-2 Family Medicine Resident  06/11/2018 11:38 AM

## 2018-06-11 NOTE — Assessment & Plan Note (Signed)
Refilled chronic xanax. Reviewed NCCSD and this was appropropriate.

## 2018-06-11 NOTE — Assessment & Plan Note (Addendum)
Patient with physical exam findings consistent with venous stasis. His intense pruritis is likely due to dermatitis. Spent significant amount of time reviewing pathophys and treatment options for venous stasis with patient via Psychiatric nurse. He has not been keeping legs wrapped. Would like to wrap more tightly with ace wrap for one week and see if his symptoms improve. Patient to make choice if ace wrap helping him vs buying well fitting but looser compression stockings. Stressed need to keep legs elevated when able. - wrap and elevate legs - triamcinolone for itching, informed patient to only take 0.5% formulation for one week and then switch back to 0.1% formulation

## 2018-06-14 ENCOUNTER — Telehealth: Payer: Self-pay | Admitting: *Deleted

## 2018-06-14 NOTE — Telephone Encounter (Signed)
Pt was told to call Dr. Kris Mouton back if the cream was not helping.  Will forward message to MD. Clinton Sawyer, Salome Spotted, [place]

## 2018-06-14 NOTE — Telephone Encounter (Signed)
Pt calls to

## 2018-06-17 NOTE — Telephone Encounter (Signed)
Attempted to reach patient on the phone with vietnamese interpreter. Left message saying he can try topical capsaicin ointment which can be found over the counter at all pharmacies and most grocery stores. If that does not work then he will need to come see his PCP as he might need a dermatology referral. If possible please try and reach patient and let him know. If he calls back also share this advice.  Guadalupe Dawn MD PGY-2 Family Medicine Resident

## 2018-06-18 ENCOUNTER — Telehealth: Payer: Self-pay | Admitting: Family Medicine

## 2018-06-18 NOTE — Telephone Encounter (Signed)
Pt is calling and said he has a prescription for a Nasal spray that needs refilled. I did not see one on his med list. Pt's english is very hard to understand so it was hard to discuss with him what needs to be done. He said he called the pharmacy already. He would like to know if he can have a refill or a prescription sent in for a nasal spray.

## 2018-06-19 NOTE — Telephone Encounter (Signed)
Attempted to call patient using Electrical engineer for [place]. Left VM on home phone stating that it is unclear which nasal spray he is requesting as there is no nasal spray listed on his medication list. Per chart review 1 year ago patient was placed on flonase but this is no longer on medication list. Asked patient to please clarify which nasal spray he is requesting. Instructed patient that he may also contact pharmacy if he is unsure and they can send [place] a refill request. Left call back number for [facility] clinic phone number if patient has any questions.   Dalphine Handing, PGY-2 [facility] 06/19/2018 8:42 AM

## 2018-07-28 NOTE — Progress Notes (Signed)
   Subjective:    Patient ID: [facility], male    DOB: Nov 11, 1940, 77 y.o.   MRN: [id number]   CC: f/u   HPI: Constipation  Patient today presenting with complaints of chronic constipation.  Says he feels bloated.  Says that he has had issues with constipation since he was a teenager.  Patient uses stool softeners and laxatives such as senna at home every other day to help have regular bowel movements.  Patient has a bowel movement every other day.  Patient does not use MiraLAX as he said it does not help.  Patient does not drink much water, only 4-5 very small bottles of water a day.  Patient's consists of vegetables, fish, meat.  Has started eating brown rice after recommendations from [name].  Kidney issues  Patient states that he was told that he had "weak kidneys".  5 to 6 months ago he had increased frequency and has been seen by urology since.  Patient is worried about his kidneys declining.  States that urology is following urinary frequency and is giving him medications for this.  Per chart review he has been given Flomax.  Patient had urology follow-up today. No other urinary symptoms present    Objective:  BP 118/70   Pulse 61   Temp 97.8 F (36.6 C) (Oral)   Wt 181 lb 3.2 oz (82.2 kg)   SpO2 97%   BMI 28.38 kg/m  Vitals and nursing note reviewed  General: well nourished, in no acute distress HEENT: normocephalic  Neck: supple, non-tender, without lymphadenopathy Cardiac: RRR, clear S1 and S2, no murmurs, rubs, or gallops Respiratory: clear to auscultation bilaterally, no increased work of breathing Abdomen: soft, nontender, nondistended, no masses or organomegaly. Bowel sounds present Extremities: no edema or cyanosis. Warm, well perfused. No CVA tenderness  Skin: warm and dry, no rashes noted Neuro: alert and oriented, no focal deficits   Assessment & Plan:    Constipation Patient having bowel movements every other day with the help of senna.  Has not been  using MiraLAX as previously prescribed.  Advised patient to use MiraLAX daily and up to twice daily if needed.  Encourage increased fluid and fiber intake as well.  Discussed with patient that he can use senna as needed if bowels do not become regular with MiraLAX alone.  Likely patient is having regular bowel movements and no concern for obstruction at this time.  Previous ultrasounds and CTs of abdomen have all been normal. Colonoscopy from 2018 showing 4 polyps but otherwise normal, recommended for colonoscopy in 3-5 years. Chart review shows that patient does have a history of irritable bowel syndrome and was told to use MiraLAX as needed. -MiraLAX daily up to twice daily -Senna as needed -Conservative measures of fluid hydration and fiber intake -Follow up in 1 month if no improvement  Chronic kidney disease Patient with previous history of chronic kidney disease.  Patient concerned as he has not had kidneys evaluated in some time.  Will obtain BMP today to assess creatinine and GFR.  Can consider nephrology referral pending lab results.  Seasonal allergies Refill for loratadine and Flonase given  Healthcare maintenance Flu shot given today    Return in about 1 week (around 08/05/2018).   Caroline More, DO, PGY-2

## 2018-07-29 ENCOUNTER — Other Ambulatory Visit: Payer: Self-pay

## 2018-07-29 ENCOUNTER — Ambulatory Visit (INDEPENDENT_AMBULATORY_CARE_PROVIDER_SITE_OTHER): Payer: Medicare Other | Admitting: Family Medicine

## 2018-07-29 ENCOUNTER — Encounter: Payer: Self-pay | Admitting: Family Medicine

## 2018-07-29 VITALS — BP 118/70 | HR 61 | Temp 97.8°F | Wt 181.2 lb

## 2018-07-29 DIAGNOSIS — J302 Other seasonal allergic rhinitis: Secondary | ICD-10-CM

## 2018-07-29 DIAGNOSIS — E118 Type 2 diabetes mellitus with unspecified complications: Secondary | ICD-10-CM | POA: Diagnosis not present

## 2018-07-29 DIAGNOSIS — Z0000 Encounter for general adult medical examination without abnormal findings: Secondary | ICD-10-CM

## 2018-07-29 DIAGNOSIS — K5900 Constipation, unspecified: Secondary | ICD-10-CM

## 2018-07-29 DIAGNOSIS — R3915 Urgency of urination: Secondary | ICD-10-CM | POA: Diagnosis not present

## 2018-07-29 DIAGNOSIS — R609 Edema, unspecified: Secondary | ICD-10-CM | POA: Diagnosis not present

## 2018-07-29 DIAGNOSIS — Z23 Encounter for immunization: Secondary | ICD-10-CM | POA: Diagnosis not present

## 2018-07-29 DIAGNOSIS — N189 Chronic kidney disease, unspecified: Secondary | ICD-10-CM

## 2018-07-29 DIAGNOSIS — E785 Hyperlipidemia, unspecified: Secondary | ICD-10-CM

## 2018-07-29 MED ORDER — FLUTICASONE PROPIONATE 50 MCG/ACT NA SUSP: 2.0000 | Freq: Every day | NASAL | 6 refills | Status: DC

## 2018-07-29 MED ORDER — POLYETHYLENE GLYCOL 3350 17 GM/SCOOP PO POWD: 17.0000 g | Freq: Every day | ORAL | 1 refills | Status: DC

## 2018-07-29 MED ORDER — LORATADINE 10 MG PO TABS: 10.0000 mg | ORAL_TABLET | Freq: Every day | ORAL | 11 refills | Status: DC

## 2018-07-29 NOTE — Assessment & Plan Note (Signed)
Patient with previous history of chronic kidney disease.  Patient concerned as he has not had kidneys evaluated in some time.  Will obtain BMP today to assess creatinine and GFR.  Can consider nephrology referral pending lab results.

## 2018-07-29 NOTE — Patient Instructions (Signed)
Constipation, Adult Constipation is when a person:  Poops (has a bowel movement) fewer times in a week than normal.  Has a hard time pooping.  Has poop that is dry, hard, or bigger than normal.  Follow these instructions at home: Eating and drinking   Eat foods that have a lot of fiber, such as: ? Fresh fruits and vegetables. ? Whole grains. ? Beans.  Eat less of foods that are high in fat, low in fiber, or overly processed, such as: ? [place] fries. ? Hamburgers. ? Cookies. ? Candy. ? Soda.  Drink enough fluid to keep your pee (urine) clear or pale yellow. General instructions  Exercise regularly or as told by your doctor.  Go to the restroom when you feel like you need to poop. Do not hold it in.  Take over-the-counter and prescription medicines only as told by your doctor. These include any fiber supplements.  Do pelvic floor retraining exercises, such as: ? Doing deep breathing while relaxing your lower belly (abdomen). ? Relaxing your pelvic floor while pooping.  Watch your condition for any changes.  Keep all follow-up visits as told by your doctor. This is important. Contact a doctor if:  You have pain that gets worse.  You have a fever.  You have not pooped for 4 days.  You throw up (vomit).  You are not hungry.  You lose weight.  You are bleeding from the anus.  You have thin, pencil-like poop (stool). Get help right away if:  You have a fever, and your symptoms suddenly get worse.  You leak poop or have blood in your poop.  Your belly feels hard or bigger than normal (is bloated).  You have very bad belly pain.  You feel dizzy or you faint. This information is not intended to replace advice given to you by your health care provider. Make sure you discuss any questions you have with your health care provider. Document Released: 04/03/2008 Document Revised: 05/05/2016 Document Reviewed: 04/05/2016 Elsevier Interactive Patient Education   [employer].  It was a pleasure seeing you today.   Today we discussed your constipation and kidney  For your constipation: please use miralax daily, can increase to twice a day if needed. Increase fiber intake. You can still use senna as needed  For your kidney: I will recheck labs today. Continue to follow up with urology   Please follow up in 1 week for diabetes and 1 month for constipation or sooner if symptoms persist or worsen. Please call the clinic immediately if you have any concerns.   Our clinic's number is [phone]. Please call with questions or concerns.   Thank you,  Caroline More, DO

## 2018-07-29 NOTE — Assessment & Plan Note (Addendum)
Patient having bowel movements every other day with the help of senna.  Has not been using MiraLAX as previously prescribed.  Advised patient to use MiraLAX daily and up to twice daily if needed.  Encourage increased fluid and fiber intake as well.  Discussed with patient that he can use senna as needed if bowels do not become regular with MiraLAX alone.  Likely patient is having regular bowel movements and no concern for obstruction at this time.  Previous ultrasounds and CTs of abdomen have all been normal. Colonoscopy from 2018 showing 4 polyps but otherwise normal, recommended for colonoscopy in 3-5 years. Chart review shows that patient does have a history of irritable bowel syndrome and was told to use MiraLAX as needed. -MiraLAX daily up to twice daily -Senna as needed -Conservative measures of fluid hydration and fiber intake -Follow up in 1 month if no improvement

## 2018-07-29 NOTE — Assessment & Plan Note (Signed)
Flu shot given today

## 2018-07-29 NOTE — Assessment & Plan Note (Signed)
Refill for loratadine and Flonase given

## 2018-07-30 LAB — BASIC METABOLIC PANEL
BUN/Creatinine Ratio: 15 (ref 10–24)
BUN: 23 mg/dL (ref 8–27)
CO2: 23 mmol/L (ref 20–29)
Calcium: 9.3 mg/dL (ref 8.6–10.2)
Chloride: 104 mmol/L (ref 96–106)
Creatinine, Ser: 1.53 mg/dL — ABNORMAL HIGH (ref 0.76–1.27)
GFR calc Af Amer: 49 mL/min/{1.73_m2} — ABNORMAL LOW (ref >59)
GFR calc non Af Amer: 43 mL/min/{1.73_m2} — ABNORMAL LOW (ref >59)
Glucose: 103 mg/dL — ABNORMAL HIGH (ref 65–99)
Potassium: 4.4 mmol/L (ref 3.5–5.2)
Sodium: 142 mmol/L (ref 134–144)

## 2018-07-30 LAB — LIPID PANEL
Chol/HDL Ratio: 7.3 ratio — ABNORMAL HIGH (ref 0.0–5.0)
Cholesterol, Total: 191 mg/dL (ref 100–199)
HDL: 26 mg/dL — ABNORMAL LOW (ref >39)
LDL Calculated: 114 mg/dL — ABNORMAL HIGH (ref 0–99)
Triglycerides: 256 mg/dL — ABNORMAL HIGH (ref 0–149)
VLDL Cholesterol Cal: 51 mg/dL — ABNORMAL HIGH (ref 5–40)

## 2018-08-02 ENCOUNTER — Other Ambulatory Visit: Payer: Self-pay | Admitting: Family Medicine

## 2018-08-02 DIAGNOSIS — N183 Chronic kidney disease, stage 3 unspecified: Secondary | ICD-10-CM

## 2018-08-02 MED ORDER — ATORVASTATIN CALCIUM 40 MG PO TABS: 40.0000 mg | ORAL_TABLET | Freq: Every day | ORAL | 3 refills | Status: DC

## 2018-08-02 NOTE — Progress Notes (Signed)
Patient with recent labwork showing worsening renal function. Will plan to refer patient to nephrology.   Lab work also showing elevated cholesterol. ASCVD risk 24.0%, patient should be on statin. Per Chart review patient was on atorvastatin 40mg  but has self discontinued. Will discuss with patient and recommend restarting statin. RX for atorvastatin sent to pharmacy.  Message left on VM of patient via [place] for [place] as there was no answer.   Dalphine Handing, PGY-2 [facility] 08/02/2018 5:40 PM

## 2018-08-08 ENCOUNTER — Other Ambulatory Visit: Payer: Self-pay | Admitting: Family Medicine

## 2018-09-06 ENCOUNTER — Other Ambulatory Visit: Payer: Self-pay

## 2018-09-06 NOTE — Patient Outreach (Signed)
[facility]) Care Management  09/06/2018  MONG NEAL 1941-01-19 [id number]   Medication Adherence call to Mr. Esmeralda Links patient did not answer patient is due on Metformin 500 mg under [place].   [place] Management Direct Dial [phone]  Fax [phone] [email or web address]

## 2018-09-11 ENCOUNTER — Encounter: Payer: Self-pay | Admitting: Family Medicine

## 2018-09-19 ENCOUNTER — Ambulatory Visit (INDEPENDENT_AMBULATORY_CARE_PROVIDER_SITE_OTHER): Payer: Medicare Other | Admitting: Family Medicine

## 2018-09-19 VITALS — BP 132/74 | HR 53 | Temp 98.1°F | Wt 182.0 lb

## 2018-09-19 DIAGNOSIS — E1122 Type 2 diabetes mellitus with diabetic chronic kidney disease: Secondary | ICD-10-CM

## 2018-09-19 DIAGNOSIS — R3912 Poor urinary stream: Secondary | ICD-10-CM

## 2018-09-19 DIAGNOSIS — R39198 Other difficulties with micturition: Secondary | ICD-10-CM

## 2018-09-19 DIAGNOSIS — N401 Enlarged prostate with lower urinary tract symptoms: Secondary | ICD-10-CM

## 2018-09-19 DIAGNOSIS — R300 Dysuria: Secondary | ICD-10-CM

## 2018-09-19 LAB — POCT URINALYSIS DIP (MANUAL ENTRY)
Bilirubin, UA: NEGATIVE
Blood, UA: NEGATIVE
Glucose, UA: NEGATIVE mg/dL
Ketones, POC UA: NEGATIVE mg/dL
Leukocytes, UA: NEGATIVE
Nitrite, UA: NEGATIVE
Protein Ur, POC: NEGATIVE mg/dL
Spec Grav, UA: <=1.005 — AB (ref 1.010–1.025)
Urobilinogen, UA: 0.2 E.U./dL
pH, UA: 6 (ref 5.0–8.0)

## 2018-09-19 LAB — POCT GLYCOSYLATED HEMOGLOBIN (HGB A1C): HbA1c, POC (prediabetic range): 6.3 % (ref 5.7–6.4)

## 2018-09-19 NOTE — Progress Notes (Addendum)
   Subjective:   Patient ID: Omar [facility]    DOB: 11/15/1940, 77 y.o. male   MRN: [id number]  CC: low back pain, difficulty urinating   HPI: Omar Sanders is a 77 y.o. male who presents to clinic today for the following issues.    Low back pain Does not recall when it started, pain is intermittent, has recently gotten worse over the past 1 month.  No recent falls or trauma.  Does not take any pain medications. No dysuria, urgency or frequency.  No flank pain, fever, chills, nausea, vomiting.  He went to physical therapy last year which he feels helped.     Difficulty urinating  Chronic, has history of BPH and chronic kidney disease.  Takes flomax and finasteride for symptoms.  Reports dysuria, denies urgency and frequency.  Abdomen sometimes feels full 2/2 not emptying bladder completely. No fever, chills, nausea or vomiting.     Denies blood in urine.  No history of tobacco use.   No recent illnesses.  He states he has seen [facility] and has another appointment on December 16th.   ROS: No fever, chills, nausea, vomiting.  No abdominal pain.   Social: pt is a never smoker.  Medications reviewed. Objective:   BP 132/74   Pulse (!) 53   Temp 98.1 F (36.7 C) (Oral)   Wt 182 lb (82.6 kg)   SpO2 98%   BMI 28.51 kg/m  Vitals and nursing note reviewed.  General: 77 yo male, NAD  Neck: supple  CV: RRR no MRG  Lungs: CTAB, normal effort  Abdomen: soft, NTND, no suprapubic tenderness, no CVA tenderness, no masses or organomegaly, +bs  Skin: warm, dry, no rash  Extremities: warm and well perfused, normal tone Neuro: alert, oriented x3, no focal deficits   Assessment & Plan:   Low back pain Recommend using Tylenol and avoid NSAIDs given his CKD.  Offered him physical therapy again as this has helped in the past.  Patient declines but states he may want this if not improving.   BPH (benign prostatic hyperplasia) Reports difficulty with urination over several months- follows  with Urology at [facility].  UA performed at [facility] today which showed no evidence of infection, negative nitrite and no leuks. Less likely pyelo given no CVA tenderness, fever or chills.  Do not suspect acute urinary retention given no suprapubic tenderness or fullness on exam, able to void to give urine sample.  I have advised him to continue flomax and finasteride for symptoms, keep f/u appointment at [facility], he is scheduled for Dec 16th.    Diabetes ([place]) Check A1c today  Orders Placed This Encounter  Procedures  . POCT urinalysis dipstick  . POCT HgB A1C (CPT 83036)   Lovenia Kim, MD [place]

## 2018-09-19 NOTE — Patient Instructions (Signed)
You were seen today for low back pain and difficulty urinating.  We have checked your urine for infection and I will call in an antibiotic to your pharmacy if an infection is found.  For your difficulty urinating, we discussed that this is a symptom of your enlarged prostate.  I would recommend keeping your follow-up appointment with [facility] on December 16.  In the meantime, please continue taking your Flomax and finasteride.

## 2018-09-23 ENCOUNTER — Other Ambulatory Visit: Payer: Self-pay | Admitting: Family Medicine

## 2018-09-23 DIAGNOSIS — F411 Generalized anxiety disorder: Secondary | ICD-10-CM

## 2018-09-23 MED ORDER — ALPRAZOLAM 0.5 MG PO TABS: ORAL_TABLET | ORAL | 0 refills | Status: DC

## 2018-09-23 NOTE — Telephone Encounter (Signed)
Please have patient follow up for anxiety   Omar More, DO, PGY-2 [facility] 09/23/2018 1:32 PM

## 2018-09-23 NOTE — Telephone Encounter (Signed)
Per [place] database last refill 07/15/18

## 2018-09-23 NOTE — Assessment & Plan Note (Signed)
Check A1c today.

## 2018-09-23 NOTE — Progress Notes (Signed)
RX accidentally printed rather then sent electronically. Printed RX shredded and electronic #30 pills sent for xanax  Caroline More, DO, PGY-2 [facility] 09/23/2018 1:34 PM

## 2018-09-23 NOTE — Assessment & Plan Note (Signed)
Reports difficulty with urination over several months- follows with Urology at [facility].  UA performed at [facility] today which showed no evidence of infection, negative nitrite and no leuks. Less likely pyelo given no CVA tenderness, fever or chills.  Do not suspect acute urinary retention given no suprapubic tenderness or fullness on exam, able to void to give urine sample.  I have advised him to continue flomax and finasteride for symptoms, keep f/u appointment at [facility], he is scheduled for Dec 16th.

## 2018-09-23 NOTE — Telephone Encounter (Signed)
Pt needs to have his alprazolam refilled to the [facility] on [address]. He said he needs as soon as possible because he is completely out of his medication.

## 2018-10-08 NOTE — Telephone Encounter (Signed)
Attempted to call pt using vietnamese interpretor pam [phone]. She wasn't able to leave a message because voicemail wasn't set up. Christiaan Strebeck Kennon Holter, CMA

## 2018-10-14 ENCOUNTER — Other Ambulatory Visit: Payer: Self-pay | Admitting: Family Medicine

## 2018-10-14 DIAGNOSIS — R3915 Urgency of urination: Secondary | ICD-10-CM | POA: Diagnosis not present

## 2018-10-14 MED ORDER — FLUTICASONE PROPIONATE 50 MCG/ACT NA SUSP: 2.0000 | Freq: Every day | NASAL | 6 refills | Status: DC

## 2018-10-14 NOTE — Telephone Encounter (Signed)
Pt would like to have his Flonase refilled.

## 2018-10-16 ENCOUNTER — Other Ambulatory Visit: Payer: Self-pay

## 2018-10-16 NOTE — Patient Outreach (Signed)
[facility]) Care Management  10/16/2018  Omar Sanders 02-14-1941 [id number]   Medication Adherence call to Omar Sanders left a message for patient to call back patient is due on Metformin 500 mg .[place] said patient last pick up was in May/2019. Omar Sanders is showing past due under [place].   [place] Management Direct Dial [phone]  Fax [phone] [email or web address]

## 2018-10-31 ENCOUNTER — Ambulatory Visit: Payer: Medicare Other

## 2018-10-31 NOTE — Progress Notes (Deleted)
   Subjective   Patient ID: VEER ELAMIN    DOB: April 03, 1941, 78 y.o. male   MRN: [id number]  CC: "***"  HPI: Ona CODEN FRANCHI is a 78 y.o. male who presents to clinic today for the following:  ***: ***  ROS: see HPI for pertinent.  Freeburn: Reviewed. Smoking status reviewed. Medications reviewed.  Objective   There were no vitals taken for this visit. Vitals and nursing note reviewed.  General: well nourished, well developed, NAD with non-toxic appearance HEENT: normocephalic, atraumatic, moist mucous membranes Neck: supple, non-tender without lymphadenopathy Cardiovascular: regular rate and rhythm without murmurs, rubs, or gallops Lungs: clear to auscultation bilaterally with normal work of breathing Abdomen: soft, non-tender, non-distended, normoactive bowel sounds Skin: warm, dry, no rashes or lesions, cap refill < 2 seconds Extremities: warm and well perfused, normal tone, no edema  Assessment & Plan   No problem-specific Assessment & Plan notes found for this encounter.  No orders of the defined types were placed in this encounter.  No orders of the defined types were placed in this encounter.   Harriet Butte, [place], PGY-3 10/31/2018, 1:41 PM

## 2018-11-04 ENCOUNTER — Other Ambulatory Visit: Payer: Self-pay | Admitting: *Deleted

## 2018-11-04 DIAGNOSIS — J302 Other seasonal allergic rhinitis: Secondary | ICD-10-CM

## 2018-11-05 MED ORDER — OLOPATADINE HCL 0.2 % OP SOLN: 1.0000 [drp] | Freq: Every day | OPHTHALMIC | 5 refills | Status: DC

## 2018-11-28 ENCOUNTER — Encounter: Payer: Self-pay | Admitting: Family Medicine

## 2018-11-28 ENCOUNTER — Ambulatory Visit (INDEPENDENT_AMBULATORY_CARE_PROVIDER_SITE_OTHER): Payer: Medicare Other | Admitting: Family Medicine

## 2018-11-28 ENCOUNTER — Other Ambulatory Visit: Payer: Self-pay

## 2018-11-28 VITALS — BP 110/65 | HR 69 | Temp 97.6°F | Wt 180.0 lb

## 2018-11-28 DIAGNOSIS — R3915 Urgency of urination: Secondary | ICD-10-CM | POA: Diagnosis not present

## 2018-11-28 DIAGNOSIS — K581 Irritable bowel syndrome with constipation: Secondary | ICD-10-CM

## 2018-11-28 DIAGNOSIS — R21 Rash and other nonspecific skin eruption: Secondary | ICD-10-CM | POA: Diagnosis not present

## 2018-11-28 DIAGNOSIS — K5900 Constipation, unspecified: Secondary | ICD-10-CM

## 2018-11-28 MED ORDER — CETIRIZINE HCL 10 MG PO TABS: 10.0000 mg | ORAL_TABLET | Freq: Every day | ORAL | 1 refills | Status: DC

## 2018-11-28 MED ORDER — POLYETHYLENE GLYCOL 3350 17 GM/SCOOP PO POWD: 17.0000 g | Freq: Every day | ORAL | 1 refills | Status: DC

## 2018-11-28 MED ORDER — FAMOTIDINE 20 MG PO TABS: 20.0000 mg | ORAL_TABLET | Freq: Two times a day (BID) | ORAL | 1 refills | Status: DC

## 2018-11-28 NOTE — Progress Notes (Signed)
Subjective:    Patient ID: Omar Sanders, male    DOB: [birth year], 78 y.o.   MRN: [id number]   CC: Skin lesion itching and abdominal pain  HPI: Omar Sanders is a 78 year old gentleman presenting discuss the following:  Visit complicated by some language barrier.  Patient declined use of interpreter.   Rash pruritus: Patient reports an approximate a 1 year history of scattered skin lesions on his bilateral arms, back, and abdomen.  Itchy, not painful.  Intermittently itches, sometimes worse than others.  This is frustrating to the patient as this itchiness is uncomfortable and makes it hard for him to fall asleep, however states it doesn't seem worse at night just focuses on it more.  Has been seen for this in the past, trialed triamcinolone, hydrocortisone, and topical Benadryl with minimal relief of the itching.  Is never had biopsies.  States he gets better when he does not eat seafood. Denies any fever, change in bowel movements, association with bathing, weight loss, fatigue, palpitations.  Difficult to assess whether lesions are present before he starts itch, or lesions appear after he is itching so much.  Abdominal pain/constipation: Reports a few year history of lower and epigastric abdominal pains.  States he will often feel bloated, this is relieved after a bowel movement.  Bowel movements daily, however states they are often small pellets, Bristol stool chart type I. Has a history of irritable bowel syndrome with several anticonstipation medications that he is used in the past.  States he may have had some benefit with MiraLAX.  Denies any early satiety, nausea, vomiting, melena, hematochezia, change in appetite.  Eating and drinking well.  He does also endorse some burning sensation after he lays down with eating, will sometimes have a dry cough with this.  Has taken omeprazole and ranitidine in the past, felt that ranitidine was very helpful to him for this.  After additional chart review,  note that he has had this concern for quite some time and is tried several different therapies.  Was seen most recently for his abdominal concern/constipation back in September, has not been taking senna or MiraLAX as prescribed.    Smoking status reviewed  Review of Systems Per HPI, also denies recent illness, fever, headache, changes in vision, chest pain, shortness of breath, abdominal pain, N/V/D, weakness   Patient Active Problem List   Diagnosis Date Noted  . Venous stasis dermatitis of both lower extremities 06/11/2018  . Anxiety state 06/11/2018  . Rash and nonspecific skin eruption 04/22/2018  . Aortic aneurysm ([place]) 03/26/2018  . Chest pain 03/21/2018  . New onset right bundle branch block (RBBB)   . Diabetes ([place]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  . Vitamin D deficiency 07/16/2017  . Constipation 06/01/2017  . Hyperlipidemia 06/14/2016  . Atypical chest pain 06/14/2016  . Chronic kidney disease 01/19/2016  . BPH (benign prostatic hyperplasia) 05/06/2015  . Right leg swelling 02/01/2015  . Generalized anxiety disorder 12/29/2014  . Healthcare maintenance 10/01/2014  . Anemia 10/31/2012  . GERD 05/19/2010  . IBS (irritable bowel syndrome) 05/19/2010  . PERSONAL HX COLONIC POLYPS 05/19/2010  . Essential hypertension 04/10/2007  . Seasonal allergies 04/10/2007  . HIATAL HERNIA 04/10/2007     Objective:  BP 110/65   Pulse 69   Temp 97.6 F (36.4 C) (Oral)   Wt 180 lb (81.6 kg)   SpO2 97%   BMI 28.19 kg/m  Vitals and nursing note reviewed  General: NAD, pleasant, well-appearing Cardiac: RRR, normal heart sounds, no  murmurs Respiratory: CTAB, normal effort Abdomen: Soft abdomen, nondistended, normoactive bowel sounds, tender to palpation of his medial left lower quadrant and right lower quadrant.  No rebounding or guarding.  No hepatosplenomegaly noted. GU: Normal appearance of penis, scrotum.  No inguinal hernias palpated bilaterally. (Performed in the presence of  nurse) Extremities: no edema or cyanosis. WWP. Skin: warm and dry, skin lesions noted as below present on bilateral arms, intermittently on back and abdomen, scattered areas of erythematous papules, scabbing.  Did not note any additional excoriations.  No lesions interdigitally.  nontender to palpation.  Some dry areas of skin also noted throughout. Neuro: alert and oriented, no focal deficits Psych: normal affect Left upper arm as below:        Assessment & Plan:   Constipation Chronic. Reports daily bowel movements, however Bristol stool chart type I (hard, pellet-like), with associated abdominal cramping/bloating that is relieved after bowel movement.  No change in frequency or severity of associated abdominal pain that has been chronic.  Likely secondary to IBS, no alarm symptoms on exam or history, however could consider diabetic gastroparesis.  Previous colonoscopy in 2018, unremarkable with exception of few polyps (follow-up recommended to be 3-5 years at that time). Has tried several therapies in the past, however suspect language barrier contributing to noncompliance.  Patient currently not taking any therapies for constipation/abdominal pain, despite previous recommendations for MiraLAX and senna.  Discussed several times restarting MiraLAX therapy daily, also instructed to increase to twice daily if showing no improvement with this.  Encouraged staying well-hydrated and having a high-fiber diet. - MiraLAX daily, can increase to twice daily as needed - May also restart senna - Fluid hydration, high-fiber diet, monitor food triggers for abdominal bloating and cramping-consider FODMAP diet - Return precautions discussed, follow-up if symptoms not improving or worsening, recommend continue discussion at well visit in March with labs at that time  Rash and nonspecific skin eruption Quite pruritic papular rash present on bilateral arms, occasionally on abdomen and back.  Unclear etiology for  rash, could consider prurigo nodularis, Xerosis/dry skin, folliculitis, eczematous, scabies.  Has failed previous treatments with hydrocortisone/triamcinolone creams.  Will trial Zyrtec and Pepcid today for relief of pruritus.  Considered scabies given appearance of rash and pruritus not resolved with steroid or Benadryl creams, however with chronicity, distribution of rash, and no additional people in his household with rash- this is less likely.  However, could consider treatment for this if not responding to current trial.  Reassured that he has had his renal and liver function evaluated, which were both stable (kidney)/ normal (liver), in the time that he has had this pruritus.  Would like him to follow-up in our Derm clinic given his chronic history of this non-resolving rash.  - Zyrtec 10 mg daily (creatinine clearance above threshold for decreased dose) - Famotidine 20 mg twice daily - Encouraged use of good lotion on skin daily, examples included Eucerin or Cetaphil, lotion after bathing as well - Follow-up in Derm clinic, in addition to already scheduled appointment in March with PCP - Return precautions discussed    Darrelyn Hillock, DO Family Medicine Resident PGY-1

## 2018-11-28 NOTE — Patient Instructions (Signed)
It was so wonderful meeting you!  For your rash, since you have already tried so many different creams, we are going to try a antihistamine (Zyrtec) and an H2 blocker medication (Pepcid) to help with itching.  Please also make sure you use a good lotion on your skin daily, examples include Eucerin, Cetaphil, or Cerve cream.  For your abdominal pain, we will try some MiraLAX.  Please use this daily, if you have not seen improvement in your stools, may increase to twice daily.  I would like you to follow-up in the next 1-2 months so we can see how your abdominal pain is doing and how your skin is.  Lastly, please make an appointment to be seen in [place] clinic for further evaluation.  Please feel free to call the clinic if you have any questions or concerns.

## 2018-11-29 ENCOUNTER — Telehealth: Payer: Self-pay | Admitting: Family Medicine

## 2018-11-29 ENCOUNTER — Encounter: Payer: Self-pay | Admitting: Family Medicine

## 2018-11-29 NOTE — Telephone Encounter (Signed)
Patient seen by Dr. Higinio Plan. Will route to her.   Dalphine Handing, PGY-2 [facility] 11/29/2018 3:40 PM

## 2018-11-29 NOTE — Telephone Encounter (Signed)
Please let patient know he may buy "Polyethylene Glycol 3350" at his local pharmacy/grocery store. Recommend buying the generic brand (usually will be called ClearLax) instead of Miralax since it is cheaper and same effectiveness. He may start with this daily and then increase to twice daily as necessary. Thank you!   Patriciaann Clan, DO

## 2018-11-29 NOTE — Telephone Encounter (Signed)
Pt is calling and said that his insurance will not cover polyethylene he was prescribed yesterday. He would like to know if he can have another brand that his insurance will cover sent to his pharmacy.

## 2018-11-29 NOTE — Assessment & Plan Note (Addendum)
Chronic. Reports daily bowel movements, however Bristol stool chart type I (hard, pellet-like), with associated abdominal cramping/bloating that is relieved after bowel movement.  No change in frequency or severity of associated abdominal pain that has been chronic.  Likely secondary to IBS, no alarm symptoms on exam or history, however could consider diabetic gastroparesis.  Previous colonoscopy in 2018, unremarkable with exception of few polyps (follow-up recommended to be 3-5 years at that time). Has tried several therapies in the past, however suspect language barrier contributing to noncompliance.  Patient currently not taking any therapies for constipation/abdominal pain, despite previous recommendations for MiraLAX and senna.  Discussed several times restarting MiraLAX therapy daily, also instructed to increase to twice daily if showing no improvement with this.  Encouraged staying well-hydrated and having a high-fiber diet. - MiraLAX daily, can increase to twice daily as needed - May also restart senna - Fluid hydration, high-fiber diet, monitor food triggers for abdominal bloating and cramping-consider FODMAP diet - Return precautions discussed, follow-up if symptoms not improving or worsening, recommend continue discussion at well visit in March with labs at that time

## 2018-11-29 NOTE — Assessment & Plan Note (Addendum)
Quite pruritic papular rash present on bilateral arms, occasionally on abdomen and back.  Unclear etiology for rash, could consider prurigo nodularis, Xerosis/dry skin, folliculitis, eczematous, scabies.  Has failed previous treatments with hydrocortisone/triamcinolone creams.  Will trial Zyrtec and Pepcid today for relief of pruritus.  Considered scabies given appearance of rash and pruritus not resolved with steroid or Benadryl creams, however with chronicity, distribution of rash, and no additional people in his household with rash- this is less likely.  However, could consider treatment for this if not responding to current trial.  Reassured that he has had his renal and liver function evaluated, which were both stable (kidney)/ normal (liver), in the time that he has had this pruritus.  Would like him to follow-up in our Derm clinic given his chronic history of this non-resolving rash.  - Zyrtec 10 mg daily (creatinine clearance above threshold for decreased dose) - Famotidine 20 mg twice daily - Encouraged use of good lotion on skin daily, examples included Eucerin or Cetaphil, lotion after bathing as well - Follow-up in Derm clinic, in addition to already scheduled appointment in March with PCP - Return precautions discussed

## 2018-12-02 NOTE — Telephone Encounter (Signed)
Attempted to call patient using [employer] Lucianne Lei ID # [phone].  There was no answer and no voice mail set up.  Will try and call at a later time.  Ozella Almond, [place]

## 2018-12-04 NOTE — Telephone Encounter (Signed)
Attempted to call patient using [employer] ID# [phone]. No answer at home number listed or cell.  No voice mail and number disconnects by itself.  This is the second attempt to notify patient.  Ozella Almond, [place]

## 2019-01-02 ENCOUNTER — Ambulatory Visit (INDEPENDENT_AMBULATORY_CARE_PROVIDER_SITE_OTHER): Payer: Medicare Other | Admitting: Family Medicine

## 2019-01-02 ENCOUNTER — Other Ambulatory Visit: Payer: Self-pay

## 2019-01-02 DIAGNOSIS — R21 Rash and other nonspecific skin eruption: Secondary | ICD-10-CM

## 2019-01-02 MED ORDER — PREDNISONE 20 MG PO TABS: 20.0000 mg | ORAL_TABLET | Freq: Every day | ORAL | 0 refills | Status: AC

## 2019-01-02 MED ORDER — HALOBETASOL PROPIONATE 0.05 % EX CREA: TOPICAL_CREAM | Freq: Two times a day (BID) | CUTANEOUS | 0 refills | Status: DC

## 2019-01-02 NOTE — Patient Instructions (Signed)
It was a pleasure seeing you today.   Today we discussed your skin  For your skin: please mix together eucerin cream with steroid creams and apply to skin twice a day. Take your steroids for 5 days (prednisone 20mg ). Please use over the counter benadryl as needed for itching.   Please take a multivitamin as this may be related to a vitamin deficiency.   Please follow up in 2 weeks or sooner if symptoms persist or worsen. Please call the clinic immediately if you have any concerns.   Our clinic's number is [phone]. Please call with questions or concerns.   Thank you,  Caroline More, DO

## 2019-01-02 NOTE — Progress Notes (Signed)
   Subjective:    Patient ID: Omar Sanders, male    DOB: [birth year], 78 y.o.   MRN: [id number]   CC: Rash  HPI: Rash  Seen by Dr. Higinio Plan on 1/30 for pruritic papular rash on arms, abdomen, and back. Referred to dermatology clinic given chronic nature of rash.   Patient reports that rash has been present for 1 year. Started in May 2019. Reports that it began on his legs, making his legs black, and then spread thought entire body. Rash is very itchy to the point he can't sleep at night. Has tried triamcinolone ointment and hydrocortisone cream without relief. Has used "Go-Jo soap" which helps some. Does report bleeding, without itching. Lives alone. No co-workers or family members have rash. Denies exposure to insects. No exposure to plants or outdoors. No recent travel. No pets. No new products.    Objective:  BP 110/62   Pulse 64   Temp 97.6 F (36.4 C) (Oral)   Ht 5\' 7"  (1.702 m)   Wt 180 lb 12.8 oz (82 kg)   SpO2 97%   BMI 28.32 kg/m  Vitals and nursing note reviewed  General: well nourished, in no acute distress Skin: warm and dry, no rashes noted  Neuro: alert and oriented, no focal deficits   Assessment & Plan:    Rash and nonspecific skin eruption Unclear etiology at this time. Can consider xerosis vs. Vit A deficiency vs. Chronic dermatitis. Unlikely scabies given no other contacts and chronicity of disorder. Also sparing between digits. No recent travel. No new exposure. Given extensive itching and dryness will advise to mix steroid cream with eucerin and apply to skin bid. Also advised 5 day prednisone course given excessive irritation causing difficulty sleeping. Advised to use OTC benadryl PRN. Advised close follow up in 2 weeks to ensure improvement of condition.  Can consider w/u for autoimmune disorder or thyroid dysfunction if no improvement. Can also consider biopsy if no improvement.    Return in about 2 weeks (around 01/16/2019).   Caroline More, DO,  PGY-2

## 2019-01-02 NOTE — Assessment & Plan Note (Addendum)
Unclear etiology at this time. Can consider xerosis vs. Vit A deficiency vs. Chronic dermatitis. Unlikely scabies given no other contacts and chronicity of disorder. Also sparing between digits. No recent travel. No new exposure. Given extensive itching and dryness will advise to mix steroid cream with eucerin and apply to skin bid. Also advised 5 day prednisone course given excessive irritation causing difficulty sleeping. Advised to use OTC benadryl PRN. Advised close follow up in 2 weeks to ensure improvement of condition.  Can consider w/u for autoimmune disorder or thyroid dysfunction if no improvement. Can also consider biopsy if no improvement.

## 2019-01-16 ENCOUNTER — Telehealth: Payer: Self-pay | Admitting: Family Medicine

## 2019-01-16 NOTE — Telephone Encounter (Signed)
Received phone message for this patient with concerns of kidney disease. Hason ARIANA CAVENAUGH states the sxs are "kidneys are no goods".  She reports that he has no symptoms at this time but was told years ago by a doctor that his kidneys were not good.  Denies any dysuria, urinary odor change or color change.  No fevers.  States that he needs to come in immediately as his kidneys are failing.  I explained to patient that that this was likely not emergent and recommended that he may be schedule an appointment in late April to avoid spread of coronavirus.  Per chart review patient looks like he has already had a nephrology referral on 08/02/2018.  Patient is adamant that he wants an appointment as soon as possible.  Have made an appointment for early April per patient's request.  Discussed concern of viral spread, never patient states that he wants an appointment now.  Caroline More, DO  PGY-2

## 2019-01-24 ENCOUNTER — Encounter: Payer: Medicare Other | Admitting: Family Medicine

## 2019-02-03 ENCOUNTER — Other Ambulatory Visit: Payer: Self-pay

## 2019-02-03 DIAGNOSIS — I10 Essential (primary) hypertension: Secondary | ICD-10-CM

## 2019-02-03 MED ORDER — LISINOPRIL 5 MG PO TABS: 5.0000 mg | ORAL_TABLET | Freq: Every day | ORAL | 3 refills | Status: DC

## 2019-02-04 ENCOUNTER — Other Ambulatory Visit: Payer: Self-pay | Admitting: Family Medicine

## 2019-02-04 ENCOUNTER — Telehealth (INDEPENDENT_AMBULATORY_CARE_PROVIDER_SITE_OTHER): Payer: Medicare Other | Admitting: Family Medicine

## 2019-02-04 ENCOUNTER — Ambulatory Visit: Payer: Medicare Other | Admitting: Family Medicine

## 2019-02-04 ENCOUNTER — Other Ambulatory Visit: Payer: Self-pay

## 2019-02-04 ENCOUNTER — Telehealth: Payer: Medicare Other | Admitting: Family Medicine

## 2019-02-04 DIAGNOSIS — K219 Gastro-esophageal reflux disease without esophagitis: Secondary | ICD-10-CM | POA: Diagnosis not present

## 2019-02-04 DIAGNOSIS — N183 Chronic kidney disease, stage 3 unspecified: Secondary | ICD-10-CM

## 2019-02-04 DIAGNOSIS — R3912 Poor urinary stream: Secondary | ICD-10-CM

## 2019-02-04 DIAGNOSIS — I10 Essential (primary) hypertension: Secondary | ICD-10-CM

## 2019-02-04 DIAGNOSIS — N401 Enlarged prostate with lower urinary tract symptoms: Secondary | ICD-10-CM

## 2019-02-04 MED ORDER — FAMOTIDINE 20 MG PO TABS: 20.0000 mg | ORAL_TABLET | Freq: Two times a day (BID) | ORAL | 3 refills | Status: DC

## 2019-02-04 MED ORDER — LISINOPRIL 5 MG PO TABS: 5.0000 mg | ORAL_TABLET | Freq: Every day | ORAL | 3 refills | Status: DC

## 2019-02-04 MED ORDER — RANITIDINE HCL 300 MG PO TABS: 300.0000 mg | ORAL_TABLET | Freq: Every day | ORAL | 3 refills | Status: DC

## 2019-02-04 NOTE — Assessment & Plan Note (Signed)
Refilled lisinopril

## 2019-02-04 NOTE — Progress Notes (Signed)
Called patient who agreed to televisit  Difficult phone visit.  Language challenge plus anxiety plus multiple complaints:  Issues  Worried about kidneys:  As best I can tell he has two issues 1. CKD 3.  BP, DM and chol control have been good.  Will need visit but can wait 1-2 months. 2. BPH.  Apparently has bladder outlet problems which are worsened by constipation.  No acute problem.  No fever, chills.  Voiding issues no more difficult than usual  HBP: Good control.  Needs refill on lisinopril  GERD: Out of famotidine, now on recall    Constipation:  Insurance would not pay for daily miralax.  Now taking prn senna.  Duration of visit was 25 minutes.

## 2019-02-04 NOTE — Assessment & Plan Note (Signed)
Needs labs next

## 2019-02-04 NOTE — Progress Notes (Signed)
Duplicate encounter created in error.

## 2019-02-04 NOTE — Assessment & Plan Note (Signed)
Unclear if taking tamsulosin.  Bring in meds next visit.  See [place] in one month (when COVID is over.

## 2019-02-13 ENCOUNTER — Telehealth: Payer: Self-pay | Admitting: Family Medicine

## 2019-02-13 NOTE — Telephone Encounter (Signed)
Pt called for a refill on his predniSONE for itching, please call pt to clarify.

## 2019-02-14 ENCOUNTER — Telehealth (INDEPENDENT_AMBULATORY_CARE_PROVIDER_SITE_OTHER): Payer: Medicare Other | Admitting: Family Medicine

## 2019-02-14 ENCOUNTER — Other Ambulatory Visit: Payer: Self-pay

## 2019-02-14 DIAGNOSIS — R21 Rash and other nonspecific skin eruption: Secondary | ICD-10-CM | POA: Diagnosis not present

## 2019-02-14 MED ORDER — HALOBETASOL PROPIONATE 0.05 % EX CREA: TOPICAL_CREAM | Freq: Two times a day (BID) | CUTANEOUS | 1 refills | Status: DC

## 2019-02-14 NOTE — Assessment & Plan Note (Signed)
I discouraged him against prolonged use of oral steroid. I encouraged him to start using his Loratadine daily rather than as needed in addition to topical steroid. He will return soon to [facility] clinic for skin biopsy vs PCP referral to a dermatologist. He might also benefit from allergist referral. Med refill completed today. F?U as needed.

## 2019-02-14 NOTE — Telephone Encounter (Signed)
Patient had telemedicine visit where this was adressed on 4/17  Caroline More, DO, PGY-2 [facility] 02/14/2019 5:09 PM

## 2019-02-14 NOTE — Progress Notes (Signed)
[facility] Telemedicine Visit  Patient consented to have virtual visit. Method of visit:Telephone  Encounter participants: Patient: Omar Sanders - located at Home Provider: Andrena Mews - located [place] office Others (if applicable): [place] interpreter Claiborne Billings) ID# [phone]  Chief Complaint: Itchy skin and rash  HPI:  Itchy rash: He c/o chronic skin rash associated with itching. He uses topical steroid "Halobetasol" daily as well as oral Prednison. He is out of the oral steroid, which helps a lot for his itching, and he will like to get a refill. He was using Zyrtec previously, but because it was ineffective for his itching, his PCP switched to Loratidine. He uses Loratidine as needed and had not used it lately. He is requesting a refill of his Halobetasol as well. He denies other concerns. The communication was challenging even with an interpreter.  ROS: per HPI  Pertinent PMHx: Problem list reviewed  Exam:  Respiratory: No respiratory distress  Assessment/Plan:  Rash and nonspecific skin eruption I discouraged him against prolonged use of oral steroid. I encouraged him to start using his Loratadine daily rather than as needed in addition to topical steroid. He will return soon to [facility] clinic for skin biopsy vs PCP referral to a dermatologist. He might also benefit from allergist referral. Med refill completed today. F?U as needed.    Time spent during visit with patient: 29 minutes

## 2019-02-17 ENCOUNTER — Other Ambulatory Visit: Payer: Self-pay | Admitting: Family Medicine

## 2019-02-17 MED ORDER — LORATADINE 10 MG PO TABS: 10.0000 mg | ORAL_TABLET | Freq: Every day | ORAL | 11 refills | Status: DC

## 2019-02-17 NOTE — Telephone Encounter (Signed)
Pt called for refill on his loratadine. Please give pt a call back.

## 2019-02-20 ENCOUNTER — Telehealth: Payer: Self-pay

## 2019-02-20 NOTE — Telephone Encounter (Signed)
Scheduled for tomorrow at 3:15pm.

## 2019-02-20 NOTE — Telephone Encounter (Signed)
Pt called nurse line stating he is still itching. Pt stated "someone" was supposed to give me an alternative medication, something besides oral steroids, since this was discouraged during telemedicine visit. I see where Claritin and topical cream has been called in, unsure what he is referring to. Will route to provider who spoke with him.

## 2019-02-20 NOTE — Telephone Encounter (Signed)
I have escribed the medication discussed. Please help him schedule ATC visit for further management. Thanks.

## 2019-02-21 ENCOUNTER — Other Ambulatory Visit: Payer: Self-pay

## 2019-02-21 ENCOUNTER — Ambulatory Visit (INDEPENDENT_AMBULATORY_CARE_PROVIDER_SITE_OTHER): Payer: Medicare Other | Admitting: Family Medicine

## 2019-02-21 ENCOUNTER — Encounter: Payer: Self-pay | Admitting: Family Medicine

## 2019-02-21 VITALS — BP 122/72 | HR 65 | Temp 97.7°F | Ht 67.0 in | Wt 179.2 lb

## 2019-02-21 DIAGNOSIS — R21 Rash and other nonspecific skin eruption: Secondary | ICD-10-CM

## 2019-02-21 MED ORDER — KETOCONAZOLE 200 MG PO TABS: 300.0000 mg | ORAL_TABLET | ORAL | 0 refills | Status: AC

## 2019-02-21 NOTE — Patient Instructions (Signed)
I suspect that you may have a skin fungal infection.  I will give you 2 weeks of anti fungal treatment.  Return to clinic if you do not see any improvement after two weeks.    For the one bad spot on your forearm, apply Vaseline to soothe and keep area moist.

## 2019-02-22 ENCOUNTER — Encounter: Payer: Self-pay | Admitting: Family Medicine

## 2019-02-22 NOTE — Assessment & Plan Note (Addendum)
Is difficult to determine the etiology of this rash.  It does appear to be improved with oral steroids, suggesting the possibility of a rheumatological origin.  The dangers of long-term steroid use has been discussed with him previously and is well documented in previous notes.  This was again reiterated at the visit today.  The itching and hypopigmentation noted on exam today does appear consistent with possible tinea versicolor.  We will trial 2 weeks of PO ketoconazole to treat for tinea versicolor.  He was encouraged to return to clinic if he does not experience significant improvement in 2 weeks.  If the rash is not responsive to antifungals, it may be helpful to refer him to dermatology.

## 2019-02-22 NOTE — Progress Notes (Signed)
Subjective:  Omar Sanders is a 78 y.o. male who presents to the [facility] today with a chief complaint of chronic rash.   HPI:  Chronic rash [name] has discussed this rash multiple times with physicians in the past.  Most recently, he discussed this rash over a telephone encounter with Dr. Gwendlyn Deutscher on 4/17.  He recalls this conversation and was told that no medication or information was given to him at that time.  He reports that he has been experiencing this rash for about the past 6 months.  The rash is described as mildly red half centimeter lesions scattered over his arms, stomach, back.  He also notices a mild pink knot-like pattern under his forearm.  Some small mildly red dots appear to progress to open, raw wounds about 1/2 cm in diameter.  He has many small dots of white noted over his skin.  The rash started on his legs about 6 months ago and progressed up his body.  He no longer has any significant rash on his legs as it is primarily over his arms and trunk.  This rash causes him significant itching.  When he first discussed this rash with a doctor months ago, he was given oral steroids which she found helpful although it did not permanently take away his rash.  He is also tried topical triamcinolone which she does not find helpful.  Chief Complaint noted Review of Symptoms - see HPI PMH - Smoking status noted.    Objective:  Physical Exam: BP 122/72   Pulse 65   Temp 97.7 F (36.5 C) (Oral)   Ht 5\' 7"  (1.702 m)   Wt 179 lb 3.2 oz (81.3 kg)   SpO2 98%   BMI 28.07 kg/m    General: No acute distress, resting comfortably in his chair. Skin: 0.5 cm mildly erythematous macules scattered over forearms, chest, back.  one 0.75 cm erosion noted on right wrist without drainage.  Many 0.25 circles of hypopigmentation scattered over arms, chest, back.  Mildly erythematous base of erosion.  Very mild reticular pattern seen on the dorsum of his forearms.  Mild excoriations noted over wrists  bilaterally.  Media Information   Document Information   Photos    02/21/2019 15:54  Attached To:  Office Visit on 02/21/19 with Matilde Haymaker, MD  Source Information   Matilde Haymaker, MD  [facility]  Media Information   Document Information   Photos    02/21/2019 15:54  Attached To:  Office Visit on 02/21/19 with Matilde Haymaker, MD  Source Information   Matilde Haymaker, MD  [facility]      No results found for this or any previous visit (from the past 1 hour(s)).   Assessment/Plan:  Rash and nonspecific skin eruption Is difficult to determine the etiology of this rash.  It does appear to be improved with oral steroids, suggesting the possibility of a rheumatological origin.  The dangers of long-term steroid use has been discussed with him previously and is well documented in previous notes.  This was again reiterated at the visit today.  The itching and hypopigmentation noted on exam today does appear consistent with possible tinea versicolor.  We will trial 2 weeks of PO ketoconazole to treat for tinea versicolor.  He was encouraged to return to clinic if he does not experience significant improvement in 2 weeks.  If the rash is not responsive to antifungals, it may be helpful to refer him to dermatology.

## 2019-03-10 ENCOUNTER — Other Ambulatory Visit: Payer: Self-pay

## 2019-03-10 MED ORDER — LORATADINE 10 MG PO TABS: 10.0000 mg | ORAL_TABLET | Freq: Every day | ORAL | 11 refills | Status: DC

## 2019-03-11 ENCOUNTER — Other Ambulatory Visit: Payer: Self-pay

## 2019-03-11 MED ORDER — CETIRIZINE HCL 10 MG PO TABS: 10.0000 mg | ORAL_TABLET | Freq: Every day | ORAL | 11 refills | Status: DC

## 2019-03-25 ENCOUNTER — Telehealth: Payer: Self-pay | Admitting: Family Medicine

## 2019-03-25 NOTE — Telephone Encounter (Signed)
[employer] form dropped off for at front desk for completion.  Verified that patient section of form has been completed.  Last DOS/WCC with PCP was 02/21/19.  Placed form in team folder to be completed by clinical staff.    Richrd Humbles

## 2019-03-26 NOTE — Telephone Encounter (Signed)
Reviewed [employer] form dropped off by patient.  Completed clinical portion completed.  Form is to be faxed to [employer] at  [phone].  Patient has also requested that a copy be sent to his current home address.  Placed in PCP's box for completion.  Ozella Almond, [place]

## 2019-04-01 NOTE — Telephone Encounter (Signed)
Discussed form with Dr. Owens Shark. Given patient has significant difficulty with transportation and language it was determined that patient would be best cared for in immigrant clinic to have form completed. I discussed this with patient. He does not have transportation for this visit but will have a virtual visit. This was approved by Dr. Owens Shark. Form placed in Dr. Saul Fordyce inbox  Caroline More, DO, PGY-2 [facility] 04/01/2019 4:08 PM

## 2019-04-14 NOTE — Progress Notes (Signed)
Subjective:    Patient ID: Omar Sanders, male    DOB: 02/19/1941, 78 y.o.   MRN: [id number]  [place] interpreter used during this encounter  CC: kidneys, LE edema, shoulder pain, diabetes, HTN  HPI: Kidneys Referral to nephrology 07/2018. Creatinine 1.53 with GFR 43 from 07/29/2018, this seems stable from his previous labs. Reports he was not sure how to schedule an appointment so he never went. States he was diagnosed with "kidney disease" 4 years ago.   LE Edema Reports that edema began when he was diagnosed with renal disease 4 years ago. States that it does not improve or worsen, has been constant. States "when kidneys are better legs are better".   Shoulder pain  Reports he has had left shoulder pain x5-6 years. States that he has had difficulty exerising 2/2 pain. Reports he has tried an OTC cream and this helps. Reports that in 2015 he was given fentanyl patches which helped but he is scared to try them now because he feels they may too strong given his age. Reports pain is constant and every day. States that pain is equally bad in AM and PM.   Diabetes Fasting checks: does not check Post prandial does not check  Compliance: diet controlled Diet: red rice, more meat and vegetables. Worried about diabetes so improved his diet. Eats bananas as well   Exercise: limited due to pain in shoulder  Eye exam: due, will place referral  Foot exam: due, will perform today  A1C: 6.6 Symptoms: no symptoms of hypoglycemia. no symptoms of  polyuria, polydipsia. no numbness in extremities, and no foot ulcers/trauma Meds: diet controlled  Foot: no deformities, ulcerations or skin breakdown bilaterally  Hypertension: - Medications: lisinopril 5mg  (chosen by previous PCP for renal protection)  - Compliance: good  - Checking BP at home: does not check - Denies any SOB, CP, vision changes, LE edema, medication SEs, or symptoms of hypotension - Diet: see above - Exercise: see above    Hyperlipidemia Meds: atorvastatin 40mg   Diet: see above Exercise: see above  The 10-year ASCVD risk score Mikey Bussing DC Jr., et al., 2013) is: 54.4%   Values used to calculate the score:     Age: 30 years     Sex: Male     Is Non-Hispanic African American: No     Diabetic: Yes     Tobacco smoker: No     Systolic Blood Pressure: 845 mmHg     Is BP treated: Yes     HDL Cholesterol: 33 mg/dL     Total Cholesterol: 130 mg/dL   Objective:  BP 122/72   Pulse 78   SpO2 98%  Vitals and nursing note reviewed  General: well nourished, in no acute distress HEENT: normocephalic, PERRL, no scleral icterus or conjunctival pallor, no nasal discharge, moist mucous membranes, good dentition without erythema or discharge noted in posterior oropharynx Neck: supple, non-tender, without lymphadenopathy Cardiac: RRR, clear S1 and S2, no murmurs, rubs, or gallops Respiratory: clear to auscultation bilaterally, no increased work of breathing Abdomen: soft, nontender, nondistended, no masses or organomegaly. Bowel sounds present Extremities: 2+ pitting edema to shins bilaterally, no cyanosis. Warm, well perfused. 2+ radial and PT pulses bilaterally Shoulder: full ROM in RUE, limited ROM both passive and active in LUE (flexion, extension, abduction, adduction).  Skin: warm and dry, no rashes noted Neuro: alert and oriented, no focal deficits  Assessment & Plan:    Chronic kidney disease Patient with history of chronic kidney  disease recent labs showing GFR of 43 from September 2019.  Was previously referred to nephrology in October 2019 but patient states that he did not go to this appointment because he was not sure how to schedule this (I imagine this is related to his language barrier).  Spoke to nephrology office who informed me it is in the records that they called multiple times to schedule appointment and patient never called back.  I was able to schedule an appointment for patient on 6/24 @ 10:30AM.   Informed patient of this time and put in instructions on how to get to appointment and provided phone number for clinic so that patient may reschedule if this time does not work for him.  Patient was appreciative of this.  We will also obtain BMP today.  Leg edema Patient with lower extremity edema x4 years.  Per chart review has seen previous PCP for this who obtained Doppler ultrasound.  The Dopplers and they were negative.  Given chronicity of disorder low likelihood for DVT.  Patient also without pain to palpation.  No redness either.  Patient does report that this started when his renal disease worsened.  High likelihood that this is the cause of his edema.  Advised patient that he should follow-up with his nephrologist.  We will also obtain CMP to rule out worsening renal disease, liver disorder, or hypoalbuminemia.  Discussed this with Dr. Gwendlyn Deutscher who agrees with plan.  Patient should wear compression stockings and elevate legs to improve swelling.  Follow-up in 1 month if no improvement.  Does have a virtual visit in 1 week so can discuss this further then as well.  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  Range of motion is limited secondary to pain in both active and passive range of motion.  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  Agree that patient should not have strong opioids for this.  Advised to use Tylenol as needed.  Will refer patient to sports medicine for further follow-up.  Advised daily exercises.  Diabetes ([place]) Currently diet controlled.  A1c is still within diet-controlled levels.  Advised healthy eating and daily exercise.  Referral to ophthalmology placed.  Foot exam done today.  Follow-up in 3 months.  Essential hypertension Well-controlled on lisinopril 5 mg.  We will continue this.  Follow-up in 3 months  Hyperlipidemia Elevated ASCVD risk.  Patient is on atorvastatin 40 mg.  Will obtain  lipid panel.  Follow-up in 3 months.   Return in about 1 week (around 04/22/2019) for Virtual visit.   Caroline More, DO, PGY-2

## 2019-04-15 ENCOUNTER — Other Ambulatory Visit: Payer: Self-pay

## 2019-04-15 ENCOUNTER — Ambulatory Visit (INDEPENDENT_AMBULATORY_CARE_PROVIDER_SITE_OTHER): Payer: Medicare Other | Admitting: Family Medicine

## 2019-04-15 VITALS — BP 122/72 | HR 78

## 2019-04-15 DIAGNOSIS — N183 Chronic kidney disease, stage 3 unspecified: Secondary | ICD-10-CM

## 2019-04-15 DIAGNOSIS — E119 Type 2 diabetes mellitus without complications: Secondary | ICD-10-CM | POA: Diagnosis not present

## 2019-04-15 DIAGNOSIS — I10 Essential (primary) hypertension: Secondary | ICD-10-CM

## 2019-04-15 DIAGNOSIS — R600 Localized edema: Secondary | ICD-10-CM | POA: Diagnosis not present

## 2019-04-15 DIAGNOSIS — M7502 Adhesive capsulitis of left shoulder: Secondary | ICD-10-CM

## 2019-04-15 DIAGNOSIS — E785 Hyperlipidemia, unspecified: Secondary | ICD-10-CM

## 2019-04-15 DIAGNOSIS — E1122 Type 2 diabetes mellitus with diabetic chronic kidney disease: Secondary | ICD-10-CM

## 2019-04-15 DIAGNOSIS — E1169 Type 2 diabetes mellitus with other specified complication: Secondary | ICD-10-CM

## 2019-04-15 LAB — POCT GLYCOSYLATED HEMOGLOBIN (HGB A1C): HbA1c, POC (controlled diabetic range): 6.6 % (ref 0.0–7.0)

## 2019-04-15 NOTE — Patient Instructions (Addendum)
It was a pleasure seeing you today.   For your kidneys and swelling: please see your nephrologist. Your appointment is scheduled for 6/24 @ 10:30AM ([address]), bring your insurance card, ID, and current prescriptions-bring bottles. You must wear a mask. If you need to cancel or re-schedule call in 24 hr notice. [phone] is the phone number.   For your shoulder: I have referred you to sports medicine. Please try and move it as much as possible. Please use tylenol as needed.   For your diabetes: continue to eat healthy and exercise daily. Even walks count as exercise.   For you hypertension: no changes were made.   Please follow up in 1 week or sooner if symptoms persist or worsen. Please call the clinic immediately if you have any concerns.   Our clinic's number is [phone]. Please call with questions or concerns.   Please go to the emergency room if you have shortness of breath or chest pain.   Thank you,  Caroline More, DO

## 2019-04-16 DIAGNOSIS — M7500 Adhesive capsulitis of unspecified shoulder: Secondary | ICD-10-CM | POA: Insufficient documentation

## 2019-04-16 LAB — CBC
Hematocrit: 38.1 % (ref 37.5–51.0)
Hemoglobin: 12.4 g/dL — ABNORMAL LOW (ref 13.0–17.7)
MCH: 26.7 pg (ref 26.6–33.0)
MCHC: 32.5 g/dL (ref 31.5–35.7)
MCV: 82 fL (ref 79–97)
Platelets: 181 10*3/uL (ref 150–450)
RBC: 4.65 x10E6/uL (ref 4.14–5.80)
RDW: 14.1 % (ref 11.6–15.4)
WBC: 5.2 10*3/uL (ref 3.4–10.8)

## 2019-04-16 LAB — COMPREHENSIVE METABOLIC PANEL
ALT: 22 IU/L (ref 0–44)
AST: 21 IU/L (ref 0–40)
Albumin/Globulin Ratio: 1.5 (ref 1.2–2.2)
Albumin: 4.4 g/dL (ref 3.7–4.7)
Alkaline Phosphatase: 61 IU/L (ref 39–117)
BUN/Creatinine Ratio: 13 (ref 10–24)
BUN: 20 mg/dL (ref 8–27)
Bilirubin Total: 0.4 mg/dL (ref 0.0–1.2)
CO2: 22 mmol/L (ref 20–29)
Calcium: 9.1 mg/dL (ref 8.6–10.2)
Chloride: 104 mmol/L (ref 96–106)
Creatinine, Ser: 1.5 mg/dL — ABNORMAL HIGH (ref 0.76–1.27)
GFR calc Af Amer: 50 mL/min/{1.73_m2} — ABNORMAL LOW (ref >59)
GFR calc non Af Amer: 44 mL/min/{1.73_m2} — ABNORMAL LOW (ref >59)
Globulin, Total: 3 g/dL (ref 1.5–4.5)
Glucose: 80 mg/dL (ref 65–99)
Potassium: 4.5 mmol/L (ref 3.5–5.2)
Sodium: 141 mmol/L (ref 134–144)
Total Protein: 7.4 g/dL (ref 6.0–8.5)

## 2019-04-16 LAB — LIPID PANEL
Chol/HDL Ratio: 3.9 ratio (ref 0.0–5.0)
Cholesterol, Total: 130 mg/dL (ref 100–199)
HDL: 33 mg/dL — ABNORMAL LOW (ref >39)
LDL Calculated: 68 mg/dL (ref 0–99)
Triglycerides: 147 mg/dL (ref 0–149)
VLDL Cholesterol Cal: 29 mg/dL (ref 5–40)

## 2019-04-16 NOTE — Assessment & Plan Note (Signed)
Patient with history of chronic kidney disease recent labs showing GFR of 43 from September 2019.  Was previously referred to nephrology in October 2019 but patient states that he did not go to this appointment because he was not sure how to schedule this (I imagine this is related to his language barrier).  Spoke to nephrology office who informed me it is in the records that they called multiple times to schedule appointment and patient never called back.  I was able to schedule an appointment for patient on 6/24 @ 10:30AM.  Informed patient of this time and put in instructions on how to get to appointment and provided phone number for clinic so that patient may reschedule if this time does not work for him.  Patient was appreciative of this.  We will also obtain BMP today.

## 2019-04-16 NOTE — Assessment & Plan Note (Signed)
Patient with left shoulder pain x5 to 6 years.  Range of motion is limited secondary to pain in both active and passive range of motion.  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  Agree that patient should not have strong opioids for this.  Advised to use Tylenol as needed.  Will refer patient to sports medicine for further follow-up.  Advised daily exercises.

## 2019-04-16 NOTE — Assessment & Plan Note (Signed)
Well-controlled on lisinopril 5 mg.  We will continue this.  Follow-up in 3 months

## 2019-04-16 NOTE — Assessment & Plan Note (Signed)
Elevated ASCVD risk.  Patient is on atorvastatin 40 mg.  Will obtain lipid panel.  Follow-up in 3 months.

## 2019-04-16 NOTE — Assessment & Plan Note (Addendum)
Patient with lower extremity edema x4 years.  Per chart review has seen previous PCP for this who obtained Doppler ultrasound.  The Dopplers and they were negative.  Given chronicity of disorder low likelihood for DVT.  Patient also without pain to palpation.  No redness either.  Patient does report that this started when his renal disease worsened.  High likelihood that this is the cause of his edema.  Advised patient that he should follow-up with his nephrologist.  We will also obtain CMP to rule out worsening renal disease, liver disorder, or hypoalbuminemia.  Discussed this with Dr. Gwendlyn Deutscher who agrees with plan.  Patient should wear compression stockings and elevate legs to improve swelling.  Follow-up in 1 month if no improvement.  Does have a virtual visit in 1 week so can discuss this further then as well.

## 2019-04-16 NOTE — Assessment & Plan Note (Addendum)
Currently diet controlled.  A1c is still within diet-controlled levels.  Advised healthy eating and daily exercise.  Referral to ophthalmology placed.  Foot exam done today.  Follow-up in 3 months.

## 2019-04-21 ENCOUNTER — Telehealth: Payer: Self-pay | Admitting: Family Medicine

## 2019-04-21 NOTE — Telephone Encounter (Signed)
Attempted calling patient through [place] ID # [phone].  No answer and no voice mail at any of the numbers in chart and his daughter's cell phone was not answered either.  If patient should call back, per Dr. Tammi Klippel he needs to keep his appointment with Nephrology 04/23/19 as well as the one with [facility] on 04/22/19.  Ozella Almond, [place]

## 2019-04-21 NOTE — Telephone Encounter (Signed)
Pt is calling and is very confused about his upcoming appointment with [place] on 06/23 and his appointment with his nephrologist 06/24.   He said he couldn't come to our appointment due to transportation and I explained to him that we are setting up a taxi to pick him up from our appointment on 06/23.  He agreed and continued to ask about his appointment on 06/24 at his Nephrologist. I expalined he would have to call that office for assitance with that appointment.  He asked for Dr. Tammi Klippel to please call him and "help him with this"

## 2019-04-22 ENCOUNTER — Telehealth (INDEPENDENT_AMBULATORY_CARE_PROVIDER_SITE_OTHER): Payer: Medicare Other | Admitting: Family Medicine

## 2019-04-22 ENCOUNTER — Other Ambulatory Visit: Payer: Self-pay

## 2019-04-22 DIAGNOSIS — E119 Type 2 diabetes mellitus without complications: Secondary | ICD-10-CM

## 2019-04-22 NOTE — Progress Notes (Signed)
[facility] Telemedicine Visit  Patient consented to have virtual visit. Method of visit: Telephone The patient non-English speaking. An interpreter was used for the entire visit.   Encounter participants: Patient: Omar Sanders - located at home Provider: Martyn Malay - located at [facility] Others (if applicable): Interpreter   Chief Complaint: Rotation issues and concerns about getting to nephrologist  HPI:  Omar Sanders is a pleasant 78 year old gentleman with history significant for type 2 diabetes, chronic kidney disease, and hypertension presenting via telephone visit for follow-up with concerns about transportation.  The patient is a fairly independent gentleman.  He is able to read and write in [place] for basic tasks.  He is able to take the bus route.  He recently moved to a new apartment complex which is a 30-minute walk to the bus stop.  His children work every single day of the week and are unable to take him to appointments.  He does not drive due to poor vision.  He is able to walk and has no limitations in terms of his physical status currently.  He has been walking less due to [place].   ROS: per HPI  Pertinent PMHx:  Chronic kidney disease stage III Hypertension Benign prostatic hypertrophy  Exam:  Respiratory: Speaking full sentences and breathing comfortably  Assessment/Plan: Diagnoses and all orders for this visit:  Diabetes mellitus without complication ([place]), given the patient's multiple medical conditions and difficulty with transportation will reach out to care management to see if they can be of assistance in coordinating the patient's care specifically with nephrology.  Also consulted and spoke with Ms. Moore.  She will call the patient tomorrow to work with him on establishing Medicaid transportation.  He does not qualify for transportation through this Program as he has no significant physical, cognitive or medical disability requiring  this type of transportation.  His family members are unable to take him to appointments as they live some distance away and work during the weekdays.  The patient lives a considerable distance from the closest bus stop.  He has been sent a letter from his [employer] plan that has indicated he qualifies for this transportation.  I discussed this follow-up plan with the patient.  Ms. Laurance Flatten will reach out to the patient tomorrow the patient will then call to reschedule his nephrology appointment.  He will then work with social work and Medicaid to schedule a cab for this appointment.  Time spent during visit with patient: 30 minutes  Dorris Singh, MD  [facility] Medicine Teaching Service

## 2019-04-23 ENCOUNTER — Encounter: Payer: Self-pay | Admitting: Licensed Clinical Social Worker

## 2019-04-23 ENCOUNTER — Ambulatory Visit (INDEPENDENT_AMBULATORY_CARE_PROVIDER_SITE_OTHER): Payer: Medicare Other | Admitting: Licensed Clinical Social Worker

## 2019-04-23 DIAGNOSIS — Z139 Encounter for screening, unspecified: Secondary | ICD-10-CM

## 2019-04-23 NOTE — BH Specialist Note (Signed)
Care Coordination  Telephone Outreach Note 04/23/2019  Type of Service: General Social Work Consult via phone Start time: 2:15   End time: 3:30 Total time: 45 minutes Interpretor:Yes.   ; Name: Tyler Deis #[id number] and Language: [place] Name: Omar Sanders MRN: [id number] DOB: 06-25-1941  Omar Sanders is a 78 y.o. male referred by Dr. Owens Shark for concerns with transportation to his medical appointments.  Patient no longer qualifies for [place]AT, too far to walk to bus stop and does not have transportation to medical appointments.   LCSW called DSS to arrange for [facility] Transportation assessment.  Assessment will be completed today by Medicaid worker.   LCSW called patient to explain Medicaid transportation and that the assessment would be completed today. Patient also expressed concerns of not always having transportation to pick up his medication. He is open to possible Mail order but will need assistance getting it set up. Patient's children are supportive, however they work and are not always able to transport him to appointments .  Issues discussed: support system, transportation options, concerns about medication delivery and how patient has managed in the past.  INTERVENTION: . Review of patient's consultants notes from appropriate care team members was performed as part of care coordination referral. This also includes locating resources and coordinating care for patient.  Provided patient with information and phone number about Medicaid Transportation and how to schedule appointment ; also utilized engagement as part of my intervention.  SDOH (Social Determinants of Health) screening also performed today.  challenges identified:  Transportation. Interventions for challenges: was to assist with setting up transportation for medical appointments. Plan:    1. Patient acknowledged understanding and will Call DSS at [phone] when he has scheduled medical appointments (patient is able to speak a English  enough to make the call or get children to assist) 2. LCSW will also forward message to [facility] RN care manager to see if she is able to assist with setting up mail order medication.  Caroline More, DO and Dr. Owens Shark have been notified of this outreach and Omar Sanders's plan.   [name], LCSW [facility]   [phone] 4:00 PM

## 2019-04-30 ENCOUNTER — Other Ambulatory Visit: Payer: Self-pay

## 2019-04-30 NOTE — Patient Outreach (Signed)
[place] [facility]) Care Management  04/30/2019  Omar Sanders [birth year] [id number]   TELEPHONE SCREENING Referral date: 04/23/19 Referral source: social worker/  primary MD Referral reason: Insurance: United health care Attempt #1  Telephone call to patient regarding social worker referral using [place] interpreter [phone].  Unable to reach patient. [place] interpreter [phone] left HIPAA compliant voice message with call back phone number to [facility].   PLAN: RNCm will attempt 2nd telephone call to patient within 4 business days.  RNCm will send outreach letter to attempt contact   Quinn Plowman RN,BSN,CCM [facility] Telephonic  [phone]

## 2019-05-05 ENCOUNTER — Telehealth: Payer: Self-pay | Admitting: Family Medicine

## 2019-05-05 DIAGNOSIS — I10 Essential (primary) hypertension: Secondary | ICD-10-CM

## 2019-05-05 MED ORDER — LISINOPRIL 5 MG PO TABS: 5.0000 mg | ORAL_TABLET | Freq: Every day | ORAL | 3 refills | Status: DC

## 2019-05-05 NOTE — Telephone Encounter (Signed)
Pt would like to have his Lisinopril refilled.

## 2019-05-06 ENCOUNTER — Other Ambulatory Visit: Payer: Self-pay | Admitting: Family Medicine

## 2019-05-06 MED ORDER — BENAZEPRIL HCL 5 MG PO TABS: 5.0000 mg | ORAL_TABLET | Freq: Every day | ORAL | 0 refills | Status: DC

## 2019-05-06 NOTE — Telephone Encounter (Signed)
Please inform patient that I have ordered benazapril 5mg  for 1 month since lisinopril is on backorder. After 1 month patient can return to taking his lisinopril   Caroline More, DO, PGY-3 [facility] 05/06/2019 3:13 PM

## 2019-05-06 NOTE — Progress Notes (Signed)
Lisinopril on backorder. Will give 1 month of benazapril until lisinopril is available. Discussed with Dr. Valentina Lucks.   Caroline More, DO, PGY-3 [facility] 05/06/2019 3:13 PM

## 2019-05-06 NOTE — Telephone Encounter (Signed)
Received fax from pharmacy stating lisinopril 5mg  is currently on backorder until August. Can a 1 month alternative be sent in? Please advise.

## 2019-05-06 NOTE — Telephone Encounter (Signed)
Pt informed. Jalani Rominger, CMA  

## 2019-05-07 ENCOUNTER — Other Ambulatory Visit: Payer: Self-pay

## 2019-05-07 NOTE — Telephone Encounter (Signed)
This encounter was created in error - please disregard.

## 2019-05-07 NOTE — Patient Outreach (Signed)
[place] [facility]) Care Management  05/07/2019  NYRON MOZER [birth year] [id number]  TELEPHONE SCREENING Referral date: 04/23/19 Referral source: social worker/  primary MD Referral reason: Insurance: United health care Attempt #2  Telephone call to patient's home number using [place] interpreter, ID# [phone].  Attempted call to patients home number.  Unable to reach. HIPAA compliant message left.  Attempted call to patients daughter and designated party release, Cil Siah.  Unable to reach or leave voice message.  Interpreter states unable to leave voice message due to phone ringing busy.   PLAN; RNCM will attempt 3rd telephone call to patient within 4 business days.   Quinn Plowman RN,BSN,CCM [facility] Telephonic  [phone]

## 2019-05-08 NOTE — Telephone Encounter (Signed)
Pt would like for Dr. Tammi Klippel to call him concerning the benazapril. Pt states he read on the prescription that he should not be taking this medication due to issues he has with his kidneys.   Please call pt to discuss.

## 2019-05-09 NOTE — Telephone Encounter (Signed)
Attempted to call patient using [place] interpretor. No answer. Left VM on home phone informing patient that both medications are ACEIs therefore have similar risk to kidneys. Informed him that if he has further questions he could either follow up in clinic or contact his nephrologist (re-provided number) as he should have had a recent appointment to establish care (this was made during his last appointment with me).   Dalphine Handing, PGY-3 [facility] 05/09/2019 12:28 PM

## 2019-05-12 ENCOUNTER — Other Ambulatory Visit: Payer: Self-pay

## 2019-05-12 NOTE — Patient Outreach (Signed)
[facility]) Care Management  05/12/2019  Omar Sanders Jul 22, 1941 [id number]  TELEPHONE SCREENING Referral date:04/23/19 Referral source:social worker/ primary MD Referral reason: Insurance:[employer] Attempt #3  Telephone call to patient regarding referral. Unable to reach. HIPAA compliant voice message left with call back phone number.   PLAN; If no return call will proceed with closure.   Quinn Plowman RN,BSN,CCM [facility] Telephonic  [phone]

## 2019-05-15 ENCOUNTER — Other Ambulatory Visit: Payer: Self-pay

## 2019-05-15 NOTE — Patient Outreach (Signed)
[facility]) Care Management  05/15/2019  Omar Sanders Apr 22, 1941 [id number]  TELEPHONE SCREENING Referral date:04/23/19 Referral source:social worker/ primary MD Referral reason: Insurance:[employer] care  Case Closure:  No response after 3 telephone calls and outreach letter attempt.  PLAN: RNCM will close patient due to being unable to reach.  RNCM will send closure notification to patient's primary MD   Quinn Plowman RN,BSN,CCM [facility] Telephonic  [phone]

## 2019-05-20 ENCOUNTER — Other Ambulatory Visit: Payer: Self-pay | Admitting: Family Medicine

## 2019-05-20 DIAGNOSIS — J302 Other seasonal allergic rhinitis: Secondary | ICD-10-CM

## 2019-05-20 MED ORDER — OLOPATADINE HCL 0.2 % OP SOLN: 1.0000 [drp] | Freq: Every day | OPHTHALMIC | 5 refills | Status: DC

## 2019-05-20 NOTE — Telephone Encounter (Signed)
Pt needs refill on his eye drops. Please advise

## 2019-06-06 NOTE — Progress Notes (Signed)
Subjective:    Patient ID: Omar Sanders, male    DOB: 1940-12-17, 78 y.o.   MRN: [id number]   CC: f/u shoulder pain and HTN  HPI: Hypertension: - Medications: lisinopril 5mg  (chosen by previous PCP for renal protection) - had to use benazepril for 1 month due to lisinopril on backorder by pharmacy - Compliance: good  - Checking BP at home: only checks PRN, at times of checks 150-160, low 127 or 128  - Denies any SOB, CP, vision changes, LE edema, medication SEs, or symptoms of hypotension. Has HA when BP is high  - Diet: good appetite, no changes in diet  - Exercise: no due to fear of COVID exposure outside   Hyperlipidemia Meds: atorvastatin 40mg   Diet: see above Exercise: see above  The 10-year ASCVD risk score Mikey Bussing DC Jr., et al., 2013) is: 72.2%   Values used to calculate the score:     Age: 89 years     Sex: Male     Is Non-Hispanic African American: No     Diabetic: Yes     Tobacco smoker: No     Systolic Blood Pressure: 950 mmHg     Is BP treated: Yes     HDL Cholesterol: 33 mg/dL     Total Cholesterol: 130 mg/dL  Shoulder pain Patient with continued shoulder pain.  States that Saturday was very severe, 10 out of 10 pain.  States now it is a 7-8 out of 10 due to using Tylenol for pain relief.  States it is mainly in his upper back, shoulders, arms.  Does also report some leg pain and torso pain and back pain.  Difficult to obtain history from patient.  States that he had a similar pain in 2015 and had a fentanyl patch.  Also had prescriptions for tizanidine, baclofen, nitroglycerin.  States that it comes and goes and is not daily.  States that he is not having new urinary changes, does see a nephrologist.  Denies any fever.  Denies any incontinence.  States that is been going on for 1 year and he thinks he may have some weakness.  Was instructed at visit of 6/16 to go to sports medicine for follow-up for possible frozen shoulder, patient did not go as he had trip faculty  with transportation and was not aware that they would call him to schedule an appointment.  Per chart review they had called patient a few times but voicemail was not set up to leave a message.   Objective:  BP (!) 160/72   Pulse 84   Temp 98.4 F (36.9 C) (Oral)   Wt 180 lb (81.6 kg)   SpO2 96%   BMI 28.19 kg/m  Vitals and nursing note reviewed  General: well nourished, in no acute distress HEENT: normocephalic, no scleral icterus or conjunctival pallor  Cardiac: RRR, clear S1 and S2, no murmurs, rubs, or gallops Respiratory: clear to auscultation bilaterally, no increased work of breathing Abdomen: soft, nontender, nondistended, no masses or organomegaly. Bowel sounds present Extremities: no edema or cyanosis. Warm, well perfused.   Shoulder: R shoulder with normal ROM. L shoulder restricted in both passive and active extension, flexion, abduction and adduction. Tenderness to palpation.  Skin: warm and dry, no rashes noted Neuro: alert and oriented, no focal deficits   Assessment & Plan:    Essential hypertension Patient with elevated blood pressure today.  Can consider secondary to pain.  Patient with normotensive blood pressures during all other visits.  Did have to recently changed to benazepril secondary to pharmacy having backorder on lisinopril.  Will change back to lisinopril 5 mg daily.  Advised to continue to check blood pressures at home and keep a log of these blood pressures.  Follow-up in 1 month to ensure that.  Blood pressure is well controlled.  If still high can consider increasing lisinopril at that time.  Patient is otherwise elderly and is not having severe symptoms or no neurologic changes so will not change medication at this time.  Hyperlipidemia Patient with elevated ASCVD risk.  Is on atorvastatin.  Continue atorvastatin.  Can consider increasing however given patient's current muscle pain will be difficult to assess if he is having myalgia or not.  Can  consider increasing after pain is improved with sports medicine referral.  Frozen shoulder syndrome Pain in left shoulder.  Restricted in motion in both passive and active in all directions.  Likely patient has frozen shoulder.  Have advised to use lidocaine patch for MSK pain of back.  Have advised that patient follow-up with sports medicine, may benefit from a steroid injection.  Discussed this with patient.  He became very tearful as he said he is having lots of difficulty getting to his doctor's appointments.  I have sent a message to Neoma Laming, [employer] representative, to help find resources for transportation for patient.  Advised to use Tylenol as needed as well as lidocaine patch in the interim.  Will try and avoid opioids as well as muscle relaxants as patient is 78 years old.  Strict return precautions given.  Follow-up in 1 month.    Return in about 4 weeks (around 07/07/2019).   Caroline More, DO, PGY-3      Subjective:    Patient ID: Omar Sanders, male    DOB: [birth year], 78 y.o.   MRN: [id number]   CC:  HPI:   Smoking status reviewed  Review of Systems   Objective:  BP (!) 160/72   Pulse 84   Temp 98.4 F (36.9 C) (Oral)   Wt 180 lb (81.6 kg)   SpO2 96%   BMI 28.19 kg/m  Vitals and nursing note reviewed  General: well nourished, in no acute distress HEENT: normocephalic, TM's visualized bilaterally, no scleral icterus or conjunctival pallor, no nasal discharge, moist mucous membranes, good dentition without erythema or discharge noted in posterior oropharynx Neck: supple, non-tender, without lymphadenopathy Cardiac: RRR, clear S1 and S2, no murmurs, rubs, or gallops Respiratory: clear to auscultation bilaterally, no increased work of breathing Abdomen: soft, nontender, nondistended, no masses or organomegaly. Bowel sounds present Extremities: no edema or cyanosis. Warm, well perfused. 2+ radial and PT pulses bilaterally Skin: warm and dry, no rashes noted  Neuro: alert and oriented, no focal deficits   Assessment & Plan:    Essential hypertension Patient with elevated blood pressure today.  Can consider secondary to pain.  Patient with normotensive blood pressures during all other visits.  Did have to recently changed to benazepril secondary to pharmacy having backorder on lisinopril.  Will change back to lisinopril 5 mg daily.  Advised to continue to check blood pressures at home and keep a log of these blood pressures.  Follow-up in 1 month to ensure that.  Blood pressure is well controlled.  If still high can consider increasing lisinopril at that time.  Patient is otherwise elderly and is not having severe symptoms or no neurologic changes so will not change medication at this time.  Hyperlipidemia Patient with  elevated ASCVD risk.  Is on atorvastatin.  Continue atorvastatin.  Can consider increasing however given patient's current muscle pain will be difficult to assess if he is having myalgia or not.  Can consider increasing after pain is improved with sports medicine referral.  Frozen shoulder syndrome Pain in left shoulder.  Restricted in motion in both passive and active in all directions.  Likely patient has frozen shoulder.  Have advised to use lidocaine patch for MSK pain of back.  Have advised that patient follow-up with sports medicine, may benefit from a steroid injection.  Discussed this with patient.  He became very tearful as he said he is having lots of difficulty getting to his doctor's appointments.  I have sent a message to Neoma Laming, [employer] representative, to help find resources for transportation for patient.  Advised to use Tylenol as needed as well as lidocaine patch in the interim.  Will try and avoid opioids as well as muscle relaxants as patient is 78 years old.  Strict return precautions given.  Follow-up in 1 month.   Discussed with Dr. Vanessa Kick  Return in about 4 weeks (around 07/07/2019).   Caroline More, DO, PGY-3

## 2019-06-09 ENCOUNTER — Ambulatory Visit (INDEPENDENT_AMBULATORY_CARE_PROVIDER_SITE_OTHER): Payer: Medicare Other | Admitting: Family Medicine

## 2019-06-09 ENCOUNTER — Other Ambulatory Visit: Payer: Self-pay

## 2019-06-09 ENCOUNTER — Encounter: Payer: Self-pay | Admitting: Family Medicine

## 2019-06-09 DIAGNOSIS — M7502 Adhesive capsulitis of left shoulder: Secondary | ICD-10-CM

## 2019-06-09 DIAGNOSIS — I10 Essential (primary) hypertension: Secondary | ICD-10-CM

## 2019-06-09 DIAGNOSIS — E785 Hyperlipidemia, unspecified: Secondary | ICD-10-CM

## 2019-06-09 MED ORDER — LISINOPRIL 5 MG PO TABS: 5.0000 mg | ORAL_TABLET | Freq: Every day | ORAL | 3 refills | Status: DC

## 2019-06-09 NOTE — Assessment & Plan Note (Signed)
Patient with elevated blood pressure today.  Can consider secondary to pain.  Patient with normotensive blood pressures during all other visits.  Did have to recently changed to benazepril secondary to pharmacy having backorder on lisinopril.  Will change back to lisinopril 5 mg daily.  Advised to continue to check blood pressures at home and keep a log of these blood pressures.  Follow-up in 1 month to ensure that.  Blood pressure is well controlled.  If still high can consider increasing lisinopril at that time.  Patient is otherwise elderly and is not having severe symptoms or no neurologic changes so will not change medication at this time.

## 2019-06-09 NOTE — Assessment & Plan Note (Signed)
Pain in left shoulder.  Restricted in motion in both passive and active in all directions.  Likely patient has frozen shoulder.  Have advised to use lidocaine patch for MSK pain of back.  Have advised that patient follow-up with sports medicine, may benefit from a steroid injection.  Discussed this with patient.  He became very tearful as he said he is having lots of difficulty getting to his doctor's appointments.  I have sent a message to Neoma Laming, [employer] representative, to help find resources for transportation for patient.  Advised to use Tylenol as needed as well as lidocaine patch in the interim.  Will try and avoid opioids as well as muscle relaxants as patient is 78 years old.  Strict return precautions given.  Follow-up in 1 month.

## 2019-06-09 NOTE — Assessment & Plan Note (Addendum)
Patient with elevated ASCVD risk.  Is on atorvastatin.  Continue atorvastatin.  Can consider increasing however given patient's current muscle pain will be difficult to assess if he is having myalgia or not.  Can consider increasing after pain is improved with sports medicine referral.

## 2019-06-09 NOTE — Patient Instructions (Signed)
It was a pleasure seeing you today.   Today we discussed your pain and blood pressure  For your blood pressure: continue lisinopril 5mg , check your blood pressure at home and write them down for your next visit  For your pain: Please use an over the counter lidocaine patch (Salonpas lidocaine patch). Use that on your back muscles.   [facility] [address], [place], [place], [place] [address] [phone]  Please follow up in 1 month or sooner if symptoms persist or worsen. Please call the clinic immediately if you have any concerns.   Our clinic's number is [phone]. Please call with questions or concerns.   Please go to the emergency room if you have chest pain or shortness of breath  Thank you,  Caroline More, DO

## 2019-06-10 ENCOUNTER — Telehealth: Payer: Self-pay | Admitting: Licensed Clinical Social Worker

## 2019-06-10 NOTE — Telephone Encounter (Signed)
   Unsuccessful Phone Outreach Note  06/10/2019 Name: NICOLAI LABONTE MRN: [id number] DOB: 03/20/1941  Referred by: Caroline More, DO,  Reason for referral : Care Coordination and Transportation Interpretor:Yes.   ; Name: Almyra Free # [phone] and Language: Haeden Hudock is a 78 y.o. year old male who sees Caroline More, DO for primary care.  LCSW received referral to assist patient with transportation concerns . Called patient to assess needs and barriers reference the above referral. Telephone outreach was unsuccessful. A HIPPA compliant phone message was left for the patient providing contact information and requesting a return call.  Follow Up Plan:  If no return call is received. LCSW will call again in 3 to 5 days. Dr. Tammi Klippel has been informed of this outreach and plan  [name], [place] / [place]   [phone] 10:44 AM

## 2019-06-13 ENCOUNTER — Other Ambulatory Visit: Payer: Self-pay | Admitting: Nephrology

## 2019-06-13 DIAGNOSIS — N183 Chronic kidney disease, stage 3 unspecified: Secondary | ICD-10-CM

## 2019-06-17 NOTE — Telephone Encounter (Addendum)
   Care Coordination Social Work Note  06/17/2019 Name: Omar Sanders MRN: [id number] DOB: 1940-11-12  Return call from patient. Reviewed transportation information. He Acknowledged understanding.  Plan: Patient will call DSS Medicaid Transportation for ongoing transportation needs. PCP has been informed of plan  [name], [place] / [place]   [phone] 2:41 PM

## 2019-06-17 NOTE — Telephone Encounter (Signed)
2nd Unsuccessful Phone Outreach Note  06/17/2019 Name: Omar Sanders MRN: [id number] DOB: 19-Jun-1941  Referred by: Caroline More, DO ,  Reason for referral : Care Coordination and Transportation  Interpretor:Yes.   ; Name: Omar Sanders , # [id number] and Language: [name] is a 78 y.o. year old male who sees Caroline More, DO for primary care. 2nd unsuccessful telephone outreach attempt to [name] today.  A HIPPA compliant phone message was left for the patient providing contact information and requesting a return call.  Also left contact information for Medicaid Transportation.   Plan: LCSW will discontinue outreach calls but will be available at any time to provide services to [name]  Update provided to PCP  [name], [place] Worker [place] / [place]   [phone] 10:14 AM

## 2019-06-19 IMAGING — CT CT ANGIO CHEST-ABD-PELV FOR DISSECTION W/ AND WO/W CM
2 of 8 series · 11 of 36 positions shown, 15 images · IV contrast (iopamidol)
Comparison: 07/30/2015

CLINICAL DATA: Chest pain since last night.

EXAM:
CT ANGIOGRAPHY CHEST, ABDOMEN AND PELVIS
TECHNIQUE: Multidetector CT imaging through the chest, abdomen and pelvis was
performed using the standard protocol during bolus administration of
intravenous contrast. Multiplanar reconstructed images and MIPs were
obtained and reviewed to evaluate the vascular anatomy.
CONTRAST:  80mL [id number] IOPAMIDOL ([id number]-O76) INJECTION 76%

[Series 6: dissection 3.0 i30f 3 · axial · 0.85mm/px · z∈[+827,+1391]mm · 10 of 223 slices shown, 13 images]
[im 23/223  mediastinal]
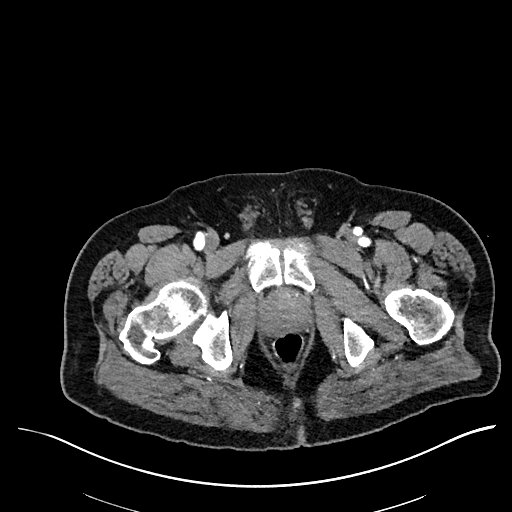
[im 23/223  bone]
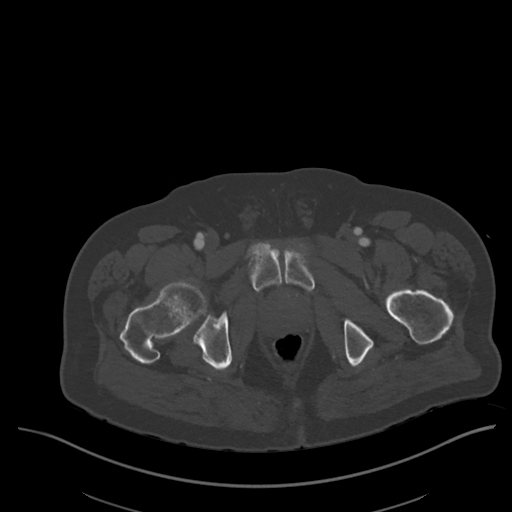
[im 45/223  mediastinal]
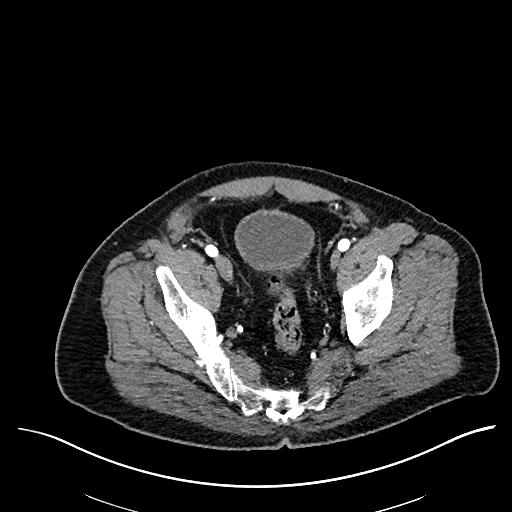
[im 67/223  mediastinal]
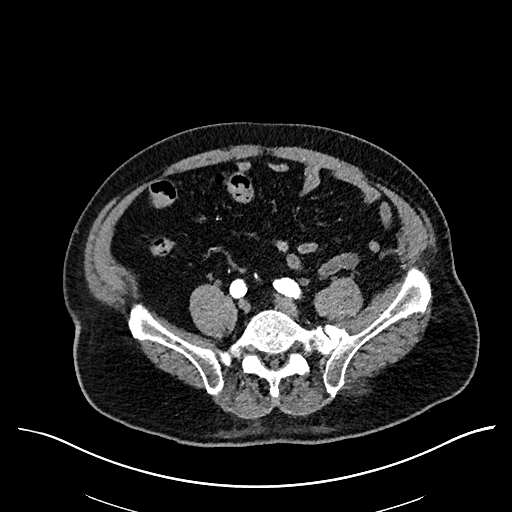
[im 100/223  mediastinal]
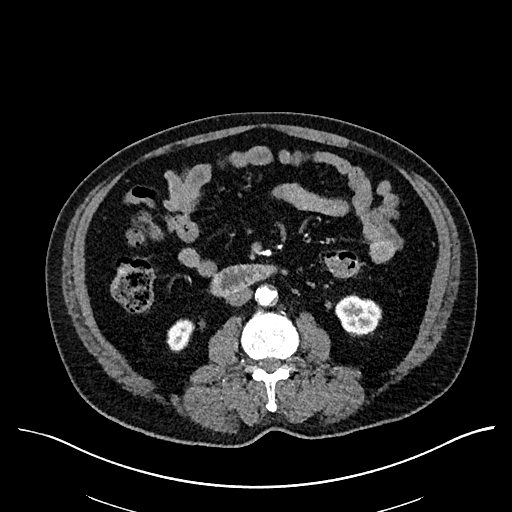
[im 123/223  mediastinal]
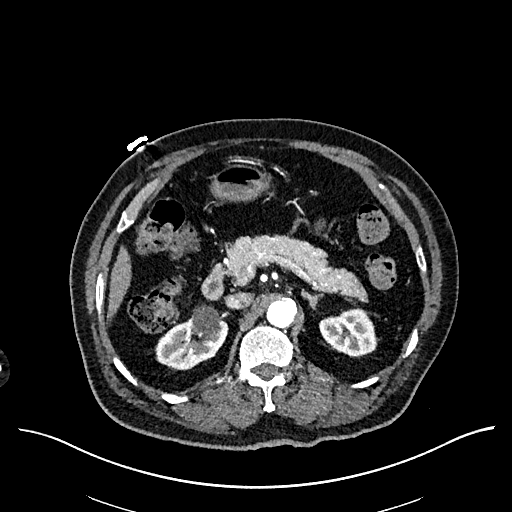
[im 156/223  mediastinal]
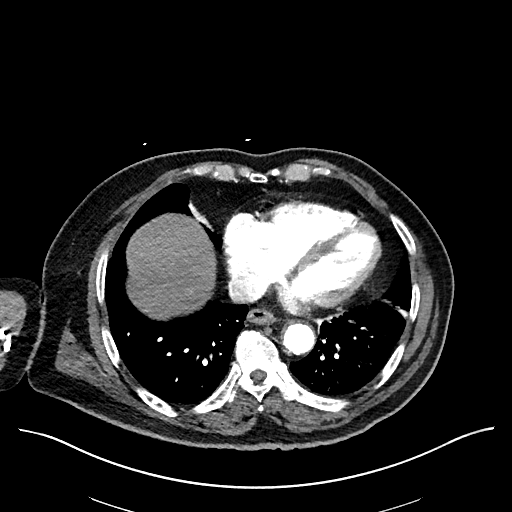
[im 178/223  mediastinal]
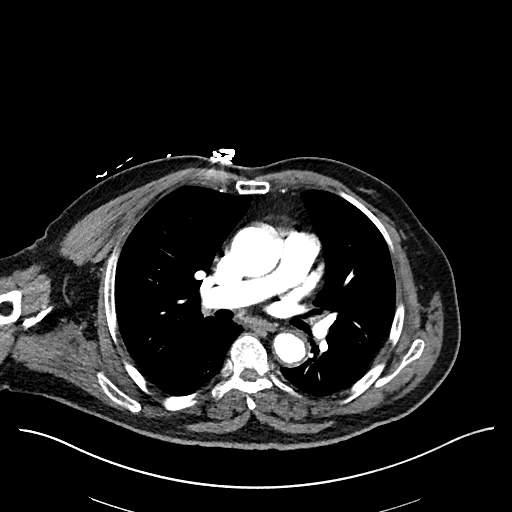
[im 178/223  lung]
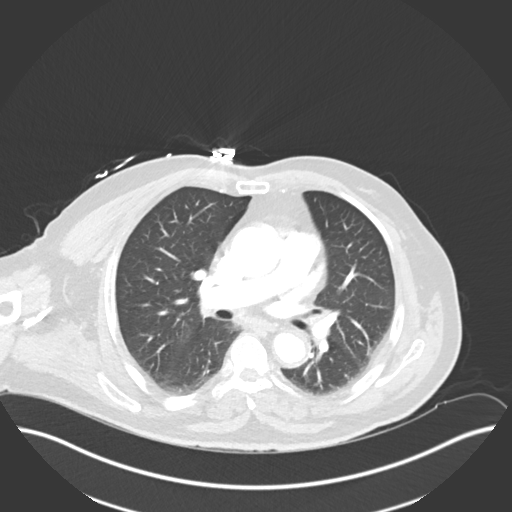
[im 189/223  lung]
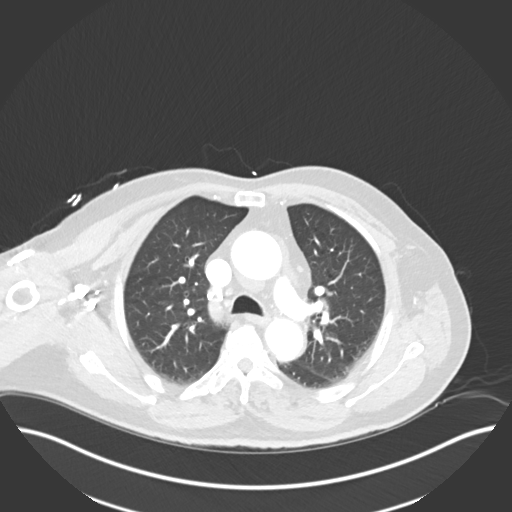
[im 200/223  mediastinal]
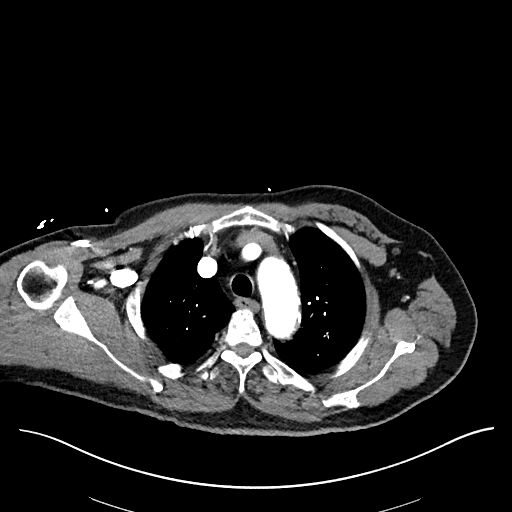
[im 200/223  lung]
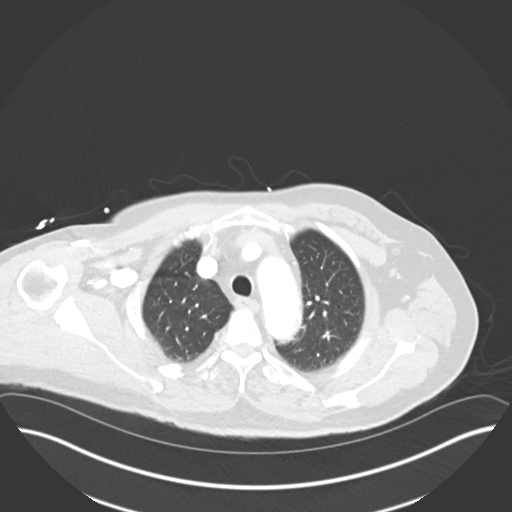
[im 211/223  lung]
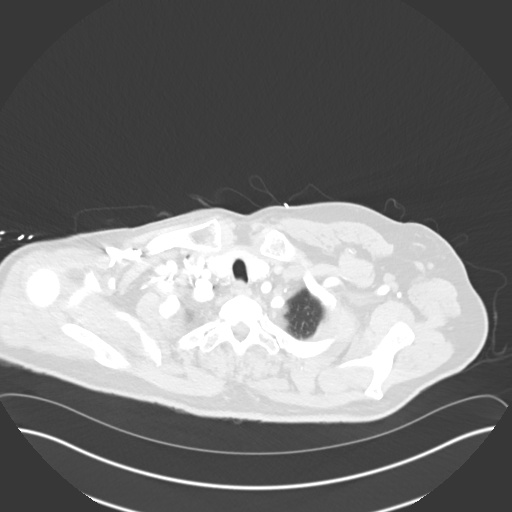

[Series 9: coronals · coronal · 0.88mm/px · 1 of 165 slices shown, 2 images]
[im 83/165  mediastinal]
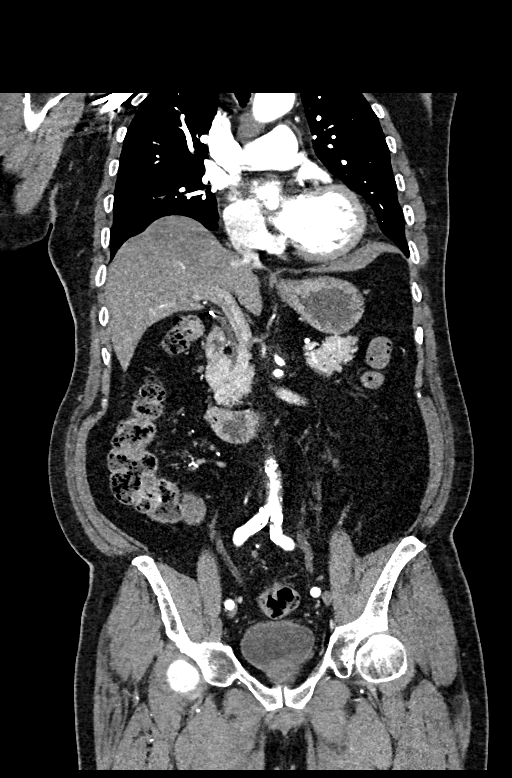
[im 83/165  bone]
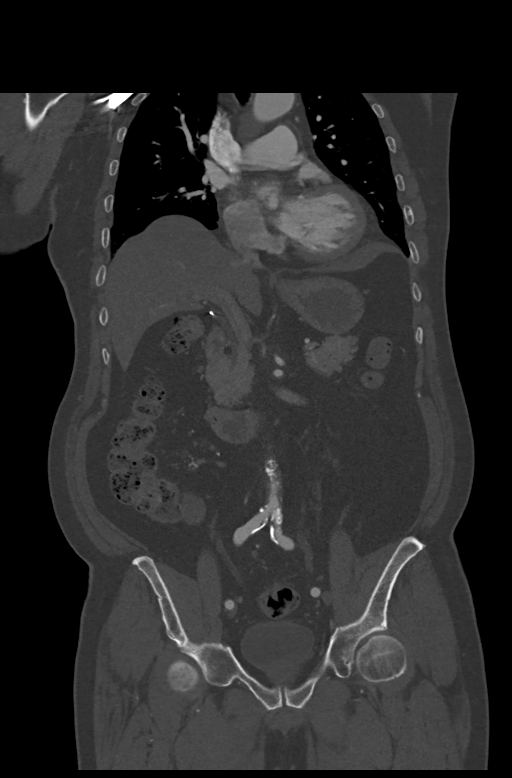

[11 of 36 positions shown; findings below may reference images not displayed]

FINDINGS: CTA CHEST FINDINGS

Cardiovascular: Maximal diameter of the ascending aorta is 4.0 cm.
There is no evidence of aortic dissection or intramural hematoma.
Atherosclerotic calcifications of the aortic arch are noted. There
are atherosclerotic calcifications in the descending thoracic aorta
associated with circumferential irregular soft plaque. There is no
significant luminal narrowing. Great vessels are patent. There is no
obvious evidence of acute pulmonary thromboembolism. Minimal
coronary artery calcification.

Mediastinum/Nodes: There is no abnormal mediastinal adenopathy. Tiny
hypodensity in the left lobe of the thyroid is likely
nonsignificant. Esophagus is within normal limits. No pericardial
effusion.

Lungs/Pleura: No pneumothorax or pleural effusion. Minimal dependent
atelectasis in the lungs.

Musculoskeletal: No vertebral compression deformity.

Review of the MIP images confirms the above findings.

CTA ABDOMEN AND PELVIS FINDINGS

VASCULAR

Aorta: Diffuse atherosclerotic and soft irregular plaque throughout
the abdominal aorta is present. No significant luminal narrowing. No
evidence of aortic aneurysm.

Celiac: There is significant narrowing at the origin likely due to
median arcuate ligament syndrome. Branch vessels are patent.

SMA: Mild atherosclerotic calcification at the origin. No
significant narrowing.

Renals: There are atherosclerotic changes at the origin of both
renal arteries without significant luminal narrowing.

IMA: Patent.

Inflow: Atherosclerotic calcification and mild smooth soft plaque in
the common iliac arteries is present without significant luminal
narrowing. No evidence of dissection. Internal and external iliac
arteries are widely patent.

Review of the MIP images confirms the above findings.

NON-VASCULAR

Hepatobiliary: Liver is within normal limits.  Postcholecystectomy.

Pancreas: Unremarkable

Spleen: Unremarkable

Adrenals/Urinary Tract: Multiple simple cysts in the kidneys. No
definite solid mass. Adrenal glands are within normal limits. No
evidence of urinary calculus. Bladder is within normal limits.

Stomach/Bowel: No obvious mass in the colon. No evidence of
small-bowel obstruction. Stomach is decompressed. Duodenum is within
normal limits.

Lymphatic: No abnormal retroperitoneal adenopathy.

Reproductive: The prostate is enlarged and somewhat lobulated.

Other: No free fluid. Postoperative changes in the anterior
abdominal wall of the lower abdomen. This is presumably from prior
hernia repair.

Musculoskeletal: No vertebral compression deformity.

Review of the MIP images confirms the above findings.
IMPRESSION: Vascular:

No evidence of abdominal or thoracic aortic dissection.

Maximal diameter of the ascending aorta is 4.0 cm. Recommend annual
imaging followup by CTA or MRA. This recommendation follows 5434
ACCF/AHA/AATS/ACR/ASA/SCA/SENGOKU/KLEVER/DIAMANTE/RONLOR Guidelines for the
Diagnosis and Management of Patients with Thoracic Aortic Disease.
Circulation. 5434; 121: [id number]

Scattered atherosclerotic changes are delineated above.

Nonvascular:

No active cardiopulmonary disease.

Prostate is lobulated and enlarged. Correlation with physical exam
and PSA is recommended.

Aortic Atherosclerosis (X47OO-MW2.2).

## 2019-06-27 ENCOUNTER — Other Ambulatory Visit: Payer: Self-pay

## 2019-06-27 ENCOUNTER — Ambulatory Visit (INDEPENDENT_AMBULATORY_CARE_PROVIDER_SITE_OTHER): Payer: Medicare Other | Admitting: Family Medicine

## 2019-06-27 DIAGNOSIS — M25512 Pain in left shoulder: Secondary | ICD-10-CM | POA: Diagnosis not present

## 2019-06-27 DIAGNOSIS — G8929 Other chronic pain: Secondary | ICD-10-CM | POA: Insufficient documentation

## 2019-06-27 MED ORDER — METHYLPREDNISOLONE ACETATE 40 MG/ML IJ SUSP
40.0000 mg | Freq: Once | INTRAMUSCULAR | Status: AC
  Administered 2019-06-27: 40 mg via INTRA_ARTICULAR

## 2019-06-27 NOTE — Progress Notes (Signed)
    CHIEF COMPLAINT / HPI: Visit conducted with interpreter Initially he discussed many complaints but ultimately we decided that he wanted me to address his left shoulder pain.  Stiffness and pain with elevation above 90 degrees in forward flexion or abduction.  Longstanding.  No numbness in his hand.  No prior injury.  No prior surgery.  He is right-hand dominant  REVIEW OF SYSTEMS: No unusual weight change, no other unusual arthralgias.  He has chronic back pain that is unchanged, chronic chest pain that is unchanged and not associated with exertion.  PERTINENT  PMH / PSH: I have reviewed the patient's medications, allergies, past medical and surgical history, smoking status and updated in the EMR as appropriate.   OBJECTIVE: GENERAL: Well-developed slightly overweight male no acute distress SHOULDERS: Bilateral are symmetrical.  Right shoulder has full range of motion normal strength in all planes the rotator cuff.  Left shoulder has normal strength.  He has significant pain with resisted abduction and resisted forward flexion but does have passive range of motion that is full.  He has some pain with the 160 to 180 degree forward flexion and similarly 1 60-1 80 in abduction.  Internal rotation on the left, he can place his hand to the lateral portion of his hip. NEURO: Intact sensation to soft touch bilateral hands.  DTRs 2+ symmetrical at elbow.  2+ symmetrical forearm.  C4-5 and 6 strength intact. SKIN: Skin around the left shoulder area is without any sign of unusual erythema, no unusual warmth, no lesions or rash.  PROCEDURE: INJECTION: Patient was given informed consent, signed copy in the chart. Appropriate time out was taken. Area prepped and draped in usual sterile fashion. Ethyl chloride was  used for local anesthesia. A 21 gauge 1 1/2 inch needle was used.. 1 cc of methylprednisolone 40 mg/ml plus 4 cc of 1% lidocaine without epinephrine was injected into the left subacromial bursa  using a(n) posterior approach.   The patient tolerated the procedure well. There were no complications. Post procedure instructions were given.   ASSESSMENT / PLAN:   No problem-specific Assessment & Plan notes found for this encounter.

## 2019-06-27 NOTE — Assessment & Plan Note (Signed)
We decided to perform a corticosteroid injection today.  Home exercise given in handout form and explained using interpreter.  I will see him back in 3 to 4 weeks.

## 2019-07-14 NOTE — Progress Notes (Addendum)
Subjective:    Patient ID: Omar Sanders, male    DOB: 27-Aug-1941, 78 y.o.   MRN: [id number]   CC: f/u HTN, diabetes, and new dark stools   HPI: Hypertension: - Medications: lisinopril 5mg (chosen by previous PCP for renal protection) - Compliance: good  - Checking BP at home: only checks sometimes, usually [id number] systolic  - Denies any SOB, CP, vision changes, LE edema, medication SEs, or symptoms of hypotension - Diet: low salt, low red meat  - Exercise: daily, also goes to PT   Diabetes  Fasting checks: does not check Post prandial does not check  Compliance: does not check Diet: see above   Exercise: see above  Eye exam: will order ophthalmology referral today  Foot exam: UTD, 04/15/2019 A1C: 6.5 Symptoms: no symptoms of hypoglycemia. no symptoms of  polyuria, polydipsia. no numbness in extremities, and no foot ulcers/trauma Meds: diet controlled  Pneumonia vaccine: completed  Urine micro albumin:creatine ratio: sees nephrology for CKD   Monitoring Labs and Parameters Last A1C:  Lab Results  Component Value Date   HGBA1C 6.5 07/15/2019   Last Lipid:     Component Value Date/Time   CHOL 130 04/15/2019 1434   HDL 33 (L) 04/15/2019 1434   Last Bmet  Potassium  Date Value Ref Range Status  07/15/2019 4.5 3.5 - 5.2 mmol/L Final   Sodium  Date Value Ref Range Status  07/15/2019 144 134 - 144 mmol/L Final   Creat  Date Value Ref Range Status  07/31/2016 1.55 (H) 0.70 - 1.18 mg/dL Final    Comment:      For patients > or = 80 years of age: The upper reference limit for Creatinine is approximately 13% higher for people identified as African-American.      Creatinine, Ser  Date Value Ref Range Status  07/15/2019 1.79 (H) 0.76 - 1.27 mg/dL Final      Dark stools Patient reports new dark stools.  States it is not with every bowel movement but is with some.  Is not taking any iron supplementation.  Denies any frank bleeding.  Denies any dizziness.  Does  report some constipation but is having a daily bowel movement.  Has not had any recent medication changes.  Denies any dyspepsia.  Objective:  BP 130/72   Pulse 81   SpO2 97%  Vitals and nursing note reviewed  General: well nourished, in no acute distress HEENT: normocephalic, no scleral icterus or conjunctival pallor Neck: supple Cardiac: RRR, clear S1 and S2, no murmurs, rubs, or gallops Respiratory: clear to auscultation bilaterally, no increased work of breathing Abdomen: soft, diffuse tenderness, non distended, no masses or organomegaly. Bowel sounds present Extremities: no edema or cyanosis. Warm, well perfused. 2  Skin: warm and dry, no rashes noted Neuro: alert and oriented, no focal deficits   Assessment & Plan:    Essential hypertension Normotensive today.  We will plan to continue lisinopril 5 mg as this will also help with renal protection.  Is followed by nephrology.  Advised to continue low-salt diet as well as daily exercise.  Follow-up in 3 months.  Diabetes ([place]) Currently diet controlled.  A1c is 6.5.  Advised that if A1c rises he may need to start medication at this time.  Patient will continue healthy diet and daily exercise.  Referral to ophthalmology placed.  Follow-up in 3 months.  Dark stools Patient with dark stools.  Not present with every bowel movement which is reassuring.  FOBT negative so no  bleeding noted on exam.  CBC showing similar hemoglobin to previous so unlikely acute bleed.  Vital signs are stable.  Patient is otherwise asymptomatic.  Does have a history of colonic polyps but colonoscopy in 2018 was otherwise negative.  Has a history of irritable bowel syndrome as well which could be contributing.  Patient had diffuse tenderness on abdominal exam however did not appear to be in pain.  CMP within normal limits of liver enzymes so unlikely liver pathology. No weight loss. Advised that patient continue to use famotidine, unable to take PPI due to  allergy.  Advised that if dark stools continue he should follow-up.  At that time can consider GI referral.  Strict return precautions given.  Follow-up if no improvement of symptoms.    Return in about 3 months (around 10/14/2019).   Caroline More, DO, PGY-3

## 2019-07-15 ENCOUNTER — Encounter: Payer: Self-pay | Admitting: Family Medicine

## 2019-07-15 ENCOUNTER — Ambulatory Visit (INDEPENDENT_AMBULATORY_CARE_PROVIDER_SITE_OTHER): Payer: Medicare Other | Admitting: Family Medicine

## 2019-07-15 ENCOUNTER — Other Ambulatory Visit: Payer: Self-pay

## 2019-07-15 VITALS — BP 130/72 | HR 81

## 2019-07-15 DIAGNOSIS — I10 Essential (primary) hypertension: Secondary | ICD-10-CM

## 2019-07-15 DIAGNOSIS — R195 Other fecal abnormalities: Secondary | ICD-10-CM | POA: Diagnosis not present

## 2019-07-15 DIAGNOSIS — E1122 Type 2 diabetes mellitus with diabetic chronic kidney disease: Secondary | ICD-10-CM

## 2019-07-15 LAB — POCT GLYCOSYLATED HEMOGLOBIN (HGB A1C): HbA1c, POC (controlled diabetic range): 6.5 % (ref 0.0–7.0)

## 2019-07-15 LAB — HEMOCCULT GUIAC POC 1CARD (OFFICE): Fecal Occult Blood, POC: NEGATIVE

## 2019-07-15 MED ORDER — FAMOTIDINE 10 MG PO TABS: 10.0000 mg | ORAL_TABLET | Freq: Every day | ORAL | 3 refills | Status: DC

## 2019-07-15 NOTE — Patient Instructions (Signed)
It was a pleasure seeing you today.   Today we discussed your dark stools  For your stools: please come back in if the stool color does not improve. If any worsening stomach pain please give [place] a call as you may need to see a GI doctor.   Please follow up in 1 month if no improvement, 3 months for blood pressure or sooner if symptoms persist or worsen. Please call the clinic immediately if you have any concerns.   Our clinic's number is [phone]. Please call with questions or concerns.   Please go to the emergency room if blood in stool   Thank you,  Caroline More, DO

## 2019-07-16 ENCOUNTER — Telehealth: Payer: Self-pay

## 2019-07-16 DIAGNOSIS — R195 Other fecal abnormalities: Secondary | ICD-10-CM | POA: Insufficient documentation

## 2019-07-16 LAB — CBC
Hematocrit: 37.7 % (ref 37.5–51.0)
Hemoglobin: 12.4 g/dL — ABNORMAL LOW (ref 13.0–17.7)
MCH: 27.1 pg (ref 26.6–33.0)
MCHC: 32.9 g/dL (ref 31.5–35.7)
MCV: 83 fL (ref 79–97)
Platelets: 181 10*3/uL (ref 150–450)
RBC: 4.57 x10E6/uL (ref 4.14–5.80)
RDW: 13.9 % (ref 11.6–15.4)
WBC: 5.2 10*3/uL (ref 3.4–10.8)

## 2019-07-16 LAB — COMPREHENSIVE METABOLIC PANEL
ALT: 23 IU/L (ref 0–44)
AST: 19 IU/L (ref 0–40)
Albumin/Globulin Ratio: 1.5 (ref 1.2–2.2)
Albumin: 4.1 g/dL (ref 3.7–4.7)
Alkaline Phosphatase: 61 IU/L (ref 39–117)
BUN/Creatinine Ratio: 13 (ref 10–24)
BUN: 23 mg/dL (ref 8–27)
Bilirubin Total: 0.4 mg/dL (ref 0.0–1.2)
CO2: 24 mmol/L (ref 20–29)
Calcium: 9 mg/dL (ref 8.6–10.2)
Chloride: 106 mmol/L (ref 96–106)
Creatinine, Ser: 1.79 mg/dL — ABNORMAL HIGH (ref 0.76–1.27)
GFR calc Af Amer: 40 mL/min/{1.73_m2} — ABNORMAL LOW (ref >59)
GFR calc non Af Amer: 35 mL/min/{1.73_m2} — ABNORMAL LOW (ref >59)
Globulin, Total: 2.8 g/dL (ref 1.5–4.5)
Glucose: 90 mg/dL (ref 65–99)
Potassium: 4.5 mmol/L (ref 3.5–5.2)
Sodium: 144 mmol/L (ref 134–144)
Total Protein: 6.9 g/dL (ref 6.0–8.5)

## 2019-07-16 NOTE — Assessment & Plan Note (Addendum)
Patient with dark stools.  Not present with every bowel movement which is reassuring.  FOBT negative so no bleeding noted on exam.  CBC showing similar hemoglobin to previous so unlikely acute bleed.  Vital signs are stable.  Patient is otherwise asymptomatic.  Does have a history of colonic polyps but colonoscopy in 2018 was otherwise negative.  Has a history of irritable bowel syndrome as well which could be contributing.  Patient had diffuse tenderness on abdominal exam however did not appear to be in pain.  CMP within normal limits of liver enzymes so unlikely liver pathology. No weight loss. Advised that patient continue to use famotidine, unable to take PPI due to allergy.  Advised that if dark stools continue he should follow-up.  At that time can consider GI referral.  Strict return precautions given.  Follow-up if no improvement of symptoms.

## 2019-07-16 NOTE — Assessment & Plan Note (Addendum)
Normotensive today.  We will plan to continue lisinopril 5 mg as this will also help with renal protection.  Is followed by nephrology.  Advised to continue low-salt diet as well as daily exercise.  Follow-up in 3 months.

## 2019-07-16 NOTE — Telephone Encounter (Signed)
Called patient with results per Dr. Tammi Klippel and left voice message using [place] ID # [phone].  Omar Sanders, [place]

## 2019-07-16 NOTE — Assessment & Plan Note (Signed)
Currently diet controlled.  A1c is 6.5.  Advised that if A1c rises he may need to start medication at this time.  Patient will continue healthy diet and daily exercise.  Referral to ophthalmology placed.  Follow-up in 3 months.

## 2019-07-17 ENCOUNTER — Telehealth: Payer: Self-pay | Admitting: Family Medicine

## 2019-07-17 NOTE — Telephone Encounter (Signed)
Not sure what patient is saying but he would like an appointment with the ophthalmologist.  Can you call him to help him with this?  His phone is [phone].

## 2019-07-18 NOTE — Telephone Encounter (Signed)
Spoke to [employer] (Referral Coordinator) who states that she sent a listing of opthomology offices in the area and a letter stating that because patient has private insurance that he does not need an actual referral. This information was sent via mail on 07/15/2019. Patient is free to call whoever is in his network.  Omar Sanders, [place]

## 2019-07-25 ENCOUNTER — Ambulatory Visit: Payer: Medicare Other | Admitting: Family Medicine

## 2019-07-28 ENCOUNTER — Ambulatory Visit (INDEPENDENT_AMBULATORY_CARE_PROVIDER_SITE_OTHER): Payer: Medicare Other | Admitting: Family Medicine

## 2019-07-28 ENCOUNTER — Other Ambulatory Visit: Payer: Self-pay

## 2019-07-28 ENCOUNTER — Encounter: Payer: Self-pay | Admitting: Family Medicine

## 2019-07-28 VITALS — BP 118/70 | Ht 67.0 in | Wt 180.0 lb

## 2019-07-28 DIAGNOSIS — M67912 Unspecified disorder of synovium and tendon, left shoulder: Secondary | ICD-10-CM

## 2019-07-28 DIAGNOSIS — M7542 Impingement syndrome of left shoulder: Secondary | ICD-10-CM

## 2019-07-28 NOTE — Progress Notes (Signed)
PCP: Omar More, DO  Subjective:   CC: Patient is a 78 y.o. male here for continued left shoulder pain and dysfunction.  HPI: Omar Sanders is an 78 year old male presenting for follow-up for his left shoulder.  Patient was seen 1 month ago and received subacromial steroid injection which greatly improved his pain and range of motion.  Patient continues to have pain with motion specifically in extension of the shoulder.  He states that his overall pain level and range of motion is much improved but not back to baseline.  Patient has been doing his at home exercises with the resistance band provided at last appointment and he has noticed that when he does not do the exercises his pain worsens.  No new injuries, skin changes.  ROS: See HPI above. I have personally reviewed pertinent past medical history, surgical, family, and social history as appropriate.     Objective:  BP 118/70   Ht 5\' 7"  (1.702 m)   Wt 180 lb (81.6 kg)   BMI 28.19 kg/m   Physical Exam: Gen: NAD, comfortable in exam room Left shoulder: Observation: Negative for deformity Palpation: Positive tenderness along lateral deltoid ROM: Abduction and forward flexion about 20% less than contralateral  Shoulder.  Extension only about 10 to 15 degrees. Strength: Decreased strength of abduction compared to contralateral shoulder.  Biceps strength symmetrical Sensation: Symmetrical sensation Special tests: Empty can test positive for pain and weakness on left  Spine: Observation: Negative for deformity Palpation: Increased tone of musculature of left trapezoid and shoulder girdle muscles.  Tenderness to palpation along supraspinous fossa. Special tests: Negative Spurling test   Assessment & Plan:  Rotator cuff disorder, left History and physical exam support diagnosis of rotator cuff dysfunction which is improved after subacromial steroid injection at last appointment. Patient still not at full function. -Recommend formal  physical therapy at this point to strengthen shoulder.  Patient has barriers to treatment with transportation and language so have arranged today for patient to begin physical therapy with transportation this week and interpreter has already been scheduled. -Return in approximately 6 weeks  I independently examined pertinent imaging in relation to cc.  Doristine Mango, [facility] PGY-2

## 2019-07-28 NOTE — Assessment & Plan Note (Signed)
History and physical exam support diagnosis of rotator cuff dysfunction which is improved after steroid injection at last appointment. Patient still not at full function. -Recommend formal physical therapy at this point to strengthen shoulder.  Patient has barriers to treatment with transportation and language so have arranged today for patient to begin physical therapy with transportation this week and interpreter has already been scheduled. -Return in approximately 6 weeks if not improved

## 2019-07-28 NOTE — Patient Instructions (Signed)
You're doing great! We will try to arrange for you to do physical therapy. Do home exercises on days you don't go to therapy. Tylenol if needed for pain. Follow up with [place] in 6 weeks for reevaluation.

## 2019-07-31 ENCOUNTER — Ambulatory Visit: Payer: Medicare Other

## 2019-08-01 ENCOUNTER — Ambulatory Visit: Payer: Medicare Other | Attending: Family Medicine | Admitting: Physical Therapy

## 2019-08-04 ENCOUNTER — Telehealth: Payer: Self-pay | Admitting: Family Medicine

## 2019-08-04 ENCOUNTER — Other Ambulatory Visit: Payer: Self-pay | Admitting: Family Medicine

## 2019-08-04 DIAGNOSIS — N400 Enlarged prostate without lower urinary tract symptoms: Secondary | ICD-10-CM

## 2019-08-04 MED ORDER — ATORVASTATIN CALCIUM 40 MG PO TABS: 40.0000 mg | ORAL_TABLET | Freq: Every day | ORAL | 3 refills | Status: DC

## 2019-08-04 MED ORDER — FINASTERIDE 5 MG PO TABS: 5.0000 mg | ORAL_TABLET | Freq: Every day | ORAL | 5 refills | Status: DC

## 2019-08-04 NOTE — Telephone Encounter (Signed)
Pt would like to have his Atorvastatin and Finasteride refilled.

## 2019-08-04 NOTE — Telephone Encounter (Signed)
error 

## 2019-08-07 ENCOUNTER — Other Ambulatory Visit: Payer: Medicare Other

## 2019-09-01 ENCOUNTER — Other Ambulatory Visit: Payer: Self-pay

## 2019-09-01 ENCOUNTER — Ambulatory Visit: Payer: Medicare Other | Attending: Family Medicine | Admitting: Physical Therapy

## 2019-09-01 DIAGNOSIS — M25512 Pain in left shoulder: Secondary | ICD-10-CM | POA: Insufficient documentation

## 2019-09-01 DIAGNOSIS — G8929 Other chronic pain: Secondary | ICD-10-CM | POA: Insufficient documentation

## 2019-09-01 DIAGNOSIS — M25612 Stiffness of left shoulder, not elsewhere classified: Secondary | ICD-10-CM | POA: Diagnosis present

## 2019-09-02 ENCOUNTER — Other Ambulatory Visit: Payer: Self-pay

## 2019-09-02 NOTE — Therapy (Signed)
[place], [place], [address] Phone: [phone]   Fax:  [phone]  Physical Therapy Evaluation  Patient Details  Name: Omar Sanders MRN: [id number] Date of Birth: 1941-05-16 Referring Provider (PT): Dr Karlton Lemon    Encounter Date: 09/01/2019  PT End of Session - 09/02/19 1128    Visit Number  1    Number of Visits  12    Date for PT Re-Evaluation  10/14/19    Authorization Type  [employer] Medicare Mediciad    PT Start Time  1100    PT Stop Time  1142    PT Time Calculation (min)  42 min    Activity Tolerance  Patient tolerated treatment well    Behavior During Therapy  [facility] for tasks assessed/performed       Past Medical History:  Diagnosis Date  . Allergy   . Anal fissure   . Anemia   . Anxiety   . Benign prostatic hyperplasia   . Bilateral lower extremity edema 03/22/2018  . Chest pain 02/2018  . Chronic pain   . Colon polyp   . GERD (gastroesophageal reflux disease)   . Headache(784.0)   . Hemorrhoids   . Hyperlipidemia    Hx: of  . Hypertension   . IBS (irritable bowel syndrome)   . Incontinent of urine    Hx:of  . Migraines   . Neuromuscular disorder ([place])    periferal neuropathy per patient  . New onset right bundle branch block (RBBB)   . OSA (obstructive sleep apnea) 11/2009   sleep study with mild OSA, pt unwilling to use CPAP  . Seizures ([place])     Past Surgical History:  Procedure Laterality Date  . 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 CATARACT EXTRACTION     bilat  . CHOLECYSTECTOMY  2008  . COLONOSCOPY  2012   Dr Collene Mares  . HEMORRHOID SURGERY     anal surgery  . INGUINAL HERNIA REPAIR Bilateral 03/11/2013   Procedure: LAPAROSCOPIC BILATERAL INGUINAL HERNIA REPAIR;  Surgeon: Ralene Ok, MD;  Location: [place];  Service: General;  Laterality: Bilateral;  . INSERTION OF MESH Bilateral 03/11/2013   Procedure:  INSERTION OF MESH;  Surgeon: Ralene Ok, MD;  Location: [facility];  Service: General;  Laterality: Bilateral;  . NM MYOCAR PERF WALL MOTION  05/27/2009   mild iscemia mid inferio & apical regions.  . NM MYOCAR PERF WALL MOTION  11/01/2012   negative for ischemia  . US ECHOCARDIOGRAPHY  05/27/2009   mild mitral annular ca+,AOV mildly sclerotic    There were no vitals filed for this visit.   Subjective Assessment - 09/01/19 1109    Subjective  Patient has had pain inhis left shoulder and neck for sveral years now. The pain has gotten worse. He feels like the cold weather makes it a bit worse.    Pertinent History  Low Back Pain    Currently in Pain?  Yes    Pain Score  --   unable to give a number 2nd to language barrier   Pain Location  Shoulder    Pain Orientation  Left    Pain Descriptors / Indicators  Aching    Pain Type  Chronic pain    Pain Onset  More than a month ago    Pain Frequency  Constant    Aggravating Factors   use of the arm; sometimes  just  comes on    Pain Relieving Factors  Light exercises.    Effect of Pain on Daily Activities  difficulty using his left shoulder         [facility] PT Assessment - 09/02/19 0001      Assessment   Medical Diagnosis  Left HSoulder Pain     Referring Provider (PT)  Dr Karlton Lemon     Hand Dominance  Right    Next MD Visit  Nothing scheduled     Prior Therapy  For Lower back       Precautions   Precautions  None      Restrictions   Weight Bearing Restrictions  No      Balance Screen   Has the patient fallen in the past 6 months  No    Has the patient had a decrease in activity level because of a fear of falling?   No    Is the patient reluctant to leave their home because of a fear of falling?   No      Home Environment   Additional Comments  Nothing pertninant       Prior Function   Level of Independence  Independent    Vocation  Retired      Associate Professor   Overall Cognitive Status  Within Functional Limits for tasks  assessed    Attention  Focused      Observation/Other Assessments   Focus on Therapeutic Outcomes (FOTO)   does not speak english       Sensation   Light Touch  Appears Intact      Coordination   Gross Motor Movements are Fluid and Coordinated  Yes    Fine Motor Movements are Fluid and Coordinated  Yes      AROM   Right Shoulder Flexion  88 Degrees    Right Shoulder Internal Rotation  --   Pain with IR    Right Shoulder External Rotation  --   mild limitation in range but painful   Cervical - Right Rotation  WNL for age but painful to the left; pulling feeling on the left when turning to the right       PROM   Right Shoulder Flexion  110 Degrees    Right Shoulder External Rotation  54 Degrees      Strength   Right Shoulder Flexion  3+/5    Right Shoulder Internal Rotation  4/5    Right Shoulder External Rotation  4/5      Palpation   Palpation comment  tenderness to palpation in posterior shoulder and upper trap; minor pain in the anterior shoulder                 Objective measurements completed on examination: See above findings.      [facility] Adult PT Treatment/Exercise - 09/02/19 0001      Shoulder Exercises: Supine   Other Supine Exercises  supine wand flexion x10    Other Supine Exercises  supine wand ER x10       Shoulder Exercises: Standing   Other Standing Exercises  scap retraction 2x10 red       Manual Therapy   Manual Therapy  Joint mobilization    Manual therapy comments  gentle PROM into all planes     Joint Mobilization  grade I and II inferior glides and PA glides              PT Education - 09/02/19 1100  Education Details  HEP and symptom mnagement; AAROM and improtance of not driving through pain    Person(s) Educated  Patient    Methods  Explanation;Demonstration;Tactile cues;Verbal cues    Comprehension  Verbalized understanding;Verbal cues required;Tactile cues required;Returned demonstration       PT Short Term Goals  - 09/02/19 1105      PT SHORT TERM GOAL #1   Title  Patient will increase passive flexion by 20 degrees    Time  3    Period  Weeks    Status  New    Target Date  09/23/19      PT SHORT TERM GOAL #2   Title  Patient will increase passive ER by 10 degrees on the left    Time  3    Period  Weeks    Status  New    Target Date  09/23/19      PT SHORT TERM GOAL #3   Title  Patient will increase left shoulder flexion and ER strength to 4+/5    Time  3    Period  Weeks    Status  New    Target Date  09/23/19        PT Long Term Goals - 09/02/19 1109      PT LONG TERM GOAL #1   Title  Patient will report a 50% reduction in pain when reaching overhead    Time  6    Period  Weeks    Status  New    Target Date  10/14/19      PT LONG TERM GOAL #2   Title  Patient will reach behind his back withou pain    Time  4    Period  Weeks    Status  New    Target Date  09/30/19      PT LONG TERM GOAL #3   Title  Patient will grab object from the first shelf without increased pain    Time  6    Period  Weeks    Status  Achieved    Target Date  10/14/19             Plan - 09/01/19 1328    Clinical Impression Statement  Patient is an 78 year old male with left shoulder stiffness and pain. 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 He has limited passive and active motion of his shoulder. He has decreased strength with flexion and ER. He has spasming of his upper trap and posterior shoulder.    Personal Factors and Comorbidities  Age;Comorbidity 1;Comorbidity 2    Comorbidities  Low Back Pain, OA, perpheral neuropathy    Examination-Activity Limitations  Lift;Reach Overhead;Carry    Examination-Participation Restrictions  Laundry;Shop;Cleaning;Meal Prep    Stability/Clinical Decision Making  Evolving/Moderate complexity    Clinical Decision Making  Moderate    Rehab Potential  Good    PT Frequency  2x / week    PT Duration  6 weeks    PT Treatment/Interventions   ADLs/Self Care Home Management;Cryotherapy;Electrical Stimulation;Iontophoresis 4mg /ml Dexamethasone;Ultrasound;Functional mobility training;Therapeutic activities;Therapeutic exercise;Neuromuscular re-education;Patient/family education;Manual techniques;Passive range of motion;Dry needling;Taping    PT Next Visit Plan  begin with PROM and joint mobilization; modalities PRN, shoulder extension, scap retraction; begin RTC strengthening when able, consider sidelying ER; consder table slide; consider pulleys    PT Home Exercise Plan  wand shoulder press to light flexion; scap retraction; wand ER    Consulted and Agree with Plan  of Care  Patient       Patient will benefit from skilled therapeutic intervention in order to improve the following deficits and impairments:  Increased muscle spasms, Decreased endurance, Decreased range of motion, Decreased activity tolerance, Improper body mechanics, Decreased safety awareness, Pain, Postural dysfunction, Impaired UE functional use, Increased fascial restricitons  Visit Diagnosis: Chronic left shoulder pain - Plan: PT plan of care cert/re-cert  Stiffness of left shoulder, not elsewhere classified - Plan: PT plan of care cert/re-cert     Problem List Patient Active Problem List   Diagnosis Date Noted  . Rotator cuff disorder, left 07/28/2019  . Dark stools 07/16/2019  . 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 Venous stasis dermatitis of both lower extremities 06/11/2018  . Aortic aneurysm ([place]) 03/26/2018  . New onset right bundle branch block (RBBB)   . Diabetes ([place]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  . Hyperlipidemia 06/14/2016  . Chronic kidney disease 01/19/2016  . BPH (benign prostatic hyperplasia) 05/06/2015  . Generalized anxiety disorder 12/29/2014  . Anemia 10/31/2012  . GERD 05/19/2010  . IBS (irritable bowel syndrome) 05/19/2010  . PERSONAL HX COLONIC POLYPS 05/19/2010  . Essential hypertension 04/10/2007  . Seasonal allergies 04/10/2007  . HIATAL  HERNIA 04/10/2007    Carney Living PT DPT  09/02/2019, 11:35 AM  [facility] [address] [place], [place], [address] Phone: [phone]   Fax:  [phone]  Name: Omar Sanders MRN: [id number] Date of Birth: [birth year]

## 2019-09-10 ENCOUNTER — Ambulatory Visit: Payer: Medicare Other | Admitting: Physical Therapy

## 2019-09-10 ENCOUNTER — Other Ambulatory Visit: Payer: Self-pay

## 2019-09-10 ENCOUNTER — Encounter: Payer: Self-pay | Admitting: Physical Therapy

## 2019-09-10 DIAGNOSIS — M25512 Pain in left shoulder: Secondary | ICD-10-CM | POA: Diagnosis not present

## 2019-09-10 DIAGNOSIS — M25612 Stiffness of left shoulder, not elsewhere classified: Secondary | ICD-10-CM

## 2019-09-10 DIAGNOSIS — G8929 Other chronic pain: Secondary | ICD-10-CM

## 2019-09-10 NOTE — Therapy (Addendum)
[place], [place], [address] Phone: [phone]   Fax:  [phone]  Physical Therapy Treatment/Discharge  Patient Details  Name: Omar Sanders MRN: [id number] Date of Birth: 1941/06/11 Referring Provider (PT): Dr Karlton Lemon    Encounter Date: 09/10/2019  PT End of Session - 09/10/19 0936    Visit Number  2    Number of Visits  12    Date for PT Re-Evaluation  10/14/19    Authorization Type  [employer] Medicare Mediciad    PT Start Time  0930    PT Stop Time  1017    PT Time Calculation (min)  47 min    Activity Tolerance  Patient tolerated treatment well    Behavior During Therapy  [facility] for tasks assessed/performed       Past Medical History:  Diagnosis Date  . Allergy   . Anal fissure   . Anemia   . Anxiety   . Benign prostatic hyperplasia   . Bilateral lower extremity edema 03/22/2018  . Chest pain 02/2018  . Chronic pain   . Colon polyp   . GERD (gastroesophageal reflux disease)   . Headache(784.0)   . Hemorrhoids   . Hyperlipidemia    Hx: of  . Hypertension   . IBS (irritable bowel syndrome)   . Incontinent of urine    Hx:of  . Migraines   . Neuromuscular disorder ([place])    periferal neuropathy per patient  . New onset right bundle branch block (RBBB)   . OSA (obstructive sleep apnea) 11/2009   sleep study with mild OSA, pt unwilling to use CPAP  . Seizures ([place])     Past Surgical History:  Procedure Laterality Date  . 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 CATARACT EXTRACTION     bilat  . CHOLECYSTECTOMY  2008  . COLONOSCOPY  2012   Dr Collene Mares  . HEMORRHOID SURGERY     anal surgery  . INGUINAL HERNIA REPAIR Bilateral 03/11/2013   Procedure: LAPAROSCOPIC BILATERAL INGUINAL HERNIA REPAIR;  Surgeon: Ralene Ok, MD;  Location: [place] [place];  Service: General;  Laterality: Bilateral;  . INSERTION OF MESH Bilateral 03/11/2013   Procedure: INSERTION OF MESH;  Surgeon: Ralene Ok, MD;  Location: [place];  Service: General;  Laterality: Bilateral;  . NM MYOCAR PERF WALL MOTION  05/27/2009   mild iscemia mid inferio & apical regions.  . NM MYOCAR PERF WALL MOTION  11/01/2012   negative for ischemia  . US ECHOCARDIOGRAPHY  05/27/2009   mild mitral annular ca+,AOV mildly sclerotic    There were no vitals filed for this visit.  Subjective Assessment - 09/10/19 0936    Subjective  I have pain from neck, along shoulder and top of arms. Sometimes I can lift my arm and sometimes I cannot. The pain is reduced when I exercise but it comes back when I stop exercising.    Currently in Pain?  Yes    Pain Score  6     Pain Location  Shoulder    Pain Orientation  Left    Pain Descriptors / Indicators  Aching                       [facility] Adult PT Treatment/Exercise - 09/10/19 0001      Exercises   Exercises  Shoulder      Shoulder Exercises: Supine   Protraction  Limitations  supine punch 2lb    Flexion Limitations  wand flexion, full range      Shoulder Exercises: Seated   External Rotation  15 reps    Theraband Level (Shoulder External Rotation)  Level 2 (Red)      Shoulder Exercises: Sidelying   External Rotation  20 reps;10 reps    External Rotation Weight (lbs)  2    ABduction  20 reps    ABduction Weight (lbs)  0      Shoulder Exercises: Stretch   External Rotation Stretch  --   supine with wand, guided by PT     Manual Therapy   Manual Therapy  Soft tissue mobilization    Manual therapy comments  PROM shoulder    Soft tissue mobilization  Lt upper trap, scalenes, pectoralis             PT Education - 09/10/19 1010    Education Details  nutrition and exercises, changes in exercise with age and safety, walking program, gradual reduction in pain    Person(s) Educated  Patient    Methods  Explanation;Demonstration;Tactile cues;Verbal cues    Comprehension  Verbalized  understanding;Verbal cues required;Returned demonstration;Tactile cues required;Need further instruction       PT Short Term Goals - 09/02/19 1105      PT SHORT TERM GOAL #1   Title  Patient will increase passive flexion by 20 degrees    Time  3    Period  Weeks    Status  New    Target Date  09/23/19      PT SHORT TERM GOAL #2   Title  Patient will increase passive ER by 10 degrees on the left    Time  3    Period  Weeks    Status  New    Target Date  09/23/19      PT SHORT TERM GOAL #3   Title  Patient will increase left shoulder flexion and ER strength to 4+/5    Time  3    Period  Weeks    Status  New    Target Date  09/23/19        PT Long Term Goals - 09/02/19 1109      PT LONG TERM GOAL #1   Title  Patient will report a 50% reduction in pain when reaching overhead    Time  6    Period  Weeks    Status  New    Target Date  10/14/19      PT LONG TERM GOAL #2   Title  Patient will reach behind his back withou pain    Time  4    Period  Weeks    Status  New    Target Date  09/30/19      PT LONG TERM GOAL #3   Title  Patient will grab object from the first shelf without increased pain    Time  6    Period  Weeks    Status  Achieved    Target Date  10/14/19            Plan - 09/10/19 1229    Clinical Impression Statement  Pt with difficulty discussing pain- only "it still hurts" with all movements. Significant tightness in upper trap and pectoralis addressed with manual therapy today. Pt reports he tried doing fast walking but fell and hurt his knees- he is also by himself. Discussed a button call system but  says he cannot afford it. Reports a senior group told him to stay inside in case EMS needs to find him- we discussed walking for longer near the home for increased exercise.    PT Treatment/Interventions  ADLs/Self Care Home Management;Cryotherapy;Electrical Stimulation;Iontophoresis 77m/ml Dexamethasone;Ultrasound;Functional mobility  training;Therapeutic activities;Therapeutic exercise;Neuromuscular re-education;Patient/family education;Manual techniques;Passive range of motion;Dry needling;Taping    PT Next Visit Plan  begin with PROM and joint mobilization; modalities PRN, shoulder extension, scap retraction; begin RTC strengthening when able, consider sidelying ER; consder table slide; consider pulleys    PT Home Exercise Plan  wand shoulder press to light flexion; scap retraction; wand ER    Consulted and Agree with Plan of Care  Patient       Patient will benefit from skilled therapeutic intervention in order to improve the following deficits and impairments:  Increased muscle spasms, Decreased endurance, Decreased range of motion, Decreased activity tolerance, Improper body mechanics, Decreased safety awareness, Pain, Postural dysfunction, Impaired UE functional use, Increased fascial restricitons  Visit Diagnosis: Chronic left shoulder pain  Stiffness of left shoulder, not elsewhere classified     Problem List Patient Active Problem List   Diagnosis Date Noted  . Rotator cuff disorder, left 07/28/2019  . Dark stools 07/16/2019  . 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 Venous stasis dermatitis of both lower extremities 06/11/2018  . Aortic aneurysm [place] 03/26/2018  . New onset right bundle branch block (RBBB)   . Diabetes ([place] 01/29/2018  . Hyperlipidemia 06/14/2016  . Chronic kidney disease 01/19/2016  . BPH (benign prostatic hyperplasia) 05/06/2015  . Generalized anxiety disorder 12/29/2014  . Anemia 10/31/2012  . GERD 05/19/2010  . IBS (irritable bowel syndrome) 05/19/2010  . PERSONAL HX COLONIC POLYPS 05/19/2010  . Essential hypertension 04/10/2007  . Seasonal allergies 04/10/2007  . HIATAL HERNIA 04/10/2007   Brittanie Dosanjh C. Babetta Paterson PT, DPT 09/10/19 2:57 PM   [facility] [place] [address]Phone: [phone]  Fax:   [phone] Name: Omar HOUSEWORTHMRN: [id number] of Birth: 812-20-1942 PHYSICAL THERAPY DISCHARGE SUMMARY  Visits from Start of Care: 2  Current functional level related to goals / functional outcomes: See above   Remaining deficits: See above   Education / Equipment: Anatomy of condition, POC, HEP, exercise form/rationale  Plan: Patient agrees to discharge.  Patient goals were not met. Patient is being discharged due to the patient's request.  ?????    Does not feel comfortable coming to the clinic due to COVID-19.  Jaxan Michel C. Karyna Bessler PT, DPT 10/02/19 1:47 PM

## 2019-09-17 ENCOUNTER — Encounter: Payer: Medicare Other | Admitting: Physical Therapy

## 2019-09-17 ENCOUNTER — Ambulatory Visit: Payer: Medicare Other | Admitting: Physical Therapy

## 2019-09-24 ENCOUNTER — Encounter: Payer: Medicare Other | Admitting: Physical Therapy

## 2019-09-24 ENCOUNTER — Ambulatory Visit: Payer: Medicare Other | Admitting: Physical Therapy

## 2019-09-29 ENCOUNTER — Telehealth: Payer: Self-pay | Admitting: Internal Medicine

## 2019-09-29 ENCOUNTER — Other Ambulatory Visit: Payer: Self-pay | Admitting: Family Medicine

## 2019-09-29 NOTE — Telephone Encounter (Signed)
[facility] calling for pt. Rx for famotidine has been received for 90 tabs. Pt would like to get 180. Please advise. Ottis Stain, CMA

## 2019-09-29 NOTE — Telephone Encounter (Signed)
Called and there was no voicemail set up for pt.  Pt has not been seen in the office since 2017 and Dr. Henrene Pastor does not fill his Pepcid.  Dr. Tammi Klippel has filled this for him in the past.  Pt needs an appt if he would like Henrene Pastor to fill.

## 2019-09-29 NOTE — Telephone Encounter (Signed)
I will let Dr. Tammi Klippel refill to be sure we order the dose and frequency that she wants.

## 2019-09-30 NOTE — Telephone Encounter (Signed)
Dr. Tammi Klippel has refilled prescription for Pepcid.  It looks like refill request was sent in error.  No need to contact pt.

## 2019-10-01 ENCOUNTER — Encounter: Payer: Medicare Other | Admitting: Physical Therapy

## 2019-10-01 ENCOUNTER — Ambulatory Visit: Payer: Medicare Other | Admitting: Physical Therapy

## 2019-10-01 NOTE — Telephone Encounter (Signed)
noted 

## 2019-11-09 ENCOUNTER — Encounter (HOSPITAL_COMMUNITY): Payer: Self-pay

## 2019-11-09 ENCOUNTER — Emergency Department (HOSPITAL_COMMUNITY): Payer: Medicare Other

## 2019-11-09 ENCOUNTER — Emergency Department (HOSPITAL_COMMUNITY)
Admission: EM | Admit: 2019-11-09 | Discharge: 2019-11-09 | Disposition: A | Discharge status: Home / Self Care | Payer: Medicare Other | Attending: Emergency Medicine | Admitting: Emergency Medicine

## 2019-11-09 ENCOUNTER — Other Ambulatory Visit: Payer: Self-pay

## 2019-11-09 DIAGNOSIS — N189 Chronic kidney disease, unspecified: Secondary | ICD-10-CM | POA: Diagnosis not present

## 2019-11-09 DIAGNOSIS — B349 Viral infection, unspecified: Secondary | ICD-10-CM | POA: Diagnosis not present

## 2019-11-09 DIAGNOSIS — Z20822 Contact with and (suspected) exposure to covid-19: Secondary | ICD-10-CM | POA: Diagnosis not present

## 2019-11-09 DIAGNOSIS — I129 Hypertensive chronic kidney disease with stage 1 through stage 4 chronic kidney disease, or unspecified chronic kidney disease: Secondary | ICD-10-CM | POA: Insufficient documentation

## 2019-11-09 DIAGNOSIS — Z79899 Other long term (current) drug therapy: Secondary | ICD-10-CM | POA: Insufficient documentation

## 2019-11-09 DIAGNOSIS — E1122 Type 2 diabetes mellitus with diabetic chronic kidney disease: Secondary | ICD-10-CM | POA: Diagnosis not present

## 2019-11-09 DIAGNOSIS — R05 Cough: Secondary | ICD-10-CM | POA: Diagnosis present

## 2019-11-09 LAB — COMPREHENSIVE METABOLIC PANEL
ALT: 24 U/L (ref 0–44)
AST: 23 U/L (ref 15–41)
Albumin: 3.8 g/dL (ref 3.5–5.0)
Alkaline Phosphatase: 48 U/L (ref 38–126)
Anion gap: 6 (ref 5–15)
BUN: 14 mg/dL (ref 8–23)
CO2: 28 mmol/L (ref 22–32)
Calcium: 8.9 mg/dL (ref 8.9–10.3)
Chloride: 104 mmol/L (ref 98–111)
Creatinine, Ser: 1.42 mg/dL — ABNORMAL HIGH (ref 0.61–1.24)
GFR calc Af Amer: 54 mL/min — ABNORMAL LOW (ref >60)
GFR calc non Af Amer: 46 mL/min — ABNORMAL LOW (ref >60)
Glucose, Bld: 86 mg/dL (ref 70–99)
Potassium: 4 mmol/L (ref 3.5–5.1)
Sodium: 138 mmol/L (ref 135–145)
Total Bilirubin: 0.9 mg/dL (ref 0.3–1.2)
Total Protein: 7.5 g/dL (ref 6.5–8.1)

## 2019-11-09 LAB — CBC WITH DIFFERENTIAL/PLATELET
Abs Immature Granulocytes: 0.01 10*3/uL (ref 0.00–0.07)
Basophils Absolute: 0 10*3/uL (ref 0.0–0.1)
Basophils Relative: 1 %
Eosinophils Absolute: 0 10*3/uL (ref 0.0–0.5)
Eosinophils Relative: 1 %
HCT: 40 % (ref 39.0–52.0)
Hemoglobin: 12.8 g/dL — ABNORMAL LOW (ref 13.0–17.0)
Immature Granulocytes: 0 %
Lymphocytes Relative: 30 %
Lymphs Abs: 1.2 10*3/uL (ref 0.7–4.0)
MCH: 26.8 pg (ref 26.0–34.0)
MCHC: 32 g/dL (ref 30.0–36.0)
MCV: 83.9 fL (ref 80.0–100.0)
Monocytes Absolute: 0.5 10*3/uL (ref 0.1–1.0)
Monocytes Relative: 13 %
Neutro Abs: 2.2 10*3/uL (ref 1.7–7.7)
Neutrophils Relative %: 55 %
Platelets: 177 10*3/uL (ref 150–400)
RBC: 4.77 MIL/uL (ref 4.22–5.81)
RDW: 14.1 % (ref 11.5–15.5)
WBC: 3.9 10*3/uL — ABNORMAL LOW (ref 4.0–10.5)
nRBC: 0 % (ref 0.0–0.2)

## 2019-11-09 LAB — POC SARS CORONAVIRUS 2 AG -  ED: SARS Coronavirus 2 Ag: NEGATIVE

## 2019-11-09 LAB — RESPIRATORY PANEL BY RT PCR (FLU A&B, COVID)
Influenza A by PCR: NEGATIVE
Influenza B by PCR: NEGATIVE
SARS Coronavirus 2 by RT PCR: NEGATIVE

## 2019-11-09 NOTE — Discharge Instructions (Signed)
Please stop Advil since this can affect the kidneys You can take Tylenol or Dayquil or Nyquil for headache or runny nose Drink plenty of fluids Please follow up with your doctor Return if worsening

## 2019-11-09 NOTE — ED Provider Notes (Signed)
[facility] EMERGENCY DEPARTMENT Provider Note   CSN: [id number] Arrival date & time: 11/09/19  1225     History Chief Complaint  Patient presents with  . Cough  . Nasal Congestion    Omar Sanders is a 79 y.o. male who presents with URI symptoms. He's had a headache, runny nose, congestion, sneezing, and SOB. He states his symptoms started a week ago. He's bee taking Advil cold and flu and Tylenol as well as flonase and Zyrtec. He has a f/u appt with his kidney doctor on Tuesday. Several family members have colds or allergies. He denies fever, chills, body aches, sore throat, cough, chest pain. History is limited due to language barrrier. A [place] interpreter was used.  HPI     Past Medical History:  Diagnosis Date  . Allergy   . Anal fissure   . Anemia   . Anxiety   . Benign prostatic hyperplasia   . Bilateral lower extremity edema 03/22/2018  . Chest pain 02/2018  . Chronic pain   . Colon polyp   . GERD (gastroesophageal reflux disease)   . Headache(784.0)   . Hemorrhoids   . Hyperlipidemia    Hx: of  . Hypertension   . IBS (irritable bowel syndrome)   . Incontinent of urine    Hx:of  . Migraines   . Neuromuscular disorder ([place])    periferal neuropathy per patient  . New onset right bundle branch block (RBBB)   . OSA (obstructive sleep apnea) 11/2009   sleep study with mild OSA, pt unwilling to use CPAP  . Seizures [facility])     Patient Active Problem List   Diagnosis Date Noted  . Rotator cuff disorder, left 07/28/2019  . Dark stools 07/16/2019  . 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 Venous stasis dermatitis of both lower extremities 06/11/2018  . Aortic aneurysm ([place]) 03/26/2018  . New onset right bundle branch block (RBBB)   . Diabetes ([place]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  . Hyperlipidemia 06/14/2016  . Chronic kidney disease 01/19/2016  . BPH (benign prostatic hyperplasia) 05/06/2015  . Generalized anxiety disorder 12/29/2014  . Anemia 10/31/2012  .  GERD 05/19/2010  . IBS (irritable bowel syndrome) 05/19/2010  . PERSONAL HX COLONIC POLYPS 05/19/2010  . Essential hypertension 04/10/2007  . Seasonal allergies 04/10/2007  . HIATAL HERNIA 04/10/2007    Past Surgical History:  Procedure Laterality Date  . 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 CATARACT EXTRACTION     bilat  . CHOLECYSTECTOMY  2008  . COLONOSCOPY  2012   Dr Collene Mares  . HEMORRHOID SURGERY     anal surgery  . INGUINAL HERNIA REPAIR Bilateral 03/11/2013   Procedure: LAPAROSCOPIC BILATERAL INGUINAL HERNIA REPAIR;  Surgeon: Ralene Ok, MD;  Location: [place];  Service: General;  Laterality: Bilateral;  . INSERTION OF MESH Bilateral 03/11/2013   Procedure: INSERTION OF MESH;  Surgeon: Ralene Ok, MD;  Location: [place];  Service: General;  Laterality: Bilateral;  . NM MYOCAR PERF WALL MOTION  05/27/2009   mild iscemia mid inferio & apical regions.  . NM MYOCAR PERF WALL MOTION  11/01/2012   negative for ischemia  . US ECHOCARDIOGRAPHY  05/27/2009   mild mitral annular ca+,AOV mildly sclerotic       Family History  Problem Relation Age of Onset  . Hypertension Mother   . Colon cancer Neg Hx     Social History   Tobacco Use  .  Smoking status: Never Smoker  . Smokeless tobacco: Never Used  Substance Use Topics  . Alcohol use: No  . Drug use: No    Home Medications Prior to Admission medications   Medication Sig Start Date End Date Taking? Authorizing Provider  atorvastatin (LIPITOR) 40 MG tablet Take 1 tablet (40 mg total) by mouth daily. 08/04/19   Caroline More, DO  baclofen (LIORESAL) 10 MG tablet Take 10 mg by mouth 3 (three) times daily.    [provider]  cetirizine (ZYRTEC) 10 MG tablet Take 1 tablet (10 mg total) by mouth daily. 03/11/19   Caroline More, DO  famotidine (PEPCID) 10 MG tablet Take 1 tablet (10 mg total) by mouth daily. 07/15/19   Caroline More, DO  famotidine  (PEPCID) 20 MG tablet TAKE 1 TABLET(20 MG) BY MOUTH TWICE DAILY 09/29/19   Caroline More, DO  fentaNYL (DURAGESIC) 25 MCG/HR Place 1 patch onto the skin every 3 (three) days.    [provider]  finasteride (PROSCAR) 5 MG tablet Take 1 tablet (5 mg total) by mouth daily. 08/04/19   Tammi Klippel, Sherin, DO  fluticasone (FLONASE) 50 MCG/ACT nasal spray Place 2 sprays into both nostrils daily. 10/14/18   Caroline More, DO  halobetasol (ULTRAVATE) 0.05 % cream Apply topically 2 (two) times daily. 02/14/19   Kinnie Feil, MD  HYDROcodone-acetaminophen (NORCO/VICODIN) 5-325 MG tablet Take 1 tablet by mouth every 6 (six) hours as needed for moderate pain.    [provider]  lisinopril (ZESTRIL) 5 MG tablet Take 1 tablet (5 mg total) by mouth at bedtime. 06/09/19   Caroline More, DO  loratadine (CLARITIN) 10 MG tablet Take 1 tablet (10 mg total) by mouth daily. 03/10/19   Caroline More, DO  nitroGLYCERIN (NITROSTAT) 0.3 MG SL tablet Place 0.3 mg under the tongue every 5 (five) minutes as needed for chest pain.    [provider]  Olopatadine HCl 0.2 % SOLN Apply 1 drop to eye daily. 05/20/19   Caroline More, DO  tamsulosin (FLOMAX) 0.4 MG CAPS capsule Take 2 capsules (0.8 mg total) by mouth daily after breakfast. 03/15/18   Mikell, Jeani Sow, MD  tizanidine (ZANAFLEX) 2 MG capsule Take 2 mg by mouth 3 (three) times daily.    [provider]    Allergies    Aspirin, Lubiprostone, Pantoprazole, and Vitamin c  Review of Systems   Review of Systems  Constitutional: Negative for fever.  HENT: Positive for rhinorrhea and sneezing.   Respiratory: Positive for shortness of breath. Negative for cough.   Neurological: Positive for headaches.  All other systems reviewed and are negative.   Physical Exam Updated Vital Signs BP 135/78 (BP Location: Right Arm)   Pulse 67   Temp 97.9 F (36.6 C) (Oral)   Resp 15   SpO2 100%   Physical Exam Vitals and nursing  note reviewed.  Constitutional:      General: He is not in acute distress.    Appearance: Normal appearance. He is well-developed. He is not ill-appearing.     Comments: Well appearing. Speaks limited English  HENT:     Head: Normocephalic and atraumatic.     Right Ear: Tympanic membrane normal.     Left Ear: Tympanic membrane normal.     Nose: Rhinorrhea present.     Mouth/Throat:     Mouth: Mucous membranes are moist.  Eyes:     General: No scleral icterus.       Right eye: No discharge.  Left eye: No discharge.     Conjunctiva/sclera: Conjunctivae normal.     Pupils: Pupils are equal, round, and reactive to light.  Cardiovascular:     Rate and Rhythm: Normal rate and regular rhythm.  Pulmonary:     Effort: Pulmonary effort is normal. No respiratory distress.     Breath sounds: Normal breath sounds.  Abdominal:     General: There is no distension.  Musculoskeletal:     Cervical back: Normal range of motion.  Skin:    General: Skin is warm and dry.  Neurological:     Mental Status: He is alert and oriented to person, place, and time.  Psychiatric:        Behavior: Behavior normal.     ED Results / Procedures / Treatments   Labs (all labs ordered are listed, but only abnormal results are displayed) Labs Reviewed  COMPREHENSIVE METABOLIC PANEL - Abnormal; Notable for the following components:      Result Value   Creatinine, Ser 1.42 (*)    GFR calc non Af Amer 46 (*)    GFR calc Af Amer 54 (*)    All other components within normal limits  CBC WITH DIFFERENTIAL/PLATELET - Abnormal; Notable for the following components:   WBC 3.9 (*)    Hemoglobin 12.8 (*)    All other components within normal limits  RESPIRATORY PANEL BY RT PCR (FLU A&B, COVID)  POC SARS CORONAVIRUS 2 AG -  ED    EKG EKG Interpretation  Date/Time:  Sunday November 09 2019 14:00:38 EST Ventricular Rate:  65 PR Interval:    QRS Duration: 145 QT Interval:  443 QTC Calculation: 461 R  Axis:   -135 Text Interpretation: Sinus or ectopic atrial rhythm Prolonged PR interval Right bundle branch block When compared to prior, no significant changes seen. No STEMI Confirmed by Tegeler, Chris ([id number]) on 11/09/2019 3:15:31 PM   Radiology DG Chest Port 1 View  Result Date: 11/09/2019 CLINICAL DATA:  Cough.  Sneezing. EXAM: PORTABLE CHEST 1 VIEW COMPARISON:  Mar 21, 2018 FINDINGS: The heart, hila, and mediastinum are normal. No pneumothorax. No nodules or masses. Mild bibasilar atelectasis. IMPRESSION: Mild bibasilar atelectasis.  No suspicious infiltrates. Electronically Signed   By: David  Williams III M.D   On: 11/09/2019 14:10    Procedures Procedures (including critical care time)  Medications Ordered in ED Medications - No data to display  ED Course  I have reviewed the triage vital signs and the nursing notes.  Pertinent labs & imaging results that were available during my care of the patient were reviewed by me and considered in my medical decision making (see chart for details).    MDM Rules/Calculators/A&P                      78  year old male with URI symptoms for 1-2 weeks. His vitals are reassuring and he looks overall well appearing. Exam is consistent with viral illness. Rapid COVID test here is negative. PCR test was obtained is negative as well. Labs were obtained which showed CKD which is at baseline and mild leukopenia which could be from viral illness. EKG is SR with prolonged PR and CXR is clear. Will d/c home with supportive care. He is taking Advil Cold and Flu and was advised to stop this medicine due to his kidney disease. We discussed alternative therapies that are safe for him. He is scheduled to see a Nephrologist on 1/12.   Final Clinical  Impression(s) / ED Diagnoses Final diagnoses:  Viral illness    Rx / DC Orders ED Discharge Orders    None       Recardo Evangelist, PA-C 11/11/19 1035    Tegeler, Gwenyth Allegra, MD 11/11/19 2350

## 2019-11-09 NOTE — ED Triage Notes (Signed)
Onset x 4-5 days Sneezing, cough, runny nose, dizziness and headache.   Lives with family and several family members has runny nose.

## 2019-11-13 ENCOUNTER — Other Ambulatory Visit: Payer: Medicare Other

## 2019-11-22 ENCOUNTER — Encounter (HOSPITAL_COMMUNITY): Payer: Self-pay | Admitting: Emergency Medicine

## 2019-11-22 ENCOUNTER — Observation Stay (HOSPITAL_COMMUNITY)
Admission: EM | Admit: 2019-11-22 | Discharge: 2019-11-23 | Disposition: A | Discharge status: Home / Self Care | Payer: Medicare Other | Attending: Family Medicine | Admitting: Family Medicine

## 2019-11-22 ENCOUNTER — Other Ambulatory Visit: Payer: Self-pay

## 2019-11-22 ENCOUNTER — Emergency Department (HOSPITAL_COMMUNITY): Payer: Medicare Other

## 2019-11-22 DIAGNOSIS — R110 Nausea: Secondary | ICD-10-CM | POA: Insufficient documentation

## 2019-11-22 DIAGNOSIS — U071 COVID-19: Secondary | ICD-10-CM | POA: Diagnosis not present

## 2019-11-22 DIAGNOSIS — R109 Unspecified abdominal pain: Secondary | ICD-10-CM

## 2019-11-22 DIAGNOSIS — G4733 Obstructive sleep apnea (adult) (pediatric): Secondary | ICD-10-CM | POA: Insufficient documentation

## 2019-11-22 DIAGNOSIS — R531 Weakness: Secondary | ICD-10-CM | POA: Insufficient documentation

## 2019-11-22 DIAGNOSIS — R519 Headache, unspecified: Secondary | ICD-10-CM | POA: Insufficient documentation

## 2019-11-22 DIAGNOSIS — E785 Hyperlipidemia, unspecified: Secondary | ICD-10-CM | POA: Insufficient documentation

## 2019-11-22 DIAGNOSIS — I129 Hypertensive chronic kidney disease with stage 1 through stage 4 chronic kidney disease, or unspecified chronic kidney disease: Secondary | ICD-10-CM | POA: Diagnosis not present

## 2019-11-22 DIAGNOSIS — K219 Gastro-esophageal reflux disease without esophagitis: Secondary | ICD-10-CM | POA: Insufficient documentation

## 2019-11-22 DIAGNOSIS — Z20822 Contact with and (suspected) exposure to covid-19: Secondary | ICD-10-CM | POA: Insufficient documentation

## 2019-11-22 DIAGNOSIS — N189 Chronic kidney disease, unspecified: Secondary | ICD-10-CM | POA: Diagnosis not present

## 2019-11-22 DIAGNOSIS — Z79899 Other long term (current) drug therapy: Secondary | ICD-10-CM | POA: Diagnosis not present

## 2019-11-22 DIAGNOSIS — G8929 Other chronic pain: Secondary | ICD-10-CM | POA: Diagnosis not present

## 2019-11-22 DIAGNOSIS — K625 Hemorrhage of anus and rectum: Secondary | ICD-10-CM | POA: Diagnosis present

## 2019-11-22 DIAGNOSIS — N400 Enlarged prostate without lower urinary tract symptoms: Secondary | ICD-10-CM | POA: Diagnosis not present

## 2019-11-22 DIAGNOSIS — F411 Generalized anxiety disorder: Secondary | ICD-10-CM | POA: Diagnosis not present

## 2019-11-22 DIAGNOSIS — K922 Gastrointestinal hemorrhage, unspecified: Secondary | ICD-10-CM

## 2019-11-22 DIAGNOSIS — R0989 Other specified symptoms and signs involving the circulatory and respiratory systems: Secondary | ICD-10-CM | POA: Insufficient documentation

## 2019-11-22 DIAGNOSIS — R300 Dysuria: Secondary | ICD-10-CM | POA: Insufficient documentation

## 2019-11-22 LAB — LACTATE DEHYDROGENASE: LDH: 204 U/L — ABNORMAL HIGH (ref 98–192)

## 2019-11-22 LAB — URINALYSIS, ROUTINE W REFLEX MICROSCOPIC
Bilirubin Urine: NEGATIVE
Glucose, UA: NEGATIVE mg/dL
Ketones, ur: NEGATIVE mg/dL
Leukocytes,Ua: NEGATIVE
Nitrite: NEGATIVE
Protein, ur: NEGATIVE mg/dL
Specific Gravity, Urine: 1.012 (ref 1.005–1.030)
pH: 6 (ref 5.0–8.0)

## 2019-11-22 LAB — CBC WITH DIFFERENTIAL/PLATELET
Abs Immature Granulocytes: 0.02 10*3/uL (ref 0.00–0.07)
Basophils Absolute: 0 10*3/uL (ref 0.0–0.1)
Basophils Relative: 0 %
Eosinophils Absolute: 0 10*3/uL (ref 0.0–0.5)
Eosinophils Relative: 0 %
HCT: 38.1 % — ABNORMAL LOW (ref 39.0–52.0)
Hemoglobin: 12.5 g/dL — ABNORMAL LOW (ref 13.0–17.0)
Immature Granulocytes: 0 %
Lymphocytes Relative: 24 %
Lymphs Abs: 1.3 10*3/uL (ref 0.7–4.0)
MCH: 26.8 pg (ref 26.0–34.0)
MCHC: 32.8 g/dL (ref 30.0–36.0)
MCV: 81.6 fL (ref 80.0–100.0)
Monocytes Absolute: 0.4 10*3/uL (ref 0.1–1.0)
Monocytes Relative: 7 %
Neutro Abs: 3.6 10*3/uL (ref 1.7–7.7)
Neutrophils Relative %: 69 %
Platelets: 161 10*3/uL (ref 150–400)
RBC: 4.67 MIL/uL (ref 4.22–5.81)
RDW: 13.9 % (ref 11.5–15.5)
WBC: 5.2 10*3/uL (ref 4.0–10.5)
nRBC: 0 % (ref 0.0–0.2)

## 2019-11-22 LAB — COMPREHENSIVE METABOLIC PANEL
ALT: 25 U/L (ref 0–44)
AST: 29 U/L (ref 15–41)
Albumin: 3.4 g/dL — ABNORMAL LOW (ref 3.5–5.0)
Alkaline Phosphatase: 52 U/L (ref 38–126)
Anion gap: 11 (ref 5–15)
BUN: 16 mg/dL (ref 8–23)
CO2: 18 mmol/L — ABNORMAL LOW (ref 22–32)
Calcium: 8.1 mg/dL — ABNORMAL LOW (ref 8.9–10.3)
Chloride: 101 mmol/L (ref 98–111)
Creatinine, Ser: 1.5 mg/dL — ABNORMAL HIGH (ref 0.61–1.24)
GFR calc Af Amer: 50 mL/min — ABNORMAL LOW (ref >60)
GFR calc non Af Amer: 43 mL/min — ABNORMAL LOW (ref >60)
Glucose, Bld: 127 mg/dL — ABNORMAL HIGH (ref 70–99)
Potassium: 4 mmol/L (ref 3.5–5.1)
Sodium: 130 mmol/L — ABNORMAL LOW (ref 135–145)
Total Bilirubin: 0.9 mg/dL (ref 0.3–1.2)
Total Protein: 7.4 g/dL (ref 6.5–8.1)

## 2019-11-22 LAB — TROPONIN I (HIGH SENSITIVITY): Troponin I (High Sensitivity): 15 ng/L (ref <18)

## 2019-11-22 LAB — POC SARS CORONAVIRUS 2 AG -  ED: SARS Coronavirus 2 Ag: POSITIVE — AB

## 2019-11-22 LAB — FERRITIN: Ferritin: 452 ng/mL — ABNORMAL HIGH (ref 24–336)

## 2019-11-22 LAB — D-DIMER, QUANTITATIVE: D-Dimer, Quant: 0.94 ug/mL-FEU — ABNORMAL HIGH (ref 0.00–0.50)

## 2019-11-22 LAB — FIBRINOGEN: Fibrinogen: 571 mg/dL — ABNORMAL HIGH (ref 210–475)

## 2019-11-22 LAB — POC OCCULT BLOOD, ED: Fecal Occult Bld: POSITIVE — AB

## 2019-11-22 LAB — LIPASE, BLOOD: Lipase: 29 U/L (ref 11–51)

## 2019-11-22 LAB — C-REACTIVE PROTEIN: CRP: 8.8 mg/dL — ABNORMAL HIGH (ref <1.0)

## 2019-11-22 MED ORDER — ACETAMINOPHEN 650 MG RE SUPP
650.0000 mg | Freq: Four times a day (QID) | RECTAL | Status: DC | PRN
  Administered 2019-11-23: 650 mg via RECTAL

## 2019-11-22 MED ORDER — FAMOTIDINE 20 MG PO TABS
20.0000 mg | ORAL_TABLET | Freq: Two times a day (BID) | ORAL | Status: DC
  Administered 2019-11-22 – 2019-11-23 (×2): 20 mg via ORAL
  Filled 2019-11-22 (×2): qty 1

## 2019-11-22 MED ORDER — ONDANSETRON HCL 4 MG/2ML IJ SOLN: 4.0000 mg | Freq: Four times a day (QID) | INTRAMUSCULAR | Status: DC | PRN

## 2019-11-22 MED ORDER — ONDANSETRON HCL 4 MG PO TABS: 4.0000 mg | ORAL_TABLET | Freq: Four times a day (QID) | ORAL | Status: DC | PRN

## 2019-11-22 MED ORDER — FINASTERIDE 5 MG PO TABS
5.0000 mg | ORAL_TABLET | Freq: Every day | ORAL | Status: DC
  Administered 2019-11-23: 5 mg via ORAL
  Filled 2019-11-22 (×2): qty 1

## 2019-11-22 MED ORDER — ATORVASTATIN CALCIUM 40 MG PO TABS
40.0000 mg | ORAL_TABLET | Freq: Every day | ORAL | Status: DC
  Filled 2019-11-22: qty 1

## 2019-11-22 MED ORDER — ACETAMINOPHEN 325 MG PO TABS
650.0000 mg | ORAL_TABLET | Freq: Four times a day (QID) | ORAL | Status: DC | PRN
  Filled 2019-11-22: qty 2

## 2019-11-22 MED ORDER — LISINOPRIL 10 MG PO TABS
5.0000 mg | ORAL_TABLET | Freq: Every day | ORAL | Status: DC
  Administered 2019-11-22: 22:00:00 5 mg via ORAL
  Filled 2019-11-22: qty 1

## 2019-11-22 MED ORDER — ATORVASTATIN CALCIUM 40 MG PO TABS
40.0000 mg | ORAL_TABLET | Freq: Every day | ORAL | Status: DC
  Administered 2019-11-23: 40 mg via ORAL
  Filled 2019-11-22: qty 1

## 2019-11-22 MED ORDER — TAMSULOSIN HCL 0.4 MG PO CAPS
0.8000 mg | ORAL_CAPSULE | Freq: Every day | ORAL | Status: DC
  Administered 2019-11-23: 0.8 mg via ORAL
  Filled 2019-11-22: qty 2

## 2019-11-22 MED ORDER — SODIUM CHLORIDE 0.9 % IV BOLUS
1000.0000 mL | Freq: Once | INTRAVENOUS | Status: AC
  Administered 2019-11-22: 13:00:00 1000 mL via INTRAVENOUS

## 2019-11-22 MED ORDER — SODIUM CHLORIDE 0.9 % IV SOLN: INTRAVENOUS | Status: DC

## 2019-11-22 MED ORDER — LORATADINE 10 MG PO TABS
10.0000 mg | ORAL_TABLET | Freq: Every day | ORAL | Status: DC
  Administered 2019-11-23: 10 mg via ORAL
  Filled 2019-11-22: qty 1

## 2019-11-22 MED ORDER — ENOXAPARIN SODIUM 40 MG/0.4ML ~~LOC~~ SOLN
40.0000 mg | SUBCUTANEOUS | Status: DC
  Administered 2019-11-22: 40 mg via SUBCUTANEOUS
  Filled 2019-11-22: qty 0.4

## 2019-11-22 NOTE — H&P (Addendum)
[facility] Admission History and Physical Service Pager: [phone]  Patient name: Omar Sanders record number: [id number] Date of birth: 03-17-1941 Age: 79 y.o. Gender: male  Primary Care Provider: Caroline More, DO Consultants: None Code Status: Full code Preferred Emergency Contact: Cil Sinh, patient's daughter  Chief Complaint: Cough/congestion/mylagias, BRBPR  Assessment and Plan: [name] is a 79 y.o. male presenting with rectal bleeding and URI symptoms concerning for COVID, found to be COVID positive. PMH is significant for seizures, OSA, right bundle branch block, anal fissure, grains, BPH, hypertension, hyperlipidemia, headache, GERD, chronic pain, anxiety.   COVID-19 Illness: Stable.  Patient tested positive for covid on POC antigen test tonight 1/23. Patient recently tested negative for Covid on 11/09/2019 but continued to experience runny nose myalgias and now has nausea but with no vomiting.  From a respiratory perspective patient has no complaints but reports intermittent cough.  On exam patient has some crackles at the right lower lobe base but remained stable on room air.  D-dimer noted to be elevated at 0.94, CRP 8.8, LDH 204.  Chest x-ray with no active pulmonary disease noted.  Labs are not significant for leukocytosis and patient is afebrile on initial presentation.  Will admit for observation as patient has had persistent symptoms and is now found to be Covid positive.  Consider this mild case as patient does not have oxygen requirement. Persistent myalgias, congestion, HA, and non-productive cough since approximately 1/7. Now on day 16 of illness, suspect initial COVID was a false negative given symptoms/lab evidence consistent with COVID infection. Low concern for concurrent influenza with negative test on 1/10 and prolonged symptoms. Additionally, while he has had some lower blood pressures (responded well to IVF), suspect this is in  the setting of hypovolemia 2/2 poor appetite with current illness, reassuringly otherwise hemodynamically stable.  -Admit for observation, MedSurg, attending Dr. Ardelia Mems -Monitor respiratory status with vitals every 4 hours -Will not start remdesivir or Decadron at this time as patient is not requiring supplemental O2 -Monitor fibrinogen, CRP, LDH, D-dimer, ferritin -Continue airborne consult precautions -Tylenol 650mg   -PT/OT -Vitals per floor  Rectal Bleeding  Patient presents with one episode of rectal bleeding. Patient reports seeing bright red blood on his tissue paper while wiping after bowel movement this morning.  Patient denies recent history of melena, diarrhea, constipation.  Patient noted to have history of anal fissures, hemorrhoids as well as IBS.  Labs on admission notable for hemoglobin 12.5 and positive FOBT. Last colonoscopy/EGD in 2018 noted to have polyps consistent with fragments of tubular adenoma and no high-grade dysplasia or malignancy and grade A esophagitis.  Upon rectal exam, patient noted to have minimal amount of excessive anal tissue with 1 area concerning for anal fissure but this area is nontender to palpation.  No active external hemorrhoids noted on exam and no new/old blood noted on rectal exam.  It is highly likely that this is due to internal hemorrhoids or constipation given patient has IBS. With stable hemoglobin and no history of melena or hematochezia, will monitor. Differential additionally including possible non-visualized anal fissure on limited rectal exam, brief bleeding from small superficial rectal skin tear, or divertular bleed. With clinical history and stable hemoglobin at baseline, have low suspicion for active GI bleeding, much Sanders suspect self-limited rectal source.  -Given concurrent Covid diagnosis and hypercoagulability associated with this, opted for DVT prophylaxis given low concern for active GI bleed.  However, will obtain a CBC tonight to  ensure stability, if significantly lower will transition to SCDs.  -Continue home famotidine 20 mg BID, patient has an unclear allergy to PPIs -Monitor CBC   Dysuria Patient reports 2 weeks of burning with urination.  U/A notable for no leukocytes nor nitrates but includes rare bacteria and a small  hemoglobin.  Given dysuria only without additional typical UTI symptoms, will check urine culture and decide on antibiotic therapy based on results.  Patient is afebrile with no leukocytosis on admission.  Alternative etiology for dysuria could be due to external irritation to the penile skin, concentrated urine due to dehydration.  Considered STI, however patient reports no sexual activity for the past 6-7 years.  Doubt prostatitis without additional symptoms suggestive of this. -Follow-up urine culture -Maintenance IV fluids   Irritable bowel syndrome, constipation type  nausea: Chronic, stable. Patient presents with abdominal pain in the setting of history of irritable bowel syndrome.  Patient states that pain is diffuse in his abdomen but not Sanders severe or frequent than his normal IBS symptoms.  Patient states that he has been able to have bowel movements and has noticed some blood in 1 bowel movement today.  Patient also reports being nauseous but denies having had vomited.  Vague abdominal cramping/bloating is at baseline, has been present for years. Has not seen GI since 2018, believe this would be of great benefit to him. Suspect our language barrier has complicated his care, note that he has tried numerous different medications for his IBS but is often confused on how to take them. -Zofran as needed -Recommend GI follow-up on discharge -Encourage food diary conversation in efforts to avoid triggers -MiraLax daily  CKD: Stable. Has creatinine elevated at 1.5, upon chart review it appears this is around patient's baseline.  Follows with nephrology outpatient. -Maintenance IV fluids at 125 mils  per hour -We will monitor creatinine with BMP  Hypertension Home medications include lisinopril 5 mg. BP on admission with systolic ranged from [id number].  In the ED, had some concern for lower blood pressure of [id number] and diastolic of 56 earlier today.  Status post 1 L bolus in the ED. we will continue IV fluids overnight. -We will monitor blood pressure with vitals -IV fluids normal saline at 137mL/hr  -Hold home lisinopril while closely monitoring blood pressure, likely to restart tomorrow, 1/24  Hyperlipidemia Patient home meds include atorvastatin 40. -We will continue home atorvastatin  BPH Patient home meds include finasteride 5 mg and Flomax daily.  -We will continue patient's home finasteride and flomax   GERD  Home medications include Pepcid 20 mg BID   Seasonal allergies Patient home meds include Zyrtec 10 mg, Flonase 50  FEN/GI: Carb modified  Prophylaxis: lovenox   Disposition: Admit to MedSurg, potential d/c tomorrow if respiratory/hematology stable.   History of Present Illness:  Omar Sanders is a 79 y.o. male presenting with rectal bleeding for one episode and concern for Covid symptoms.  Past medical history significant for chronic pain, migraines, irritable bowel syndrome, hypertension, hyperlipidemia, GERD and BPH.  Mr. Godlewski reports that this is his 2nd time reporting to the ED for symptoms including runny nose, myalgias, headaches and general malaise.  Patient states that on the 6/7th of Jan he started experiencing these symptoms, and was evaluated in the ED on the 10th, tested negative for COVID and flu. Patient states that the symptoms have persisted and adds that he sometimes feels a little short of breath, but not often.  Patient also states that  he feels short of breath Sanders when he lays on his right side and can't seem to get comfortable at night.  He states that he also has a non-productive cough (but also wonders if it's from his GERD).  And also reports  waking up one morning sweaty and hot but notes that he had on multiple blankets and layers of clothing so is unsure if this was a true fever or not.  Patient also states that he has had several uncomfortable chills.  Patient reports having a little nausea, denies vomiting.  Patient also denies diarrhea.  Patient reports noticing an "average" amount of bright red blood on the tissue after wiping from a normal bowel movement.  Reports there was also a few drops of blood in the toilet but is not sure how much.  This episode occurred this morning and only happen 1 time today.  Patient also endorses abdominal pain throughout the day in addition to experiencing burning when he urinates.  Patient adds that he is urinating less due to not drinking/eating as much due to decreased appetite.  The burning with urination has been present for the past two weeks and started after the URI symptoms.  Patient has not sexually active for the past 6-7 years.     Patient states that he has been experiencing cramping and bloating abdominal pain for years, this current pain is no Sanders severe or frequent than his chronic pain however is very frustrated it is not been figured out by now.   ED Course: On arrival, he was hemodynamically stable. CXR without active cardiopulmonary disease. Hgb 12.5. FOBT positive on brown stool. His blood pressure was noted to intermittent low in the [id number] systolic with question on GI bleeding and patient's concern for symptoms (lives alone), thus ED provider requested overnight observation. During admission evaluation, ordered rapid COVID due to symptoms, returned positive.   Review Of Systems: Per HPI with the following additions:   Review of Systems  Constitutional: Positive for chills. Negative for fever.  HENT: Positive for congestion.   Respiratory: Positive for cough.   Gastrointestinal: Positive for abdominal pain, blood in stool, heartburn and nausea. Negative for diarrhea, melena and  vomiting.  Genitourinary: Positive for dysuria. Negative for hematuria.  Neurological: Positive for dizziness.    Patient Active Problem List   Diagnosis Date Noted  . BRBPR (bright red blood per rectum) 11/22/2019  . Rotator cuff disorder, left 07/28/2019  . Dark stools 07/16/2019  . 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 Venous stasis dermatitis of both lower extremities 06/11/2018  . Aortic aneurysm ([place]) 03/26/2018  . New onset right bundle branch block (RBBB)   . Diabetes ([place]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  . Hyperlipidemia 06/14/2016  . Chronic kidney disease 01/19/2016  . BPH (benign prostatic hyperplasia) 05/06/2015  . Generalized anxiety disorder 12/29/2014  . Anemia 10/31/2012  . GERD 05/19/2010  . IBS (irritable bowel syndrome) 05/19/2010  . PERSONAL HX COLONIC POLYPS 05/19/2010  . Essential hypertension 04/10/2007  . Seasonal allergies 04/10/2007  . HIATAL HERNIA 04/10/2007    Past Medical History: Past Medical History:  Diagnosis Date  . Allergy   . Anal fissure   . Anemia   . Anxiety   . Benign prostatic hyperplasia   . Bilateral lower extremity edema 03/22/2018  . Chest pain 02/2018  . Chronic pain   . Colon polyp   . GERD (gastroesophageal reflux disease)   . Headache(784.0)   . Hemorrhoids   . Hyperlipidemia    Hx: of  .  Hypertension   . IBS (irritable bowel syndrome)   . Incontinent of urine    Hx:of  . Migraines   . Neuromuscular disorder ([place])    periferal neuropathy per patient  . New onset right bundle branch block (RBBB)   . OSA (obstructive sleep apnea) 11/2009   sleep study with mild OSA, pt unwilling to use CPAP  . Seizures ([place])     Past Surgical History: Past Surgical History:  Procedure Laterality Date  . 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 CATARACT EXTRACTION     bilat  . CHOLECYSTECTOMY  2008  . COLONOSCOPY  2012   Dr Collene Mares  . HEMORRHOID SURGERY     anal surgery  . INGUINAL  HERNIA REPAIR Bilateral 03/11/2013   Procedure: LAPAROSCOPIC BILATERAL INGUINAL HERNIA REPAIR;  Surgeon: Ralene Ok, MD;  Location: [facility];  Service: General;  Laterality: Bilateral;  . INSERTION OF MESH Bilateral 03/11/2013   Procedure: INSERTION OF MESH;  Surgeon: Ralene Ok, MD;  Location: [facility];  Service: General;  Laterality: Bilateral;  . NM MYOCAR PERF WALL MOTION  05/27/2009   mild iscemia mid inferio & apical regions.  . NM MYOCAR PERF WALL MOTION  11/01/2012   negative for ischemia  . US ECHOCARDIOGRAPHY  05/27/2009   mild mitral annular ca+,AOV mildly sclerotic    Social History: Social History   Tobacco Use  . Smoking status: Never Smoker  . Smokeless tobacco: Never Used  Substance Use Topics  . Alcohol use: No  . Drug use: No    Family History: Family History  Problem Relation Age of Onset  . Hypertension Mother   . Colon cancer Neg Hx     Allergies and Medications: Allergies  Allergen Reactions  . Aspirin Other (See Comments)    Abdominal pain   . Lubiprostone   . Pantoprazole   . Vitamin C     Unknown   No current facility-administered medications on file prior to encounter.   Current Outpatient Medications on File Prior to Encounter  Medication Sig Dispense Refill  . atorvastatin (LIPITOR) 40 MG tablet Take 1 tablet (40 mg total) by mouth daily. 90 tablet 3  . baclofen (LIORESAL) 10 MG tablet Take 10 mg by mouth 3 (three) times daily.    . cetirizine (ZYRTEC) 10 MG tablet Take 1 tablet (10 mg total) by mouth daily. 30 tablet 11  . famotidine (PEPCID) 10 MG tablet Take 1 tablet (10 mg total) by mouth daily. 30 tablet 3  . famotidine (PEPCID) 20 MG tablet TAKE 1 TABLET(20 MG) BY MOUTH TWICE DAILY 180 tablet 3  . fentaNYL (DURAGESIC) 25 MCG/HR Place 1 patch onto the skin every 3 (three) days.    . finasteride (PROSCAR) 5 MG tablet Take 1 tablet (5 mg total) by mouth daily. 30 tablet 5  . fluticasone (FLONASE) 50 MCG/ACT nasal spray Place 2 sprays  into both nostrils daily. 16 g 6  . halobetasol (ULTRAVATE) 0.05 % cream Apply topically 2 (two) times daily. 50 g 1  . HYDROcodone-acetaminophen (NORCO/VICODIN) 5-325 MG tablet Take 1 tablet by mouth every 6 (six) hours as needed for moderate pain.    Marland Kitchen lisinopril (ZESTRIL) 5 MG tablet Take 1 tablet (5 mg total) by mouth at bedtime. 90 tablet 3  . loratadine (CLARITIN) 10 MG tablet Take 1 tablet (10 mg total) by mouth daily. 30 tablet 11  . nitroGLYCERIN (NITROSTAT) 0.3 MG SL tablet Place 0.3  mg under the tongue every 5 (five) minutes as needed for chest pain.    Marland Kitchen Olopatadine HCl 0.2 % SOLN Apply 1 drop to eye daily. 2.5 mL 5  . tamsulosin (FLOMAX) 0.4 MG CAPS capsule Take 2 capsules (0.8 mg total) by mouth daily after breakfast. 60 capsule 3  . tizanidine (ZANAFLEX) 2 MG capsule Take 2 mg by mouth 3 (three) times daily.      Objective: BP 133/64   Pulse (!) 59   Temp 98.2 F (36.8 C) (Oral)   Resp 17   SpO2 95%   Exam: General: Male appearing younger than stated age lying in bed in no acute distress Eyes: No scleral icterus, extraocular muscles intact bilaterally Cardiovascular: Regular rate and rhythm with no murmurs appreciated Respiratory: Right lower lobe crackles, patient stable on room air, no wheezing, no retractions appreciated Gastrointestinal: Abdomen with generalized tenderness, abdomen is soft, occasional bowel sounds MSK: Moves all extremities, no deformities noted Rectal: external visualization only, noted excessive rectal tissue at opening, small 0.5cm superficial skin tear at 7 oclock position without any bleeding noted.  Derm: Skin is warm dry and intact Neuro: Patient is alert and oriented x4  Labs and Imaging: CBC BMET  Recent Labs  Lab 11/22/19 1148  WBC 5.2  HGB 12.5*  HCT 38.1*  PLT 161   Recent Labs  Lab 11/22/19 1148  NA 130*  K 4.0  CL 101  CO2 18*  BUN 16  CREATININE 1.50*  GLUCOSE 127*  CALCIUM 8.1*     EKG:  Patient with hx of right  bundle branch block   Stark Klein, MD 11/22/2019, 8:49 PM PGY-1, [facility] Intern pager: [phone], text pages welcome  FPTS Upper-Level Resident Addendum   I have independently interviewed and examined the patient. I have discussed the above with the original author and agree with their documentation. My edits for correction/addition/clarification are in green. Please see also any attending notes.    Patriciaann Clan, DO  Family Medicine PGY-2

## 2019-11-22 NOTE — ED Provider Notes (Addendum)
MSE was initiated and I personally evaluated the patient and placed orders (if any) at  12:08 PM on November 22, 2019.  The patient appears stable so that the remainder of the MSE may be completed by another provider.    Chief Complaint: Rectal bleeding, epigastric pain, body aches, cough  HPI:   79 year old male with a past medical history of CKD, diabetes, IBS, GERD, hypertension presenting to the ED with multiple complaints.  He complains of 2-week history of intermittent shortness of breath, cough and body aches.  Symptoms got worse this morning.  Also complains of epigastric abdominal pain and one episode of bright red blood per rectum this morning.  Denies history of rectal bleeding in the past.  He has tried taking Tylenol Cold and flu and other over-the-counter medications with no significant improvement in his body aches.  He was seen and evaluated 2 weeks ago with negative Covid test and reassuring work-up.  States that his symptoms have gradually worsened.  ROS: + Rectal bleeding, abdominal pain, myalgias  Physical Exam:   Gen: No distress  Neuro: Awake and Alert  Skin: Warm    Focused Exam: Rectal exam revealed grossly heme negative stool.  No tenderness to palpation during DRE.  Lungs are clear to auscultation bilaterally.  Abdomen is tender in the epigastric and periumbilical area.   Initiation of care has begun. The patient has been counseled on the process, plan, and necessity for staying for the completion/evaluation, and the remainder of the medical screening examination     Delia Heady, PA-C 11/22/19 1233    Long, [facility], MD 11/22/19 [id number]

## 2019-11-22 NOTE — Discharge Summary (Addendum)
[facility] Discharge Summary  Patient name: Omar Sanders record number: [id number] Date of birth: Oct 15, 1941 Age: 79 y.o. Gender: male Date of Admission: 11/22/2019  Date of Discharge: 11/23/2019 Admitting Physician: Stark Klein, MD  Primary Care Provider: Caroline More, DO Consultants: None  Indication for Hospitalization: Rectal bleeding and 19  Discharge Diagnoses/Problem List:  Patient Active Problem List   Diagnosis Date Noted  . BRBPR (bright red blood per rectum) 11/22/2019  . Rotator cuff disorder, left 07/28/2019  . Dark stools 07/16/2019  . 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 Venous stasis dermatitis of both lower extremities 06/11/2018  . Aortic aneurysm ([place]) 03/26/2018  . New onset right bundle branch block (RBBB)   . Diabetes ([place]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  . Hyperlipidemia 06/14/2016  . Chronic kidney disease 01/19/2016  . BPH (benign prostatic hyperplasia) 05/06/2015  . Generalized anxiety disorder 12/29/2014  . Anemia 10/31/2012  . GERD 05/19/2010  . IBS (irritable bowel syndrome) 05/19/2010  . PERSONAL HX COLONIC POLYPS 05/19/2010  . Essential hypertension 04/10/2007  . Seasonal allergies 04/10/2007  . HIATAL HERNIA 04/10/2007  Dizziness   Disposition: HHPT  Discharge Condition: Improved and stable  Discharge Exam:  General: NAD, well appearing CVS: RRR, no MRG Lungs: CTAB, no increased work of breathing Abdomen: Soft, nontender, nondistended MSK: No lower extremity edema, 2+ dp Neuro: A&Ox3. Moves all extremities spontaneously  Brief Hospital Course:   TEJAY Sanders is a 79 y.o. male who presented with roughly 2 weeks of runny nose, myalgias in addition to dysuria for 2 weeks and rectal bleeding for 1 day.    COVID-19 infection Patient found to be Covid positive upon admission testing.  Patient reported experiencing symptoms of runny nose, myalgias and intermittent cough for about 2 weeks.  Patient had tested  negative for Covid on 1/10 but represented on 1/23 as his symptoms did not resolve at home.  Patient has been stable on room air and denies any shortness of breath or dyspnea.  Patient does report that his difficulty breathing he lies down.   Dysuria Patient reported 2 weeks of burning with urination.  UCx <10k growth insignificant finding. Denied dysuria on day of discharge.    Rectal Bleeding  Patient presents with one episode of rectal bleeding. Patient reports seeing bright red blood on his tissue paper while wiping after bowel movement.  Patient denies history of melena, diarrhea, constipation.  Patient noted to have history of anal fissures, hemorrhoids as well as irritable bowel syndrome.  Labs on admission notable for hemoglobin 12.5 and positive FOBT. Last colonoscopy noted to have polyps consistent with fragments of tubular adenoma and no high-grade dysplasia or malignancy as of 2018.  Upon rectal exam, patient noted to have minimal amount of excessive anal tissue with 1 area concerning for anal fissure but this area is nontender to palpation.  No active external hemorrhoids noted on exam and no new/old blood noted on rectal exam.  It is highly likely that this is due to internal hemorrhoids or constipation given patient has IBS. With stable hemoglobin and no history of melena or hematochezia, will monitor. Hg 11.6 on day of discharge, but all cell lines down trending. Did not endorse any more bloody BM in hospital.  Irritable bowel syndrome  nausea Patient presents with abdominal pain in the setting of history of irritable bowel syndrome.  Patient states that pain is diffuse in his abdomen but not more severe or frequent than his normal IBS symptoms.  Patient states  that he has been able to have bowel movements and has noticed some blood in 1 bowel movement today.  Patient also reports being nauseous but denies having had vomited. -Zofran as needed  AKI v CKD  Has creatinine elevated  at 1.5, upon chart review it appears this is around patient's baseline.  In his case patient will need to be evaluated for CKD.  -Maintenance IV fluids at 125 mils per hour -We will monitor creatinine with BMP -Recommend outpatient follow-up of creatinine  Hypertension Home medications include lisinopril 5 mg. BP on admission with systolic ranged from [id number].  In the ED, had some concern for lower blood pressure of [id number] and diastolic of 56 earlier today.  Status post 1 L bolus in the ED. we will continue IV fluids overnight. -We will monitor blood pressure with vitals -IV fluids normal saline at 131mL/hr  -lisinopril 5 mg   Chronic pain Home medications include baclofen 10 mg 3 times daily, fentanyl 25 patch 3 times daily, Norco 5-325  every 6 hours as needed for moderate pain and Zanaflex 2 mg 3 times daily.  Hyperlipidemia Patient home meds include atorvastatin 40. -We will continue home atorvastatin  BPH Patient home meds include finasteride 5 mg and Flomax 0.4 mg daily.  -We will continue patient's home finasteride and flomax   GERD  Home medications include Pepcid 20 mg  Seasonal allergies Patient home meds include Zyrtec 10 mg, Flonase 50, Claritin   Dizziness No focal neurological deficits. Orthostatics were negative. Evaluated by PT. Recommending HHPT.   Issues for Follow Up:  1. Referral  for gastroenterology for patient's IBS and BRBPR 2. CBC in 3 days, lab aware of COVID+ 3. Virtual f/u in 3 days w/ ATC 4. Recommend isolation for 20 days since COVID19, this would be Dec 12, 2019. Patient informed of this prior to discharge.  Significant Procedures: None  Significant Labs and Imaging:  Recent Labs  Lab 11/22/19 1148 11/23/19 0248  WBC 5.2 3.9*  HGB 12.5* 11.6*  HCT 38.1* 34.1*  PLT 161 160   Recent Labs  Lab 11/22/19 1148 11/23/19 0248  NA 130* 137  K 4.0 4.2  CL 101 108  CO2 18* 21*  GLUCOSE 127* 101*  BUN 16 16  CREATININE 1.50* 1.33*   CALCIUM 8.1* 8.2*  ALKPHOS 52  --   AST 29  --   ALT 25  --   ALBUMIN 3.4*  --      No results found.   Results/Tests Pending at Time of Discharge:  Unresulted Labs (From admission, onward)    Start     Ordered   11/29/19 0500  Creatinine, serum  (enoxaparin (LOVENOX)    CrCl >/= 30 ml/min)  Weekly,   R    Comments: while on enoxaparin therapy    11/22/19 2056   11/23/19 0000  CBC  R     11/23/19 1722           Discharge Medications:  Allergies as of 11/23/2019      Reactions   Aspirin Other (See Comments)   Abdominal pain   Lubiprostone    Unknown   Pantoprazole    Unknown   Vitamin C    Unknown      Medication List    STOP taking these medications   alfuzosin 10 MG 24 hr tablet Commonly known as: UROXATRAL   cetirizine 10 MG tablet Commonly known as: ZYRTEC   lisinopril 5 MG tablet Commonly known as: ZESTRIL  nitroGLYCERIN 0.3 MG SL tablet Commonly known as: NITROSTAT     TAKE these medications   acetaminophen 325 MG tablet Commonly known as: TYLENOL Take 2 tablets (650 mg total) by mouth every 6 (six) hours as needed for mild pain (or Fever >/= 101).   atorvastatin 40 MG tablet Commonly known as: LIPITOR Take 1 tablet (40 mg total) by mouth daily.   cephALEXin 500 MG capsule Commonly known as: KEFLEX Take 1 capsule (500 mg total) by mouth every 12 (twelve) hours for 5 days.   cycloSPORINE 0.05 % ophthalmic emulsion Commonly known as: RESTASIS Place 1 drop into both eyes 2 (two) times daily.   famotidine 20 MG tablet Commonly known as: PEPCID TAKE 1 TABLET(20 MG) BY MOUTH TWICE DAILY What changed: Another medication with the same name was removed. Continue taking this medication, and follow the directions you see here.   finasteride 5 MG tablet Commonly known as: PROSCAR Take 1 tablet (5 mg total) by mouth daily.   fluticasone 50 MCG/ACT nasal spray Commonly known as: FLONASE Place 2 sprays into both nostrils daily.    halobetasol 0.05 % cream Commonly known as: ULTRAVATE Apply topically 2 (two) times daily.   loratadine 10 MG tablet Commonly known as: CLARITIN Take 1 tablet (10 mg total) by mouth daily.   Olopatadine HCl 0.2 % Soln Apply 1 drop to eye daily. What changed: how to take this   tamsulosin 0.4 MG Caps capsule Commonly known as: FLOMAX Take 2 capsules (0.8 mg total) by mouth daily after breakfast.            Durable Medical Equipment  (From admission, onward)         Start     Ordered   11/23/19 1644  For home use only DME Tub bench  Once     11/23/19 1643          Discharge Instructions: Please refer to Patient Instructions section of EMR for full details.  Patient was counseled important signs and symptoms that should prompt return to medical care, changes in medications, dietary instructions, activity restrictions, and follow up appointments.   Follow-Up Appointments: Follow-up Information    [facility]. Go on 11/26/2019.   Why: Appointment at 8:30AM. Also need to get CBC at lab.  Contact information: [place]          Bonnita Hollow, MD 11/23/2019, 5:34 PM PGY-3, [place]

## 2019-11-22 NOTE — ED Provider Notes (Addendum)
[facility] EMERGENCY DEPARTMENT Provider Note   CSN: [id number] Arrival date & time: 11/22/19  1016     History Chief Complaint  Patient presents with  . Rectal Bleeding  . Abdominal Pain  . Generalized Body Aches  . Cough    Omar Sanders is a 79 y.o. male.  HPI Patient presents with muscle aches and no blood in the stool.  Seen in the ER 13 days ago with negative Covid test at that time.  Now continues to ache but now more upper abdominal pain and states that he has had blood in the stool.  States it was red blood.  Also at times has chest pain.  No fevers.  Has had cough also.    Past Medical History:  Diagnosis Date  . Allergy   . Anal fissure   . Anemia   . Anxiety   . Benign prostatic hyperplasia   . Bilateral lower extremity edema 03/22/2018  . Chest pain 02/2018  . Chronic pain   . Colon polyp   . GERD (gastroesophageal reflux disease)   . Headache(784.0)   . Hemorrhoids   . Hyperlipidemia    Hx: of  . Hypertension   . IBS (irritable bowel syndrome)   . Incontinent of urine    Hx:of  . Migraines   . Neuromuscular disorder ([place])    periferal neuropathy per patient  . New onset right bundle branch block (RBBB)   . OSA (obstructive sleep apnea) 11/2009   sleep study with mild OSA, pt unwilling to use CPAP  . Seizures [facility])     Patient Active Problem List   Diagnosis Date Noted  . Rotator cuff disorder, left 07/28/2019  . Dark stools 07/16/2019  . 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 Venous stasis dermatitis of both lower extremities 06/11/2018  . Aortic aneurysm ([place]) 03/26/2018  . New onset right bundle branch block (RBBB)   . Diabetes ([place]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  . Hyperlipidemia 06/14/2016  . Chronic kidney disease 01/19/2016  . BPH (benign prostatic hyperplasia) 05/06/2015  . Generalized anxiety disorder 12/29/2014  . Anemia 10/31/2012  . GERD 05/19/2010  . IBS (irritable bowel syndrome) 05/19/2010  . PERSONAL HX COLONIC POLYPS  05/19/2010  . Essential hypertension 04/10/2007  . Seasonal allergies 04/10/2007  . HIATAL HERNIA 04/10/2007    Past Surgical History:  Procedure Laterality Date  . 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 CATARACT EXTRACTION     bilat  . CHOLECYSTECTOMY  2008  . COLONOSCOPY  2012   Dr Collene Mares  . HEMORRHOID SURGERY     anal surgery  . INGUINAL HERNIA REPAIR Bilateral 03/11/2013   Procedure: LAPAROSCOPIC BILATERAL INGUINAL HERNIA REPAIR;  Surgeon: Ralene Ok, MD;  Location: [facility];  Service: General;  Laterality: Bilateral;  . INSERTION OF MESH Bilateral 03/11/2013   Procedure: INSERTION OF MESH;  Surgeon: Ralene Ok, MD;  Location: [facility];  Service: General;  Laterality: Bilateral;  . NM MYOCAR PERF WALL MOTION  05/27/2009   mild iscemia mid inferio & apical regions.  . NM MYOCAR PERF WALL MOTION  11/01/2012   negative for ischemia  . US ECHOCARDIOGRAPHY  05/27/2009   mild mitral annular ca+,AOV mildly sclerotic       Family History  Problem Relation Age of Onset  . Hypertension Mother   . Colon cancer Neg Hx     Social History   Tobacco Use  . Smoking status: Never  Smoker  . Smokeless tobacco: Never Used  Substance Use Topics  . Alcohol use: No  . Drug use: No    Home Medications Prior to Admission medications   Medication Sig Start Date End Date Taking? Authorizing Provider  atorvastatin (LIPITOR) 40 MG tablet Take 1 tablet (40 mg total) by mouth daily. 08/04/19   Caroline More, DO  baclofen (LIORESAL) 10 MG tablet Take 10 mg by mouth 3 (three) times daily.    [provider]  cetirizine (ZYRTEC) 10 MG tablet Take 1 tablet (10 mg total) by mouth daily. 03/11/19   Caroline More, DO  famotidine (PEPCID) 10 MG tablet Take 1 tablet (10 mg total) by mouth daily. 07/15/19   Caroline More, DO  famotidine (PEPCID) 20 MG tablet TAKE 1 TABLET(20 MG) BY MOUTH TWICE DAILY 09/29/19   Caroline More, DO    fentaNYL (DURAGESIC) 25 MCG/HR Place 1 patch onto the skin every 3 (three) days.    [provider]  finasteride (PROSCAR) 5 MG tablet Take 1 tablet (5 mg total) by mouth daily. 08/04/19   Tammi Klippel, Sherin, DO  fluticasone (FLONASE) 50 MCG/ACT nasal spray Place 2 sprays into both nostrils daily. 10/14/18   Caroline More, DO  halobetasol (ULTRAVATE) 0.05 % cream Apply topically 2 (two) times daily. 02/14/19   Kinnie Feil, MD  HYDROcodone-acetaminophen (NORCO/VICODIN) 5-325 MG tablet Take 1 tablet by mouth every 6 (six) hours as needed for moderate pain.    [provider]  lisinopril (ZESTRIL) 5 MG tablet Take 1 tablet (5 mg total) by mouth at bedtime. 06/09/19   Caroline More, DO  loratadine (CLARITIN) 10 MG tablet Take 1 tablet (10 mg total) by mouth daily. 03/10/19   Caroline More, DO  nitroGLYCERIN (NITROSTAT) 0.3 MG SL tablet Place 0.3 mg under the tongue every 5 (five) minutes as needed for chest pain.    [provider]  Olopatadine HCl 0.2 % SOLN Apply 1 drop to eye daily. 05/20/19   Caroline More, DO  tamsulosin (FLOMAX) 0.4 MG CAPS capsule Take 2 capsules (0.8 mg total) by mouth daily after breakfast. 03/15/18   Mikell, Jeani Sow, MD  tizanidine (ZANAFLEX) 2 MG capsule Take 2 mg by mouth 3 (three) times daily.    [provider]    Allergies    Aspirin, Lubiprostone, Pantoprazole, and Vitamin c  Review of Systems   Review of Systems  Constitutional: Positive for appetite change.  HENT: Positive for congestion.   Respiratory: Positive for cough.   Gastrointestinal: Positive for abdominal pain and blood in stool.  Musculoskeletal: Positive for myalgias.  Neurological: Negative for weakness.  Psychiatric/Behavioral: Negative for confusion.    Physical Exam Updated Vital Signs BP (!) 118/58   Pulse 67   Temp 98.2 F (36.8 C) (Oral)   Resp 18   SpO2 97%   Physical Exam Vitals and nursing note reviewed.  HENT:     Head:  Normocephalic.  Cardiovascular:     Rate and Rhythm: Regular rhythm.  Pulmonary:     Breath sounds: No wheezing or rhonchi.  Abdominal:     Comments: Upper abdominal tenderness without rebound or guarding.  Skin:    General: Skin is warm.     Coloration: Skin is not pale.  Neurological:     Mental Status: He is alert.     ED Results / Procedures / Treatments   Labs (all labs ordered are listed, but only abnormal results are displayed) Labs Reviewed  COMPREHENSIVE METABOLIC PANEL -  Abnormal; Notable for the following components:      Result Value   Sodium 130 (*)    CO2 18 (*)    Glucose, Bld 127 (*)    Creatinine, Ser 1.50 (*)    Calcium 8.1 (*)    Albumin 3.4 (*)    GFR calc non Af Amer 43 (*)    GFR calc Af Amer 50 (*)    All other components within normal limits  CBC WITH DIFFERENTIAL/PLATELET - Abnormal; Notable for the following components:   Hemoglobin 12.5 (*)    HCT 38.1 (*)    All other components within normal limits  URINALYSIS, ROUTINE W REFLEX MICROSCOPIC - Abnormal; Notable for the following components:   Hgb urine dipstick SMALL (*)    Bacteria, UA RARE (*)    All other components within normal limits  POC OCCULT BLOOD, ED - Abnormal; Notable for the following components:   Fecal Occult Bld POSITIVE (*)    All other components within normal limits  URINE CULTURE  LIPASE, BLOOD  TROPONIN I (HIGH SENSITIVITY)    EKG EKG Interpretation  Date/Time:  Saturday November 22 2019 11:40:29 EST Ventricular Rate:  75 PR Interval:  264 QRS Duration: 126 QT Interval:  392 QTC Calculation: 437 R Axis:   107 Text Interpretation: Sinus rhythm with 1st degree A-V block Right bundle branch block Possible Anterior infarct , age undetermined Abnormal ECG Confirmed by Davonna Belling [phone]) on 11/22/2019 1:26:08 PM Also confirmed by Davonna Belling [phone])  on 11/22/2019 4:09:49 PM   Radiology DG Chest Portable 1 View  Result Date: 11/22/2019 CLINICAL DATA:   Cough, headache, body aches EXAM: PORTABLE CHEST 1 VIEW COMPARISON:  11/09/2019 FINDINGS: Lungs are essentially clear. Mild bibasilar opacities, likely atelectasis. No pleural effusion or pneumothorax. The heart is normal in size.  Thoracic aortic atherosclerosis. IMPRESSION: No evidence of acute cardiopulmonary disease. Electronically Signed   By: Julian Hy M.D.   On: 11/22/2019 11:58    Procedures Procedures (including critical care time)  Medications Ordered in ED Medications  sodium chloride 0.9 % bolus 1,000 mL (0 mLs Intravenous Stopped 11/22/19 1552)    ED Course  I have reviewed the triage vital signs and the nursing notes.  Pertinent labs & imaging results that were available during my care of the patient were reviewed by me and considered in my medical decision making (see chart for details).    MDM Rules/Calculators/A&P                      Patient with abdominal pain.  Blood in stool.  Generalized weakness.  States he does have chest pain at times but states he causes reflux.  States he gets gassy and it goes up to his chest.  No gross blood on rectal exam but guaiac did show blood.  Has had some pressures are gone down to the 90s on the ER.  Hemoglobin near baseline.  With hypotension and GI bleed will discuss with patient's PCP, the family practice center.  Family practice will admit.  Also Covid test is come back positive. Final Clinical Impression(s) / ED Diagnoses Final diagnoses:  Gastrointestinal hemorrhage, unspecified gastrointestinal hemorrhage type  Abdominal pain, unspecified abdominal location    Rx / DC Orders ED Discharge Orders    None       Davonna Belling, MD 11/22/19 1612    Davonna Belling, MD 11/22/19 [phone]

## 2019-11-22 NOTE — ED Notes (Signed)
X-ray at bedside

## 2019-11-22 NOTE — ED Triage Notes (Signed)
Pt reports headache and body aches since this morning.  Requesting COVID testing.  Tested negative on 1/10.

## 2019-11-23 DIAGNOSIS — K625 Hemorrhage of anus and rectum: Secondary | ICD-10-CM | POA: Diagnosis not present

## 2019-11-23 DIAGNOSIS — U071 COVID-19: Secondary | ICD-10-CM | POA: Diagnosis not present

## 2019-11-23 LAB — BASIC METABOLIC PANEL
Anion gap: 8 (ref 5–15)
BUN: 16 mg/dL (ref 8–23)
CO2: 21 mmol/L — ABNORMAL LOW (ref 22–32)
Calcium: 8.2 mg/dL — ABNORMAL LOW (ref 8.9–10.3)
Chloride: 108 mmol/L (ref 98–111)
Creatinine, Ser: 1.33 mg/dL — ABNORMAL HIGH (ref 0.61–1.24)
GFR calc Af Amer: 58 mL/min — ABNORMAL LOW (ref >60)
GFR calc non Af Amer: 50 mL/min — ABNORMAL LOW (ref >60)
Glucose, Bld: 101 mg/dL — ABNORMAL HIGH (ref 70–99)
Potassium: 4.2 mmol/L (ref 3.5–5.1)
Sodium: 137 mmol/L (ref 135–145)

## 2019-11-23 LAB — CBC
HCT: 34.1 % — ABNORMAL LOW (ref 39.0–52.0)
Hemoglobin: 11.6 g/dL — ABNORMAL LOW (ref 13.0–17.0)
MCH: 27.2 pg (ref 26.0–34.0)
MCHC: 34 g/dL (ref 30.0–36.0)
MCV: 79.9 fL — ABNORMAL LOW (ref 80.0–100.0)
Platelets: 160 10*3/uL (ref 150–400)
RBC: 4.27 MIL/uL (ref 4.22–5.81)
RDW: 14 % (ref 11.5–15.5)
WBC: 3.9 10*3/uL — ABNORMAL LOW (ref 4.0–10.5)
nRBC: 0 % (ref 0.0–0.2)

## 2019-11-23 LAB — URINE CULTURE: Culture: <10000 — AB

## 2019-11-23 LAB — GLUCOSE, CAPILLARY: Glucose-Capillary: 91 mg/dL (ref 70–99)

## 2019-11-23 MED ORDER — ACETAMINOPHEN 325 MG PO TABS: 650.0000 mg | ORAL_TABLET | Freq: Four times a day (QID) | ORAL | 0 refills | Status: DC | PRN

## 2019-11-23 MED ORDER — CEPHALEXIN 500 MG PO CAPS
500.0000 mg | ORAL_CAPSULE | Freq: Two times a day (BID) | ORAL | Status: DC
  Administered 2019-11-23 (×2): 500 mg via ORAL
  Filled 2019-11-23 (×2): qty 1

## 2019-11-23 MED ORDER — CEPHALEXIN 500 MG PO CAPS: 500.0000 mg | ORAL_CAPSULE | Freq: Two times a day (BID) | ORAL | 0 refills | Status: AC

## 2019-11-23 MED ORDER — GUAIFENESIN ER 600 MG PO TB12: 600.0000 mg | ORAL_TABLET | Freq: Two times a day (BID) | ORAL | 2 refills | Status: DC

## 2019-11-23 NOTE — Discharge Instructions (Addendum)
Please self isolate until 12/12/2019 due to COVID19 infection.      Person Under Monitoring Name: Omar Sanders  Location: [place] [address]   Infection Prevention Recommendations for Individuals Confirmed to have, or Being Evaluated for, 2019 Novel Coronavirus (COVID-19) Infection Who Receive Care at Home  Individuals who are confirmed to have, or are being evaluated for, COVID-19 should follow the prevention steps below until a healthcare provider or local or state health department says they can return to normal activities.  Stay home except to get medical care You should restrict activities outside your home, except for getting medical care. Do not go to work, school, or public areas, and do not use public transportation or taxis.  Call ahead before visiting your doctor Before your medical appointment, call the healthcare provider and tell them that you have, or are being evaluated for, COVID-19 infection. This will help the healthcare provider's office take steps to keep other people from getting infected. Ask your healthcare provider to call the local or state health department.  Monitor your symptoms Seek prompt medical attention if your illness is worsening (e.g., difficulty breathing). Before going to your medical appointment, call the healthcare provider and tell them that you have, or are being evaluated for, COVID-19 infection. Ask your healthcare provider to call the local or state health department.  Wear a facemask You should wear a facemask that covers your nose and mouth when you are in the same room with other people and when you visit a healthcare provider. People who live with or visit you should also wear a facemask while they are in the same room with you.  Separate yourself from other people in your home As much as possible, you should stay in a different room from other people in your home. Also, you should use a  separate bathroom, if available.  Avoid sharing household items You should not share dishes, drinking glasses, cups, eating utensils, towels, bedding, or other items with other people in your home. After using these items, you should wash them thoroughly with soap and water.  Cover your coughs and sneezes Cover your mouth and nose with a tissue when you cough or sneeze, or you can cough or sneeze into your sleeve. Throw used tissues in a lined trash can, and immediately wash your hands with soap and water for at least 20 seconds or use an alcohol-based hand rub.  Wash your [employer] your hands often and thoroughly with soap and water for at least 20 seconds. You can use an alcohol-based hand sanitizer if soap and water are not available and if your hands are not visibly dirty. Avoid touching your eyes, nose, and mouth with unwashed hands.   Prevention Steps for Caregivers and Household Members of Individuals Confirmed to have, or Being Evaluated for, COVID-19 Infection Being Cared for in the Home  If you live with, or provide care at home for, a person confirmed to have, or being evaluated for, COVID-19 infection please follow these guidelines to prevent infection:  Follow healthcare provider's instructions Make sure that you understand and can help the patient follow any healthcare provider instructions for all care.  Provide for the patient's basic needs You should help the patient with basic needs in the home and provide support for getting groceries, prescriptions, and other personal needs.  Monitor the patient's symptoms If they are getting sicker, call his or her medical provider and tell them that the patient has,  or is being evaluated for, COVID-19 infection. This will help the healthcare provider's office take steps to keep other people from getting infected. Ask the healthcare provider to call the local or state health department.  Limit the number of people who have  contact with the patient  If possible, have only one caregiver for the patient.  Other household members should stay in another home or place of residence. If this is not possible, they should stay  in another room, or be separated from the patient as much as possible. Use a separate bathroom, if available.  Restrict visitors who do not have an essential need to be in the home.  Keep older adults, very young children, and other sick people away from the patient Keep older adults, very young children, and those who have compromised immune systems or chronic health conditions away from the patient. This includes people with chronic heart, lung, or kidney conditions, diabetes, and cancer.  Ensure good ventilation Make sure that shared spaces in the home have good air flow, such as from an air conditioner or an opened window, weather permitting.  Wash your hands often  Wash your hands often and thoroughly with soap and water for at least 20 seconds. You can use an alcohol based hand sanitizer if soap and water are not available and if your hands are not visibly dirty.  Avoid touching your eyes, nose, and mouth with unwashed hands.  Use disposable paper towels to dry your hands. If not available, use dedicated cloth towels and replace them when they become wet.  Wear a facemask and gloves  Wear a disposable facemask at all times in the room and gloves when you touch or have contact with the patient's blood, body fluids, and/or secretions or excretions, such as sweat, saliva, sputum, nasal mucus, vomit, urine, or feces.  Ensure the mask fits over your nose and mouth tightly, and do not touch it during use.  Throw out disposable facemasks and gloves after using them. Do not reuse.  Wash your hands immediately after removing your facemask and gloves.  If your personal clothing becomes contaminated, carefully remove clothing and launder. Wash your hands after handling contaminated  clothing.  Place all used disposable facemasks, gloves, and other waste in a lined container before disposing them with other household waste.  Remove gloves and wash your hands immediately after handling these items.  Do not share dishes, glasses, or other household items with the patient  Avoid sharing household items. You should not share dishes, drinking glasses, cups, eating utensils, towels, bedding, or other items with a patient who is confirmed to have, or being evaluated for, COVID-19 infection.  After the person uses these items, you should wash them thoroughly with soap and water.  Wash laundry thoroughly  Immediately remove and wash clothes or bedding that have blood, body fluids, and/or secretions or excretions, such as sweat, saliva, sputum, nasal mucus, vomit, urine, or feces, on them.  Wear gloves when handling laundry from the patient.  Read and follow directions on labels of laundry or clothing items and detergent. In general, wash and dry with the warmest temperatures recommended on the label.  Clean all areas the individual has used often  Clean all touchable surfaces, such as counters, tabletops, doorknobs, bathroom fixtures, toilets, phones, keyboards, tablets, and bedside tables, every day. Also, clean any surfaces that may have blood, body fluids, and/or secretions or excretions on them.  Wear gloves when cleaning surfaces the patient has come in  contact with.  Use a diluted bleach solution (e.g., dilute bleach with 1 part bleach and 10 parts water) or a household disinfectant with a label that says EPA-registered for coronaviruses. To make a bleach solution at home, add 1 tablespoon of bleach to 1 quart (4 cups) of water. For a larger supply, add  cup of bleach to 1 gallon (16 cups) of water.  Read labels of cleaning products and follow recommendations provided on product labels. Labels contain instructions for safe and effective use of the cleaning product  including precautions you should take when applying the product, such as wearing gloves or eye protection and making sure you have good ventilation during use of the product.  Remove gloves and wash hands immediately after cleaning.  Monitor yourself for signs and symptoms of illness Caregivers and household members are considered close contacts, should monitor their health, and will be asked to limit movement outside of the home to the extent possible. Follow the monitoring steps for close contacts listed on the symptom monitoring form.   ? If you have additional questions, contact your local health department or call the epidemiologist on call at [phone] (available 24/7). ? This guidance is subject to change. For the most up-to-date guidance from [facility], please refer to their website: YouBlogs.pl

## 2019-11-23 NOTE — Care Management Obs Status (Signed)
[facility] [place] NOTIFICATION   Patient Details  Name: MICKY ENRIGUEZ MRN: [id number] Date of Birth: 03/16/1941   Medicare Observation Status Notification Given:  No(Could not reach patient on the phone (+COVID) could not reach family member) Name Relation Home Work Mobile  Sinh,Cil Daughter [phone]      Carles Collet, RN 11/23/2019, 4:50 PM

## 2019-11-23 NOTE — Evaluation (Signed)
Occupational Therapy Evaluation Patient Details Name: Omar Sanders MRN: [id number] DOB: [birth year] Today's Date: 11/23/2019    History of Present Illness 79 y.o. male presenting with rectal bleeding and URI symptoms concerning for COVID, found to be COVID positive (mild symptoms since 1/7 with negative COVID test 1/10). Pt reports during therapy that he came to ED due to dizziness and headache with feeling unsure of his balance/walking. These symptoms persist. PMH is significant for seizures, OSA, right bundle branch block, anal fissure, hypertension, hyperlipidemia, chronic pain, anxiety   Clinical Impression   Pt admitted with above. He demonstrates the below listed deficits and will benefit from continued OT to maximize safety and independence with BADLs.  Pt was seen in conjunction with PT.  He demonstrates decreased activity tolerance, impaired balance, and generalized weakness. He reports onset of dizziness over the past 1-2 weeks.  He currently is able to perform ADLs with min guard assist.  He reports he lives with his family, who is supportive, and that he was independent PTA.  Will follow acutely.          Follow Up Recommendations  No OT follow up;Supervision - Intermittent    Equipment Recommendations  Tub/shower bench    Recommendations for Other Services       Precautions / Restrictions Precautions Precautions: Fall Precaution Comments: dizziness      Mobility Bed Mobility Overal bed mobility: Modified Independent             General bed mobility comments: HOB elevated; up to rt EOB  Transfers Overall transfer level: Needs assistance Equipment used: None Transfers: Sit to/from Stand Sit to Stand: Min guard         General transfer comment: pt reporting dizziness; assessed for orthostasis (SBP decr 10 mmHg)    Balance Overall balance assessment: (pt reports feeling unsure of his steps (sounds ?due to numbn)                                 [employer] Standing Unsupported, Alternately Place Feet on Step/Stool: Able to complete 4 steps without aid or supervision       ADL either performed or assessed with clinical judgement   ADL Overall ADL's : Needs assistance/impaired Eating/Feeding: Independent   Grooming: Wash/dry hands;Wash/dry face;Oral care;Brushing hair;Supervision/safety;Standing   Upper Body Bathing: Set up;Sitting   Lower Body Bathing: Min guard;Sit to/from stand   Upper Body Dressing : Set up;Sitting   Lower Body Dressing: Min guard;Sit to/from stand   Toilet Transfer: Min guard;Ambulation;Comfort height toilet   Toileting- Clothing Manipulation and Hygiene: Min guard;Sit to/from stand       Functional mobility during ADLs: Min guard General ADL Comments: Pt fatigues more quickly than normal      Vision Baseline Vision/History: Wears glasses Wears Glasses: At all times Patient Visual Report: No change from baseline       Perception     Praxis Praxis Praxis tested?: Within functional limits    Pertinent Vitals/Pain Pain Assessment: Faces Faces Pain Scale: Hurts a little bit Pain Location: back; numbness both legs Pain Descriptors / Indicators: Numbness Pain Intervention(s): Monitored during session     Hand Dominance Right   Extremity/Trunk Assessment Upper Extremity Assessment Upper Extremity Assessment: Generalized weakness   Lower Extremity Assessment Lower Extremity Assessment: Defer to PT evaluation   Cervical / Trunk Assessment Cervical / Trunk Assessment: Normal   Communication Communication Communication: Interpreter utilized(Interpreter [employer]  [id number])   Cognition Arousal/Alertness: Awake/alert Behavior During Therapy: WFL for tasks assessed/performed Overall Cognitive Status: Difficult to assess                                 General Comments: interpreter with frequent repetition of questions to get at the information I was asking; often  long discussions between interpreter and pt to answer a yes/no question   General Comments  once seated EOB began to describe dizziness with multiple questions via interpreter trying to get pt to better describe his symptoms and if related to position changes (denied light-headedness/feeling faint or spinning; has it when lying down, worse when sitting up, no significant change with standing; improved after ADLs at sink, but still present; on return to bed.  PT assessed for BPPV     Exercises     Shoulder Instructions      Home Living Family/patient expects to be discharged to:: Private residence Living Arrangements: Other relatives Available Help at Discharge: Family Type of Home: House       Home Layout: Two level;Bed/bath upstairs Alternate Level Stairs-Number of Steps: a few Alternate Level Stairs-Rails: Right;Left Bathroom Shower/Tub: Teacher, early years/pre: Standard     Home Equipment: Cane - single point   Additional Comments: Pt reports he lives with family and someone is home with him most of the time       Prior Functioning/Environment Level of Independence: Independent        Comments: does not use a device; denies falls; manages own medications; does not drive        OT Problem List: Decreased activity tolerance;Impaired balance (sitting and/or standing);Pain      OT Treatment/Interventions: Self-care/ADL training;Therapeutic exercise;Energy conservation;DME and/or AE instruction;Therapeutic activities;Balance training;Patient/family education    OT Goals(Current goals can be found in the care plan section) Acute Rehab OT Goals Patient Stated Goal: to find out why he is dizzy OT Goal Formulation: With patient Time For Goal Achievement: 12/07/19 Potential to Achieve Goals: Good ADL Goals Pt Will Perform Grooming: (P) with modified independence;standing Pt Will Perform Upper Body Bathing: (P) with modified independence;sitting;standing Pt Will  Perform Lower Body Bathing: (P) with modified independence;sit to/from stand Pt Will Perform Upper Body Dressing: (P) with modified independence;sitting;standing Pt Will Perform Lower Body Dressing: (P) with modified independence;sit to/from stand Pt Will Transfer to Toilet: (P) with modified independence;ambulating;regular height toilet;bedside commode;grab bars Pt Will Perform Toileting - Clothing Manipulation and hygiene: (P) with modified independence;sit to/from stand Pt/caregiver will Perform Home Exercise Program: (P) Increased strength;Both right and left upper extremity;With theraband;With written HEP provided;Independently  OT Frequency: Min 2X/week   Barriers to D/C:            Co-evaluation PT/OT/SLP Co-Evaluation/Treatment: Yes Reason for Co-Treatment: For patient/therapist safety;To address functional/ADL transfers   OT goals addressed during session: ADL's and self-care      AM-PAC OT "6 Clicks" Daily Activity     Outcome Measure Help from another person eating meals?: None Help from another person taking care of personal grooming?: A Little Help from another person toileting, which includes using toliet, bedpan, or urinal?: A Little Help from another person bathing (including washing, rinsing, drying)?: A Little Help from another person to put on and taking off regular upper body clothing?: A Little Help from another person to put on and taking off regular lower body clothing?: A Little 6 Click Score: 19  End of Session Equipment Utilized During Treatment: Gait belt Nurse Communication: Mobility status  Activity Tolerance: Patient tolerated treatment well Patient left: in bed;with call bell/phone within reach  OT Visit Diagnosis: Unsteadiness on feet (R26.81)                Time: [id number] OT Time Calculation (min): 37 min Charges:  OT General Charges $OT Visit: 1 Visit OT Evaluation $OT Eval Moderate Complexity: 1 Mod  Nilsa Nutting., OTR/L Acute  Rehabilitation Services Pager [phone] Office [phone]   Lucille Passy M 11/23/2019, 1:55 PM

## 2019-11-23 NOTE — ED Notes (Signed)
Pt resting in bed. No distress noted. Will continue to monitor.  

## 2019-11-23 NOTE — ED Notes (Signed)
Pt transported via cart to 2W23 by this RN. Pt conscious, breathing, and A&Ox4. No distress noted. All belongings with pt. Pt on continuous blood pressure, pulse ox, and cardiac monitoring.

## 2019-11-23 NOTE — ED Notes (Signed)
Pt care endorsed from [facility]yland RN. Pt conscious, breathing, and A&Ox4. No distress noted. Will continue to monitor.

## 2019-11-23 NOTE — Evaluation (Signed)
Patient has active orders for cardiac monitoring until time of discharge. Physician notified via page that this is not feasible unless there is an upgrade in patient level of care. Will continue to assess for any changes and/or discontinuations of orders.

## 2019-11-23 NOTE — ED Notes (Signed)
Pt denies new or worsening complaints. Will continue to monitor.

## 2019-11-23 NOTE — Progress Notes (Signed)
Family Medicine Teaching Service Daily Progress Note Intern Pager: [phone]  Patient name: Omar Sanders record number: [id number] Date of birth: 12-03-1940 Age: 79 y.o. Gender: male  Primary Care Provider: Caroline More, DO Consultants:None Code Status:    Code Status: Full Code   Pt Overview and Major Events to Date:  Assessment and Plan: Omar Sanders is a 79 y.o. male presenting with rectal bleeding and URI symptoms concerning for COVID, found to be COVID positive. PMH is significant for seizures, OSA, right bundle branch block, anal fissure, grains, BPH, hypertension, hyperlipidemia, headache, GERD, chronic pain, anxiety.   COVID-19 Illness: Stable.  VSS, afebrile. NWOB on RA. Normal O2 sats. Patient complains of generalized chills.  Improved with Tylenol. -Patient stable from respiratory standpoint. -Tylenol as needed for generalized fatigue aches fever -Recommend self quarantining for 21 days since the onset of illness  Rectal Bleeding  FOBT positive. Hemoglobin slightly down from 12.5 to 11.6 today.   patient was on maintenance IV fluids and status post 1 L normal saline bolus.  All cell lines are downtrending.  Likely delusional.  Patient reports no more episodes of bleeding.  Given that was only Herbon wiping, likely hemorrhoids versus slow GI bleed.  Patient is not on any NSAIDs or antiplatelets at home.  -Recommend follow-up with GI in the outpatient setting -Recommend repeat CBC in 1 week  Dysuria Still has symptoms of dysuria.  UA is possibly positive for UTI with small hemoglobin and rare bacteria.  Urine culture still pending -Follow-up urine culture -Empirical treatment with Keflex and discontinuation of urine cultures negative  Irritable bowel syndrome, constipation type  nausea: Chronic, stable. No abdominal pain or complaints.  Recommend outpatient GI follow-up.   CKD:  Improved,  Creatinine 1.3 this a.m., down from 1.5 yesterday. -Recommend  follow-up with outpatient neurology as he has been doing  Hypertension Normotensive.  Recommend holding home lisinopril  Hyperlipidemia Patient home meds include atorvastatin 40. -We will continue home atorvastatin  BPH Patient home meds include finasteride 5 mg and Flomax daily.  -We will continue patient's home finasteride and flomax   GERD  Home medications include Pepcid 20 mg BID   Seasonal allergies Patient home meds include Zyrtec 10 mg, Flonase 50  FEN/GI: Carb modified  Prophylaxis: lovenox   Disposition:  Likely discharge home today  Subjective:  Patient complained of generalized chills.  Improved with Tylenol.  But overall feels improved.  Objective: Temp:  [97.4 F (36.3 C)-98.9 F (37.2 C)] 97.4 F (36.3 C) (01/24 0745) Pulse Rate:  [52-88] 56 (01/24 0745) Resp:  [13-27] 20 (01/24 0745) BP: (91-133)/(56-96) 102/61 (01/24 0745) SpO2:  [91 %-99 %] 95 % (01/24 0745) Physical Exam: General: NAD, well appearing CVS: RRR, no MRG Lungs: CTAB, no increased work of breathing Abdomen: Soft, nontender, nondistended MSK: No lower extremity edema, 2+ dp   Laboratory: Recent Labs  Lab 11/22/19 1148 11/23/19 0248  WBC 5.2 3.9*  HGB 12.5* 11.6*  HCT 38.1* 34.1*  PLT 161 160   Recent Labs  Lab 11/22/19 1148 11/23/19 0248  NA 130* 137  K 4.0 4.2  CL 101 108  CO2 18* 21*  BUN 16 16  CREATININE 1.50* 1.33*  CALCIUM 8.1* 8.2*  PROT 7.4  --   BILITOT 0.9  --   ALKPHOS 52  --   ALT 25  --   AST 29  --   GLUCOSE 127* 101*   Imaging/Diagnostic Tests: DG Chest Portable 1 View  Result Date:  11/22/2019 CLINICAL DATA:  Cough, headache, body aches EXAM: PORTABLE CHEST 1 VIEW COMPARISON:  11/09/2019 FINDINGS: Lungs are essentially clear. Mild bibasilar opacities, likely atelectasis. No pleural effusion or pneumothorax. The heart is normal in size.  Thoracic aortic atherosclerosis. IMPRESSION: No evidence of acute cardiopulmonary disease.  Electronically Signed   By: Julian Hy M.D.   On: 11/22/2019 11:58    Bonnita Hollow, MD 11/23/2019, 9:40 AM PGY-3, [place] Intern pager: [phone], text pages welcome

## 2019-11-23 NOTE — Progress Notes (Signed)
Adequate teach-back instructions communicated via medical interpreter. Patient informed of [facility] discharge instructions per [facility]. Family informed of diagnosis via patient as well as follow-up/home health instructions. Family states that they require no further information. Pick-up instructions explained in full detail to driver of vehicle. Patient escorted from floor by Omar Sanders, NT via wheelchair with all belongs accounted for on admission. Discharge uneventful.

## 2019-11-23 NOTE — Evaluation (Signed)
Physical Therapy Evaluation Patient Details Name: Omar Sanders MRN: [id number] DOB: 1941-07-25 Today's Date: 11/23/2019   History of Present Illness  79 y.o. male presenting with rectal bleeding and URI symptoms concerning for COVID, found to be COVID positive (mild symptoms since 1/7 with negative COVID test 1/10). Pt reports during therapy that he came to ED due to dizziness and headache with feeling unsure of his balance/walking. These symptoms persist. PMH is significant for seizures, OSA, right bundle branch block, anal fissure, hypertension, hyperlipidemia, chronic pain, anxiety  Clinical Impression   Pt admitted with above diagnosis. Patient reporting recent onset of dizziness and headache with feeling unsure of his steps and states this is why he came to the hospital. Patient with difficulty describing the dizziness, but reports it is constant (better in supine, but still present). Patient with difficulty with higher level balance activities (step taps to 6" step alternating feet; standing eyes closed), however he did not lose his balance he would simply stop doing the activity. Overall, is not consistent with BPPV. Will notify MD.  Pt currently with functional limitations due to the deficits listed below (see PT Problem List). Pt will benefit from skilled PT to increase their independence and safety with mobility to allow discharge to the venue listed below.       Follow Up Recommendations Home health PT;Supervision for mobility/OOB    Equipment Recommendations  None recommended by PT    Recommendations for Other Services       Precautions / Restrictions Precautions Precautions: Fall Precaution Comments: dizziness      Mobility  Bed Mobility Overal bed mobility: Modified Independent             General bed mobility comments: HOB elevated; up to rt EOB  Transfers Overall transfer level: Needs assistance Equipment used: None Transfers: Sit to/from Stand Sit to Stand:  Min guard         General transfer comment: pt reporting dizziness; assessed for orthostasis (SBP decr 10 mmHg)  Ambulation/Gait Ambulation/Gait assistance: Min guard Gait Distance (Feet): 20 Feet Assistive device: None Gait Pattern/deviations: Step-through pattern;Decreased stride length     General Gait Details: decr stride length due to limited space; no imbalance or reports of incr dizziness with 180 degree turn  Stairs            Wheelchair Mobility    Modified Rankin (Stroke Patients Only)       Balance Overall balance assessment: (pt reports feeling unsure of his steps (sounds ?due to numbn)                               Standardized Balance Assessment Standardized Balance Assessment : Berg Balance Test Berg Balance Test Sit to Stand: Able to stand without using hands and stabilize independently Standing Unsupported: Able to stand safely 2 minutes Sitting with Back Unsupported but Feet Supported on Floor or Stool: Able to sit safely and securely 2 minutes Stand to Sit: Sits safely with minimal use of hands Transfers: Able to transfer safely, minor use of hands Standing Unsupported with Eyes Closed: Unable to keep eyes closed 3 seconds but stays steady(feels he will lose his balance & opens eyes <2 sec) Standing Ubsupported with Feet Together: Able to place feet together independently and stand 1 minute safely Standing Unsupported, Alternately Place Feet on Step/Stool: Able to complete 4 steps without aid or supervision(no LOB, however pt very guarded & feels challenged) Standing Unsupported,  One Foot in Front: Able to plae foot ahead of the other independently and hold 30 seconds         Pertinent Vitals/Pain Pain Assessment: Faces Faces Pain Scale: Hurts a little bit Pain Location: back; numbness both legs Pain Descriptors / Indicators: Numbness Pain Intervention(s): Monitored during session    Home Living Family/patient expects to be  discharged to:: Private residence Living Arrangements: Other relatives Available Help at Discharge: Family Type of Home: [place]: Two level;Bed/bath upstairs Home Equipment: Kasandra Knudsen - single point      Prior Function Level of Independence: Independent         Comments: does not use a device; denies falls; manages own medications; does not drive     Hand Dominance        Extremity/Trunk Assessment   Upper Extremity Assessment Upper Extremity Assessment: Defer to OT evaluation    Lower Extremity Assessment Lower Extremity Assessment: RLE deficits/detail;LLE deficits/detail RLE Sensation: history of peripheral neuropathy LLE Sensation: history of peripheral neuropathy    Cervical / Trunk Assessment Cervical / Trunk Assessment: Normal  Communication   Communication: Interpreter utilized(Interpreter Omar Sanders [phone])  Cognition Arousal/Alertness: Awake/alert Behavior During Therapy: WFL for tasks assessed/performed Overall Cognitive Status: Difficult to assess                                 General Comments: interpreter with frequent repetition of questions to get at the information I was asking; often long discussions between interpreter and pt to answer a yes/no question      General Comments General comments (skin integrity, edema, etc.): once seated EOB began to describe dizziness with multiple questions via interpreter trying to get pt to better describe his symptoms and if related to position changes (denied light-headedness/feeling faint or spinning; has it when lying down, worse when sitting up, no significant change with standing; improved after ADLs at sink, but still present; on return to bed, performed sidelying test to his right for ?BPPV with no symptoms elicited    Exercises     Assessment/Plan    PT Assessment Patient needs continued PT services  PT Problem List Decreased activity tolerance;Decreased balance;Decreased  mobility;Impaired sensation       PT Treatment Interventions DME instruction;Gait training;Functional mobility training;Therapeutic activities;Balance training;Neuromuscular re-education;Patient/family education    PT Goals (Current goals can be found in the Care Plan section)  Acute Rehab PT Goals Patient Stated Goal: to find out why he is dizzy PT Goal Formulation: With patient Time For Goal Achievement: 12/07/19 Potential to Achieve Goals: Fair    Frequency Min 3X/week   Barriers to discharge        Co-evaluation PT/OT/SLP Co-Evaluation/Treatment: Yes Reason for Co-Treatment: To address functional/ADL transfers;Other (comment)(using interpreter) PT goals addressed during session: Mobility/safety with mobility;Balance         AM-PAC PT "6 Clicks" Mobility  Outcome Measure Help needed turning from your back to your side while in a flat bed without using bedrails?: None Help needed moving from lying on your back to sitting on the side of a flat bed without using bedrails?: A Little Help needed moving to and from a bed to a chair (including a wheelchair)?: A Little Help needed standing up from a chair using your arms (e.g., wheelchair or bedside chair)?: A Little Help needed to walk in hospital room?: A Little Help needed climbing 3-5 steps with a railing? :  A Little 6 Click Score: 19    End of Session   Activity Tolerance: Treatment limited secondary to medical complications (Comment)(dizziness and headache) Patient left: in bed;with call bell/phone within reach   PT Visit Diagnosis: Other abnormalities of gait and mobility (R26.89);Dizziness and giddiness (R42)    Time: [id number] PT Time Calculation (min) (ACUTE ONLY): 71 min   Charges:   PT Evaluation $PT Eval High Complexity: 1 High PT Treatments $Therapeutic Activity: 8-22 mins         Arby Barrette, PT Pager [phone]   Rexanne Mano 11/23/2019, 11:49 AM

## 2019-11-26 ENCOUNTER — Other Ambulatory Visit: Payer: Medicare Other

## 2019-11-26 ENCOUNTER — Telehealth (INDEPENDENT_AMBULATORY_CARE_PROVIDER_SITE_OTHER): Payer: Medicare Other | Admitting: Family Medicine

## 2019-11-26 ENCOUNTER — Other Ambulatory Visit: Payer: Self-pay

## 2019-11-26 DIAGNOSIS — K625 Hemorrhage of anus and rectum: Secondary | ICD-10-CM

## 2019-11-26 DIAGNOSIS — K581 Irritable bowel syndrome with constipation: Secondary | ICD-10-CM | POA: Diagnosis not present

## 2019-11-26 DIAGNOSIS — U071 COVID-19: Secondary | ICD-10-CM | POA: Diagnosis not present

## 2019-11-26 NOTE — Progress Notes (Signed)
**  entire visit used with vietnamese interpretor**  [facility] Tele[facility] Visit  Patient consented to have virtual visit. Method of visit: Telephone  Encounter participants: Patient: Omar Sanders - located at home Provider: Sherene Sires - located at home telemed Others (if applicable): vietnamese interpretor  Chief Complaint: hospital f/u  HPI: No repeats of bleeding  Mild covid symptoms: runny nose, mild headache (relieved with tylenol and "oil") that he says is timed with a mild dizziness.  No breathing problems  Does say he is nervous about the fact that he still has cold symptoms and says if his runny nose and headaches don't resolve in a few days he should probably come to hospital, we discussed that mild symptoms like this can take weeks to resolve for some covid patients and that if anything gets worse we should definitely talk to him again  ROS: per HPI  Pertinent PMHx: covid pos 1/23, brbpr, ibs  Exam:  Respiratory: speaking in full sentences during exam, answering questions appropriately, no visual exam, only coughed twice  Assessment/Plan:  COVID-19 Pos at [facility] 1/23.  Only minor symptoms at home, ED precautions given  BRBPR (bright red blood per rectum) No repeat issues since discharge, scheduled cbc lab draw in a week and refer to GI  IBS (irritable bowel syndrome) No repeat issues since discharge, scheduled cbc lab draw in a week and refer to GI     Time spent during visit with patient: 20 minutes

## 2019-11-26 NOTE — Assessment & Plan Note (Signed)
No repeat issues since discharge, scheduled cbc lab draw in a week and refer to GI

## 2019-11-26 NOTE — Assessment & Plan Note (Addendum)
Pos at [facility] 1/23.  Only minor symptoms at home, ED precautions given

## 2019-11-27 ENCOUNTER — Other Ambulatory Visit: Payer: Self-pay | Admitting: *Deleted

## 2019-11-27 MED ORDER — FLUTICASONE PROPIONATE 50 MCG/ACT NA SUSP: 2.0000 | Freq: Every day | NASAL | 6 refills | Status: DC

## 2019-12-03 ENCOUNTER — Telehealth (INDEPENDENT_AMBULATORY_CARE_PROVIDER_SITE_OTHER): Payer: Medicare Other | Admitting: Family Medicine

## 2019-12-03 ENCOUNTER — Other Ambulatory Visit: Payer: Medicare Other

## 2019-12-03 DIAGNOSIS — U071 COVID-19: Secondary | ICD-10-CM

## 2019-12-03 DIAGNOSIS — R3912 Poor urinary stream: Secondary | ICD-10-CM

## 2019-12-03 DIAGNOSIS — N401 Enlarged prostate with lower urinary tract symptoms: Secondary | ICD-10-CM

## 2019-12-03 MED ORDER — NAPROXEN 250 MG PO TABS: 250.0000 mg | ORAL_TABLET | Freq: Two times a day (BID) | ORAL | 0 refills | Status: AC | PRN

## 2019-12-03 MED ORDER — TAMSULOSIN HCL 0.4 MG PO CAPS: 0.8000 mg | ORAL_CAPSULE | Freq: Every day | ORAL | 3 refills | Status: DC

## 2019-12-03 NOTE — Assessment & Plan Note (Signed)
Recent COVID-19 infection.  He is outside of his infectious window, but symptoms are concerning for continued viral process.  Cannot rule out anemia.  But since we cannot rule out infection, recommend delaying in person visit by 1 week and then have the patient have labs drawn on that day.  Visit was scheduled for next week on the phone for patient on 12/16/2019. Given no improvement with Tylenol, will start low-dose naproxen for symptoms of body aches and chills.  Patient with recent GI bleed, hemoglobin was stable but salines were downtrending.  No signs or symptoms of melena or bright red blood per rectum from history.  Also with a history of CKD 3 but creatinine was stable at 1.5 on discharge from hospital.  Advised patient that if he had any return of bright red blood per rectum, to go to the emergency department for evaluation repeat labs.  Precepted with Dr. Milta Deiters.

## 2019-12-03 NOTE — Progress Notes (Signed)
   CHIEF COMPLAINT / HPI:  Due to language barrier, an interpreter was present during the history-taking and subsequent discussion (and for part of the physical exam) with this patient.  Patient with recent COVID-19 infection and hospitalization for GI bleed.  He initially tested positive for COVID-19 on 1/23.  He was seen virtually on 1/27.  He has had continued mild symptoms of Covid including body aches and chills, cough, dizziness.  Denies fevers or shortness of breath.  He has been taking Tylenol regularly without improvement in symptoms.  Denies any more symptoms of dark tarry stools or bloody bowel movements.  PERTINENT  PMH / PSH:  History of GI bleed Recent COVID-19 infection CKD 3  OBJECTIVE: Unable to obtain due to for telephone visit used with interpreter  ASSESSMENT / PLAN:  COVID-19 Recent COVID-19 infection.  He is outside of his infectious window, but symptoms are concerning for continued viral process.  Cannot rule out anemia.  But since we cannot rule out infection, recommend delaying in person visit by 1 week and then have the patient have labs drawn on that day.  Visit was scheduled for next week on the phone for patient on 12/16/2019. Given no improvement with Tylenol, will start low-dose naproxen for symptoms of body aches and chills.  Patient with recent GI bleed, hemoglobin was stable but salines were downtrending.  No signs or symptoms of melena or bright red blood per rectum from history.  Also with a history of CKD 3 but creatinine was stable at 1.5 on discharge from hospital.  Advised patient that if he had any return of bright red blood per rectum, to go to the emergency department for evaluation repeat labs.  Precepted with Dr. Milta Deiters.     15 minutes on the phone. Bonnita Hollow, MD [place]

## 2019-12-04 ENCOUNTER — Other Ambulatory Visit: Payer: Medicare Other

## 2019-12-04 ENCOUNTER — Ambulatory Visit: Payer: Medicare Other | Admitting: Physician Assistant

## 2019-12-15 ENCOUNTER — Ambulatory Visit: Payer: Medicare Other | Admitting: Family Medicine

## 2019-12-16 ENCOUNTER — Telehealth: Payer: Medicare Other

## 2019-12-24 ENCOUNTER — Ambulatory Visit (INDEPENDENT_AMBULATORY_CARE_PROVIDER_SITE_OTHER): Payer: Medicare Other | Admitting: Family Medicine

## 2019-12-24 ENCOUNTER — Other Ambulatory Visit: Payer: Self-pay

## 2019-12-24 ENCOUNTER — Encounter: Payer: Self-pay | Admitting: Family Medicine

## 2019-12-24 VITALS — BP 140/60 | HR 60 | Wt 172.6 lb

## 2019-12-24 DIAGNOSIS — I10 Essential (primary) hypertension: Secondary | ICD-10-CM | POA: Diagnosis not present

## 2019-12-24 DIAGNOSIS — H6123 Impacted cerumen, bilateral: Secondary | ICD-10-CM

## 2019-12-24 DIAGNOSIS — K625 Hemorrhage of anus and rectum: Secondary | ICD-10-CM

## 2019-12-24 DIAGNOSIS — H6120 Impacted cerumen, unspecified ear: Secondary | ICD-10-CM | POA: Insufficient documentation

## 2019-12-24 DIAGNOSIS — D649 Anemia, unspecified: Secondary | ICD-10-CM

## 2019-12-24 NOTE — Assessment & Plan Note (Signed)
No blood per rectum since last appointment. Will get cbc at visit and update charting if needed.

## 2019-12-24 NOTE — Assessment & Plan Note (Signed)
Gave handout for otc debrox drops to help soften cerumen, follow up as needed

## 2019-12-24 NOTE — Assessment & Plan Note (Signed)
Will check bmp to see how kidney function is doing. Would likely benefit from small dose of lisinopril or perhaps amlodipine. If kidney function ok lisinopril 5mg , if elevated cr will do amlodipine 2.5mg .

## 2019-12-24 NOTE — Patient Instructions (Signed)
It was great seeing you again today! Your blood pressure still seems a little high. I will check your kidney function to see how that is doing. If it looks good I will send in your lisinopril. I will also check your blood counts to see if you have anemia.  Th?t vui khi g?p l?i b?n hm nay! Huy?t p c?a b?n c v? v?n h?i cao. Ti s? ki?m tra ch?c n?ng th?n c?a b?n ?? xem n ho?t ??ng nh? th? no. N?u c v? t?t, ti s? g?i lisinopril c?a b?n. Ti c?ng s? ki?m tra cng th?c mu c?a b?n ?? xem b?n c b? thi?u mu hay khng.

## 2019-12-24 NOTE — Progress Notes (Signed)
  [place] interpreter utilized throughout entirety of exam, Ovid Curd #[id number]  HPI 80 year old male who presents for hypertension and anemia follow up. He states that his blood pressure has been mostly normotensive but that around 5 or 6 pm it gets high. He says that the highest it has been has been up to [id number] systolic. He had headaches during this period. He also states that he has not been taking any blood pressure medication, but that intermittently he has been using some leftover lisinopril 5mg .  He reports no other rectal bleeding since leaving the hospital. He was noted to have an anal fissure in the hospital. Etiology felt to be due to internal hemorrhoids. He has not had any follow up with gi.  He does endorse feeling weak recently along with some mild shortness of breath with activity. Of note he did test covid + in the hospital but had relatively mild symptoms.  He also complains of some mild ear "itching". R>L.  CC: blood pressure check  ROS:   Review of Systems See HPI for ROS.   CC, SH/smoking status, and VS noted  Objective: BP 140/60   Pulse 60   Wt 172 lb 9.6 oz (78.3 kg)   SpO2 99%   BMI 27.03 kg/m  Gen: 79 year old vietnamese male, no distress, comfortable HEENT: mild cerumen impaction bilaterally R>L, eomi, perrl, tongue midline, mild oral pallor appreciated CV: rrr, no m/r/g Resp: lungs ctab, no accessory muscle use Abd: SNTND, BS present, no guarding or organomegaly Neuro: Alert and oriented, Speech clear, No gross deficits  Assessment and plan:  Essential hypertension Will check bmp to see how kidney function is doing. Would likely benefit from small dose of lisinopril or perhaps amlodipine. If kidney function ok lisinopril 5mg , if elevated cr will do amlodipine 2.5mg .  Anemia Will get cbc to evaluate for anemia. Certainly at higher risk for anemia given blood per rectum in the hospital and symptoms   Cerumen impaction Gave handout for otc debrox drops  to help soften cerumen, follow up as needed  BRBPR (bright red blood per rectum) No blood per rectum since last appointment. Will get cbc at visit and update charting if needed.   Orders Placed This Encounter  Procedures  . CBC with Differential  . Basic Metabolic Panel    No orders of the defined types were placed in this encounter.    Guadalupe Dawn MD PGY-3 Family Medicine Resident  12/24/2019 3:43 PM

## 2019-12-24 NOTE — Assessment & Plan Note (Signed)
Will get cbc to evaluate for anemia. Certainly at higher risk for anemia given blood per rectum in the hospital and symptoms

## 2019-12-25 ENCOUNTER — Telehealth: Payer: Self-pay | Admitting: Family Medicine

## 2019-12-25 LAB — BASIC METABOLIC PANEL
BUN/Creatinine Ratio: 9 — ABNORMAL LOW (ref 10–24)
BUN: 13 mg/dL (ref 8–27)
CO2: 20 mmol/L (ref 20–29)
Calcium: 9 mg/dL (ref 8.6–10.2)
Chloride: 104 mmol/L (ref 96–106)
Creatinine, Ser: 1.37 mg/dL — ABNORMAL HIGH (ref 0.76–1.27)
GFR calc Af Amer: 56 mL/min/{1.73_m2} — ABNORMAL LOW (ref >59)
GFR calc non Af Amer: 48 mL/min/{1.73_m2} — ABNORMAL LOW (ref >59)
Glucose: 81 mg/dL (ref 65–99)
Potassium: 4.2 mmol/L (ref 3.5–5.2)
Sodium: 139 mmol/L (ref 134–144)

## 2019-12-25 LAB — CBC WITH DIFFERENTIAL/PLATELET
Basophils Absolute: 0 10*3/uL (ref 0.0–0.2)
Basos: 1 %
EOS (ABSOLUTE): 0.1 10*3/uL (ref 0.0–0.4)
Eos: 2 %
Hematocrit: 41.1 % (ref 37.5–51.0)
Hemoglobin: 13 g/dL (ref 13.0–17.7)
Immature Grans (Abs): 0 10*3/uL (ref 0.0–0.1)
Immature Granulocytes: 0 %
Lymphocytes Absolute: 2 10*3/uL (ref 0.7–3.1)
Lymphs: 36 %
MCH: 26.6 pg (ref 26.6–33.0)
MCHC: 31.6 g/dL (ref 31.5–35.7)
MCV: 84 fL (ref 79–97)
Monocytes Absolute: 0.6 10*3/uL (ref 0.1–0.9)
Monocytes: 11 %
Neutrophils Absolute: 2.7 10*3/uL (ref 1.4–7.0)
Neutrophils: 50 %
Platelets: 188 10*3/uL (ref 150–450)
RBC: 4.88 x10E6/uL (ref 4.14–5.80)
RDW: 15.6 % — ABNORMAL HIGH (ref 11.6–15.4)
WBC: 5.4 10*3/uL (ref 3.4–10.8)

## 2019-12-25 MED ORDER — LISINOPRIL 5 MG PO TABS: 5.0000 mg | ORAL_TABLET | Freq: Every day | ORAL | 0 refills | Status: DC

## 2019-12-25 NOTE — Telephone Encounter (Signed)
Called patient using [employer] ID # [id number].  Omar Sanders, Omar Sanders

## 2019-12-25 NOTE — Telephone Encounter (Signed)
Please let [name] know that his blood counts look good and his kidney function looks good as well. I sent in a prescription for the 5mg  lisinopril. His continued fatigue is likely just a side effect of having covid-19 and will generally take 1-3 months to resolve. He will need to follow up in 1 month for a recheck with his pcp.  Guadalupe Dawn MD PGY-3 Family Medicine Resident

## 2020-01-19 NOTE — Progress Notes (Signed)
SUBJECTIVE:   CHIEF COMPLAINT / HPI:  iPad [place] interpreter used throughout encounter.  Of note there was significant language barrier during this visit.  Interpreter and patient were having a difficult time understanding each other.  Headaches/dizziness Patient presenting for concern of dizziness as well as headache.  Reports this has been occurring "a long time".  For the past 2 months it has been worse.  Reports it is not all the time but is also constant.  Reports pain is not every day but feels constant.  Difficult to assess and I think this may be related to a language barrier.  States dizziness does not always occur with headache but does occur frequently.  Denies any head trauma.  Denies any loss of consciousness.  Denies any chest pain or palpitations at time of event.  States he drinks 4-5 water bottles a day.  These are "normal size".  Referrals  Patient brings in paperwork today for "referrals".  Brings in a paper for his nephrologist that reads referral for imaging.  Is wanting a referral for nephrology for this.  Brings in a card for gastroenterologist and renal question referral as he is due for colonoscopy.  Hypertension: - Medications: lisinopril 5mg  - Compliance: good - Checking BP at home: no - Denies any SOB, CP, vision changes, LE edema, medication SEs, or symptoms of hypotension - Diet: vegetables, meat, brown rice  - Exercise: a little bit   Diabetes  Fasting checks: does not check  Post prandial does not check   Compliance: good  Diet: see above   Exercise: see above  Eye exam: due Foot exam: UTD  A1C: 6.1 Symptoms: no symptoms of hypoglycemia. no symptoms of  polyuria, polydipsia. no numbness in extremities, and no foot ulcers/trauma Meds: diet controlled Pneumonia vaccine: UTD Urine micro albumin:creatine ratio: on ACEI Statin: atorvastatin 40mg  daily The ASCVD Risk score Mikey Bussing DC Jr., et al., 2013) failed to calculate for the following  reasons:   The 2013 ASCVD risk score is only valid for ages 72 to 79   Monitoring Labs and Parameters Last A1C:  Lab Results  Component Value Date   HGBA1C 6.1 01/20/2020   Last Lipid:     Component Value Date/Time   CHOL 130 04/15/2019 1434   HDL 33 (L) 04/15/2019 1434   Last Bmet  Potassium  Date Value Ref Range Status  01/20/2020 4.2 3.5 - 5.2 mmol/L Final   Sodium  Date Value Ref Range Status  01/20/2020 139 134 - 144 mmol/L Final   Creat  Date Value Ref Range Status  07/31/2016 1.55 (H) 0.70 - 1.18 mg/dL Final    Comment:      For patients > or = 81 years of age: The upper reference limit for Creatinine is approximately 13% higher for people identified as African-American.      Creatinine, Ser  Date Value Ref Range Status  01/20/2020 1.46 (H) 0.76 - 1.27 mg/dL Final      PERTINENT  PMH / PSH: RBBB, aortic aneursym, HTN, GERD, diabetes, BRBPR, diabetes, CKD, BPH, h/o colonic polyps   OBJECTIVE:   BP 125/75   Pulse 63   Wt 175 lb 3.2 oz (79.5 kg)   SpO2 99%   BMI 27.44 kg/m   Gen: awake and alert, NAD  Cardio: RRR, no MRG Resp: CTAB, no wheezes, rales, or rhonchi  GI: soft, non tender, non distended, bowel sounds present Ext: no edema Neuro: Difficult exam secondary to language barrier.  It  was very difficult for patient to follow commands as he was unable to understand the interpreter.  No gross deficits.  Sensation intact.  No finger-to-nose dysmetria.  Normal gait.  Cranial nerves intact  ASSESSMENT/PLAN:   Dizziness Unclear etiology at this time.  Appears to have both headache as well as dizziness.  Orthostatics as follows: lying 123/78 55bpm, sitting 108/62 60bpm, standing 105/64 66 bpm.  Can consider this as a possible cause.  Given patient's age and history of left bundle branch block as well as aortic aneurysm will need to rule out cardiac etiology as well.  Will refer to cardiology for work-up.  Advised to continue drink plenty of water.  Neuro  exam within normal limits which is reassuring.  If cardiac work-up is negative can consider referral to neurology for further work-up.  Symptoms have been going on for at least 2 months or longer at this time so unlikely acute stroke.  Strict return precautions given.  Follow-up in 1 month.  Chronic kidney disease Follow-up with nephrology.  Referral paperwork that he brings me says nephrology was trying to send him for some sort of imaging.  It is unclear what this imaging is or what it is for.  I advised him to please call his nephrologist to clarify this.  He reports he will make a follow-up appointment.  Healthcare maintenance Referral for colonoscopy placed.  Essential hypertension Well-controlled on current regimen.  We will plan to continue current medications of lisinopril.  Strict return precautions given.  Follow-up in 3 months.  Diabetes ([place]) Well-controlled on diet alone.  A1c is improved to 6.1.  Advised to continue healthy diet and daily exercise.  Patient needs to make appointment with ophthalmology.  Follow-up in 3 to 6 months.     Caroline More, [place]

## 2020-01-20 ENCOUNTER — Ambulatory Visit (INDEPENDENT_AMBULATORY_CARE_PROVIDER_SITE_OTHER): Payer: Medicare Other | Admitting: Family Medicine

## 2020-01-20 ENCOUNTER — Other Ambulatory Visit: Payer: Self-pay

## 2020-01-20 VITALS — BP 125/75 | HR 63 | Wt 175.2 lb

## 2020-01-20 DIAGNOSIS — N183 Chronic kidney disease, stage 3 unspecified: Secondary | ICD-10-CM | POA: Diagnosis not present

## 2020-01-20 DIAGNOSIS — Z0000 Encounter for general adult medical examination without abnormal findings: Secondary | ICD-10-CM | POA: Diagnosis not present

## 2020-01-20 DIAGNOSIS — Z1211 Encounter for screening for malignant neoplasm of colon: Secondary | ICD-10-CM

## 2020-01-20 DIAGNOSIS — I10 Essential (primary) hypertension: Secondary | ICD-10-CM

## 2020-01-20 DIAGNOSIS — E1122 Type 2 diabetes mellitus with diabetic chronic kidney disease: Secondary | ICD-10-CM | POA: Diagnosis not present

## 2020-01-20 DIAGNOSIS — R42 Dizziness and giddiness: Secondary | ICD-10-CM | POA: Diagnosis not present

## 2020-01-20 LAB — POCT GLYCOSYLATED HEMOGLOBIN (HGB A1C): HbA1c, POC (controlled diabetic range): 6.1 % (ref 0.0–7.0)

## 2020-01-20 NOTE — Patient Instructions (Signed)
For your dizziness:  1. I have referred you to cardiology. They will call you to set up an appointment.  2. I have gotten lab work. We will call with results.   For your colonoscopy: 1. I have referred you to GI  For you kidney: 1. Please call your kidney doctor as they want you to obtain an image of your kidney   ??i v?i chng m?t c?a b?n: 1. Ti ? gi?i thi?u b?n ??n khoa tim m?ch. H? s? g?i cho b?n ?? thi?t l?p m?t cu?c h?n. 2. Ti ? nh?n ???c cng vi?c trong phng th nghi?m. Chng ti s? g?i ?i?n thng bo k?t qu?.  ??i v?i n?i soi ??i trng c?a b?n: 1. Ti ? gi?i thi?u b?n ??n GI  ??i v?i b?n th?n: 1. Vui lng g?i cho bc s? th?n c?a b?n khi h? mu?n b?n c ???c hnh ?nh v? th?n c?a b?n  Transportation Resources: Medicaid Transportation: Must have Medicaid, will transport to medical appointments and pharmacy.  Call Department of Social Services to register and schedule ride. . [phone]  or [place] [employer] of Social Services for more information [phone] . ([place] [employer]/ [employer]) . Rural Film/video editor . Limited employment related transportation   Medicare Transportation-  Please contact your provider to see if they offer transportation to medical appointments. The phone number is listed on the back of your card.  SCAT: Only in [place] limits. Pay 1.50 per way.   Must complete an application and meet medical guidelines.   Call [phone] or visit  [email or web address].aspx?[email or web address]  [employer] ([employer]) offers Discount Bus Fare to riders with Medicaid or Medicare Care   call   [phone] for discount price. ______________________________________________________________________  Nurse, children's   [employer] 61 years and older Department of Social Services  [phone]  [employer] Medical Transportation Age 59 and  older [phone] [place] for non-medical rides,  Age 26 or older   [phone]  [employer] only --Dial a Ride for medical and non-medical,  Age 20 or older [place]:   [phone]

## 2020-01-20 NOTE — Assessment & Plan Note (Addendum)
Unclear etiology at this time.  Appears to have both headache as well as dizziness.  Orthostatics as follows: lying 123/78 55bpm, sitting 108/62 60bpm, standing 105/64 66 bpm.  Can consider this as a possible cause.  Given patient's age and history of left bundle branch block as well as aortic aneurysm will need to rule out cardiac etiology as well.  Will refer to cardiology for work-up.  Advised to continue drink plenty of water.  Neuro exam within normal limits which is reassuring.  If cardiac work-up is negative can consider referral to neurology for further work-up.  Symptoms have been going on for at least 2 months or longer at this time so unlikely acute stroke.  Strict return precautions given.  Follow-up in 1 month.

## 2020-01-21 ENCOUNTER — Telehealth: Payer: Self-pay | Admitting: *Deleted

## 2020-01-21 ENCOUNTER — Encounter: Payer: Self-pay | Admitting: Family Medicine

## 2020-01-21 LAB — BASIC METABOLIC PANEL
BUN/Creatinine Ratio: 11 (ref 10–24)
BUN: 16 mg/dL (ref 8–27)
CO2: 22 mmol/L (ref 20–29)
Calcium: 9.2 mg/dL (ref 8.6–10.2)
Chloride: 105 mmol/L (ref 96–106)
Creatinine, Ser: 1.46 mg/dL — ABNORMAL HIGH (ref 0.76–1.27)
GFR calc Af Amer: 52 mL/min/{1.73_m2} — ABNORMAL LOW (ref >59)
GFR calc non Af Amer: 45 mL/min/{1.73_m2} — ABNORMAL LOW (ref >59)
Glucose: 98 mg/dL (ref 65–99)
Potassium: 4.2 mmol/L (ref 3.5–5.2)
Sodium: 139 mmol/L (ref 134–144)

## 2020-01-21 LAB — CBC
Hematocrit: 44.7 % (ref 37.5–51.0)
Hemoglobin: 14.1 g/dL (ref 13.0–17.7)
MCH: 26.5 pg — ABNORMAL LOW (ref 26.6–33.0)
MCHC: 31.5 g/dL (ref 31.5–35.7)
MCV: 84 fL (ref 79–97)
Platelets: 202 10*3/uL (ref 150–450)
RBC: 5.32 x10E6/uL (ref 4.14–5.80)
RDW: 17.4 % — ABNORMAL HIGH (ref 11.6–15.4)
WBC: 6.9 10*3/uL (ref 3.4–10.8)

## 2020-01-21 NOTE — Telephone Encounter (Signed)
-----   Message from Caroline More, DO sent at 01/21/2020  8:40 AM EDT ----- Please inform patient that results of labs are stable. His Cr is slightly elevated but he was advised to f/u with nephrology during his appointment.

## 2020-01-21 NOTE — Telephone Encounter (Signed)
Letter sent  Caroline More, DO, PGY-3 [facility] 01/21/2020 2:23 PM

## 2020-01-21 NOTE — Telephone Encounter (Signed)
Pt informed and wants results sent to his house. Do you know how to put them in a letter? Delray Alt, CMA

## 2020-01-21 NOTE — Assessment & Plan Note (Signed)
Follow-up with nephrology.  Referral paperwork that he brings me says nephrology was trying to send him for some sort of imaging.  It is unclear what this imaging is or what it is for.  I advised him to please call his nephrologist to clarify this.  He reports he will make a follow-up appointment.

## 2020-01-21 NOTE — Assessment & Plan Note (Signed)
Well-controlled on current regimen.  We will plan to continue current medications of lisinopril.  Strict return precautions given.  Follow-up in 3 months.

## 2020-01-21 NOTE — Assessment & Plan Note (Signed)
Well-controlled on diet alone.  A1c is improved to 6.1.  Advised to continue healthy diet and daily exercise.  Patient needs to make appointment with ophthalmology.  Follow-up in 3 to 6 months.

## 2020-01-21 NOTE — Assessment & Plan Note (Signed)
Referral for colonoscopy placed. ?

## 2020-01-27 ENCOUNTER — Ambulatory Visit
Admission: RE | Admit: 2020-01-27 | Discharge: 2020-01-27 | Disposition: A | Discharge status: Home / Self Care | Payer: Medicare Other | Source: Ambulatory Visit | Attending: Nephrology | Admitting: Nephrology

## 2020-01-27 DIAGNOSIS — N183 Chronic kidney disease, stage 3 unspecified: Secondary | ICD-10-CM

## 2020-01-27 DIAGNOSIS — N189 Chronic kidney disease, unspecified: Secondary | ICD-10-CM | POA: Diagnosis not present

## 2020-02-06 ENCOUNTER — Encounter: Payer: Self-pay | Admitting: Family Medicine

## 2020-02-26 ENCOUNTER — Encounter: Payer: Self-pay | Admitting: Internal Medicine

## 2020-02-27 ENCOUNTER — Encounter: Payer: Self-pay | Admitting: Internal Medicine

## 2020-03-01 NOTE — Progress Notes (Signed)
Cardiology Office Note:   Date:  03/03/2020  NAME:  Omar Sanders    MRN: [id number] DOB:  [birth year]   PCP:  Caroline More, DO  Cardiologist:  No primary care provider on file.   Referring MD: McDiarmid, Blane Ohara, MD   Chief Complaint  Patient presents with  . Dizziness   History of Present Illness:   [name] is a 79 y.o. male with a hx of RBBB/1AVB, CKD, HTN, dilated aorta who is being seen today for the evaluation of dizziness at the request of McDiarmid, Blane Ohara, MD. Was evaluated by PCP 3/23 for dizziness. Noted to be orthostatic at that visit (123/78 lying -> 105/64 standing). 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 Dilated aorta but no aneurysm. Echo 2019 with normal LVEF and grade 1 DD. Normal MPI 2017.   He reports he is dizzy constantly.  This has been going on for years.  He has dizzy today in office.  His EKG demonstrates a normal sinus rhythm with a heart rate of 65.  He does have a first-degree block and a right bundle branch block but this does not explain his dizziness.  He is able to walk up to 20 minutes without any limitations such as chest pain or shortness of breath.  He reports that he just gets dizzy while sitting.  This is not worsened by any movement.  He can also get it with activity.  It appears to be pretty constant from what he reports to me.  He reports no shortness of breath or no lower extremity edema.  He reports no chest pain.  No recent changes to any medications.  He is a never smoker.  He is prediabetic and has hypertension.  Blood pressures well controlled today.  I did review his echocardiogram from 2019 which showed normal left-ventricular function grade 1 diastolic dysfunction.  He also had a normal myocardial perfusion imaging study in 2017.  Cr 1.46, GFR 45, Hgb 14.1, A1c 6.1  Problem List 1. 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 HTN 4. 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 HLD -T chol 130, HDL 33, LDL 68, TG 147  Past Medical  History: Past Medical History:  Diagnosis Date  . Allergy   . Anal fissure   . Anemia   . Anxiety   . Benign prostatic hyperplasia   . Bilateral lower extremity edema 03/22/2018  . Chest pain 02/2018  . Chronic pain   . Colon polyp   . GERD (gastroesophageal reflux disease)   . Headache(784.0)   . Hemorrhoids   . Hyperlipidemia    Hx: of  . Hypertension   . IBS (irritable bowel syndrome)   . Incontinent of urine    Hx:of  . Migraines   . Neuromuscular disorder ([place])    periferal neuropathy per patient  . New onset right bundle branch block (RBBB)   . OSA (obstructive sleep apnea) 11/2009   sleep study with mild OSA, pt unwilling to use CPAP  . Seizures ([place])     Past Surgical History: Past Surgical History:  Procedure Laterality Date  . 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 CATARACT EXTRACTION     bilat  . CHOLECYSTECTOMY  2008  . COLONOSCOPY  2012   Dr Collene Mares  . HEMORRHOID SURGERY     anal surgery  . INGUINAL HERNIA REPAIR Bilateral 03/11/2013   Procedure:  LAPAROSCOPIC BILATERAL INGUINAL HERNIA REPAIR;  Surgeon: Ralene Ok, MD;  Location: [facility];  Service: General;  Laterality: Bilateral;  . INSERTION OF MESH Bilateral 03/11/2013   Procedure: INSERTION OF MESH;  Surgeon: Ralene Ok, MD;  Location: [facility];  Service: General;  Laterality: Bilateral;  . NM MYOCAR PERF WALL MOTION  05/27/2009   mild iscemia mid inferio & apical regions.  . NM MYOCAR PERF WALL MOTION  11/01/2012   negative for ischemia  . US ECHOCARDIOGRAPHY  05/27/2009   mild mitral annular ca+,AOV mildly sclerotic    Current Medications: Current Meds  Medication Sig  . acetaminophen (TYLENOL) 325 MG tablet Take 2 tablets (650 mg total) by mouth every 6 (six) hours as needed for mild pain (or Fever >/= 101).  Marland Kitchen atorvastatin (LIPITOR) 40 MG tablet Take 1 tablet (40 mg total) by mouth daily.  . cycloSPORINE (RESTASIS) 0.05 % ophthalmic  emulsion Place 1 drop into both eyes 2 (two) times daily.   . famotidine (PEPCID) 20 MG tablet TAKE 1 TABLET(20 MG) BY MOUTH TWICE DAILY  . finasteride (PROSCAR) 5 MG tablet Take 1 tablet (5 mg total) by mouth daily.  . fluticasone (FLONASE) 50 MCG/ACT nasal spray Place 2 sprays into both nostrils daily.  . furosemide (LASIX) 40 MG tablet Take 40 mg by mouth.  Marland Kitchen guaiFENesin (MUCINEX) 600 MG 12 hr tablet Take 1 tablet (600 mg total) by mouth 2 (two) times daily.  Marland Kitchen lisinopril (ZESTRIL) 5 MG tablet Take 1 tablet (5 mg total) by mouth at bedtime.  . Olopatadine HCl 0.2 % SOLN Apply 1 drop to eye daily. (Patient taking differently: Place 1 drop into both eyes daily. )  . tamsulosin (FLOMAX) 0.4 MG CAPS capsule Take 2 capsules (0.8 mg total) by mouth daily after breakfast.  . triamcinolone ointment (KENALOG) 0.1 % Apply 1 application topically 2 (two) times daily.     Allergies:    Aspirin, Lubiprostone, Pantoprazole, and Vitamin c   Social History: Social History   Socioeconomic History  . Marital status: Widowed    Spouse name: Not on file  . Number of children: 5  . Years of education: Not on file  . Highest education level: Not on file  Occupational History  . Not on file  Tobacco Use  . Smoking status: Never Smoker  . Smokeless tobacco: Never Used  Substance and Sexual Activity  . Alcohol use: No  . Drug use: No  . Sexual activity: Not Currently  Other Topics Concern  . Not on file  Social History Narrative  . Not on file   Social Determinants of Health   Financial Resource Strain:   . Difficulty of Paying Living Expenses:   Food Insecurity:   . Worried About Charity fundraiser in the Last Year:   . Arboriculturist in the Last Year:   Transportation Needs: Unmet Transportation Needs  . Lack of Transportation (Medical): Yes  . Lack of Transportation (Non-Medical): Yes  Physical Activity:   . Days of Exercise per Week:   . Minutes of Exercise per Session:   Stress:     . Feeling of Stress :   Social Connections:   . Frequency of Communication with Friends and Family:   . Frequency of Social Gatherings with Friends and Family:   . Attends Religious Services:   . Active Member of Clubs or Organizations:   . Attends Archivist Meetings:   Marland Kitchen Marital Status:  Family History: The patient's family history includes Hypertension in his mother. There is no history of Colon cancer.  ROS:   All other ROS reviewed and negative. Pertinent positives noted in the HPI.     EKGs/Labs/Other Studies Reviewed:   The following studies were personally reviewed by me today:  EKG:  EKG is ordered today.  The ekg ordered today demonstrates sinus rhythm, heart rate 65, first-degree AV block, PR interval 286, right bundle branch block noted, and was personally reviewed by me.   Echo 03/22/2018 - Left ventricle: The cavity size was normal. Wall thickness was  increased in a pattern of mild LVH. Systolic function was normal.  The estimated ejection fraction was in the range of 55% to 60%.  Wall motion was normal; there were no regional wall motion  abnormalities. Doppler parameters are consistent with abnormal  left ventricular relaxation (grade 1 diastolic dysfunction). The  E/e&' ratio is between 8-15, suggesting indeterminate LV filling  pressure.  - Aorta: Aortic root dimension: 43 mm (ED). Ascending aortic  diameter: 40 mm (S).  - Ascending aorta: The ascending aorta was mildly dilated.  - Mitral valve: Mildly thickened leaflets . There was trivial  regurgitation.  - Left atrium: The atrium was normal in size.  - Right atrium: The atrium was mildly dilated.  - Tricuspid valve: There was trivial regurgitation.  - Pulmonary arteries: PA peak pressure: 23 mm Hg (S).  - Inferior vena cava: The vessel was normal in size. The  respirophasic diameter changes were in the normal range (= 50%),  consistent with normal central venous  pressure.   NM SPECT 2017 - > normal  Recent Labs: 11/22/2019: ALT 25 01/20/2020: BUN 16; Creatinine, Ser 1.46; Hemoglobin 14.1; Platelets 202; Potassium 4.2; Sodium 139   Recent Lipid Panel    Component Value Date/Time   CHOL 130 04/15/2019 1434   TRIG 147 04/15/2019 1434   HDL 33 (L) 04/15/2019 1434   CHOLHDL 3.9 04/15/2019 1434   CHOLHDL 7.0 (H) 07/12/2015 0938   VLDL 57 (H) 07/12/2015 0938   LDLCALC 68 04/15/2019 1434    Physical Exam:   VS:  BP 132/70   Pulse 65   Ht 5\' 7"  (1.702 m)   Wt 176 lb 12.8 oz (80.2 kg)   BMI 27.69 kg/m    Wt Readings from Last 3 Encounters:  03/03/20 176 lb 12.8 oz (80.2 kg)  03/03/20 177 lb 6.4 oz (80.5 kg)  01/20/20 175 lb 3.2 oz (79.5 kg)    General: Well nourished, well developed, in no acute distress Heart: Atraumatic, normal size  Eyes: PEERLA, EOMI  Neck: Supple, no JVD Endocrine: No thryomegaly Cardiac: Normal S1, S2; RRR; no murmurs, rubs, or gallops Lungs: Clear to auscultation bilaterally, no wheezing, rhonchi or rales  Abd: Soft, nontender, no hepatomegaly  Ext: No edema, pulses 2+ Musculoskeletal: No deformities, BUE and BLE strength normal and equal Skin: Warm and dry, no rashes   Neuro: Alert and oriented to person, place, time, and situation, CNII-XII grossly intact, no focal deficits  Psych: Normal mood and affect   ASSESSMENT:   ELON SWETLAND is a 79 y.o. male who presents for the following: 1. Dizziness   2. RBBB   3. First degree AV block   4. Essential hypertension   5. Aortic dilatation (HCC)     PLAN:   1. Dizziness 2. RBBB 3. First degree AV block -He reports he is constantly dizzy.  Not worsened by movement or exertion.  He has no limitations with walking up to 20 minutes per his report.  His EKG does demonstrate a first-degree AV block and right bundle branch block.  He reported he was dizzy in our office constantly with a heart rate of 65.  There is no cardiac explanation for his dizziness.  He did have  a normal stress test in 2017 and a normal echocardiogram in 2019.  I suspect he possibly has an inner ear problem or vertigo to explain his dizziness.  I would recommend he be evaluated by ENT and it appears that he is going to be referred to neurology by his primary care physician.  Moving forward he should avoid any AV nodal agents such as beta-blockers or calcium channel blockers.  These will worsen his conduction.  Given his lack of symptoms with exertion I see no need for any further testing at this time.  His recent blood work is unremarkable.  He does need an updated thyroid study which his primary care physician can obtain but there is no real reason for further testing given his relatively benign EKG and symptoms which are inconsistent with dizziness caused by cardiac etiology.  4. Essential hypertension -Continue current medications.  5. Aortic Dilation  -Review of recent CT scan shows an ascending aorta of 4.0 cm on cross-sectional CT in 2019..  This is likely the upper limits of normal for his age.  This is not represent an aortic aneurysm.  This is not the cause of his dizziness.  Disposition: Return if symptoms worsen or fail to improve.  Medication Adjustments/Labs and Tests Ordered: Current medicines are reviewed at length with the patient today.  Concerns regarding medicines are outlined above.  Orders Placed This Encounter  Procedures  . EKG 12-Lead   No orders of the defined types were placed in this encounter.   Patient Instructions  Medication Instructions:  The current medical regimen is effective;  continue present plan and medications.  *If you need a refill on your cardiac medications before your next appointment, please call your pharmacy*   Follow-Up: At [facility], you and your health needs are our priority.  As part of our continuing mission to provide you with exceptional heart care, we have created designated Provider Care Teams.  These Care Teams include  your primary Cardiologist (physician) and Advanced Practice Providers (APPs -  Physician Assistants and Nurse Practitioners) who all work together to provide you with the care you need, when you need it.  We recommend signing up for the patient portal called "MyChart".  Sign up information is provided on this After Visit Summary.  MyChart is used to connect with patients for Virtual Visits (Telemedicine).  Patients are able to view lab/test results, encounter notes, upcoming appointments, etc.  Non-urgent messages can be sent to your provider as well.   To learn more about what you can do with MyChart, go to [email or web address].    Your next appointment:   As needed  The format for your next appointment:   In Person  Provider:   Eleonore Chiquito, MD        Signed, Addison Naegeli. Audie Box, [place]  [address] [place], [place] [address] [phone]  03/03/2020 2:15 PM

## 2020-03-03 ENCOUNTER — Ambulatory Visit (INDEPENDENT_AMBULATORY_CARE_PROVIDER_SITE_OTHER): Payer: Medicare Other | Admitting: Family Medicine

## 2020-03-03 ENCOUNTER — Encounter: Payer: Self-pay | Admitting: Cardiovascular Disease

## 2020-03-03 ENCOUNTER — Other Ambulatory Visit: Payer: Self-pay

## 2020-03-03 ENCOUNTER — Encounter: Payer: Self-pay | Admitting: Family Medicine

## 2020-03-03 ENCOUNTER — Ambulatory Visit (INDEPENDENT_AMBULATORY_CARE_PROVIDER_SITE_OTHER): Payer: Medicare Other | Admitting: Cardiovascular Disease

## 2020-03-03 VITALS — BP 132/70 | HR 65 | Ht 67.0 in | Wt 176.8 lb

## 2020-03-03 DIAGNOSIS — R42 Dizziness and giddiness: Secondary | ICD-10-CM

## 2020-03-03 DIAGNOSIS — I4510 Unspecified right bundle-branch block: Secondary | ICD-10-CM | POA: Diagnosis not present

## 2020-03-03 DIAGNOSIS — I440 Atrioventricular block, first degree: Secondary | ICD-10-CM | POA: Diagnosis not present

## 2020-03-03 DIAGNOSIS — I77819 Aortic ectasia, unspecified site: Secondary | ICD-10-CM | POA: Diagnosis not present

## 2020-03-03 DIAGNOSIS — I10 Essential (primary) hypertension: Secondary | ICD-10-CM

## 2020-03-03 DIAGNOSIS — L209 Atopic dermatitis, unspecified: Secondary | ICD-10-CM | POA: Diagnosis not present

## 2020-03-03 MED ORDER — LISINOPRIL 5 MG PO TABS: 5.0000 mg | ORAL_TABLET | Freq: Every day | ORAL | 0 refills | Status: DC

## 2020-03-03 MED ORDER — TRIAMCINOLONE ACETONIDE 0.1 % EX OINT: 1.0000 "application " | TOPICAL_OINTMENT | Freq: Two times a day (BID) | CUTANEOUS | 1 refills | Status: DC

## 2020-03-03 NOTE — Patient Instructions (Addendum)
1. For your rash you can use triamcinolone ointment. Please note that this can cause some skin lightening.  If your rash does not improve in 2 weeks please follow-up.  2.  For your blood pressure see Dr. Valentina Lucks for ambulatory blood pressure monitoring if your blood pressure gets very high please call our office immediately after you have the doctor on call 24/7. 3.  For your dizziness please follow-up with cardiology.  Her cardiac work-up is negative I will send you to a neurologist.  1. ??i v?i pht ban c?a b?n, b?n c th? s? d?ng thu?c m? triamcinolone. Xin l?u  r?ng ?i?u ny c th? gy ra m?t s? lm sng da. N?u pht ban c?a b?n khng c?i thi?n trong 2 tu?n, vui lng ti khm. 2. ?? bi?t huy?t p c?a b?n, hy g?p bc s? [place] ?? theo di huy?t p l?u ??ng n?u huy?t p c?a b?n t?ng r?t cao, vui lng g?i cho v?n phng c?a chng ti ngay l?p t?c sau khi b?n c bc s? tr?c 24/7. 3. ??i v?i tnh tr?ng chng m?t c?a b?n, vui lng ti khm t?i khoa tim m?ch. K?t qu? tim c?a c ?y l m tnh, ti s? g?i b?n ??n bc s? th?n kinh.  Dr. Tammi Klippel

## 2020-03-03 NOTE — Assessment & Plan Note (Addendum)
Stable from previous. Difficult to quantify or qualify 2/2 language barrier. Patient is seeing cardiology this afternoon. If cardiac w/u is negative will plan to refer to neurology. Unlikely acute process given chronicity of issue. F/u in 1 month.   Update.  Cardiology felt that dizziness was not cardiac etiology.  They recommended referral to ENT.  Per plan above we will also refer to neurology.

## 2020-03-03 NOTE — Assessment & Plan Note (Signed)
Appears to be poorly controlled in evenings. Has been normotensive in office. Will not change medications at this time as he is well controlled her and has BP readings as low as 110 at home. Would recommend ambulatory BP monitoring with Dr. Valentina Lucks. Scheduled patient for appointment prior to leaving

## 2020-03-03 NOTE — Progress Notes (Signed)
    SUBJECTIVE:   CHIEF COMPLAINT / HPI:  Vietnamese interpretor used   Headache Patient presenting for f/u of rash. Was seen on 3/23 for HA. Patient was instructed to see cardiology but has not seen yet, has appointment scheduled for 1:40pm today. States he is unsure of frequency, could not quantify. Interpretor reports difficulty trying to assess frequency, I anticipate a language barrier being the cause. Does state he feels uncomfortable and dizzy during events.   Rash Patient reports new rash on the back of his neck. States it has been present x2 weeks. Does itch. Did change to dove soap but this was after rash presented. Is using [facility] cream. Patient is worried about shingles. No other household members with similar rash   Hypertension  Patient reports x2 weeks his blood pressures can be low and then can go high. Is compliant on medications. Watches his diet. Exercises 3-4x a week. BP goes from [id number] - [id number] systolic. BP is usually high after 12pm-8pm. Denies new headaches, dizziness, LH, CP, or SOB. Does report worsening HA and dizziness when BP is elevated.   PERTINENT  PMH / PSH: RBBB, HTN, GERD, CJD, HLD, GAD  OBJECTIVE:   BP 126/62   Pulse (!) 54   Ht 5\' 7"  (1.702 m)   Wt 177 lb 6.4 oz (80.5 kg)   SpO2 100%   BMI 27.78 kg/m   Gen: awake and alert, NAD HEENT: moist mucous membranes, PERRL, EOMI Cardio: RRR, no MRG Resp: CTAB, no wheezes, rales or rhonchi GI: soft, non tender, non distended, bowel sounds present Ext: no edema Neuro exam: no focal deficits, sensation intact, 5/5 strength, CN2-12 intact Skin: erythematous dry scaling rash behind neck  ASSESSMENT/PLAN:   Dizziness Stable from previous. Difficult to quantify or qualify 2/2 language barrier. Patient is seeing cardiology this afternoon. If cardiac w/u is negative will plan to refer to neurology. Unlikely acute process given chronicity of issue. F/u in 1 month.   Update.  Cardiology felt that dizziness was  not cardiac etiology.  They recommended referral to ENT.  Per plan above we will also refer to neurology.  Essential hypertension Appears to be poorly controlled in evenings. Has been normotensive in office. Will not change medications at this time as he is well controlled her and has BP readings as low as 110 at home. Would recommend ambulatory BP monitoring with Dr. Valentina Lucks. Scheduled patient for appointment prior to leaving  Atopic dermatitis Rash consistent with atopic dermatitis.  Advised triamcinolone ointment.  Follow-up if no improvement.     Caroline More, [place]

## 2020-03-03 NOTE — Patient Instructions (Signed)
Medication Instructions:  The current medical regimen is effective;  continue present plan and medications.  *If you need a refill on your cardiac medications before your next appointment, please call your pharmacy*    Follow-Up: At [facility], you and your health needs are our priority.  As part of our continuing mission to provide you with exceptional heart care, we have created designated Provider Care Teams.  These Care Teams include your primary Cardiologist (physician) and Advanced Practice Providers (APPs -  Physician Assistants and Nurse Practitioners) who all work together to provide you with the care you need, when you need it.  We recommend signing up for the patient portal called "MyChart".  Sign up information is provided on this After Visit Summary.  MyChart is used to connect with patients for Virtual Visits (Telemedicine).  Patients are able to view lab/test results, encounter notes, upcoming appointments, etc.  Non-urgent messages can be sent to your provider as well.   To learn more about what you can do with MyChart, go to [email or web address].    Your next appointment:   As needed  The format for your next appointment:   In Person  Provider:   Wright O'Neal, MD      

## 2020-03-04 DIAGNOSIS — L209 Atopic dermatitis, unspecified: Secondary | ICD-10-CM | POA: Insufficient documentation

## 2020-03-04 NOTE — Assessment & Plan Note (Signed)
Rash consistent with atopic dermatitis.  Advised triamcinolone ointment.  Follow-up if no improvement.

## 2020-03-11 ENCOUNTER — Ambulatory Visit (INDEPENDENT_AMBULATORY_CARE_PROVIDER_SITE_OTHER): Payer: Medicare Other | Admitting: Pharmacist

## 2020-03-11 ENCOUNTER — Other Ambulatory Visit: Payer: Self-pay

## 2020-03-11 ENCOUNTER — Encounter: Payer: Self-pay | Admitting: Pharmacist

## 2020-03-11 DIAGNOSIS — I10 Essential (primary) hypertension: Secondary | ICD-10-CM

## 2020-03-11 NOTE — Patient Instructions (Addendum)
Wearing the Blood Pressure Monitor  The cuff will inflate every 20 minutes during the day and every 30 minutes while you sleep.  Your blood pressure readings will NOT display after cuff inflation  Fill out the blood pressure-activity diary during the day, especially during activities that may affect your reading -- such as exercise, stress, walking, taking your blood pressure medications  Important things to know:  Avoid taking the monitor off for the next 24 hours, unless it causes you discomfort or pain.  Do NOT get the monitor wet and do NOT dry to clean the monitor with any cleaning products.  Do NOT put the monitor on anyone else's arm.  When the cuff inflates, avoid excess movement. Let the cuffed arm hang loosely, slightly away from the body. Avoid flexing the muscles or moving the hand/fingers.  When you go to sleep, make sure that the hose is not kinked.  Remember to fill out the blood pressure activity diary.  If you experience severe pain or unusual pain (not associated with getting your blood pressure checked), remove the monitor.  Troubleshooting:  Code  Troubleshooting   1  Check cuff position, tighten cuff   2, 3  Remain still during reading   4, 87  Check air hose connections and make sure cuff is tight   85, 89  Check hose connections and make tubing is not crimped   86  Push START/STOP to restart reading   88, 91  Retry by pushing START/STOP   90  Replace batteries. If problem persists, remove monitor and bring back to   clinic at follow up   97, 98, 99  Service required - Remove monitor and bring back to clinic at follow up     Blood Pressure Activity Diary Time Lying down/ Sleeping Walking/ Exercise Stressed/ Angry Headache/ Pain Dizzy  9 AM       10 AM       11 AM       12 PM       1 PM       2 PM       Time Lying down/ Sleeping Walking/ Exercise Stressed/ Angry Headache/ Pain Dizzy  3 PM       4 PM        5 PM       6 PM       7 PM       8 PM        Time Lying down/ Sleeping Walking/ Exercise Stressed/ Angry Headache/ Pain Dizzy  9 PM       10 PM       11 PM       12 AM       1 AM       2 AM       3 AM       Time Lying down/ Sleeping Walking/ Exercise Stressed/ Angry Headache/ Pain Dizzy  4 AM       5 AM       6 AM       7 AM       8 AM       9 AM       10 AM        Time you woke up: _________                  Time you went to sleep:__________    Come back tomorrow at  to  have the monitor removed  Call the [facility] if you have any questions before then [phone])   ?eo my ?o huy?t p Vng bt s? ph?ng ln sau m?i 20 pht trong ngy v 30 pht m?t l?n khi b?n ng?. Cc k?t qu? ?o huy?t p c?a b?n s? [address] hi?n th? sau khi l?m pht vng bt ?i?n vo nh?t k ho?t ??ng huy?t p trong ngy, ??c bi?t l trong cc ho?t ??ng c th? ?nh h??ng ??n vi?c ??c c?a b?n - ch?ng h?n nh? t?p th? d?c, c?ng th?ng, ?i b?, dng thu?c huy?t p  Nh?ng ?i?u quan tr?ng c?n bi?t: Trnh t?t mn hnh trong 24 gi? t?i, tr? khi n khi?n b?n kh ch?u ho?c ?au. [place] lm ??t mn hnh v [facility] lau kh mn hnh b?ng b?t k? s?n ph?m lm s?ch no. [place] ??t mn hnh trn cnh tay c?a b?t k? ai khc. Khi vng bt bung ra, trnh di chuy?n qu m?c. ?? cnh tay b? cng bung l?ng, cch xa c? th? m?t cht. Trnh g?p c? ho?c c? ??ng bn tay / ngn tay. Khi b?n ?i ng?, hy ??m b?o r?ng vi khng b? g?p khc. Nh? ?i?n vo nh?t k ho?t ??ng huy?t p. N?u b?n th?y ?au d? d?i ho?c ?au b?t th??ng (khng lin quan ??n vi?c ki?m tra huy?t p), hy tho mn hnh.    X? l s? c?:  Kh?c ph?c s? c? m  Code  Troubleshooting   1  Ki?m tra v? tr vng bt, th?t ch?t vng bt   2, 3  V?n n?m yn trong khi ??c   4, 87  Ki?m tra cc k?t n?i ?ng d?n kh v ??m b?o r?ng vng bt ? ???c th?t ch?t   85, 89  Ki?m tra cc k?t n?i ?ng v ??m b?o ?ng khng b? g?p khc   86  Nh?n START / STOP ?? b?t ??u ??c l?i   88, 91  Th? l?i b?ng cch nh?n START / STOP    90   Thay pin. N?u s? c? v?n ti?p di?n, hy tho mn hnh v mang tr? l?i phng khm khi theo di   97, 98, 99  Yu c?u d?ch v? - Tho mn hnh v mang tr? l?i phng khm khi theo di     Time Lying down/ Sleeping N?m xu?ng / Ng? Walking/ Exercise  ?i b? / T?p th? d?c Stressed/ Angry C?ng th?ng / Gi?n d? Headache/ Pain Nh?c ??u / ?au Dizzy  Chng m?t  9 AM       10 AM       11 AM       12 PM       1 PM       2 PM       Time Lying down/ Sleeping N?m xu?ng / Ng? Walking/ Exercise ?i b? / T?p th? d?c Stressed/ Angry C?ng th?ng / Gi?n d? Headache/ Pain Nh?c ??u / ?au Dizzy Chng m?t  3 PM       4 PM        5 PM       6 PM       7 PM       8 PM       Time Lying down/ Sleeping N?m xu?ng / Ng? Walking/ Exercise ?i b? / T?p th? d?c Stressed/ Angry C?ng th?ng / Gi?n d? Headache/ Pain Nh?c ??u / ?au Dizzy Chng m?t  9  PM       10 PM       11 PM       12 AM       1 AM       2 AM       3 AM       Time Lying down/ Sleeping N?m xu?ng / Ng? Walking/ Exercise ?i b? / T?p th? d?c Stressed/ Angry C?ng th?ng / Gi?n d? Headache/ Pain Nh?c ??u / ?au Dizzy Chng m?t  4 AM       5 AM       6 AM       7 AM       8 AM       9 AM       10 AM         Th?i gian b?n th?c d?y: _________ Th?i gian b?n ?i ng?: __________   Hy quay l?i vo ngy mai lc ?? tho mn hnh Hy g?i cho Phng khm Y h?c Gia ?nh n?u b?n c b?t k? cu h?i no tr??c ? [id number])

## 2020-03-11 NOTE — Progress Notes (Signed)
    S:     Chief Complaint  Patient presents with  . Medication Management    amb BP monitor    Patient ambulating without assistance. Visit conducted with use of [place] interpretor Shirlee Limerick [phone]). Patient reports lowest blood pressure is systolic blood pressure [id number] with highest systolic blood pressure [id number] but not often, more often highest is 150-160s. Reports highest blood pressure readings are in afternoon before lisinopril is taken. Patient reports taking lisinopril 5mg  daily in later afternoon/evening. Patient reports when blood pressure is high (SBP 190s) takes 4 pills of lisinopril 5mg  and last did this more than 1 week ago.   Insurance coverage/medication affordability: Medicare  Medication adherence reported good however, he is taking a variable dose of lisinopril based on home BP readings. Current hypertension medications include: lisinopril 5mg  daily, furosemide 40mg  daily  Current hyperlipidemia medications include: atorvastatin 40mg  daily   O:  Physical Exam   Review of Systems  HENT: Positive for congestion.        Reports Right ear congestion.  Neurological: Positive for dizziness.       Happens at variable times.   Denies orthostatic changes.   All other systems reviewed and are negative.    Lab Results  Component Value Date   HGBA1C 6.1 01/20/2020   Vitals:   03/11/20 1035  BP: 140/78  Pulse: (!) 55  SpO2: 98%    Lipid Panel     Component Value Date/Time   CHOL 130 04/15/2019 1434   TRIG 147 04/15/2019 1434   HDL 33 (L) 04/15/2019 1434   CHOLHDL 3.9 04/15/2019 1434   CHOLHDL 7.0 (H) 07/12/2015 0938   VLDL 57 (H) 07/12/2015 0938   LDLCALC 68 04/15/2019 1434   ABPM Study Data: Arm Placement: right arm   Overall Mean 24hr BP:   126/72 mmHg HR: 75   Daytime Mean BP:  130/78 mmHg HR: 78   Nighttime Mean BP:  115/58 mmHg HR: 68   Dipping Pattern: Yes.    Sys:   11.7%   Dia: 25.2%   [normal dipping ~10-20%]  Non-hypertensive ABPM  thresholds: daytime BP <125/75 mmHg, sleeptime BP <120/70 mmHg    Clinical Atherosclerotic Cardiovascular Disease (ASCVD): No  The ASCVD Risk score Mikey Bussing DC Jr., et al., 2013) failed to calculate for the following reasons:   The 2013 ASCVD risk score is only valid for ages 70 to 27   PHQ2 = 0 conducted by Cristela Felt with video interpreter.   A/P:  Hypertension longstanding currently controlled per 24-hour ambulatory blood pressure report. Blood pressure goal = < 130/80 mmHg. Medication adherence good, however, patient reports taking four 5-mg lisinopril pills when BP is high in the 190's. Patient is still concerned about high blood pressure spikes.  - Continued lisinopril 5 mg daily. Discussed with patient to continue taking only one 5 mg lisinopril tablet daily.  - Encouraged patient to set up follow-up appointment with Dr. Tammi Klippel for further discussion on blood pressure and what could be causing spikes noted at home (not identified on our ambulatory testing).  Written patient instructions provided.  Total time in face to face counseling 30 minutes.   Follow up with PCP in the next month. Patient seen with Domingo Pulse, PharmD Candidate, Sherre Poot, PharmD Candidate and Cristela Felt, PharmD PGY-1 Resident.

## 2020-03-12 NOTE — Assessment & Plan Note (Signed)
Hypertension longstanding currently controlled per 24-hour ambulatory blood pressure report. Blood pressure goal = < 130/80 mmHg. Medication adherence good, however, patient reports taking four 5-mg lisinopril pills when BP is high in the 190's. Patient is still concerned about high blood pressure spikes.  - Continued lisinopril 5 mg daily. Discussed with patient to continue taking only one 5 mg lisinopril tablet daily.  - Encouraged patient to set up follow-up appointment with Dr. Tammi Klippel for further discussion on blood pressure and what could be causing spikes noted at home (not identified on our ambulatory testing).

## 2020-03-12 NOTE — Progress Notes (Signed)
Reviewed: I agree with Dr. Koval's documentation and management. 

## 2020-04-05 ENCOUNTER — Other Ambulatory Visit: Payer: Self-pay | Admitting: Family Medicine

## 2020-04-05 ENCOUNTER — Telehealth: Payer: Self-pay | Admitting: *Deleted

## 2020-04-05 MED ORDER — CETIRIZINE HCL 10 MG PO TABS: 10.0000 mg | ORAL_TABLET | Freq: Every day | ORAL | 0 refills | Status: DC

## 2020-04-05 NOTE — Telephone Encounter (Signed)
RX sent  Caroline More, DO, PGY-3 [facility] 04/05/2020 1:40 PM

## 2020-04-05 NOTE — Telephone Encounter (Signed)
Rx request for cetirizine 10mg  tabs. Not on med list. Please advise. Cambre Matson Kennon Holter, CMA

## 2020-04-05 NOTE — Progress Notes (Signed)
Cr Cl 47 based on labs from 12/2019. No dose adjustment needed for cetirizine

## 2020-04-14 ENCOUNTER — Ambulatory Visit: Payer: Medicare Other | Admitting: Family Medicine

## 2020-04-14 NOTE — Progress Notes (Signed)
    SUBJECTIVE:   CHIEF COMPLAINT / HPI:  [place] interpretor used throughout encounter   Sore throat/cough Patient reports he thinks he has acid reflux which is causing coughing and itching throat. Patient reports symptoms have been present "long time". Reports he has stomach problems for years. Reports burning chest pain prior to coughing and sore throat. No sour taste. Symptoms are worsened with spicy foods. Patient has RX for pepcid which he reports compliance but still having reflux symptoms. States he can feel the acid coming up to his throat.   Constipation  Patient reports constipation is chronic. Has been present for years. Does report taking laxative, at first it helped but not anymore. Patient reports BM daily. States he is unable to have a BM daily only with laxative. Reports stool is hard both small and large pellets. Dark brown.   PERTINENT  PMH / PSH: RBBB, HTN, IBS, GERD, CKD  OBJECTIVE:   BP 120/62   Pulse 76   Ht 5\' 7"  (1.702 m)   Wt 176 lb 8 oz (80.1 kg)   SpO2 96%   BMI 27.64 kg/m   Gen: awake and alert, NAD Cardio: RRR, no MRG Resp: CTAB, no wheezes, rales or rhonchi GI: soft, non tender, non distended, bowel sounds present Ext: no edema  Depression screen [facility] 2/9 04/15/2020 03/03/2020 07/15/2019 06/09/2019 04/15/2019  Decreased Interest 0 0 0 0 0  Down, Depressed, Hopeless 0 0 0 0 0  PHQ - 2 Score 0 0 0 0 0  Some recent data might be hidden     ASSESSMENT/PLAN:   GERD Patient symptoms consistent with his previous GERD.  Chest pain, coughing, sore throat is worsened with spicy foods.  Has been present for "long time", probably years.  Seems to not have any improvement with H2 blocker.  Previously has not been able to take Protonix due to "allergy".  When I asked him what his symptoms were he said he had a stomach ache.  Patient is willing to try another PPI.  Shared decision making with patient to try PPI.  Will give Nexium.  Advised to follow-up in 1 month  to ensure improvement.  Constipation Appears to have daily bowel movements with laxatives however they are very firm.  Advised daily MiraLAX use as this will help soften stools and allow for easier bowel movements.  Follow-up if no improvement.     Caroline More, [place]

## 2020-04-15 ENCOUNTER — Other Ambulatory Visit: Payer: Self-pay

## 2020-04-15 ENCOUNTER — Encounter: Payer: Self-pay | Admitting: Family Medicine

## 2020-04-15 ENCOUNTER — Ambulatory Visit (INDEPENDENT_AMBULATORY_CARE_PROVIDER_SITE_OTHER): Payer: Medicare Other | Admitting: Family Medicine

## 2020-04-15 VITALS — BP 120/62 | HR 76 | Ht 67.0 in | Wt 176.5 lb

## 2020-04-15 DIAGNOSIS — K5900 Constipation, unspecified: Secondary | ICD-10-CM | POA: Diagnosis not present

## 2020-04-15 DIAGNOSIS — K219 Gastro-esophageal reflux disease without esophagitis: Secondary | ICD-10-CM

## 2020-04-15 DIAGNOSIS — E1122 Type 2 diabetes mellitus with diabetic chronic kidney disease: Secondary | ICD-10-CM

## 2020-04-15 LAB — POCT GLYCOSYLATED HEMOGLOBIN (HGB A1C): HbA1c, POC (controlled diabetic range): 6.5 % (ref 0.0–7.0)

## 2020-04-15 MED ORDER — POLYETHYLENE GLYCOL 3350 17 GM/SCOOP PO POWD: 17.0000 g | Freq: Every day | ORAL | 1 refills | Status: DC

## 2020-04-15 MED ORDER — ESOMEPRAZOLE MAGNESIUM 20 MG PO PACK: 20.0000 mg | PACK | Freq: Every day | ORAL | 0 refills | Status: DC

## 2020-04-15 NOTE — Patient Instructions (Addendum)
1. Please take miralax daily for constipation  2. Please take nexium daily for acid reflux 3. Follow up in 1 month

## 2020-04-15 NOTE — Assessment & Plan Note (Signed)
Patient symptoms consistent with his previous GERD.  Chest pain, coughing, sore throat is worsened with spicy foods.  Has been present for "long time", probably years.  Seems to not have any improvement with H2 blocker.  Previously has not been able to take Protonix due to "allergy".  When I asked him what his symptoms were he said he had a stomach ache.  Patient is willing to try another PPI.  Shared decision making with patient to try PPI.  Will give Nexium.  Advised to follow-up in 1 month to ensure improvement.

## 2020-04-15 NOTE — Assessment & Plan Note (Signed)
Appears to have daily bowel movements with laxatives however they are very firm.  Advised daily MiraLAX use as this will help soften stools and allow for easier bowel movements.  Follow-up if no improvement.

## 2020-04-22 ENCOUNTER — Ambulatory Visit (AMBULATORY_SURGERY_CENTER): Payer: Self-pay | Admitting: *Deleted

## 2020-04-22 ENCOUNTER — Other Ambulatory Visit: Payer: Self-pay

## 2020-04-22 VITALS — Ht 67.0 in | Wt 176.0 lb

## 2020-04-22 DIAGNOSIS — Z86010 Personal history of colonic polyps: Secondary | ICD-10-CM

## 2020-04-22 DIAGNOSIS — Z01818 Encounter for other preprocedural examination: Secondary | ICD-10-CM

## 2020-04-22 MED ORDER — SUPREP BOWEL PREP KIT 17.5-3.13-1.6 GM/177ML PO SOLN: 1.0000 | Freq: Once | ORAL | 0 refills | Status: AC

## 2020-04-22 NOTE — Progress Notes (Signed)
No egg or soy allergy known to patient  No issues with past sedation with any surgeries  or procedures, no intubation problems  No diet pills per patient No home 02 use per patient  No blood thinners per patient  Pt states  issues with constipation - uses Gentle lax prn and it helps-  No A fib or A flutter  EMMI video sent to pt's e mail  COVID 19 guidelines implemented in [place] today with pt and interpreter   Pt states he has an issue with a driver for the procedure being here 2-3 hours and getting him to his covid test - explained if no covid test, we cannot do his procedure 7-8 and if no care partner , we will not do his procedure 7-8 as well- pt verbalized understanding and states he will ask a friend or daughter and will call if he has to cancel     Due to the COVID-19 pandemic we are asking patients to follow these guidelines. Please only bring one care partner. Please be aware that your care partner may wait in the car in the parking lot or if they feel like they will be too hot to wait in the car, they may wait in the lobby on the 4th floor. All care partners are required to wear a mask the entire time (we do not have any that we can provide them), they need to practice social distancing, and we will do a Covid check for all patient's and care partners when you arrive. Also we will check their temperature and your temperature. If the care partner waits in their car they need to stay in the parking lot the entire time and we will call them on their cell phone when the patient is ready for discharge so they can bring the car to the front of the building. Also all patient's will need to wear a mask into building.

## 2020-05-04 ENCOUNTER — Ambulatory Visit (INDEPENDENT_AMBULATORY_CARE_PROVIDER_SITE_OTHER): Payer: Medicare Other

## 2020-05-04 ENCOUNTER — Encounter: Payer: Medicare Other | Admitting: Internal Medicine

## 2020-05-04 ENCOUNTER — Other Ambulatory Visit: Payer: Self-pay | Admitting: Internal Medicine

## 2020-05-04 DIAGNOSIS — Z1159 Encounter for screening for other viral diseases: Secondary | ICD-10-CM | POA: Diagnosis not present

## 2020-05-05 LAB — SARS CORONAVIRUS 2 (TAT 6-24 HRS): SARS Coronavirus 2: NEGATIVE

## 2020-05-06 ENCOUNTER — Other Ambulatory Visit: Payer: Self-pay

## 2020-05-06 ENCOUNTER — Ambulatory Visit (AMBULATORY_SURGERY_CENTER): Payer: Medicare Other | Admitting: Internal Medicine

## 2020-05-06 ENCOUNTER — Encounter: Payer: Self-pay | Admitting: Internal Medicine

## 2020-05-06 VITALS — BP 134/78 | HR 55 | Temp 96.6°F | Resp 13 | Ht 67.0 in | Wt 176.0 lb

## 2020-05-06 DIAGNOSIS — Z86010 Personal history of colonic polyps: Secondary | ICD-10-CM | POA: Diagnosis not present

## 2020-05-06 DIAGNOSIS — D122 Benign neoplasm of ascending colon: Secondary | ICD-10-CM | POA: Diagnosis not present

## 2020-05-06 MED ORDER — SODIUM CHLORIDE 0.9 % IV SOLN: 500.0000 mL | Freq: Once | INTRAVENOUS | Status: DC

## 2020-05-06 NOTE — Progress Notes (Addendum)
Temp check by: CW  Vital check by:CW  The patient states no changes in medical or surgical history since pre-visit screening on 04/22/20.  Interpreter used today at the [facility] for this pt.  Interpreter's name is-Ykeo Eban

## 2020-05-06 NOTE — Progress Notes (Signed)
Called to room to assist during endoscopic procedure.  Patient ID and intended procedure confirmed with present staff. Received instructions for my participation in the procedure from the performing physician.  

## 2020-05-06 NOTE — Op Note (Signed)
[place] Patient Name: Omar Sanders Procedure Date: 05/06/2020 10:39 AM MRN: [id number] Endoscopist: Docia Chuck. Henrene Pastor , MD Age: 79 Referring MD:  Date of Birth: [birth year] Gender: Male Account #: [id number] Procedure:                Colonoscopy with cold snare polypectomy x 1 Indications:              High risk colon cancer surveillance: Personal                            history of multiple (3 or more) adenomas. Previous                            examinations with Dr. Collene Mares (2002, 2008, 2012); here                            2018 Medicines:                Monitored Anesthesia Care Procedure:                Pre-Anesthesia Assessment:                           - Prior to the procedure, a History and Physical                            was performed, and patient medications and                            allergies were reviewed. The patient's tolerance of                            previous anesthesia was also reviewed. The risks                            and benefits of the procedure and the sedation                            options and risks were discussed with the patient.                            All questions were answered, and informed consent                            was obtained. Prior Anticoagulants: The patient has                            taken no previous anticoagulant or antiplatelet                            agents. ASA Grade Assessment: II - A patient with                            mild systemic disease. After reviewing the risks  and benefits, the patient was deemed in                            satisfactory condition to undergo the procedure.                           After obtaining informed consent, the colonoscope                            was passed under direct vision. Throughout the                            procedure, the patient's blood pressure, pulse, and                            oxygen saturations were  monitored continuously. The                            Colonoscope was introduced through the anus and                            advanced to the the cecum, identified by                            appendiceal orifice and ileocecal valve. The                            ileocecal valve, appendiceal orifice, and rectum                            were photographed. The quality of the bowel                            preparation was excellent. The colonoscopy was                            performed without difficulty. The patient tolerated                            the procedure well. The bowel preparation used was                            SUPREP via split dose instruction. Scope In: 10:46:25 AM Scope Out: 11:02:07 AM Scope Withdrawal Time: 0 hours 11 minutes 21 seconds  Total Procedure Duration: 0 hours 15 minutes 42 seconds  Findings:                 A 1 mm polyp was found in the ascending colon. The                            polyp was removed with a cold snare. Resection and                            retrieval were complete.  The exam was otherwise without abnormality on                            direct and retroflexion views. Complications:            No immediate complications. Estimated blood loss:                            None. Estimated Blood Loss:     Estimated blood loss: none. Impression:               - One 1 mm polyp in the ascending colon, removed                            with a cold snare. Resected and retrieved.                           - The examination was otherwise normal on direct                            and retroflexion views. Recommendation:           - Repeat colonoscopy is not recommended for                            surveillance.                           - Patient has a contact number available for                            emergencies. The signs and symptoms of potential                            delayed complications  were discussed with the                            patient. Return to normal activities tomorrow.                            Written discharge instructions were provided to the                            patient.                           - Resume previous diet.                           - Continue present medications.                           - Await pathology results. Docia Chuck. Henrene Pastor, MD 05/06/2020 11:09:01 AM This report has been signed electronically.

## 2020-05-06 NOTE — Patient Instructions (Signed)
HM NAY QU V? ? TH?C HI?N TH? THU?T N?I SOI T?I TRUNG TM N?I SOI [place]: Vui lng xem bo co th? thu?t ? ???c g?i cho qu v?, n?u qu v? c b?t k? th?c m?c g trong su?t qu trnh th?m khm. N?u bo co th? thu?t khng th? gi?i ?p th?c m?c c?a qu v?, vui lng g?i cho bc s? chuyn khoa tiu ha c?a qu v? ?? ???c gi?i ?p. N?u qu v? ? yu c?u khng cung c?p thng tin chi ti?t v? k?t qu? th? thu?t cho ??i tc ch?m [place] c?a qu v?, th bo co th? thu?t s? ???c g?i trong m?t phong b ???c dn kn ?? qu v? xem khi thu?n ti?n.   QU V? C TH?: C?m gic ch??ng b?ng. Trung ti?n nhi?u h?n bnh th??ng. ?i b? c th? gip ??y ra ngoi khng kh ?i vo ???ng tiu ha trong khi th?c hi?n th? thu?t v gi?m ch??ng b?ng. N?u qu v? ti?n hnh n?i soi d??i (nh? n?i soi ??i trng ho?c soi k?t trng xch-ma b?ng ?ng m?m), qu v? c th? th?y cc ch?m mu ? phn ho?c trn gi?y v? sinh. N?u qu v? ? lm s?ch ??i trng ?? th?c hi?n th? thu?t, qu v? c th? khng ?i ??i ti?n nh? bnh th??ng trong vi ngy.  Vui Lng L?u : Qu v? c th? b? kch ?ng v ngh?t m?i ho?c ch?y n??c m?i. Tnh tr?ng ny l do ?nh h??ng c?a vi?c th? bnh oxy trong qu trnh th?c hi?n th? thu?t. Qu v? khng c?n lo l?ng, tnh tr?ng ny s? bi?n m?t sau m?t ho?c vi ngy.   CC TRI?U CH?NG C?N BO CO NGAY  Sau khi th?c hi?n n?i soi d??i (n?i soi ??i trng ho?c soi k?t trng xch-ma b?ng ?ng m?m): Phn c nhi?u mu ?au b?ng d? d?i ho?c ngy cng t?ng Xu?t hi?n v?t s?ng b?ng m?i, c?p tnh S?t t? 100F tr? ln   Sau khi th?c hi?n n?i soi trn (EGD)  Nn ra mu ho?c ch?t mu c ph s?m Xu?t hi?n c?n ?au ng?c ho?c ?au d??i x??ng b? vai m?i Nu?t ?au ho?c kh nu?t M?i b? kh th? S?t t? 100F tr? ln Phn ?en nh? m?c  ??i v?i cc v?n ?? kh?n c?p ho?c c?p c?u, qu v? c th? lin h? bc s? chuyn khoa tiu ha b?t k? lc no b?ng cch g?i ??n s? [phone].   CH? ?? ?N U?NG: Chng ti [employer] v? tr??c tin nn ?n nh?, nh?ng sau ? qu v? c th? ?n  theo ch? ?? bnh th??ng. U?ng nhi?u n??c nh?ng ph?i trnh ?? u?ng c c?n trong 24 gi?.  HO?T ??NG: Qu v? c?n ln k? ho?ch ?? ngh? ng?i trong ngy hm nay v [facility] NN LI XE ho?c s? d?ng my mc n?ng cho ??n ngy mai (do tc d?ng c?a thu?c an th?n s? d?ng trong th? thu?t).   THEO DI: Nhn vin c?a chng ti s? g?i cho qu v? theo s? ?i?n tho?i trong b?nh n vo ngy lm vi?c ti?p theo sau ngy th?c hi?n th? thu?t ?? ki?m tra tnh tr?ng c?a qu v? v gi?i ?p cc cu h?i ho?c th?c m?c c?a qu v? v? thng tin m qu v? ???c cung c?p sau khi th?c hi?n th? thu?t. N?u chng ti khng lin l?c ???c v?i qu v?, chng ti s? ?? l?i tin nh?n. Tuy nhin, n?u qu v? c?m th?y kh?e  v khng g?p b?t k? s? c? no, qu v? khng c?n g?i l?i cho chng ti. Chng ti s? gi? ??nh r?ng qu v? ? tr? l?i sinh ho?t bnh th??ng v khng g?p b?t k? s? c? no.  N?u qu v? ???c l?y sinh thi?t, chng ti s? lin l?c v?i qu v? qua ?i?n tho?i ho?c th? trong 1-3 tu?n ti?p theo. Vui lng g?i cho chng ti theo s? [phone] n?u qu v? khng nh?n ???c thng tin v? k?t qu? sinh thi?t trong 3 tu?n.  CH? K/B?O M?T: Qu v? v/ho?c ??i tc ch?m sc c?a qu v? ? k vo cc ti li?u s? ???c nh?p vo h? s? y t? ?i?n t? c?a qu v?. Ch? k ny xc nh?n r?ng cc thng tin trn ?y trong B?n Tm T?t Sau Khi Th?m Khm c?a qu v? ? ???c xem xt v hi?u r. Qu v? v/ho?c   

## 2020-05-06 NOTE — Progress Notes (Signed)
A/ox3, pleased with MAC, report to RN 

## 2020-05-07 ENCOUNTER — Telehealth: Payer: Self-pay | Admitting: Internal Medicine

## 2020-05-07 NOTE — Telephone Encounter (Signed)
Pt called back he had a colonoscopy yesterday with Dr. Henrene Pastor. Asked if it was ok to take his Pepcid and Miralax. Per Dr. Henrene Pastor he was able to continue his previous medications.

## 2020-05-07 NOTE — Telephone Encounter (Signed)
Patient calling to ask if he can start on some of his medications pease advise.

## 2020-05-10 ENCOUNTER — Telehealth: Payer: Self-pay

## 2020-05-10 NOTE — Telephone Encounter (Signed)
1st follow up call made.  NALM 

## 2020-05-10 NOTE — Telephone Encounter (Signed)
2nd follow up call made.  NALM 

## 2020-05-11 ENCOUNTER — Encounter: Payer: Self-pay | Admitting: Family Medicine

## 2020-05-11 ENCOUNTER — Other Ambulatory Visit: Payer: Self-pay

## 2020-05-11 ENCOUNTER — Ambulatory Visit (INDEPENDENT_AMBULATORY_CARE_PROVIDER_SITE_OTHER): Payer: Medicare Other | Admitting: Family Medicine

## 2020-05-11 DIAGNOSIS — K219 Gastro-esophageal reflux disease without esophagitis: Secondary | ICD-10-CM | POA: Diagnosis not present

## 2020-05-11 MED ORDER — PANTOPRAZOLE SODIUM 40 MG PO TBEC
40.0000 mg | DELAYED_RELEASE_TABLET | Freq: Two times a day (BID) | ORAL | 0 refills | Status: DC
Start: 2020-05-11 — End: 2020-07-14

## 2020-05-11 MED ORDER — TUMS E-X 750 750 MG PO CHEW: 1.0000 | CHEWABLE_TABLET | Freq: Two times a day (BID) | ORAL | 0 refills | Status: AC

## 2020-05-11 NOTE — Progress Notes (Signed)
    SUBJECTIVE:    CHIEF COMPLAINT / HPI:   Omar Sanders is a 79 yr old male who presents today for abdominal pain.  Virtual [place] speaking interpreter was used for this encounter.  Abdominal pain Patient endorses ongoing epigastric and central abdominal pain for many years.  The pain is described as" very uncomfortable and bloating" and " burning" and it is radiating into his esophagus.  Seen previously by Dr. Tammi Klippel and was prescribed Nexium and Pepcid.  It was unclear whether patient actually was took these at the clinic visit.  No exacerbating factors or relieving factors for the pain.  No hematemesis, rectal bleeding or fevers.  Has had a 10 pound weight loss in the last few months but has not changed his diet. Suffers from longstanding constipation.  Would like me to change his medications today to help with his heartburn.   PERTINENT  PMH / PSH: Hypertension, hiatus hernia, abdominal aortic aneurysm  OBJECTIVE:   BP (!) 98/56   Pulse (!) 48   Ht 5\' 7"  (1.702 m)   Wt 171 lb 3.2 oz (77.7 kg)   SpO2 97%   BMI 26.81 kg/m   General: Alert, pleasant, no acute distress Cardio: Normal S1 and S2, RRR Pulm: CTAB. Normal respiratory effort Abdomen: Nondistended, epigastric tenderness, no guarding, bowel sounds present Extremities: No peripheral edema. Warm/ well perfused.  Strong radial pulse Neuro: Cranial nerves grossly intact  ASSESSMENT/PLAN:   GERD Patient symptoms are consistent with longstanding GERD.  Last EGD was in 2018 which showed LA grade a esophagitis. Given longstanding history of patient's symptoms, age and new weight loss considered GI malignancy as top differential I cannot exclude this without EGD.  Explained the importance of EGD however patient refused due to previous bad experience with EGD is very scared of having the procedure done.  I explained that he would be given sedation if he wanted however patient still declined the procedure.  I therefore recommended  he stop Nexium and Pepcid and start taking Protonix 40 mg twice daily with Tums. -Recommended follow-up with PCP in the next 2 weeks if symptoms have not improved -Would recommend urease breath test at follow-up -Would continue to encourage patient to have EGD      Lattie Haw, MD [place]

## 2020-05-11 NOTE — Patient Instructions (Addendum)
Please take Protinix 40mg  twice daily and take TUMS twice a day. Please contact me if you change your mind about an endoscopy  Best wishes, Dr Posey Pronto

## 2020-05-11 NOTE — Assessment & Plan Note (Addendum)
Patient symptoms are consistent with longstanding GERD.  Last EGD was in 2018 which showed LA grade a esophagitis. Given longstanding history of patient's symptoms, age and new weight loss considered GI malignancy as top differential I cannot exclude this without EGD.  Explained the importance of EGD however patient refused due to previous bad experience with EGD is very scared of having the procedure done.  I explained that Omar Sanders would be given sedation if Omar Sanders wanted however patient still declined the procedure.  I therefore recommended Omar Sanders stop Nexium and Pepcid and start taking Protonix 40 mg twice daily with Tums. -Recommended follow-up with PCP in the next 2 weeks if symptoms have not improved -Would recommend urease breath test at follow-up -Would continue to encourage patient to have EGD

## 2020-05-14 ENCOUNTER — Telehealth: Payer: Self-pay

## 2020-05-14 ENCOUNTER — Encounter: Payer: Self-pay | Admitting: Internal Medicine

## 2020-05-14 ENCOUNTER — Other Ambulatory Visit: Payer: Self-pay | Admitting: Family Medicine

## 2020-05-14 DIAGNOSIS — K219 Gastro-esophageal reflux disease without esophagitis: Secondary | ICD-10-CM

## 2020-05-14 NOTE — Telephone Encounter (Signed)
Patient calls nurse line regarding medication management. Patient was seen in clinic on 05/11/2020 for GERD and abdominal pain by Dr. Posey Pronto. Patient was started on protonix and tums. Patient states that he has had no relief and continues to have GI upset, bloating and reflux. Patient states that he has issues with transportation so following up in office is difficult for him. Forwarding to PCP and Dr. Posey Pronto for next steps.   Talbot Grumbling, RN

## 2020-05-14 NOTE — Telephone Encounter (Signed)
Please let patient know that I will refer him to GI where they will speak to him about his symptoms and consider EGD. Alternatively he can come into the clinic for a follow up visit but I know this is difficult for him.

## 2020-05-14 NOTE — Telephone Encounter (Signed)
Thank you for placing the referral. I agree with Dr. Serita Grit plan.

## 2020-06-02 ENCOUNTER — Other Ambulatory Visit: Payer: Self-pay | Admitting: *Deleted

## 2020-06-03 MED ORDER — LISINOPRIL 5 MG PO TABS: 5.0000 mg | ORAL_TABLET | Freq: Every day | ORAL | 3 refills | Status: DC

## 2020-06-03 NOTE — Telephone Encounter (Signed)
Patient calling nurse line requesting status of rx refill. Informed of 24-48 hour rx refill policy. Patient verbalized understanding.   Will send to PCP  Talbot Grumbling, RN

## 2020-06-15 DIAGNOSIS — N189 Chronic kidney disease, unspecified: Secondary | ICD-10-CM | POA: Diagnosis not present

## 2020-06-15 DIAGNOSIS — I129 Hypertensive chronic kidney disease with stage 1 through stage 4 chronic kidney disease, or unspecified chronic kidney disease: Secondary | ICD-10-CM | POA: Diagnosis not present

## 2020-06-15 DIAGNOSIS — N1831 Chronic kidney disease, stage 3a: Secondary | ICD-10-CM | POA: Diagnosis not present

## 2020-06-15 DIAGNOSIS — D631 Anemia in chronic kidney disease: Secondary | ICD-10-CM | POA: Diagnosis not present

## 2020-06-15 DIAGNOSIS — E559 Vitamin D deficiency, unspecified: Secondary | ICD-10-CM | POA: Diagnosis not present

## 2020-07-14 ENCOUNTER — Encounter: Payer: Self-pay | Admitting: Family Medicine

## 2020-07-14 ENCOUNTER — Other Ambulatory Visit: Payer: Self-pay

## 2020-07-14 ENCOUNTER — Ambulatory Visit (INDEPENDENT_AMBULATORY_CARE_PROVIDER_SITE_OTHER): Payer: Medicare Other | Admitting: Family Medicine

## 2020-07-14 ENCOUNTER — Ambulatory Visit: Payer: Medicare Other | Admitting: Family Medicine

## 2020-07-14 VITALS — BP 128/80 | HR 69 | Wt 179.6 lb

## 2020-07-14 DIAGNOSIS — K219 Gastro-esophageal reflux disease without esophagitis: Secondary | ICD-10-CM | POA: Diagnosis not present

## 2020-07-14 DIAGNOSIS — E785 Hyperlipidemia, unspecified: Secondary | ICD-10-CM

## 2020-07-14 DIAGNOSIS — Z758 Other problems related to medical facilities and other health care: Secondary | ICD-10-CM

## 2020-07-14 DIAGNOSIS — R140 Abdominal distension (gaseous): Secondary | ICD-10-CM | POA: Diagnosis not present

## 2020-07-14 DIAGNOSIS — E1122 Type 2 diabetes mellitus with diabetic chronic kidney disease: Secondary | ICD-10-CM | POA: Diagnosis not present

## 2020-07-14 DIAGNOSIS — Z789 Other specified health status: Secondary | ICD-10-CM | POA: Diagnosis not present

## 2020-07-14 LAB — POCT GLYCOSYLATED HEMOGLOBIN (HGB A1C): HbA1c, POC (controlled diabetic range): 6.4 % (ref 0.0–7.0)

## 2020-07-14 MED ORDER — OMEPRAZOLE 40 MG PO CPDR: 40.0000 mg | DELAYED_RELEASE_CAPSULE | Freq: Every day | ORAL | 3 refills | Status: DC

## 2020-07-14 NOTE — Progress Notes (Signed)
    SUBJECTIVE:   CHIEF COMPLAINT / HPI:   Omar Sanders is a 79 yo M who presents for the below issues.   Abdominal Bloating Longtime problem. Left lower abdomen. Explaining pain in bowels. Has blood in stool with constipation but he does not remember the last time he saw blood in stool. Denies feeling dizzy, short of breath, and palpitations. Takes laxative for bowel movements. Not taking pepcid only took for 5 or 6 days because he felt it has not worked.   Diabetes Current Regimen: No medications CBGs: does not check at home Last A1c: 6.5 on 04/15/20  Denies polyuria, polydipsia, hypoglycemia Last Eye Exam: Unsure Statin: Atorvastatin 40 mg  ACE/ARB: Lisinopril 5 mg   PERTINENT  PMH / PSH: IBS, Constipation, GERD, Hx of colonic polyps, HLD, GAD  OBJECTIVE:   BP 128/80   Pulse 69   Wt 179 lb 9.6 oz (81.5 kg)   SpO2 96%   BMI 28.13 kg/m   General: Appears well, no acute distress. Age appropriate. Cardiac: RRR, normal heart sounds, no murmurs Respiratory: CTAB, normal effort Abdomen: soft, nontender, non-distended. No guarding or masses. Active bowel sounds.   ASSESSMENT/PLAN:   Abdominal bloating Chronic abdominal pain and bloating. Occasional blood in stool with bowel movements but patient unsure when he last saw blood in his stool. Recent colonoscopy with benign  polyp removed. No evidence of diverticulitis/-osis seen. Normal abdominal exam today in office. Could be related to combination of IBS and GERD. Consider bleeding form hemorrhoids due to constipation (uable to perform anoscopy;consider at follow up). Also need to consider malignancy according to Dr. Serita Grit evaluation in July. GI called patient for follow up several time, should strongly consider an EGD. Will return patient to prilosec again for relief with acid reflux. Will have him call [facility] for follow up. Repeated this plan several time through an interpreter due to language and health literacy  barriers. -Extensively stressed the need for GI follow up with further work up of EGD; office number given to patient to call and make an appointment as soon as possible -Start omeprazole -Continue laxative for constipation -Follow up if symptoms worsen    Diabetes ([place]) Well controlled. A1c 6.4 -Obtain lipid panel -Follow up in 6 months; foot exam and repeat A1c   Gerlene Fee, [place]   *[employer] video interpreter used during entire encounter

## 2020-07-14 NOTE — Patient Instructions (Addendum)
It was wonderful to see you today.  Please bring ALL of your medications with you to every visit.   Today we talked about:  Chronic abdominal pain and bloating. Please try protonix again for relief with acid reflux. Please call [phone] which is the stomach doctor for follow up.    Your hemoglobin A1c was 6.4 today. This is good. I am giving a lipid panel if abnormal I will call. If normal I will send a letter.   Please be sure to schedule follow up at the front  desk before you leave today.   Please call the clinic at [phone] if your symptoms worsen or you have any concerns. It was our pleasure to serve you.  Dr. Janus Molder

## 2020-07-15 LAB — LIPID PANEL
Chol/HDL Ratio: 4.9 ratio (ref 0.0–5.0)
Cholesterol, Total: 117 mg/dL (ref 100–199)
HDL: 24 mg/dL — ABNORMAL LOW (ref >39)
LDL Chol Calc (NIH): 63 mg/dL (ref 0–99)
Triglycerides: 179 mg/dL — ABNORMAL HIGH (ref 0–149)
VLDL Cholesterol Cal: 30 mg/dL (ref 5–40)

## 2020-07-19 ENCOUNTER — Other Ambulatory Visit: Payer: Self-pay

## 2020-07-19 MED ORDER — CETIRIZINE HCL 10 MG PO TABS
10.0000 mg | ORAL_TABLET | Freq: Every day | ORAL | 0 refills | Status: DC
Start: 2020-07-19 — End: 2020-08-17

## 2020-07-20 ENCOUNTER — Encounter: Payer: Self-pay | Admitting: Family Medicine

## 2020-07-20 DIAGNOSIS — R140 Abdominal distension (gaseous): Secondary | ICD-10-CM | POA: Insufficient documentation

## 2020-07-20 HISTORY — DX: Abdominal distension (gaseous): R14.0

## 2020-07-20 NOTE — Assessment & Plan Note (Addendum)
Well controlled. A1c 6.4 -Obtain lipid panel -Follow up in 6 months; foot exam and repeat A1c

## 2020-07-20 NOTE — Assessment & Plan Note (Addendum)
Chronic abdominal pain and bloating. Occasional blood in stool with bowel movements but patient unsure when he last saw blood in his stool. Recent colonoscopy with benign  polyp removed. No evidence of diverticulitis/-osis seen. Normal abdominal exam today in office. Could be related to combination of IBS and GERD. Consider bleeding form hemorrhoids due to constipation (uable to perform anoscopy;consider at follow up). Also need to consider malignancy according to Dr. Serita Grit evaluation in July. GI called patient for follow up several time, should strongly consider an EGD. Will return patient to prilosec again for relief with acid reflux. Will have him call [facility] for follow up. Repeated this plan several time through an interpreter due to language and health literacy barriers. -Extensively stressed the need for GI follow up with further work up of EGD; office number given to patient to call and make an appointment as soon as possible -Start omeprazole -Continue laxative for constipation -Follow up if symptoms worsen

## 2020-08-17 ENCOUNTER — Other Ambulatory Visit: Payer: Self-pay

## 2020-08-18 MED ORDER — CETIRIZINE HCL 10 MG PO TABS
10.0000 mg | ORAL_TABLET | Freq: Every day | ORAL | 0 refills | Status: DC
Start: 2020-08-18 — End: 2020-09-21

## 2020-08-22 NOTE — Progress Notes (Signed)
Cardiology Office Note:   Date:  08/23/2020  NAME:  Omar Sanders    MRN: [id number] DOB:  [birth year]   PCP:  Gerlene Fee, DO  Cardiologist:  Evalina Field, MD   Referring MD: Gerlene Fee, DO   Chief Complaint  Patient presents with  . Follow-up   History of Present Illness:   Omar Sanders is a 79 y.o. male with a hx of RBBB, 1AVB, HTN, HLD who presents for follow-up. Seen for constant dizziness without cardiac explanation. Follow-up today.  He reports he is having intermittent episodes of chest pain.  Apparently this is a chronic issue.  Has had this for years.  He describes some sharp pain in his left chest.  Can occur with exertion or at rest.  Does radiate into the left shoulder as well as into the left neck.  He is able to walk about 1 mile without any major limitations.  He can get the pain when that occurs.  The pain does improve with a heating pad as well.  He does have some increased lower extremity edema.  He is now on torsemide.  He also has some JVD.  He did not take his torsemide today.  I recommend he do that.  He did have evidence of diastolic dysfunction on echocardiogram from a few years ago.  We do need an update.  Overall he seems to be doing well.  His first-degree block does not appear to be an issue.  He describes no further dizziness episodes.  Main issues are shortness of breath and chest pain.  Problem List 1. 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 HTN 4. 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 HLD -T chol 130, HDL 33, LDL 68, TG 147  Past Medical History: Past Medical History:  Diagnosis Date  . Allergy   . Anal fissure   . Anemia   . Anxiety   . Arthritis    all over body   . Benign prostatic hyperplasia   . Bilateral lower extremity edema 03/22/2018  . Cataract    removed bilat   . Chest pain 02/2018  . Chronic kidney disease   . Chronic pain   . Colon polyp   . GERD (gastroesophageal reflux disease)   . Headache(784.0)     . Hemorrhoids   . Hyperlipidemia    Hx: of  . Hypertension   . IBS (irritable bowel syndrome)   . Incontinent of urine    Hx:of  . Migraines   . Neuromuscular disorder ([place])    periferal neuropathy per patient  . New onset right bundle branch block (RBBB)   . OSA (obstructive sleep apnea) 11/2009   sleep study with mild OSA, pt unwilling to use CPAP  . Seizures ([place])    pt states he has never had a seizure   . Sleep apnea    mild     Past Surgical History: Past Surgical History:  Procedure Laterality Date  . 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 CATARACT EXTRACTION     bilat  . CHOLECYSTECTOMY  2008  . COLONOSCOPY  2012   Dr Collene Mares  . HEMORRHOID SURGERY     anal surgery  . INGUINAL HERNIA REPAIR Bilateral 03/11/2013   Procedure: LAPAROSCOPIC BILATERAL INGUINAL HERNIA REPAIR;  Surgeon: Ralene Ok, MD;  Location: [facility];  Service: General;  Laterality: Bilateral;  . INSERTION OF MESH  Bilateral 03/11/2013   Procedure: INSERTION OF MESH;  Surgeon: Ralene Ok, MD;  Location: [facility];  Service: General;  Laterality: Bilateral;  . NM MYOCAR PERF WALL MOTION  05/27/2009   mild iscemia mid inferio & apical regions.  . NM MYOCAR PERF WALL MOTION  11/01/2012   negative for ischemia  . POLYPECTOMY    . US ECHOCARDIOGRAPHY  05/27/2009   mild mitral annular ca+,AOV mildly sclerotic    Current Medications: Current Meds  Medication Sig  . acetaminophen (TYLENOL) 325 MG tablet Take 2 tablets (650 mg total) by mouth every 6 (six) hours as needed for mild pain (or Fever >/= 101).  Marland Kitchen atorvastatin (LIPITOR) 40 MG tablet Take 1 tablet (40 mg total) by mouth daily.  . carbamide peroxide (DEBROX) 6.5 % OTIC solution 5 drops 2 (two) times daily.  . cetirizine (ZYRTEC) 10 MG tablet Take 1 tablet (10 mg total) by mouth daily.  . cycloSPORINE (RESTASIS) 0.05 % ophthalmic emulsion Place 1 drop into both eyes 2 (two) times daily.   .  famotidine (PEPCID) 20 MG tablet Take 20 mg by mouth 2 (two) times daily.  . fluticasone (FLONASE) 50 MCG/ACT nasal spray Place 2 sprays into both nostrils daily.  Marland Kitchen lisinopril (ZESTRIL) 5 MG tablet Take 1 tablet (5 mg total) by mouth at bedtime.  . Multiple Vitamin (MULTIVITAMIN) tablet Take 1 tablet by mouth daily.   . Olopatadine HCl 0.2 % SOLN Apply 1 drop to eye daily. (Patient taking differently: Place 1 drop into both eyes 2 (two) times daily. )  . omeprazole (PRILOSEC) 40 MG capsule Take 1 capsule (40 mg total) by mouth daily.  . tamsulosin (FLOMAX) 0.4 MG CAPS capsule Take 2 capsules (0.8 mg total) by mouth daily after breakfast.  . torsemide (DEMADEX) 20 MG tablet Take 20 mg by mouth daily.  . [DISCONTINUED] Carboxymethylcellul-Glycerin (REFRESH RELIEVA OP) Apply to eye.  . [DISCONTINUED] finasteride (PROSCAR) 5 MG tablet Take 1 tablet (5 mg total) by mouth daily.  . [DISCONTINUED] furosemide (LASIX) 40 MG tablet Take 40 mg by mouth daily.   . [DISCONTINUED] OVER THE COUNTER MEDICATION Gentle laxative tablets 2 tabs as needed up to 3 x a day  . [DISCONTINUED] polyethylene glycol powder (GLYCOLAX/MIRALAX) 17 GM/SCOOP powder Take 17 g by mouth daily.  . [DISCONTINUED] triamcinolone ointment (KENALOG) 0.1 % Apply 1 application topically 2 (two) times daily.     Allergies:    Pantoprazole, Aspirin, Lubiprostone, and Vitamin c   Social History: Social History   Socioeconomic History  . Marital status: Widowed    Spouse name: Not on file  . Number of children: 5  . Years of education: Not on file  . Highest education level: Not on file  Occupational History  . Not on file  Tobacco Use  . Smoking status: Never Smoker  . Smokeless tobacco: Never Used  Vaping Use  . Vaping Use: Never used  Substance and Sexual Activity  . Alcohol use: No  . Drug use: No  . Sexual activity: Not Currently  Other Topics Concern  . Not on file  Social History Narrative  . Not on file   Social  Determinants of Health   Financial Resource Strain:   . Difficulty of Paying Living Expenses: Not on file  Food Insecurity:   . Worried About Charity fundraiser in the Last Year: Not on file  . Ran Out of Food in the Last Year: Not on file  Transportation Needs:   .  Lack of Transportation (Medical): Not on file  . Lack of Transportation (Non-Medical): Not on file  Physical Activity:   . Days of Exercise per Week: Not on file  . Minutes of Exercise per Session: Not on file  Stress:   . Feeling of Stress : Not on file  Social Connections:   . Frequency of Communication with Friends and Family: Not on file  . Frequency of Social Gatherings with Friends and Family: Not on file  . Attends Religious Services: Not on file  . Active Member of Clubs or Organizations: Not on file  . Attends Archivist Meetings: Not on file  . Marital Status: Not on file     Family History: The patient's family history includes Hypertension in his mother. There is no history of Colon cancer, Colon polyps, Esophageal cancer, Rectal cancer, or Stomach cancer.  ROS:   All other ROS reviewed and negative. Pertinent positives noted in the HPI.     EKGs/Labs/Other Studies Reviewed:   The following studies were personally reviewed by me today:  EKG: An EKG was ordered today.  This EKG demonstrates sinus bradycardia heart rate 54, first-degree AV block up to 324 ms, right bundle branch block, and was personally reviewed by me  TTE 03/22/2018 - Left ventricle: The cavity size was normal. Wall thickness was  increased in a pattern of mild LVH. Systolic function was normal.  The estimated ejection fraction was in the range of 55% to 60%.  Wall motion was normal; there were no regional wall motion  abnormalities. Doppler parameters are consistent with abnormal  left ventricular relaxation (grade 1 diastolic dysfunction). The  E/e&' ratio is between 8-15, suggesting indeterminate LV filling    pressure.  - Aorta: Aortic root dimension: 43 mm (ED). Ascending aortic  diameter: 40 mm (S).  - Ascending aorta: The ascending aorta was mildly dilated.  - Mitral valve: Mildly thickened leaflets . There was trivial  regurgitation.  - Left atrium: The atrium was normal in size.  - Right atrium: The atrium was mildly dilated.  - Tricuspid valve: There was trivial regurgitation.  - Pulmonary arteries: PA peak pressure: 23 mm Hg (S).  - Inferior vena cava: The vessel was normal in size. The  respirophasic diameter changes were in the normal range (= 50%),  consistent with normal central venous pressure.  Recent Labs: 11/22/2019: ALT 25 01/20/2020: BUN 16; Creatinine, Ser 1.46; Hemoglobin 14.1; Platelets 202; Potassium 4.2; Sodium 139   Recent Lipid Panel    Component Value Date/Time   CHOL 117 07/14/2020 1540   TRIG 179 (H) 07/14/2020 1540   HDL 24 (L) 07/14/2020 1540   CHOLHDL 4.9 07/14/2020 1540   CHOLHDL 7.0 (H) 07/12/2015 0938   VLDL 57 (H) 07/12/2015 0938   LDLCALC 63 07/14/2020 1540    Physical Exam:   VS:  BP 122/60   Pulse (!) 54   Temp (!) 97 F (36.1 C)   Ht 5\' 7"  (1.702 m)   Wt 178 lb 12.8 oz (81.1 kg)   SpO2 95%   BMI 28.00 kg/m    Wt Readings from Last 3 Encounters:  08/23/20 178 lb 12.8 oz (81.1 kg)  07/14/20 179 lb 9.6 oz (81.5 kg)  05/11/20 171 lb 3.2 oz (77.7 kg)    General: Well nourished, well developed, in no acute distress Heart: Atraumatic, normal size  Eyes: PEERLA, EOMI  Neck: Supple, +JVD Endocrine: No thryomegaly Cardiac: Normal S1, S2; RRR; no murmurs, rubs, or gallops Lungs:  Clear to auscultation bilaterally, no wheezing, rhonchi or rales  Abd: Soft, nontender, no hepatomegaly  Ext: 1+ pitting edema Musculoskeletal: No deformities, BUE and BLE strength normal and equal Skin: Warm and dry, no rashes   Neuro: Alert and oriented to person, place, time, and situation, CNII-XII grossly intact, no focal deficits  Psych: Normal  mood and affect   ASSESSMENT:   Omar Sanders is a 79 y.o. male who presents for the following: 1. Chest pain of uncertain etiology   2. SOB (shortness of breath)   3. RBBB   4. First degree AV block   5. Aortic dilatation (HCC)   6. Essential hypertension   7. Precordial pain   8. Abnormal electrocardiogram     PLAN:   1. Chest pain of uncertain etiology -Atypical chest pain.  He does have some typical features.  He had a negative stress test in 2017.  He does have evidence of congestive heart failure examination.  I would like to repeat a nuclear medicine stress test just with fluid obstructive CAD.  He also needs a repeat echo and BNP as below.  2. SOB (shortness of breath) -He has lower extremity edema and JVD.  He is on torsemide.  We do need to repeat an echocardiogram.  He did have some diastolic dysfunction noted in 2019.  I think he needs a repeat given his new symptoms.  He will continue his torsemide for now.  We also will proceed with nuclear medicine stress test given concern for possible diastolic heart failure.  3. RBBB 4. First degree AV block -No major symptoms from this.  Avoid AV nodal agents.  No strong indication for these.  5. Aortic dilatation (HCC) -Aorta measured 4 cm in 2019.  We will get a good look on echo this time if they match we will not repeat cross-sectional imaging.  6. Essential hypertension -Well-controlled.  Disposition: Return in about 3 months (around 11/23/2020).  Medication Adjustments/Labs and Tests Ordered: Current medicines are reviewed at length with the patient today.  Concerns regarding medicines are outlined above.  Orders Placed This Encounter  Procedures  . Basic metabolic panel  . Brain natriuretic peptide  . MYOCARDIAL PERFUSION IMAGING  . EKG 12-Lead  . ECHOCARDIOGRAM COMPLETE   No orders of the defined types were placed in this encounter.   Patient Instructions  Medication Instructions:  No change *If you need a  refill on your cardiac medications before your next appointment, please call your pharmacy*   Lab Work: BNP BMP If you have labs (blood work) drawn today and your tests are completely normal, you will receive your results only by: Marland Kitchen MyChart Message (if you have MyChart) OR . A paper copy in the mail If you have any lab test that is abnormal or we need to change your treatment, we will call you to review the results.   Testing/Procedures:  BOTH WILL BE SCHEDULE AT [facility] Your physician has requested that you have an echocardiogram. Echocardiography is a painless test that uses sound waves to create images of your heart. It provides your doctor with information about the size and shape of your heart and how well your heart's chambers and valves are working. This procedure takes approximately one hour. There are no restrictions for this procedure. AND Your physician has requested that you have a lexiscan myoview.  Please follow instruction sheet, as given.    Follow-Up: At [facility], you and your health needs  are our priority.  As part of our continuing mission to provide you with exceptional heart care, we have created designated Provider Care Teams.  These Care Teams include your primary Cardiologist (physician) and Advanced Practice Providers (APPs -  Physician Assistants and Nurse Practitioners) who all work together to provide you with the care you need, when you need it.  We recommend signing up for the patient portal called "MyChart".  Sign up information is provided on this After Visit Summary.  MyChart is used to connect with patients for Virtual Visits (Telemedicine).  Patients are able to view lab/test results, encounter notes, upcoming appointments, etc.  Non-urgent messages can be sent to your provider as well.   To learn more about what you can do with MyChart, go to [email or web address].    Your next appointment:   3 month(s)  The format  for your next appointment:   In Person  Provider:   Eleonore Chiquito, MD   Other Instructions     Time Spent with Patient: I have spent a total of 35 minutes with patient reviewing hospital notes, telemetry, EKGs, labs and examining the patient as well as establishing an assessment and plan that was discussed with the patient.  > 50% of time was spent in direct patient care.  Signed, Addison Naegeli. Audie Box, [place]  [address], [place], [place] [address] [phone]  08/23/2020 11:59 AM

## 2020-08-23 ENCOUNTER — Ambulatory Visit: Payer: Medicare Other | Admitting: Cardiovascular Disease

## 2020-08-23 ENCOUNTER — Other Ambulatory Visit: Payer: Self-pay

## 2020-08-23 ENCOUNTER — Ambulatory Visit (INDEPENDENT_AMBULATORY_CARE_PROVIDER_SITE_OTHER): Payer: Medicare Other | Admitting: Cardiovascular Disease

## 2020-08-23 ENCOUNTER — Encounter: Payer: Self-pay | Admitting: Cardiovascular Disease

## 2020-08-23 VITALS — BP 122/60 | HR 54 | Temp 97.0°F | Ht 67.0 in | Wt 178.8 lb

## 2020-08-23 DIAGNOSIS — R0602 Shortness of breath: Secondary | ICD-10-CM | POA: Diagnosis not present

## 2020-08-23 DIAGNOSIS — R072 Precordial pain: Secondary | ICD-10-CM | POA: Diagnosis not present

## 2020-08-23 DIAGNOSIS — R9431 Abnormal electrocardiogram [ECG] [EKG]: Secondary | ICD-10-CM

## 2020-08-23 DIAGNOSIS — R079 Chest pain, unspecified: Secondary | ICD-10-CM | POA: Diagnosis not present

## 2020-08-23 DIAGNOSIS — I77819 Aortic ectasia, unspecified site: Secondary | ICD-10-CM

## 2020-08-23 DIAGNOSIS — I4510 Unspecified right bundle-branch block: Secondary | ICD-10-CM

## 2020-08-23 DIAGNOSIS — R42 Dizziness and giddiness: Secondary | ICD-10-CM

## 2020-08-23 DIAGNOSIS — I10 Essential (primary) hypertension: Secondary | ICD-10-CM

## 2020-08-23 DIAGNOSIS — I440 Atrioventricular block, first degree: Secondary | ICD-10-CM | POA: Diagnosis not present

## 2020-08-23 LAB — SPECIMEN STATUS REPORT

## 2020-08-23 NOTE — Patient Instructions (Signed)
Medication Instructions:  No change *If you need a refill on your cardiac medications before your next appointment, please call your pharmacy*   Lab Work: BNP BMP If you have labs (blood work) drawn today and your tests are completely normal, you will receive your results only by: Marland Kitchen MyChart Message (if you have MyChart) OR . A paper copy in the mail If you have any lab test that is abnormal or we need to change your treatment, we will call you to review the results.   Testing/Procedures:  BOTH WILL BE SCHEDULE AT [place] [facility] Your physician has requested that you have an echocardiogram. Echocardiography is a painless test that uses sound waves to create images of your heart. It provides your doctor with information about the size and shape of your heart and how well your heart's chambers and valves are working. This procedure takes approximately one hour. There are no restrictions for this procedure. AND Your physician has requested that you have a lexiscan myoview.  Please follow instruction sheet, as given.    Follow-Up: At [facility], you and your health needs are our priority.  As part of our continuing mission to provide you with exceptional heart care, we have created designated Provider Care Teams.  These Care Teams include your primary Cardiologist (physician) and Advanced Practice Providers (APPs -  Physician Assistants and Nurse Practitioners) who all work together to provide you with the care you need, when you need it.  We recommend signing up for the patient portal called "MyChart".  Sign up information is provided on this After Visit Summary.  MyChart is used to connect with patients for Virtual Visits (Telemedicine).  Patients are able to view lab/test results, encounter notes, upcoming appointments, etc.  Non-urgent messages can be sent to your provider as well.   To learn more about what you can do with MyChart, go to [email or web address].     Your next appointment:   3 month(s)  The format for your next appointment:   In Person  Provider:   Eleonore Chiquito, MD   Other Instructions

## 2020-08-24 LAB — BASIC METABOLIC PANEL
BUN/Creatinine Ratio: 12 (ref 10–24)
BUN: 16 mg/dL (ref 8–27)
CO2: 24 mmol/L (ref 20–29)
Calcium: 8.8 mg/dL (ref 8.6–10.2)
Chloride: 104 mmol/L (ref 96–106)
Creatinine, Ser: 1.38 mg/dL — ABNORMAL HIGH (ref 0.76–1.27)
GFR calc Af Amer: 55 mL/min/{1.73_m2} — ABNORMAL LOW (ref >59)
GFR calc non Af Amer: 48 mL/min/{1.73_m2} — ABNORMAL LOW (ref >59)
Glucose: 94 mg/dL (ref 65–99)
Potassium: 3.9 mmol/L (ref 3.5–5.2)
Sodium: 141 mmol/L (ref 134–144)

## 2020-08-24 LAB — BRAIN NATRIURETIC PEPTIDE: BNP: 16.3 pg/mL (ref 0.0–100.0)

## 2020-08-31 ENCOUNTER — Telehealth (HOSPITAL_COMMUNITY): Payer: Self-pay

## 2020-08-31 NOTE — Telephone Encounter (Signed)
Patient given detailed instructions per Myocardial Perfusion Study Information Sheet for the test on 09/16/2020 at 10:45. Patient notified to arrive 15 minutes early and that it is imperative to arrive on time for appointment to keep from having the test rescheduled.  If you need to cancel or reschedule your appointment, please call the office within 24 hours of your appointment. . Patient verbalized understanding.EHK

## 2020-09-02 ENCOUNTER — Ambulatory Visit (INDEPENDENT_AMBULATORY_CARE_PROVIDER_SITE_OTHER): Payer: Medicare Other | Admitting: Physician Assistant

## 2020-09-02 ENCOUNTER — Encounter: Payer: Self-pay | Admitting: Physician Assistant

## 2020-09-02 VITALS — BP 90/60 | HR 72 | Ht 65.5 in | Wt 181.1 lb

## 2020-09-02 DIAGNOSIS — R140 Abdominal distension (gaseous): Secondary | ICD-10-CM | POA: Diagnosis not present

## 2020-09-02 DIAGNOSIS — K219 Gastro-esophageal reflux disease without esophagitis: Secondary | ICD-10-CM | POA: Diagnosis not present

## 2020-09-02 DIAGNOSIS — K5900 Constipation, unspecified: Secondary | ICD-10-CM

## 2020-09-02 MED ORDER — FAMOTIDINE 20 MG PO TABS
ORAL_TABLET | ORAL | 6 refills | Status: DC
Start: 2020-09-02 — End: 2020-12-30

## 2020-09-02 MED ORDER — LINACLOTIDE 145 MCG PO CAPS: 145.0000 ug | ORAL_CAPSULE | Freq: Every day | ORAL | 6 refills | Status: DC

## 2020-09-02 NOTE — Patient Instructions (Addendum)
?  u b?n t? 63 tu?i tr? ln, ch? s? kh?i c? th? c?a b?n nn t? 23-30. Ch? s? kh?i c? th? c?a b?n l 29,68 kg / m. N?u ?i?u ny n?m ngoi ph?m vi ???c li?t k ? trn, vui lng xem xt lin h? v?i Nh cung c?p d?ch v? ch?m [place] chnh c?a b?n.  N?u b?n t? 61 tu?i tr? xu?ng, ch? s? kh?i c? th? c?a b?n ph?i t? 19-25. Ch? s? kh?i c? th? c?a b?n l 29,68 kg / m. N?u ?i?u ny n?m ngoi ph?m vi ? nu ???c li?t k, vui lng xem xt lin h? v?i Nh cung c?p d?ch v? ch?m [place] chnh c?a b?n.  B?T ??U Linzess 145 mcg 1 vin m?i ngy vo bu?i sng  Ti?p t?c Famotidine 20 mg, 2 vin vo bu?i sng v 1 vin vo bu?i t?i  Theo di n?u c?n v?i Amy Esterwood, PA-C ho?c Ti?n s? Henrene Pastor.

## 2020-09-02 NOTE — Progress Notes (Signed)
Subjective:    Patient ID: Omar Sanders, male    DOB: 13-Dec-1940, 79 y.o.   MRN: [id number]  HPI Omar Sanders is an 79 year old non-English-speaking [place] male, established with Dr. Henrene Pastor who comes in today for follow-up of chronic issues with abdominal bloating, constipation and GERD. Patient is here alone today and interview conducted via the mobile interpreter. Patient has history of hypertension, GERD, IBS, adult onset diabetes mellitus, BPH and chronic kidney disease. He was last seen in July 2021 for colonoscopy due to history of adenomatous colon polyps.  He was found to have a 1 mm polyp which was removed and path consistent with tubular adenoma.  No follow-up recommended due to age. Last EGD 2018 with grade a esophagitis and otherwise negative exam. Patient relates that his symptoms have been present for years and that he is not experiencing anything new.  He continues to struggle with chronic bloating and constipation.  He apparently had been given a prescription for a stool softener in the past, he has tried [place] but says he does not want to use MiraLAX because he has read all of the side effects etc. and feels that it is contraindicated with his chronic kidney disease. He also had a prescription for omeprazole which she stopped after reading all of the side effects.  He brought a package insert with him and says that he developed some discomfort into his shoulders and neck which is listed on the insert and therefore does not want to take omeprazole.  He has heartburn and indigestion frequently both daytime and nighttime.  No dysphagia or odynophagia. No complaints of melena or hematochezia. Patient feels bloating is improved with bowel movements, he may go a few days between bowel movements currently.  Review of Systems Pertinent positive and negative review of systems were noted in the above HPI section.  All other review of systems was otherwise negative.  Outpatient Encounter  Medications as of 09/02/2020  Medication Sig  . acetaminophen (TYLENOL) 325 MG tablet Take 2 tablets (650 mg total) by mouth every 6 (six) hours as needed for mild pain (or Fever >/= 101).  Marland Kitchen atorvastatin (LIPITOR) 40 MG tablet Take 1 tablet (40 mg total) by mouth daily.  . carbamide peroxide (DEBROX) 6.5 % OTIC solution 5 drops 2 (two) times daily.  . cetirizine (ZYRTEC) 10 MG tablet Take 1 tablet (10 mg total) by mouth daily.  . cycloSPORINE (RESTASIS) 0.05 % ophthalmic emulsion Place 1 drop into both eyes 2 (two) times daily.   . famotidine (PEPCID) 20 MG tablet Take 2 tablets in the morning and 1 tablet in the evening  . fluticasone (FLONASE) 50 MCG/ACT nasal spray Place 2 sprays into both nostrils daily.  Marland Kitchen lisinopril (ZESTRIL) 5 MG tablet Take 1 tablet (5 mg total) by mouth at bedtime.  . Multiple Vitamin (MULTIVITAMIN) tablet Take 1 tablet by mouth daily.   . Olopatadine HCl 0.2 % SOLN Apply 1 drop to eye daily. (Patient taking differently: Place 1 drop into both eyes 2 (two) times daily. )  . tamsulosin (FLOMAX) 0.4 MG CAPS capsule Take 2 capsules (0.8 mg total) by mouth daily after breakfast.  . torsemide (DEMADEX) 20 MG tablet Take 20 mg by mouth daily.  . [DISCONTINUED] famotidine (PEPCID) 20 MG tablet Take 20 mg by mouth 2 (two) times daily.  Marland Kitchen linaclotide (LINZESS) 145 MCG CAPS capsule Take 1 capsule (145 mcg total) by mouth daily before breakfast.  . omeprazole (PRILOSEC) 40 MG capsule Take 1 capsule (  40 mg total) by mouth daily. (Patient not taking: Reported on 09/02/2020)   No facility-administered encounter medications on file as of 09/02/2020.   Allergies  Allergen Reactions  . Pantoprazole Other (See Comments)    "Chest feels hot, stomach irritation"  . Aspirin Other (See Comments)    Abdominal pain   . Lubiprostone Other (See Comments)    Stomach irritation   . Vitamin C Other (See Comments)    Abdominal pain   Patient Active Problem List   Diagnosis Date Noted  .  Abdominal bloating 07/20/2020  . Atopic dermatitis 03/04/2020  . Rotator cuff disorder, left 07/28/2019  . 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 Aortic aneurysm ([place]) 03/26/2018  . New onset right bundle branch block (RBBB)   . Diabetes ([place]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  . Constipation 06/01/2017  . Hyperlipidemia 06/14/2016  . Dizziness 02/14/2016  . Chronic kidney disease 01/19/2016  . BPH (benign prostatic hyperplasia) 05/06/2015  . Generalized anxiety disorder 12/29/2014  . GERD 05/19/2010  . IBS (irritable bowel syndrome) 05/19/2010  . PERSONAL HX COLONIC POLYPS 05/19/2010  . Essential hypertension 04/10/2007  . Seasonal allergies 04/10/2007  . HIATAL HERNIA 04/10/2007   Social History   Socioeconomic History  . Marital status: Widowed    Spouse name: Not on file  . Number of children: 5  . Years of education: Not on file  . Highest education level: Not on file  Occupational History  . Not on file  Tobacco Use  . Smoking status: Never Smoker  . Smokeless tobacco: Never Used  Vaping Use  . Vaping Use: Never used  Substance and Sexual Activity  . Alcohol use: No  . Drug use: No  . Sexual activity: Not Currently  Other Topics Concern  . Not on file  Social History Narrative  . Not on file   Social Determinants of Health   Financial Resource Strain:   . Difficulty of Paying Living Expenses: Not on file  Food Insecurity:   . Worried About Charity fundraiser in the Last Year: Not on file  . Ran Out of Food in the Last Year: Not on file  Transportation Needs:   . Lack of Transportation (Medical): Not on file  . Lack of Transportation (Non-Medical): Not on file  Physical Activity:   . Days of Exercise per Week: Not on file  . Minutes of Exercise per Session: Not on file  Stress:   . Feeling of Stress : Not on file  Social Connections:   . Frequency of Communication with Friends and Family: Not on file  . Frequency of Social Gatherings with Friends and Family: Not on file    . Attends Religious Services: Not on file  . Active Member of Clubs or Organizations: Not on file  . Attends Archivist Meetings: Not on file  . Marital Status: Not on file  Intimate Partner Violence:   . Fear of Current or Ex-Partner: Not on file  . Emotionally Abused: Not on file  . Physically Abused: Not on file  . Sexually Abused: Not on file    [name] family history includes Hypertension in his mother.      Objective:    Vitals:   09/02/20 1358  BP: 90/60  Pulse: 72    Physical Exam Well-developed well-nourished elderly [place] male in no acute distress.  Height, Weight, 181 BMI 29.6  HEENT; nontraumatic normocephalic, EOMI, PE RR LA, sclera anicteric. Oropharynx; not examined today Neck; supple, no JVD Cardiovascular; regular  rate and rhythm with S1-S2, no murmur rub or gallop Pulmonary; Clear bilaterally Abdomen; soft, mild right mid quadrant tenderness, no guarding, nondistended, no palpable mass or hepatosplenomegaly, bowel sounds are active Rectal; not done today Skin; benign exam, no jaundice rash or appreciable lesions Extremities; no clubbing cyanosis or edema skin warm and dry Neuro/Psych; alert and oriented x4, grossly nonfocal mood and affect appropriate       Assessment & Plan:   #43  79 year old non-English-speaking [place] male with chronic GERD. Patient has not been on any medication recently as he stopped omeprazole because of concerns for side effects. No dysphagia or odynophagia. 2.  Chronic abdominal bloating and constipation consistent with functional constipation/IBS #3 colon cancer surveillance-up-to-date with very recent colonoscopy July 2021 with removal of 1 tiny tubular adenoma #4 adult onset diabetes mellitus 5.  Hypertension 6.  BPH 7.  Chronic kidney disease  Plan; start trial of famotidine 40 mg p.o. every morning and 20 mg p.o. every afternoon with dinner Start trial of Linzess 145 mcg p.o. every morning,  patient was given samples and prescription sent. We will plan office follow-up in 1 month to assess response.   Shayana Hornstein S Lajoyce Tamura PA-C 09/02/2020   Cc: Autry-Lott, Naaman Plummer, DO

## 2020-09-02 NOTE — Progress Notes (Signed)
Noted  

## 2020-09-07 ENCOUNTER — Telehealth: Payer: Self-pay | Admitting: Physician Assistant

## 2020-09-07 NOTE — Telephone Encounter (Signed)
Spoke with the patient. He wanted to know what he was supposed to do after he finished taking the 12 Linzess he was given as samples at the office. I explained to him we want him to call us and tell us if the Linzess is working. If it is working then we will send the prescription to the pharmacy. He expresses understanding and thanks me for the call.

## 2020-09-09 ENCOUNTER — Telehealth (HOSPITAL_COMMUNITY): Payer: Self-pay

## 2020-09-09 NOTE — Telephone Encounter (Signed)
Detailed instructions left on the patient's answering machine for patient's daughter to translate per the DPR. Asked to call back with any questions. S.Trafton Roker EMTP

## 2020-09-10 ENCOUNTER — Telehealth: Payer: Self-pay | Admitting: Physician Assistant

## 2020-09-10 NOTE — Telephone Encounter (Signed)
Spoke with the patient. He has been taking Linzess for 7 days. He has pain in his stomach and heartburn. He does not feel the Linzess is working for him. He does not take Omeprazole. (it is still on the med list) He does take Pepcid as it is directed. No good bowel movement in 8 days.

## 2020-09-13 ENCOUNTER — Telehealth: Payer: Self-pay | Admitting: Physician Assistant

## 2020-09-13 ENCOUNTER — Other Ambulatory Visit: Payer: Self-pay

## 2020-09-13 MED ORDER — LUBIPROSTONE 24 MCG PO CAPS: 24.0000 ug | ORAL_CAPSULE | Freq: Two times a day (BID) | ORAL | 3 refills | Status: DC

## 2020-09-13 NOTE — Telephone Encounter (Signed)
He will not take Miralax , as afraid contraindicate with his kidney issues... may take 2 Senekot-S daily to get bowels  moving and or fleets enema- then start trial of Amitiza 24 mcg BID.

## 2020-09-13 NOTE — Telephone Encounter (Signed)
Spoke with the patient. No more heartburn since stopping the Linzess. He did have a bowel movement the last 2 days. Willing to try Amitiza.Confirmed the pharmacy.

## 2020-09-14 ENCOUNTER — Telehealth: Payer: Self-pay | Admitting: Cardiovascular Disease

## 2020-09-14 NOTE — Telephone Encounter (Signed)
Called and spoke with patient with an interpreter, no answer. Left VM. In the meantime, I will forward this message to Mardene Celeste Via to see if she can help with prior authorization to get his tests covered.

## 2020-09-14 NOTE — Telephone Encounter (Signed)
Called and spoke with patient, he is still having issues with constipation. He was wondering if the generic Amitiza had been sent to the pharmacy. I advised that it was sent in yesterday and advised which pharmacy it was sent to. He stated he would pick it up tomorrow.

## 2020-09-14 NOTE — Telephone Encounter (Signed)
Patient calling back. He states he needs to know whether he can keep his appointment the 18th because he has to make arrangements for transportation. He would like a call back as soon as possible.

## 2020-09-14 NOTE — Telephone Encounter (Signed)
Omar Sanders is calling stating he received a letter from his insurance stating they will not cover his upcoming Echo and Dahlgren. Omar Sanders is requesting assistance to find a facility where these test can be performed and covered by his insurance so he can still have them performed. Please advise.

## 2020-09-15 ENCOUNTER — Encounter: Payer: Self-pay | Admitting: *Deleted

## 2020-09-16 ENCOUNTER — Ambulatory Visit (HOSPITAL_BASED_OUTPATIENT_CLINIC_OR_DEPARTMENT_OTHER): Payer: Medicare Other

## 2020-09-16 ENCOUNTER — Ambulatory Visit (HOSPITAL_COMMUNITY): Payer: Medicare Other | Attending: Cardiovascular Disease

## 2020-09-16 ENCOUNTER — Other Ambulatory Visit: Payer: Self-pay

## 2020-09-16 DIAGNOSIS — R9431 Abnormal electrocardiogram [ECG] [EKG]: Secondary | ICD-10-CM | POA: Insufficient documentation

## 2020-09-16 DIAGNOSIS — R0602 Shortness of breath: Secondary | ICD-10-CM

## 2020-09-16 DIAGNOSIS — R072 Precordial pain: Secondary | ICD-10-CM | POA: Diagnosis not present

## 2020-09-16 DIAGNOSIS — I4510 Unspecified right bundle-branch block: Secondary | ICD-10-CM | POA: Insufficient documentation

## 2020-09-16 DIAGNOSIS — R079 Chest pain, unspecified: Secondary | ICD-10-CM | POA: Insufficient documentation

## 2020-09-16 LAB — ECHOCARDIOGRAM COMPLETE
Area-P 1/2: 2.37 cm2
P 1/2 time: 895 msec
S' Lateral: 2.3 cm

## 2020-09-16 LAB — MYOCARDIAL PERFUSION IMAGING
LV dias vol: 60 mL (ref 62–150)
LV sys vol: 16 mL
Peak HR: 76 {beats}/min
Rest HR: 50 {beats}/min
SDS: 0
SRS: 0
SSS: 0
TID: 0.87

## 2020-09-16 MED ORDER — TECHNETIUM TC 99M TETROFOSMIN IV KIT
10.4000 | PACK | Freq: Once | INTRAVENOUS | Status: AC | PRN
  Administered 2020-09-16: 10.4 via INTRAVENOUS
  Filled 2020-09-16: qty 11

## 2020-09-16 MED ORDER — REGADENOSON 0.4 MG/5ML IV SOLN
0.4000 mg | Freq: Once | INTRAVENOUS | Status: AC
  Administered 2020-09-16: 0.4 mg via INTRAVENOUS

## 2020-09-16 MED ORDER — TECHNETIUM TC 99M TETROFOSMIN IV KIT
32.4000 | PACK | Freq: Once | INTRAVENOUS | Status: AC | PRN
  Administered 2020-09-16: 32.4 via INTRAVENOUS
  Filled 2020-09-16: qty 33

## 2020-09-20 NOTE — Progress Notes (Signed)
    SUBJECTIVE:   CHIEF COMPLAINT / HPI:   [name] is a 79 yo M who presents for the things below.   Discuss labs  T2DM, HLD Would like to discuss his cholesterol and diabetes. He would like to repeat labs today. He is worried about his cholesterol levels and would like to know how his diabetes is doing.   Eye lid sticking Ongoing issues with his right bottom eyelid sticking to his eye. He was seen by eye doctor this year and states his eye exam was normal. He was given refresh eye drops for dry eyes. He has a history of seasonal allergies and thinks his eyes are sticky due to this. He would like to try different eye drops.   COVID vaccination Afraid to get vaccine at pharmacy. Would like to receive his first shot today. Plans to return as scheduled for second shot.   PERTINENT  PMH / PSH: HTN, AA, IBS, BPH  OBJECTIVE:   BP (!) 102/58   Pulse 66   Wt 179 lb 9.6 oz (81.5 kg)   SpO2 97%   BMI 29.43 kg/m   General: Appears well, no acute distress. Age appropriate. Eyes: White sclera. Right eye lid without visible foreign body or edema and otherwise unremarkable. Cardiac: RRR, normal heart sounds, no murmurs Respiratory: CTAB, normal effort Extremities: No edema or cyanosis.  ASSESSMENT/PLAN:  1. Dry eyes - Refilled Olopatadine HCl 0.2 % SOLN; Place 1 drop into both eyes 2 (two) times daily.  Dispense: 2.5 mL; Refill: 2  2. Seasonal allergies - Refilled Olopatadine HCl 0.2 % SOLN; Place 1 drop into both eyes 2 (two) times daily.  Dispense: 2.5 mL; Refill: 2 - Refilled cetirizine (ZYRTEC) 10 MG tablet; Take 1 tablet (10 mg total) by mouth daily.  Dispense: 30 tablet; Refill: 0  3. Hyperlipidemia, unspecified hyperlipidemia type Discussed lab work and increased triglycerides that have shown improvement. Discussed dietary changes.  - Repeat panel in 1 year - Refilled atorvastatin (LIPITOR) 40 MG tablet; Take 1 tablet (40 mg total) by mouth daily.  Dispense: 90 tablet; Refill:  3  4. Diabetes Discussed well controlled A1c of  6.4.  -F/u in 4 months to recheck A1c  5. Encounter for immunization - Pfizer SARS-COV-2 [place]

## 2020-09-21 ENCOUNTER — Ambulatory Visit (INDEPENDENT_AMBULATORY_CARE_PROVIDER_SITE_OTHER): Payer: Medicare Other | Admitting: Family Medicine

## 2020-09-21 ENCOUNTER — Other Ambulatory Visit: Payer: Self-pay

## 2020-09-21 ENCOUNTER — Encounter: Payer: Self-pay | Admitting: Family Medicine

## 2020-09-21 VITALS — BP 102/58 | HR 66 | Wt 179.6 lb

## 2020-09-21 DIAGNOSIS — E785 Hyperlipidemia, unspecified: Secondary | ICD-10-CM

## 2020-09-21 DIAGNOSIS — Z23 Encounter for immunization: Secondary | ICD-10-CM | POA: Diagnosis not present

## 2020-09-21 DIAGNOSIS — J302 Other seasonal allergic rhinitis: Secondary | ICD-10-CM | POA: Diagnosis not present

## 2020-09-21 DIAGNOSIS — H04123 Dry eye syndrome of bilateral lacrimal glands: Secondary | ICD-10-CM

## 2020-09-21 DIAGNOSIS — E1122 Type 2 diabetes mellitus with diabetic chronic kidney disease: Secondary | ICD-10-CM | POA: Diagnosis not present

## 2020-09-21 MED ORDER — OLOPATADINE HCL 0.2 % OP SOLN: 1.0000 [drp] | Freq: Two times a day (BID) | OPHTHALMIC | 2 refills | Status: DC

## 2020-09-21 MED ORDER — CETIRIZINE HCL 10 MG PO TABS: 10.0000 mg | ORAL_TABLET | Freq: Every day | ORAL | 0 refills | Status: DC

## 2020-09-21 MED ORDER — ATORVASTATIN CALCIUM 40 MG PO TABS: 40.0000 mg | ORAL_TABLET | Freq: Every day | ORAL | 3 refills | Status: DC

## 2020-09-21 NOTE — Patient Instructions (Addendum)
It was wonderful to see you today.  Please bring ALL of your medications with you to every visit.   Today we talked about:  Your diabetes which is well controlled. We will recheck your A1c in March.   Your cholesterol levels will be recheck next September. In the meantime practice the dietary changes we discussed.   I refilled your eye drops, if this does not give relief you are more than welcome to revisit your eye doctor.   Thank you for getting your covid shot today! We have rescheduled you for your second shot below.   Please be sure to schedule follow up in March at the front  desk before you leave today.   Please call the clinic at [phone] if you have any concerns. It was our pleasure to serve you.  Dr. Janus Molder

## 2020-10-04 ENCOUNTER — Other Ambulatory Visit: Payer: Self-pay

## 2020-10-04 ENCOUNTER — Telehealth: Payer: Self-pay | Admitting: Physician Assistant

## 2020-10-04 MED ORDER — TRULANCE 3 MG PO TABS: 1.0000 | ORAL_TABLET | Freq: Every day | ORAL | 6 refills | Status: DC

## 2020-10-04 NOTE — Telephone Encounter (Signed)
Pt is requesting a call back from a nurse to discuss his bloating, pt did not disclose further information

## 2020-10-04 NOTE — Telephone Encounter (Signed)
Can send rx for Trulance 3 mg po daily #30 /3- stop Amitiza

## 2020-10-04 NOTE — Telephone Encounter (Signed)
Patient is advised.  

## 2020-10-04 NOTE — Telephone Encounter (Signed)
Spoke with the patient. He had bloating when on the Amitiza. He would stop it and then have no bloating. He is taking Senokot 1 to 2 daily. However it is costly for him. He would like to try another prescription. Suggestions? Failed Linzess. Does not want to take Miralax. Now has had bloating with Amitiza.

## 2020-10-06 ENCOUNTER — Telehealth: Payer: Self-pay | Admitting: Physician Assistant

## 2020-10-06 NOTE — Telephone Encounter (Signed)
Insurance will not cover the Trulance.  He will try samples before we pursue patient assistance options.

## 2020-10-06 NOTE — Telephone Encounter (Signed)
Inbound call from patient requesting a call back.  Stated he has some questions about in regards to his abdominal bloating.

## 2020-10-07 ENCOUNTER — Encounter: Payer: Self-pay | Admitting: *Deleted

## 2020-10-12 ENCOUNTER — Telehealth: Payer: Self-pay | Admitting: Family Medicine

## 2020-10-12 DIAGNOSIS — J302 Other seasonal allergic rhinitis: Secondary | ICD-10-CM

## 2020-10-12 NOTE — Telephone Encounter (Signed)
Patient is calling and would like for someone to call him to discuss questions she has about getting his covid vaccine. I sent him to the nurse line but he said he is not able to leave messages.   Please call patient to discuss. [phone]

## 2020-10-14 ENCOUNTER — Other Ambulatory Visit: Payer: Self-pay

## 2020-10-14 MED ORDER — TRULANCE 3 MG PO TABS
1.0000 | ORAL_TABLET | Freq: Every day | ORAL | 6 refills | Status: DC
Start: 2020-10-14 — End: 2020-10-18

## 2020-10-14 NOTE — Telephone Encounter (Signed)
Patient calling to follow up on previous message states he is seeking directions on what to do

## 2020-10-18 ENCOUNTER — Other Ambulatory Visit: Payer: Self-pay

## 2020-10-18 MED ORDER — FLUTICASONE PROPIONATE 50 MCG/ACT NA SUSP: 2.0000 | Freq: Every day | NASAL | 6 refills | Status: DC

## 2020-10-18 MED ORDER — CETIRIZINE HCL 10 MG PO TABS: 10.0000 mg | ORAL_TABLET | Freq: Every day | ORAL | 0 refills | Status: DC

## 2020-10-18 MED ORDER — TRULANCE 3 MG PO TABS
1.0000 | ORAL_TABLET | Freq: Every day | ORAL | 6 refills | Status: DC
Start: 2020-10-18 — End: 2020-11-23

## 2020-10-18 NOTE — Addendum Note (Signed)
Addended by: Caralee Ates on: 10/18/2020 03:39 PM   Modules accepted: Orders

## 2020-10-18 NOTE — Telephone Encounter (Signed)
Spoke with the patient and has headache and runny nose x2 weeks ago following first COVID shot. Taking tylenol and cetrizine. Out of Flonase. Would like refill of cetrizine and Flonase. Has known seasonal allergies. Patient to get COVID shot tomorrow. Voiced understanding.  Gerlene Fee, DO 10/18/2020, 3:38 PM PGY-2, [place]

## 2020-10-18 NOTE — Telephone Encounter (Signed)
Trying to get approval from the insurance company. I can give him more samples.

## 2020-10-19 ENCOUNTER — Ambulatory Visit (INDEPENDENT_AMBULATORY_CARE_PROVIDER_SITE_OTHER): Payer: Medicare Other

## 2020-10-19 ENCOUNTER — Other Ambulatory Visit: Payer: Self-pay

## 2020-10-19 DIAGNOSIS — Z23 Encounter for immunization: Secondary | ICD-10-CM | POA: Diagnosis not present

## 2020-10-19 NOTE — Progress Notes (Signed)
   Covid-19 Vaccination Clinic  Name:  Omar Sanders    MRN: [id number] DOB: 10/11/1941  10/19/2020  [name] was observed post Covid-19 immunization for 15 minutes without incident. He was provided with Vaccine Information Sheet and instruction to access the V-Safe system.   [name] was instructed to call 911 with any severe reactions post vaccine: Marland Kitchen Difficulty breathing  . Swelling of face and throat  . A fast heartbeat  . A bad rash all over body  . Dizziness and weakness   #2 Covid Vaccine administered LD without complication.

## 2020-10-20 NOTE — Telephone Encounter (Signed)
Trulance approved by insurance. Pharmacy notified via fax because they do not answer their phone. (called on multiple occasions unsuccessfully)

## 2020-10-26 ENCOUNTER — Telehealth: Payer: Self-pay | Admitting: Physician Assistant

## 2020-11-02 NOTE — Telephone Encounter (Signed)
Called the patient. No answer. No voicemail. 

## 2020-11-03 NOTE — Telephone Encounter (Signed)
Patient calls back. He is not tolerating the Trulance. He develops abdominal bloating and discomfort with it. He would like to take Senokot 2 to 3 daily as needed. He feels this works the best for him. Confirms he has a daily bowel movement with Senokot.  He agrees to call us if he has further issues.

## 2020-11-11 DIAGNOSIS — R3915 Urgency of urination: Secondary | ICD-10-CM | POA: Diagnosis not present

## 2020-11-17 ENCOUNTER — Telehealth: Payer: Self-pay | Admitting: Cardiovascular Disease

## 2020-11-17 ENCOUNTER — Telehealth: Payer: Self-pay | Admitting: Physician Assistant

## 2020-11-17 NOTE — Telephone Encounter (Signed)
° °  [facility] Pre-operative Risk Assessment    HEARTCARE STAFF: - Please ensure there is not already an duplicate clearance open for this procedure. - Under Visit Info/Reason for Call, type in Other and utilize the format Clearance MM/DD/YY or Clearance TBD. Do not use dashes or single digits. - If request is for dental extraction, please clarify the # of teeth to be extracted.  Request for surgical clearance:  1. What type of surgery is being performed? Bipolar transurethral resection of prostate   2. When is this surgery scheduled? TBD   3. What type of clearance is required (medical clearance vs. Pharmacy clearance to hold med vs. Both)? Medical  4. Are there any medications that need to be held prior to surgery and how long? No    5. Practice name and name of physician performing surgery? Dr. Ellison Hughs, [facility]   6. What is the office phone number? [phone]   7.   What is the office fax number? [phone]  8.   Anesthesia type (None, local, MAC, general) ? general   Omar Sanders 11/17/2020, 12:59 PM  _________________________________________________________________   (provider comments below)

## 2020-11-17 NOTE — Telephone Encounter (Signed)
Pls call pt. He states that famotidine is not helping him anymore.

## 2020-11-18 ENCOUNTER — Other Ambulatory Visit: Payer: Self-pay

## 2020-11-18 MED ORDER — PANTOPRAZOLE SODIUM 40 MG PO TBEC: DELAYED_RELEASE_TABLET | ORAL | 2 refills | Status: DC

## 2020-11-18 NOTE — Telephone Encounter (Signed)
   Primary Cardiologist: Evalina Field, MD  Chart reviewed as part of pre-operative protocol coverage. Because of Omar Sanders's past medical history and time since last visit, he will require a follow-up visit in order to better assess preoperative cardiovascular risk.  This has been scheduled for 11/23/20 with Dr. Audie Box. Cardiac clearance will be addressed at that visit. Appointment notes updated.   Will route to Dr. Audie Box as an Juluis Rainier. Will route to requesting office so they are aware.  Will remove from preop pool.  Loel Dubonnet, NP  11/18/2020, 10:45 AM

## 2020-11-18 NOTE — Telephone Encounter (Signed)
Spoke with the patient. He tells me his stomach is hurting. The Famotidine was working but does not seem to help as well now. He declines to go back on Omeprazole. Denies constipation issues. He uses Senekot for his bowels.  He is interested in trying a different medication for his indigestion. Offered an appointment. Patient tells me it would be March before he could come back in.

## 2020-11-18 NOTE — Telephone Encounter (Signed)
Spoke with the patient. Advised of the Pantoprazole. He will try it. Agrees to make an appointment if this does not help.

## 2020-11-18 NOTE — Telephone Encounter (Signed)
Try pantoprazole 40 mg daily 

## 2020-11-20 NOTE — Progress Notes (Signed)
Cardiology Office Note:   Date:  11/23/2020  NAME:  Omar Sanders    MRN: [id number] DOB:  [birth year]   PCP:  Gerlene Fee, DO  Cardiologist:  Evalina Field, MD   Referring MD: Gerlene Fee, DO   Chief Complaint  Patient presents with  . Pre-op Exam   History of Present Illness:   Omar Sanders is a 80 y.o. male with a hx of RBBB, 1AVB, HTN, dilated aorta, HLD who presents for follow-up. Plans for preoperative assessment. He reports he is doing fairly well since her last visit. He still has chronic left-sided shoulder pain. Worse with movement of the left arm. He had recent stress test and echocardiogram are normal. Left heart catheterization in 2010 with mild CAD. He reports he can climb a flight of stairs without any major issues. He reports he will have gallbladder surgery. He does not remember who the surgeon was. He walks with his daughter to and from school in the mornings and afternoons. No significant symptoms concerning for angina. He seems to be doing well. Blood pressure 156. Denies any symptoms in office today. He needs a refill of his cetirizine.  Problem List 1. 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 HTN 4. 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 HLD -T chol 130, HDL 33, LDL 68, TG 147 6. CAD -non-obstructive CAD  -LHC 2010: 50% mLAD; 40% mRCA -normal MPI 09/16/2020  Past Medical History: Past Medical History:  Diagnosis Date  . Allergy   . Anal fissure   . Anemia   . Anxiety   . Arthritis    all over body   . Benign prostatic hyperplasia   . Bilateral lower extremity edema 03/22/2018  . Cataract    removed bilat   . Chest pain 02/2018  . Chronic kidney disease   . Chronic pain   . Colon polyp   . GERD (gastroesophageal reflux disease)   . Headache(784.0)   . Hemorrhoids   . Hyperlipidemia    Hx: of  . Hypertension   . IBS (irritable bowel syndrome)   . Incontinent of urine    Hx:of  . Migraines   . Neuromuscular disorder  ([place])    periferal neuropathy per patient  . New onset right bundle branch block (RBBB)   . OSA (obstructive sleep apnea) 11/2009   sleep study with mild OSA, pt unwilling to use CPAP  . Seizures ([place])    pt states he has never had a seizure   . Sleep apnea    mild     Past Surgical History: Past Surgical History:  Procedure Laterality Date  . 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 CATARACT EXTRACTION     bilat  . CHOLECYSTECTOMY  2008  . COLONOSCOPY  2012   Dr Collene Mares  . HEMORRHOID SURGERY     anal surgery  . INGUINAL HERNIA REPAIR Bilateral 03/11/2013   Procedure: LAPAROSCOPIC BILATERAL INGUINAL HERNIA REPAIR;  Surgeon: Ralene Ok, MD;  Location: [facility];  Service: General;  Laterality: Bilateral;  . INSERTION OF MESH Bilateral 03/11/2013   Procedure: INSERTION OF MESH;  Surgeon: Ralene Ok, MD;  Location: [facility];  Service: General;  Laterality: Bilateral;  . NM MYOCAR PERF WALL MOTION  05/27/2009   mild iscemia mid inferio & apical regions.  . NM MYOCAR PERF WALL MOTION  11/01/2012   negative for ischemia  . POLYPECTOMY    .  US ECHOCARDIOGRAPHY  05/27/2009   mild mitral annular ca+,AOV mildly sclerotic    Current Medications: Current Meds  Medication Sig  . acetaminophen (TYLENOL) 325 MG tablet Take 2 tablets (650 mg total) by mouth every 6 (six) hours as needed for mild pain (or Fever >/= 101).  Marland Kitchen atorvastatin (LIPITOR) 40 MG tablet Take 1 tablet (40 mg total) by mouth daily.  . carbamide peroxide (DEBROX) 6.5 % OTIC solution 5 drops 2 (two) times daily.  . famotidine (PEPCID) 20 MG tablet Take 2 tablets in the morning and 1 tablet in the evening  . fluticasone (FLONASE) 50 MCG/ACT nasal spray Place 2 sprays into both nostrils daily.  Marland Kitchen lisinopril (ZESTRIL) 5 MG tablet Take 1 tablet (5 mg total) by mouth at bedtime.  . torsemide (DEMADEX) 20 MG tablet Take 20 mg by mouth daily.  . [DISCONTINUED] cetirizine  (ZYRTEC) 10 MG tablet Take 1 tablet (10 mg total) by mouth daily.  . [DISCONTINUED] Olopatadine HCl 0.2 % SOLN Place 1 drop into both eyes 2 (two) times daily.  . [DISCONTINUED] pantoprazole (PROTONIX) 40 MG tablet Take 1 tablet 30 minutes before first meal every day.  . [DISCONTINUED] Plecanatide (TRULANCE) 3 MG TABS Take 1 tablet by mouth daily.  . [DISCONTINUED] tamsulosin (FLOMAX) 0.4 MG CAPS capsule Take 2 capsules (0.8 mg total) by mouth daily after breakfast.     Allergies:    Pantoprazole, Aspirin, Lubiprostone, and Vitamin c   Social History: Social History   Socioeconomic History  . Marital status: Widowed    Spouse name: Not on file  . Number of children: 5  . Years of education: Not on file  . Highest education level: Not on file  Occupational History  . Not on file  Tobacco Use  . Smoking status: Never Smoker  . Smokeless tobacco: Never Used  Vaping Use  . Vaping Use: Never used  Substance and Sexual Activity  . Alcohol use: No  . Drug use: No  . Sexual activity: Not Currently  Other Topics Concern  . Not on file  Social History Narrative  . Not on file   Social Determinants of Health   Financial Resource Strain: Not on file  Food Insecurity: Not on file  Transportation Needs: Not on file  Physical Activity: Not on file  Stress: Not on file  Social Connections: Not on file     Family History: The patient's family history includes Hypertension in his mother. There is no history of Colon cancer, Colon polyps, Esophageal cancer, Rectal cancer, or Stomach cancer.  ROS:   All other ROS reviewed and negative. Pertinent positives noted in the HPI.     EKGs/Labs/Other Studies Reviewed:   The following studies were personally reviewed by me today:  EKG:  EKG is ordered today.  The ekg ordered today demonstrates sinus bradycardia heart rate 58, right bundle branch block, first-degree block note, and was personally reviewed by me.   NM Stress  09/16/2020  The left ventricular ejection fraction is hyperdynamic (>65%).  Nuclear stress EF: 73%.  The study is normal.  This is a low risk study.  There was no ST segment deviation noted during stress.  No T wave inversion was noted during stress.  TTE 09/16/2020 1. Left ventricular ejection fraction, by estimation, is 65 to 70%. The  left ventricle has normal function. The left ventricle has no regional  wall motion abnormalities. Left ventricular diastolic parameters are  consistent with Grade I diastolic  dysfunction (impaired relaxation).  2. Right ventricular systolic function is normal. The right ventricular  size is normal. There is normal pulmonary artery systolic pressure.  3. The mitral valve is grossly normal. No evidence of mitral valve  regurgitation. No evidence of mitral stenosis.  4. The aortic valve is normal in structure. Aortic valve regurgitation is  not visualized.  5. Aortic dilatation noted. There is mild dilatation of the ascending  aorta, measuring 45 mm.   Recent Labs: 01/20/2020: Hemoglobin 14.1; Platelets 202 08/23/2020: BNP 16.3; BUN 16; Creatinine, Ser 1.38; Potassium 3.9; Sodium 141   Recent Lipid Panel    Component Value Date/Time   CHOL 117 07/14/2020 1540   TRIG 179 (H) 07/14/2020 1540   HDL 24 (L) 07/14/2020 1540   CHOLHDL 4.9 07/14/2020 1540   CHOLHDL 7.0 (H) 07/12/2015 0938   VLDL 57 (H) 07/12/2015 0938   LDLCALC 63 07/14/2020 1540    Physical Exam:   VS:  BP (!) 100/56   Pulse (!) 58   Ht 5\' 7"  (1.702 m)   Wt 192 lb 9.6 oz (87.4 kg)   SpO2 98%   BMI 30.17 kg/m    Wt Readings from Last 3 Encounters:  11/23/20 192 lb 9.6 oz (87.4 kg)  09/21/20 179 lb 9.6 oz (81.5 kg)  09/16/20 181 lb (82.1 kg)    General: Well nourished, well developed, in no acute distress Head: Atraumatic, normal size  Eyes: PEERLA, EOMI  Neck: Supple, no JVD Endocrine: No thryomegaly Cardiac: Normal S1, S2; RRR; no murmurs, rubs, or  gallops Lungs: Clear to auscultation bilaterally, no wheezing, rhonchi or rales  Abd: Soft, nontender, no hepatomegaly  Ext: No edema, pulses 2+ Musculoskeletal: No deformities, BUE and BLE strength normal and equal Skin: Warm and dry, no rashes   Neuro: Alert and oriented to person, place, time, and situation, CNII-XII grossly intact, no focal deficits  Psych: Normal mood and affect   ASSESSMENT:   Omar Sanders is a 80 y.o. male who presents for the following: 1. Preoperative cardiovascular examination   2. RBBB   3. First degree AV block   4. Aortic dilatation (HCC)   5. Seasonal allergies   6. Coronary artery disease involving native coronary artery of native heart without angina pectoris     PLAN:   1. Preoperative cardiovascular examination -Revised Cardiac Risk Index =  0 which equates to 0.4% (very low risk) estimated risk of perioperative myocardial infarction, pulmonary edema, ventricular fibrillation, cardiac arrest, or complete heart block.  -Recent echocardiogram and nuclear medicine stress test normal. He has had chronic left arm pain for years. This is not concerning for angina. -He can complete greater than 4 METS without any significant limitations.  -No further cardiac testing is recommended prior to surgery.  -Our service is available as needed in the peri-operative period.   -Okay to hold aspirin as needed per surgery.  2. RBBB 3. First degree AV block -Stable. No alarming symptoms. He had dizziness for years. Avoid AV nodal blocking agents.  4. Aortic dilatation (HCC) -Ascending aorta measures 4 cm. Likely normal for age.  5. Seasonal allergies -Refill cetirizine  6. CAD -Left heart catheterization in 2010 with 50% mid LAD and 40% RCA stenoses. Recent nuclear medicine stress test November 2021 with no evidence of ischemia. Would recommend continue Lipitor 40 mg a day. Most recent LDL cholesterol 63. Would recommend to continue aspirin as well. He may hold  aspirin perioperative setting.  Disposition: Return in about 1 year (around 11/23/2021).  Medication Adjustments/Labs and Tests Ordered: Current medicines are reviewed at length with the patient today.  Concerns regarding medicines are outlined above.  Orders Placed This Encounter  Procedures  . EKG 12-Lead   Meds ordered this encounter  Medications  . cetirizine (ZYRTEC) 10 MG tablet    Sig: Take 1 tablet (10 mg total) by mouth daily.    Dispense:  30 tablet    Refill:  2    Patient Instructions  Medication Instructions:  The current medical regimen is effective;  continue present plan and medications.  *If you need a refill on your cardiac medications before your next appointment, please call your pharmacy*  Follow-Up: At [facility], you and your health needs are our priority.  As part of our continuing mission to provide you with exceptional heart care, we have created designated Provider Care Teams.  These Care Teams include your primary Cardiologist (physician) and Advanced Practice Providers (APPs -  Physician Assistants and Nurse Practitioners) who all work together to provide you with the care you need, when you need it.  We recommend signing up for the patient portal called "MyChart".  Sign up information is provided on this After Visit Summary.  MyChart is used to connect with patients for Virtual Visits (Telemedicine).  Patients are able to view lab/test results, encounter notes, upcoming appointments, etc.  Non-urgent messages can be sent to your provider as well.   To learn more about what you can do with MyChart, go to [email or web address].    Your next appointment:   12 month(s)  The format for your next appointment:   In Person  Provider:   Eleonore Chiquito, MD          Time Spent with Patient: I have spent a total of 25 minutes with patient reviewing hospital notes, telemetry, EKGs, labs and examining the patient as well as establishing an assessment  and plan that was discussed with the patient.  > 50% of time was spent in direct patient care.  Signed, Addison Naegeli. Audie Box, [place]  [address], [place], [place] [address] [phone]  11/23/2020 10:51 AM

## 2020-11-22 ENCOUNTER — Other Ambulatory Visit: Payer: Self-pay | Admitting: Family Medicine

## 2020-11-22 DIAGNOSIS — J302 Other seasonal allergic rhinitis: Secondary | ICD-10-CM

## 2020-11-23 ENCOUNTER — Other Ambulatory Visit: Payer: Self-pay

## 2020-11-23 ENCOUNTER — Ambulatory Visit (INDEPENDENT_AMBULATORY_CARE_PROVIDER_SITE_OTHER): Payer: Medicare Other | Admitting: Cardiovascular Disease

## 2020-11-23 ENCOUNTER — Encounter: Payer: Self-pay | Admitting: Cardiovascular Disease

## 2020-11-23 VITALS — BP 100/56 | HR 58 | Ht 67.0 in | Wt 192.6 lb

## 2020-11-23 DIAGNOSIS — I440 Atrioventricular block, first degree: Secondary | ICD-10-CM | POA: Diagnosis not present

## 2020-11-23 DIAGNOSIS — I4510 Unspecified right bundle-branch block: Secondary | ICD-10-CM

## 2020-11-23 DIAGNOSIS — Z01810 Encounter for preprocedural cardiovascular examination: Secondary | ICD-10-CM | POA: Diagnosis not present

## 2020-11-23 DIAGNOSIS — J302 Other seasonal allergic rhinitis: Secondary | ICD-10-CM | POA: Diagnosis not present

## 2020-11-23 DIAGNOSIS — I77819 Aortic ectasia, unspecified site: Secondary | ICD-10-CM | POA: Diagnosis not present

## 2020-11-23 DIAGNOSIS — I2510 Atherosclerotic heart disease of native coronary artery without angina pectoris: Secondary | ICD-10-CM | POA: Diagnosis not present

## 2020-11-23 MED ORDER — CETIRIZINE HCL 10 MG PO TABS: 10.0000 mg | ORAL_TABLET | Freq: Every day | ORAL | 2 refills | Status: DC

## 2020-11-23 NOTE — Patient Instructions (Addendum)

## 2020-11-26 ENCOUNTER — Other Ambulatory Visit: Payer: Self-pay | Admitting: Urology

## 2020-12-03 NOTE — Telephone Encounter (Signed)
Spoke with the patient. He is taking Pantoprazole. Despite this medication he is experiencing epigastric pain and reflux. He tells me he has been on famotidine, omeprazole and pantoprazole. None of these has helped.  Appointment scheduled for 12/14/20 at 2:00 pm. He will call back if he cannot find transportation. We will reschedule the appointment if needed.

## 2020-12-03 NOTE — Telephone Encounter (Signed)
Inbound call from patient stating pantoprazole is longer helping.  Per last note did advise patient would need to come in for an office visit but insisted a call back from the nurse.  Please advise.

## 2020-12-13 DIAGNOSIS — N1831 Chronic kidney disease, stage 3a: Secondary | ICD-10-CM | POA: Diagnosis not present

## 2020-12-14 ENCOUNTER — Ambulatory Visit: Payer: Medicare Other | Admitting: Physician Assistant

## 2020-12-24 DIAGNOSIS — D631 Anemia in chronic kidney disease: Secondary | ICD-10-CM | POA: Diagnosis not present

## 2020-12-24 DIAGNOSIS — N1831 Chronic kidney disease, stage 3a: Secondary | ICD-10-CM | POA: Diagnosis not present

## 2020-12-24 DIAGNOSIS — R609 Edema, unspecified: Secondary | ICD-10-CM | POA: Diagnosis not present

## 2020-12-24 DIAGNOSIS — I129 Hypertensive chronic kidney disease with stage 1 through stage 4 chronic kidney disease, or unspecified chronic kidney disease: Secondary | ICD-10-CM | POA: Diagnosis not present

## 2020-12-24 DIAGNOSIS — E559 Vitamin D deficiency, unspecified: Secondary | ICD-10-CM | POA: Diagnosis not present

## 2020-12-30 ENCOUNTER — Ambulatory Visit (INDEPENDENT_AMBULATORY_CARE_PROVIDER_SITE_OTHER): Payer: Medicare Other | Admitting: Physician Assistant

## 2020-12-30 ENCOUNTER — Encounter: Payer: Self-pay | Admitting: Physician Assistant

## 2020-12-30 VITALS — BP 104/60 | HR 76 | Ht 65.5 in | Wt 184.0 lb

## 2020-12-30 DIAGNOSIS — R143 Flatulence: Secondary | ICD-10-CM | POA: Diagnosis not present

## 2020-12-30 DIAGNOSIS — R140 Abdominal distension (gaseous): Secondary | ICD-10-CM

## 2020-12-30 DIAGNOSIS — K581 Irritable bowel syndrome with constipation: Secondary | ICD-10-CM | POA: Diagnosis not present

## 2020-12-30 MED ORDER — LUBIPROSTONE 24 MCG PO CAPS: 24.0000 ug | ORAL_CAPSULE | Freq: Two times a day (BID) | ORAL | 3 refills | Status: DC

## 2020-12-30 MED ORDER — RIFAXIMIN 550 MG PO TABS: 550.0000 mg | ORAL_TABLET | Freq: Three times a day (TID) | ORAL | 0 refills | Status: AC

## 2020-12-30 MED ORDER — ESOMEPRAZOLE MAGNESIUM 40 MG PO CPDR: 40.0000 mg | DELAYED_RELEASE_CAPSULE | Freq: Every morning | ORAL | 11 refills | Status: DC

## 2020-12-30 NOTE — Progress Notes (Signed)
Subjective:    Patient ID: Omar Sanders, male    DOB: [birth year], 80 y.o.   MRN: [id number]  HPI  Omar Sanders is an 80 year old non-English-speaking [place] male, established with Dr. Henrene Pastor, and last seen by myself in November 2021.  He has had chronic issues with abdominal bloating constipation, and GERD. He has diagnosis of IBS, adult onset diabetes mellitus, BPH and chronic kidney disease. He had colonoscopy in July 2021 due to history of adenomatous colon polyps.  He had a 1 mm polyp removed which was a tubular adenoma and no follow-up recommended due to age. Last EGD in 2018 with mild grade a esophagitis and otherwise negative exam. Patient had been given MiraLAX in the past for constipation but at the time of last visit said he did not want to use MiraLAX because he read this should not be used in chronic kidney disease.  He had been given a trial of Linzess, which apparently increased his heartburn, this was discontinued and we then sent a trial of Amitiza 24 mcg twice daily.  He had stopped this at some point in the past couple of months as he was not convinced that it was working and has been using Senokot but says that he is requiring increasing dosage, now up to 2 to 3 tablets/day to produce a bowel movement.  He says he does have bowel movements if he stays on the laxative. He is asking if he can try Amitiza again. He has also been given several PPIs in the past and has not stayed on any of them because he becomes convinced that they are not helping his symptoms which is primarily bloating gas belching, some acid reflux and he mentions intermittent cough.  He has no complaints of dysphagia or odynophagia. He continues to complain of ongoing sensation of bloating and gas.  He is scheduled for TURP mid April.  Review of Systems Pertinent positive and negative review of systems were noted in the above HPI section.  All other review of systems was otherwise negative.  Outpatient Encounter  Medications as of 12/30/2020  Medication Sig  . acetaminophen (TYLENOL) 325 MG tablet Take 2 tablets (650 mg total) by mouth every 6 (six) hours as needed for mild pain (or Fever >/= 101).  Marland Kitchen alfuzosin (UROXATRAL) 10 MG 24 hr tablet Take 10 mg by mouth daily.  Marland Kitchen atorvastatin (LIPITOR) 40 MG tablet Take 1 tablet (40 mg total) by mouth daily.  . carbamide peroxide (DEBROX) 6.5 % OTIC solution 5 drops 2 (two) times daily.  . cetirizine (ZYRTEC) 10 MG tablet Take 1 tablet (10 mg total) by mouth daily.  Marland Kitchen esomeprazole (NEXIUM) 40 MG capsule Take 1 capsule (40 mg total) by mouth in the morning.  . finasteride (PROSCAR) 5 MG tablet Take 5 mg by mouth daily.  . fluticasone (FLONASE) 50 MCG/ACT nasal spray Place 2 sprays into both nostrils daily.  Marland Kitchen lisinopril (ZESTRIL) 5 MG tablet Take 1 tablet (5 mg total) by mouth at bedtime.  Marland Kitchen lubiprostone (AMITIZA) 24 MCG capsule Take 1 capsule (24 mcg total) by mouth 2 (two) times daily with a meal.  . rifaximin (XIFAXAN) 550 MG TABS tablet Take 1 tablet (550 mg total) by mouth 3 (three) times daily for 14 days.  Marland Kitchen torsemide (DEMADEX) 20 MG tablet Take 20 mg by mouth daily.  . [DISCONTINUED] famotidine (PEPCID) 20 MG tablet Take 2 tablets in the morning and 1 tablet in the evening (Patient not taking: Reported on 12/30/2020)  No facility-administered encounter medications on file as of 12/30/2020.   Allergies  Allergen Reactions  . Pantoprazole Other (See Comments)    "Chest feels hot, stomach irritation"  . Aspirin Other (See Comments)    Abdominal pain   . Lubiprostone Other (See Comments)    Stomach irritation   . Vitamin C Other (See Comments)    Abdominal pain   Patient Active Problem List   Diagnosis Date Noted  . Abdominal bloating 07/20/2020  . Atopic dermatitis 03/04/2020  . Rotator cuff disorder, left 07/28/2019  . 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 Aortic aneurysm ([place]) 03/26/2018  . New onset right bundle branch block (RBBB)   .  Diabetes ([place]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  . Constipation 06/01/2017  . Hyperlipidemia 06/14/2016  . Dizziness 02/14/2016  . Chronic kidney disease 01/19/2016  . BPH (benign prostatic hyperplasia) 05/06/2015  . Generalized anxiety disorder 12/29/2014  . GERD 05/19/2010  . IBS (irritable bowel syndrome) 05/19/2010  . PERSONAL HX COLONIC POLYPS 05/19/2010  . Essential hypertension 04/10/2007  . Seasonal allergies 04/10/2007  . HIATAL HERNIA 04/10/2007   Social History   Socioeconomic History  . Marital status: Widowed    Spouse name: Not on file  . Number of children: 5  . Years of education: Not on file  . Highest education level: Not on file  Occupational History  . Not on file  Tobacco Use  . Smoking status: Never Smoker  . Smokeless tobacco: Never Used  Vaping Use  . Vaping Use: Never used  Substance and Sexual Activity  . Alcohol use: No  . Drug use: No  . Sexual activity: Not Currently  Other Topics Concern  . Not on file  Social History Narrative  . Not on file   Social Determinants of Health   Financial Resource Strain: Not on file  Food Insecurity: Not on file  Transportation Needs: Not on file  Physical Activity: Not on file  Stress: Not on file  Social Connections: Not on file  Intimate Partner Violence: Not on file    Omar Sanders family history includes Hypertension in his mother.      Objective:    Vitals:   12/30/20 0905  BP: 104/60  Pulse: 76  SpO2: 96%    Physical Exam Well-developed well-nourished elderly [place] male in no acute distress.  Accompanied by interpreter height, Weight, 184 BMI 30.15  HEENT; nontraumatic normocephalic, EOMI, PE R LA, sclera anicteric. Oropharynx; not examined today Neck; supple, no JVD Cardiovascular; regular rate and rhythm with S1-S2, no murmur rub or gallop Pulmonary; Clear bilaterally Abdomen; soft,  nondistended, no focal tenderness, no palpable mass or hepatosplenomegaly, bowel sounds are active Rectal; not  done today Skin; benign exam, no jaundice rash or appreciable lesions Extremities; no clubbing cyanosis or edema skin warm and dry Neuro/Psych; alert and oriented x4, grossly nonfocal mood and affect appropriate       Assessment & Plan:   #1 chronic GERD-patient has not had good response to any of the medications given thus far.  Will give him a trial of Nexium 40 mg p.o. every morning AC breakfast.  We also discussed a strict antireflux regimen today and he was given literature. #2 chronic bloating-discussed avoidance of dairy, artificial sweeteners and carbonates.  Discussed breath testing for possible SIBO versus empiric trial of Xifaxan.  Patient is amenable to empiric course of Xifaxan 550 mg p.o. 3 times daily x14 days, prescription will be sent.  #3 chronic constipation-restart Amitiza 24 mcg p.o. twice daily.  Discussed continue liberal water intake at least 60 ounces per day, and patient may continue 1-2 Senokot per day as needed #4 adult onset diabetes mellitus 5.  BPH 6.  Anxiety 7.  History of adenomatous colon polyps-up-to-date with colonoscopy done July 2021  8.  Hypertension  Plan, as outlined above We will plan to follow-up in the office in 4 to 6 weeks If no improvement in symptoms will consider further imaging, and possibly repeat EGD with Dr. Henrene Pastor.  Omar Sanders S Omar Kilty PA-C 12/30/2020   Cc: Autry-Lott, Naaman Plummer, DO

## 2020-12-30 NOTE — Progress Notes (Signed)
i

## 2020-12-30 NOTE — Progress Notes (Signed)
Assessment and plan reviewed 

## 2020-12-30 NOTE — Patient Instructions (Addendum)
If you are age 82 or older, your body mass index should be between 23-30. Your Body mass index is 30.15 kg/m. If this is out of the aforementioned range listed, please consider follow up with your Primary Care Provider.  If you are age 17 or younger, your body mass index should be between 19-25. Your Body mass index is 30.15 kg/m. If this is out of the aformentioned range listed, please consider follow up with your Primary Care Provider.   START Amitiza (Lubiprostone) 24 mcg 1 capsule twice daily START Nexium (Esomeprazole) 40 mg 1 capsule every morning  We have sent your demographic information and a prescription for Xifaxan to [facility]. This pharmacy is able to get medication approved through insurance and get you the lowest copay possible. If you have not heard from them within 1 week, please call our office at [phone] to let us know.  Follow a low gas diet.  You have been scheduled to follow up with Dr. Henrene Pastor on February 07, 2021 at 10:00 am.  Thank you for entrusting me with your care and choosing [facility].  Amy Esterwood, PA-C

## 2021-01-04 ENCOUNTER — Telehealth: Payer: Self-pay | Admitting: Physician Assistant

## 2021-01-04 NOTE — Telephone Encounter (Signed)
Returned phone call to patient, he advised that he has picked up his Nexium and Amitiza, but was still waiting on his Xifaxin. I advised that I have received a fax that stated that [facility] was working prior authorization for his medication to see if insurance would cover it. He asked about a co-pay, I let him know that [facility] would let him know what he would need to pay when they call to set up mailing out his medication.

## 2021-01-14 NOTE — Telephone Encounter (Signed)
Pt called to inform that his pharmacy told him that Xifaxan was not approved. He would like to speak with you about it. Pls call him.

## 2021-01-17 NOTE — Telephone Encounter (Signed)
Spoke with patient about his Mcarthur Rossetti being denied, I advised that I could do an appeal or see if Amy Trellis Paganini would want to try something else. Mr. Mcanany asked if I knew about the co-pay and I advised him I did not. He let me know that he did not have any money to pay for any more medications. I let him know that we would try to see if we could get him some samples.

## 2021-01-19 NOTE — Telephone Encounter (Signed)
Patient has been contacted and notified that samples are at the front for him to pick up.

## 2021-01-20 ENCOUNTER — Other Ambulatory Visit: Payer: Self-pay

## 2021-01-20 ENCOUNTER — Ambulatory Visit (INDEPENDENT_AMBULATORY_CARE_PROVIDER_SITE_OTHER): Payer: Medicare Other | Admitting: Family Medicine

## 2021-01-20 ENCOUNTER — Encounter: Payer: Self-pay | Admitting: Family Medicine

## 2021-01-20 VITALS — BP 121/62 | HR 63 | Wt 185.5 lb

## 2021-01-20 DIAGNOSIS — E1122 Type 2 diabetes mellitus with diabetic chronic kidney disease: Secondary | ICD-10-CM

## 2021-01-20 DIAGNOSIS — E785 Hyperlipidemia, unspecified: Secondary | ICD-10-CM

## 2021-01-20 DIAGNOSIS — I10 Essential (primary) hypertension: Secondary | ICD-10-CM

## 2021-01-20 LAB — POCT GLYCOSYLATED HEMOGLOBIN (HGB A1C): HbA1c, POC (controlled diabetic range): 6.4 % (ref 0.0–7.0)

## 2021-01-20 MED ORDER — DICLOFENAC SODIUM 1 % EX GEL: 2.0000 g | Freq: Four times a day (QID) | CUTANEOUS | 1 refills | Status: DC | PRN

## 2021-01-20 MED ORDER — OLOPATADINE HCL 0.1 % OP SOLN: 1.0000 [drp] | Freq: Two times a day (BID) | OPHTHALMIC | 12 refills | Status: DC

## 2021-01-20 NOTE — Assessment & Plan Note (Signed)
He is not consistent with his antihypertensive regimen.  We discussed that many things can affect blood pressure.  He was encouraged to take his blood pressure medication every day unless he was experiencing headaches, dizziness, lightheadedness.  He was also instructed on an appropriate protocol for at home blood pressure measurements. -Continue lisinopril 5 mg

## 2021-01-20 NOTE — Progress Notes (Signed)
    SUBJECTIVE:   CHIEF COMPLAINT / HPI:   Diabetes Currently diet controlled.  No new concerns at this time.  Hypertension He reports that he currently takes lisinopril 5 mg occasionally.  He checks his blood pressure every night and does not take his blood pressure medication if he feels that his blood pressure is too low.  He denies any headaches, dizziness, lightheadedness.  He does not have specific criteria for this.  Additionally, he has concerns that his blood pressure elevates to systolics of 622 occasionally in the evening.  Hyperlipidemia He reports that he has been taking his atorvastatin daily and wants to make sure that his cholesterol is well controlled.  Allergies He requested refill on his eyedrops for itching eyes.  PERTINENT  PMH / PSH: Diabetes, CKD, hyperlipidemia  OBJECTIVE:   BP 121/62   Pulse 63   Wt 185 lb 8 oz (84.1 kg)   SpO2 97%   BMI 30.40 kg/m   General: Alert and cooperative and appears to be in no acute distress Resp: breathing comfortably on room air.  Extremities: No peripheral edema. Warm/ well perfused.  Strong radial pulses. Neuro: Cranial nerves grossly intact  Diabetic Foot Exam - Simple   Simple Foot Form Diabetic Foot exam was performed with the following findings: Yes 01/20/2021  1:56 PM  Visual Inspection No deformities, no ulcerations, no other skin breakdown bilaterally: Yes Sensation Testing Intact to touch and monofilament testing bilaterally: Yes Pulse Check Posterior Tibialis and Dorsalis pulse intact bilaterally: Yes Comments      ASSESSMENT/PLAN:   Diabetes (HCC) A1c under 7 today.  Currently well controlled with simply using diet and exercise.  No medications needed at this time. -Follow-up A1c in 3-6 months - foot exam performed today.  Essential hypertension He is not consistent with his antihypertensive regimen.  We discussed that many things can affect blood pressure.  He was encouraged to take his blood  pressure medication every day unless he was experiencing headaches, dizziness, lightheadedness.  He was also instructed on an appropriate protocol for at home blood pressure measurements. -Continue lisinopril 5 mg  Hyperlipidemia No blood work needed today. -Continue atorvastatin 40 mg   Eye itching Olopatadine drops refilled  He did have further concerns about his left shoulder discomfort which we were not able to fully evaluate today.  He was encouraged to return to clinic for further evaluation if he continued to experience discomfort.  He was also encouraged to use Voltaren gel in the meantime.  Matilde Haymaker, MD [place]

## 2021-01-20 NOTE — Patient Instructions (Addendum)
Diabetes -Her diabetes is well controlled today with an A1c of 6.4.  This is the prediabetic range.  We do not need to start any medications at this time.  Blood Pressure -take your lisinporil every day unless you experience dizziness or lightheadedness.  Cholesterol -continue taking your medication.  We do not need to recheck this lab right now.  Shoulder pain -I am sorry that we did not have time to talk about this today.  You can try to use topical Voltaren gel which can be helpful for muscular pain.  I recommend that you return to clinic for another visit if this is bothering you.  Please return to clinic in 6 months for your next checkup.   B?nh ti?u ???ng - B?nh ti?u ???ng c?a c ngy nay ???c ki?m sot t?t v?i A1c l 6,4. ?y l ph?m vi ti?n ti?u ???ng. Chng ti khng c?n b?t ??u s? d?ng b?t k? lo?i thu?c no vo lc ny.  Huy?t p - U?ng lisinporil m?i ngy tr? khi b?n b? chng m?t ho?c chong vng.  Cholesterol - ti?p t?c u?ng thu?c c?a b?n. Chng ti khng c?n ph?i ki?m tra l?i phng th nghi?m ny ngay by gi?.  ?au vai - Xin l?i v hm nay chng ta khng c th?i gian ?? ni chuy?n ny. B?n c th? th? s? d?ng gel Voltaren t?i ch? c th? h?u ch cho ch?ng ?au c?. Ti khuyn b?n nn quay l?i phng khm ?? th?m khm l?n n?a n?u ?i?u ny lm phi?n b?n.  Vui lng quay l?i phng khm sau 6 thng ?? ki?m tra s?c kh?e ti?p theo.

## 2021-01-20 NOTE — Assessment & Plan Note (Signed)
A1c under 7 today.  Currently well controlled with simply using diet and exercise.  No medications needed at this time. -Follow-up A1c in 3-6 months - foot exam performed today.

## 2021-01-20 NOTE — Assessment & Plan Note (Signed)
No blood work needed today. -Continue atorvastatin 40 mg

## 2021-01-28 ENCOUNTER — Telehealth: Payer: Self-pay | Admitting: Physician Assistant

## 2021-01-28 NOTE — Telephone Encounter (Signed)
Very difficult to communicate with over the phone due to language barrier.  I attempted to schedule an inperson visit for next week, but he is not able to come due to transportation. He tried [employer] operational officer and he feels this made his bloating worse and caused fevers.  He is having one small BM every few days.  He does not want to add Senokot as recommended in the last office note "she told me to stop it".  He has follow up with Dr. Henrene Pastor on 02/07/21.  Amy Esterwood PA do you want to prescribe to replace xifaxan until office visit with Dr. Henrene Pastor

## 2021-01-28 NOTE — Telephone Encounter (Signed)
Pt states that medication is not working and he cannot had a bm and is very bloating. He would like some advise.

## 2021-01-31 NOTE — Telephone Encounter (Signed)
I do not want to replace Xifaxan   I had told him ok to continue Senekot - needs to go back on senekot 2 daily at bedtime , and then he can wait to Dr Henrene Pastor

## 2021-02-01 NOTE — Telephone Encounter (Signed)
Patient notified

## 2021-02-07 ENCOUNTER — Ambulatory Visit: Payer: Medicare Other | Admitting: Internal Medicine

## 2021-02-08 DIAGNOSIS — R3915 Urgency of urination: Secondary | ICD-10-CM | POA: Diagnosis not present

## 2021-02-10 NOTE — Progress Notes (Addendum)
COVID Vaccine Completed: Date COVID Vaccine completed: Has received booster: COVID vaccine manufacturer: [place]  Date of COVID positive in last 90 days:  PCP - Gerlene Fee, DO      Cardiologist - Evalina Field, MD last office visit and cardiac clearance 11/23/20 in epic  Chest x-ray - greater than 1 year in epic EKG - 11/23/20 in epic Stress Test - 09/16/2020 in epic ECHO - 09/16/2020 in epic Cardiac Cath - greater than 2 years in epic Pacemaker/ICD device last checked:  Sleep Study -  CPAP -   Fasting Blood Sugar -  Checks Blood Sugar _____ times a day Hgb A1c 6.4 01/20/2021 in epic  Blood Thinner Instructions: Aspirin Instructions: Last Dose:  Activity level:  Unable to go up a flight of stairs without symptoms   Can go up a flight of stairs and activities of daily living without stopping and without symptoms   Able to exercise without symptoms     Anesthesia review: non-obstructive CAD, OSA, CKD,HTN, Dilated Aorta, right bundle branch block, first-degree block   Patient denies shortness of breath, fever, cough and chest pain at PAT appointment   Patient verbalized understanding of instructions that were given to them at the PAT appointment. Patient was also instructed that they will need to review over the PAT instructions again at home before surgery.

## 2021-02-16 ENCOUNTER — Other Ambulatory Visit: Payer: Self-pay

## 2021-02-16 ENCOUNTER — Encounter (HOSPITAL_COMMUNITY): Payer: Self-pay | Admitting: Urology

## 2021-02-16 NOTE — Anesthesia Preprocedure Evaluation (Addendum)
Anesthesia Evaluation  Patient identified by MRN, date of birth, ID band Patient awake    Reviewed: Allergy & Precautions, NPO status , Patient's Chart, lab work & pertinent test results  Airway Mallampati: II  TM Distance: >3 FB Neck ROM: Full    Dental no notable dental hx.    Pulmonary sleep apnea ,    Pulmonary exam normal breath sounds clear to auscultation       Cardiovascular hypertension, Pt. on medications negative cardio ROS Normal cardiovascular exam Rhythm:Regular Rate:Normal     Neuro/Psych  Headaches, Seizures -,  Anxiety negative psych ROS   GI/Hepatic Neg liver ROS, GERD  ,  Endo/Other  negative endocrine ROSdiabetes  Renal/GU negative Renal ROS  negative genitourinary   Musculoskeletal  (+) Arthritis , Osteoarthritis,    Abdominal   Peds negative pediatric ROS (+)  Hematology negative hematology ROS (+)   Anesthesia Other Findings   Reproductive/Obstetrics negative OB ROS                            Anesthesia Physical Anesthesia Plan  ASA: III  Anesthesia Plan: General   Post-op Pain Management:    Induction: Intravenous  PONV Risk Score and Plan: 2 and Ondansetron, Midazolam and Treatment may vary due to age or medical condition  Airway Management Planned: LMA  Additional Equipment:   Intra-op Plan:   Post-operative Plan: Extubation in OR  Informed Consent: I have reviewed the patients History and Physical, chart, labs and discussed the procedure including the risks, benefits and alternatives for the proposed anesthesia with the patient or authorized representative who has indicated his/her understanding and acceptance.     Dental advisory given  Plan Discussed with: CRNA  Anesthesia Plan Comments: (See APP note by Durel Salts, FNP   Echo:   1. Left ventricular ejection fraction, by estimation, is 65 to 70%. The  left ventricle has normal function.  The left ventricle has no regional  wall motion abnormalities. Left ventricular diastolic parameters are  consistent with Grade I diastolic  dysfunction (impaired relaxation).  2. Right ventricular systolic function is normal. The right ventricular  size is normal. There is normal pulmonary artery systolic pressure.  3. The mitral valve is grossly normal. No evidence of mitral valve  regurgitation. No evidence of mitral stenosis.  4. The aortic valve is normal in structure. Aortic valve regurgitation is  not visualized.  5. Aortic dilatation noted. There is mild dilatation of the ascending  aorta, measuring 45 mm. )      Anesthesia Quick Evaluation

## 2021-02-16 NOTE — Progress Notes (Addendum)
COVID Vaccine Completed:  x2 Date COVID Vaccine completed:  09-21-20, 10-19-20 Has received booster: COVID vaccine manufacturer: [employer]      Date of COVID positive in last 21 days:N/A  PCP - Gerlene Fee, DO Cardiologist - Evalina Field, [place] office visit and cardiac clearance 11/23/20 in epic  Chest x-ray - greater than 1 year in epic EKG - 11/23/20 in epic Stress Test - 09/16/2020 in epic ECHO - 09/16/2020 in epic Cardiac Cath - greater than 2 years in epic Pacemaker/ICD device last checked:  Sleep Study - +sleep apnea CPAP - No  Fasting Blood Sugar -  Checks Blood Sugar - Does not check, diet controlled  Hgb A1c 6.4 01/20/2021 in epic  Blood Thinner Instructions: N/A.  Patient states he only takes Tylenol, not ASA Aspirin Instructions: Last Dose:  Activity level:     Can go up a flight of stairs and activities of daily living without stopping and without symptoms.  Patient is alone during the day while family works and able to perform ADLs.                                           Anesthesia review: non-obstructive CAD, OSA, CKD,HTN, Dilated Aorta, right bundle branch block, first-degree block   Patient denies shortness of breath, fever, cough and chest pain at PAT appointment (Completed over the phone)   Patient verbalized understanding of instructions that were given to them at the PAT appointment. Patient was also instructed that they will need to review over the PAT instructions again at home before surgery.

## 2021-02-16 NOTE — Progress Notes (Signed)
Anesthesia Chart Review:  Pt is a same day work up    Case: [id number] Date/Time: 02/18/21 1215   Procedure: TRANSURETHRAL RESECTION OF THE PROSTATE (TURP), BIPOLAR (N/A )   Anesthesia type: General   Pre-op diagnosis: BENIGN PROSTATIC HYPERPLASIA WITH LOWER URINARY TRACT SYMPTOMS   Location: [facility]URE ROOM / [facility]   Surgeons: Ceasar Mons, MD      DISCUSSION: Pt is 80 years old with hx CAD (nonobstructive by 2010 cath), HTN, dilated aorta (55mm by 09/16/20 echo), OSA, CKD   Pt requires [place] translator   PROVIDERS: - PCP is Autry-Lott, Naaman Plummer, DO - Cardiologist is Eleonore Chiquito, MD. Arletha Pili pt for surgery at last office visit 11/23/20   LABS: Will be obtained day of surgery    EKG 11/23/20: sinus bradycardia. 1st degree AV block. RBBB.    CV: Nuclear stress test 09/16/20:   The left ventricular ejection fraction is hyperdynamic (>65%).  Nuclear stress EF: 73%.  The study is normal.  This is a low risk study.  There was no ST segment deviation noted during stress.  No T wave inversion was noted during stress.   Echo 09/16/20:  1. Left ventricular ejection fraction, by estimation, is 65 to 70%. The left ventricle has normal function. The left ventricle has no regional wall motion abnormalities. Left ventricular diastolic parameters are consistent with Grade I diastolic dysfunction (impaired relaxation).  2. Right ventricular systolic function is normal. The right ventricular size is normal. There is normal pulmonary artery systolic pressure.  3. The mitral valve is grossly normal. No evidence of mitral valve regurgitation. No evidence of mitral stenosis.  4. The aortic valve is normal in structure. Aortic valve regurgitation is not visualized.  5. Aortic dilatation noted. There is mild dilatation of the ascending aorta, measuring 45 mm.    Cardiac cath 06/04/09:  1. Mild to moderate CAD without hemodynamic significant lesion (50% mLAD, 40%  mRCA) 2. Normal LV function 3. No significant AV gradiient   Past Medical History:  Diagnosis Date  . Allergy   . Anal fissure   . Anemia   . Anxiety   . Arthritis    all over body   . Benign prostatic hyperplasia   . Bilateral lower extremity edema 03/22/2018  . Cataract    removed bilat   . Chest pain 02/2018  . Chronic kidney disease   . Chronic pain   . Colon polyp   . GERD (gastroesophageal reflux disease)   . Headache(784.0)   . Hemorrhoids   . Hyperlipidemia    Hx: of  . Hypertension   . IBS (irritable bowel syndrome)   . Incontinent of urine    Hx:of  . Migraines   . Neuromuscular disorder ([place])    periferal neuropathy per patient  . New onset right bundle branch block (RBBB)   . OSA (obstructive sleep apnea) 11/2009   sleep study with mild OSA, pt unwilling to use CPAP  . Seizures ([place])    pt states he has never had a seizure   . Sleep apnea    mild     Past Surgical History:  Procedure Laterality Date  . 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 CATARACT EXTRACTION     bilat  . CHOLECYSTECTOMY  2008  . COLONOSCOPY  2012   Dr Collene Mares  . HEMORRHOID SURGERY     anal surgery  . INGUINAL HERNIA REPAIR Bilateral 03/11/2013  Procedure: LAPAROSCOPIC BILATERAL INGUINAL HERNIA REPAIR;  Surgeon: Ralene Ok, MD;  Location: [facility];  Service: General;  Laterality: Bilateral;  . INSERTION OF MESH Bilateral 03/11/2013   Procedure: INSERTION OF MESH;  Surgeon: Ralene Ok, MD;  Location: [facility];  Service: General;  Laterality: Bilateral;  . NM MYOCAR PERF WALL MOTION  05/27/2009   mild iscemia mid inferio & apical regions.  . NM MYOCAR PERF WALL MOTION  11/01/2012   negative for ischemia  . POLYPECTOMY    . US ECHOCARDIOGRAPHY  05/27/2009   mild mitral annular ca+,AOV mildly sclerotic    MEDICATIONS: No current facility-administered medications for this encounter.   Marland Kitchen acetaminophen (TYLENOL) 500 MG  tablet  . alfuzosin (UROXATRAL) 10 MG 24 hr tablet  . atorvastatin (LIPITOR) 40 MG tablet  . carbamide peroxide (DEBROX) 6.5 % OTIC solution  . cetirizine (ZYRTEC) 10 MG tablet  . fluticasone (FLONASE) 50 MCG/ACT nasal spray  . lisinopril (ZESTRIL) 5 MG tablet  . olopatadine (PATANOL) 0.1 % ophthalmic solution  . sennosides-docusate sodium (SENOKOT-S) 8.6-50 MG tablet  . tamsulosin (FLOMAX) 0.4 MG CAPS capsule  . torsemide (DEMADEX) 20 MG tablet  . acetaminophen (TYLENOL) 325 MG tablet  . diclofenac Sodium (VOLTAREN) 1 % GEL  . esomeprazole (NEXIUM) 40 MG capsule  . lubiprostone (AMITIZA) 24 MCG capsule    If labs acceptable day of surgery, I anticipate pt can proceed with surgery as scheduled.  Willeen Cass, PhD, FNP-BC [facility]/Anesthesiology Phone: [phone] 02/16/2021 11:48 AM

## 2021-02-18 ENCOUNTER — Encounter (HOSPITAL_COMMUNITY)
Admission: RE | Disposition: A | Discharge status: Home / Self Care | Payer: Self-pay | Source: Home / Self Care | Attending: Urology

## 2021-02-18 ENCOUNTER — Observation Stay (HOSPITAL_COMMUNITY)
Admission: RE | Admit: 2021-02-18 | Discharge: 2021-02-19 | Disposition: A | Discharge status: Home / Self Care | Payer: Medicare Other | Attending: Urology | Admitting: Urology

## 2021-02-18 ENCOUNTER — Ambulatory Visit (HOSPITAL_COMMUNITY): Payer: Medicare Other | Admitting: Emergency Medicine

## 2021-02-18 ENCOUNTER — Encounter (HOSPITAL_COMMUNITY): Payer: Self-pay | Admitting: Urology

## 2021-02-18 DIAGNOSIS — I129 Hypertensive chronic kidney disease with stage 1 through stage 4 chronic kidney disease, or unspecified chronic kidney disease: Secondary | ICD-10-CM | POA: Diagnosis not present

## 2021-02-18 DIAGNOSIS — N189 Chronic kidney disease, unspecified: Secondary | ICD-10-CM | POA: Insufficient documentation

## 2021-02-18 DIAGNOSIS — N401 Enlarged prostate with lower urinary tract symptoms: Principal | ICD-10-CM | POA: Insufficient documentation

## 2021-02-18 DIAGNOSIS — N138 Other obstructive and reflux uropathy: Secondary | ICD-10-CM | POA: Insufficient documentation

## 2021-02-18 DIAGNOSIS — N320 Bladder-neck obstruction: Secondary | ICD-10-CM | POA: Diagnosis not present

## 2021-02-18 DIAGNOSIS — N400 Enlarged prostate without lower urinary tract symptoms: Secondary | ICD-10-CM | POA: Diagnosis present

## 2021-02-18 DIAGNOSIS — Z20822 Contact with and (suspected) exposure to covid-19: Secondary | ICD-10-CM | POA: Diagnosis not present

## 2021-02-18 DIAGNOSIS — E785 Hyperlipidemia, unspecified: Secondary | ICD-10-CM | POA: Diagnosis not present

## 2021-02-18 DIAGNOSIS — I4510 Unspecified right bundle-branch block: Secondary | ICD-10-CM | POA: Diagnosis not present

## 2021-02-18 HISTORY — DX: Type 2 diabetes mellitus without complications: E11.9

## 2021-02-18 HISTORY — PX: TRANSURETHRAL RESECTION OF PROSTATE: SHX73

## 2021-02-18 LAB — SARS CORONAVIRUS 2 BY RT PCR (HOSPITAL ORDER, PERFORMED IN ~~LOC~~ HOSPITAL LAB): SARS Coronavirus 2: NEGATIVE

## 2021-02-18 LAB — CBC
HCT: 38.1 % — ABNORMAL LOW (ref 39.0–52.0)
Hemoglobin: 12.4 g/dL — ABNORMAL LOW (ref 13.0–17.0)
MCH: 27.3 pg (ref 26.0–34.0)
MCHC: 32.5 g/dL (ref 30.0–36.0)
MCV: 83.7 fL (ref 80.0–100.0)
Platelets: 213 10*3/uL (ref 150–400)
RBC: 4.55 MIL/uL (ref 4.22–5.81)
RDW: 14.1 % (ref 11.5–15.5)
WBC: 5.7 10*3/uL (ref 4.0–10.5)
nRBC: 0 % (ref 0.0–0.2)

## 2021-02-18 LAB — BASIC METABOLIC PANEL
Anion gap: 9 (ref 5–15)
BUN: 22 mg/dL (ref 8–23)
CO2: 24 mmol/L (ref 22–32)
Calcium: 9.1 mg/dL (ref 8.9–10.3)
Chloride: 110 mmol/L (ref 98–111)
Creatinine, Ser: 1.32 mg/dL — ABNORMAL HIGH (ref 0.61–1.24)
GFR, Estimated: 54 mL/min — ABNORMAL LOW (ref >60)
Glucose, Bld: 107 mg/dL — ABNORMAL HIGH (ref 70–99)
Potassium: 3.5 mmol/L (ref 3.5–5.1)
Sodium: 143 mmol/L (ref 135–145)

## 2021-02-18 LAB — GLUCOSE, CAPILLARY
Glucose-Capillary: 101 mg/dL — ABNORMAL HIGH (ref 70–99)
Glucose-Capillary: 102 mg/dL — ABNORMAL HIGH (ref 70–99)

## 2021-02-18 SURGERY — TURP (TRANSURETHRAL RESECTION OF PROSTATE)
Anesthesia: General | Site: Urethra

## 2021-02-18 MED ORDER — FENTANYL CITRATE (PF) 100 MCG/2ML IJ SOLN
INTRAMUSCULAR | Status: AC
  Filled 2021-02-18: qty 2

## 2021-02-18 MED ORDER — DEXAMETHASONE SODIUM PHOSPHATE 10 MG/ML IJ SOLN
INTRAMUSCULAR | Status: DC | PRN
  Administered 2021-02-18: 4 mg via INTRAVENOUS

## 2021-02-18 MED ORDER — AMISULPRIDE (ANTIEMETIC) 5 MG/2ML IV SOLN: 10.0000 mg | Freq: Once | INTRAVENOUS | Status: DC | PRN

## 2021-02-18 MED ORDER — OXYBUTYNIN CHLORIDE 5 MG PO TABS: 5.0000 mg | ORAL_TABLET | Freq: Three times a day (TID) | ORAL | 1 refills | Status: DC | PRN

## 2021-02-18 MED ORDER — EPHEDRINE SULFATE-NACL 50-0.9 MG/10ML-% IV SOSY
PREFILLED_SYRINGE | INTRAVENOUS | Status: DC | PRN
  Administered 2021-02-18: 15 mg via INTRAVENOUS

## 2021-02-18 MED ORDER — SODIUM CHLORIDE 0.9 % IR SOLN
3000.0000 mL | Status: DC
  Administered 2021-02-18 – 2021-02-19 (×9): 3000 mL

## 2021-02-18 MED ORDER — CHLORHEXIDINE GLUCONATE CLOTH 2 % EX PADS: 6.0000 | MEDICATED_PAD | Freq: Every day | CUTANEOUS | Status: DC

## 2021-02-18 MED ORDER — PROPOFOL 10 MG/ML IV BOLUS
INTRAVENOUS | Status: AC
  Filled 2021-02-18: qty 20

## 2021-02-18 MED ORDER — LIDOCAINE 2% (20 MG/ML) 5 ML SYRINGE
INTRAMUSCULAR | Status: DC | PRN
  Administered 2021-02-18: 60 mg via INTRAVENOUS

## 2021-02-18 MED ORDER — ACETAMINOPHEN 325 MG PO TABS
650.0000 mg | ORAL_TABLET | ORAL | Status: DC | PRN
  Administered 2021-02-19: 650 mg via ORAL
  Filled 2021-02-18: qty 2

## 2021-02-18 MED ORDER — DEXAMETHASONE SODIUM PHOSPHATE 10 MG/ML IJ SOLN
INTRAMUSCULAR | Status: AC
  Filled 2021-02-18: qty 1

## 2021-02-18 MED ORDER — BELLADONNA ALKALOIDS-OPIUM 16.2-60 MG RE SUPP
1.0000 | Freq: Four times a day (QID) | RECTAL | Status: DC | PRN
  Administered 2021-02-19: 1 via RECTAL
  Filled 2021-02-18: qty 1

## 2021-02-18 MED ORDER — HYDROMORPHONE HCL 1 MG/ML IJ SOLN
0.2500 mg | INTRAMUSCULAR | Status: DC | PRN
  Administered 2021-02-18 (×2): 0.5 mg via INTRAVENOUS

## 2021-02-18 MED ORDER — OXYBUTYNIN CHLORIDE 5 MG PO TABS
5.0000 mg | ORAL_TABLET | Freq: Three times a day (TID) | ORAL | Status: DC | PRN
  Administered 2021-02-18 – 2021-02-19 (×2): 5 mg via ORAL
  Filled 2021-02-18 (×2): qty 1

## 2021-02-18 MED ORDER — TRAMADOL HCL 50 MG PO TABS: 50.0000 mg | ORAL_TABLET | Freq: Four times a day (QID) | ORAL | 0 refills | Status: AC | PRN

## 2021-02-18 MED ORDER — SODIUM CHLORIDE 0.9 % IV SOLN: INTRAVENOUS | Status: DC

## 2021-02-18 MED ORDER — LIDOCAINE 2% (20 MG/ML) 5 ML SYRINGE
INTRAMUSCULAR | Status: AC
  Filled 2021-02-18: qty 5

## 2021-02-18 MED ORDER — TORSEMIDE 20 MG PO TABS
20.0000 mg | ORAL_TABLET | Freq: Every day | ORAL | Status: DC
  Administered 2021-02-18 – 2021-02-19 (×2): 20 mg via ORAL
  Filled 2021-02-18 (×3): qty 1

## 2021-02-18 MED ORDER — HYDROCODONE-ACETAMINOPHEN 5-325 MG PO TABS
1.0000 | ORAL_TABLET | ORAL | Status: DC | PRN
  Administered 2021-02-18 – 2021-02-19 (×4): 1 via ORAL
  Filled 2021-02-18 (×4): qty 1

## 2021-02-18 MED ORDER — OXYCODONE HCL 5 MG/5ML PO SOLN: 5.0000 mg | Freq: Once | ORAL | Status: DC | PRN

## 2021-02-18 MED ORDER — DIPHENHYDRAMINE HCL 12.5 MG/5ML PO ELIX
12.5000 mg | ORAL_SOLUTION | Freq: Four times a day (QID) | ORAL | Status: DC | PRN
  Administered 2021-02-19: 12.5 mg via ORAL
  Filled 2021-02-18: qty 5

## 2021-02-18 MED ORDER — SODIUM CHLORIDE 0.9 % IR SOLN
Status: DC | PRN
  Administered 2021-02-18: 3000 mL
  Administered 2021-02-18 (×2): 6000 mL

## 2021-02-18 MED ORDER — LISINOPRIL 5 MG PO TABS
5.0000 mg | ORAL_TABLET | Freq: Every day | ORAL | Status: DC
  Administered 2021-02-18: 5 mg via ORAL
  Filled 2021-02-18: qty 1

## 2021-02-18 MED ORDER — CEPHALEXIN 500 MG PO CAPS: 500.0000 mg | ORAL_CAPSULE | Freq: Two times a day (BID) | ORAL | 0 refills | Status: AC

## 2021-02-18 MED ORDER — ONDANSETRON HCL 4 MG/2ML IJ SOLN
INTRAMUSCULAR | Status: DC | PRN
  Administered 2021-02-18: 4 mg via INTRAVENOUS

## 2021-02-18 MED ORDER — OXYCODONE HCL 5 MG PO TABS
5.0000 mg | ORAL_TABLET | Freq: Once | ORAL | Status: DC | PRN
Start: 2021-02-18 — End: 2021-02-18

## 2021-02-18 MED ORDER — CHLORHEXIDINE GLUCONATE 0.12 % MT SOLN
15.0000 mL | Freq: Once | OROMUCOSAL | Status: AC
  Administered 2021-02-18: 15 mL via OROMUCOSAL

## 2021-02-18 MED ORDER — MORPHINE SULFATE (PF) 2 MG/ML IV SOLN: 2.0000 mg | INTRAVENOUS | Status: DC | PRN

## 2021-02-18 MED ORDER — CEFAZOLIN SODIUM-DEXTROSE 2-4 GM/100ML-% IV SOLN
2.0000 g | Freq: Once | INTRAVENOUS | Status: AC
  Administered 2021-02-18: 2 g via INTRAVENOUS
  Filled 2021-02-18: qty 100

## 2021-02-18 MED ORDER — SENNOSIDES-DOCUSATE SODIUM 8.6-50 MG PO TABS
2.0000 | ORAL_TABLET | Freq: Two times a day (BID) | ORAL | Status: DC
  Administered 2021-02-18 – 2021-02-19 (×2): 2 via ORAL
  Filled 2021-02-18 (×3): qty 2

## 2021-02-18 MED ORDER — ONDANSETRON HCL 4 MG/2ML IJ SOLN
4.0000 mg | INTRAMUSCULAR | Status: DC | PRN
  Administered 2021-02-18: 4 mg via INTRAVENOUS
  Filled 2021-02-18: qty 2

## 2021-02-18 MED ORDER — HYDROMORPHONE HCL 1 MG/ML IJ SOLN
INTRAMUSCULAR | Status: AC
  Filled 2021-02-18: qty 1

## 2021-02-18 MED ORDER — FENTANYL CITRATE (PF) 100 MCG/2ML IJ SOLN
INTRAMUSCULAR | Status: DC | PRN
  Administered 2021-02-18 (×2): 25 ug via INTRAVENOUS

## 2021-02-18 MED ORDER — ORAL CARE MOUTH RINSE: 15.0000 mL | Freq: Once | OROMUCOSAL | Status: AC

## 2021-02-18 MED ORDER — ONDANSETRON HCL 4 MG/2ML IJ SOLN
INTRAMUSCULAR | Status: AC
  Filled 2021-02-18: qty 2

## 2021-02-18 MED ORDER — PROMETHAZINE HCL 25 MG/ML IJ SOLN: 6.2500 mg | INTRAMUSCULAR | Status: DC | PRN

## 2021-02-18 MED ORDER — PROPOFOL 10 MG/ML IV BOLUS
INTRAVENOUS | Status: DC | PRN
  Administered 2021-02-18: 160 mg via INTRAVENOUS

## 2021-02-18 MED ORDER — SODIUM CHLORIDE 0.9 % IV SOLN
1.0000 g | Freq: Three times a day (TID) | INTRAVENOUS | Status: AC
  Administered 2021-02-18 – 2021-02-19 (×3): 1 g via INTRAVENOUS
  Filled 2021-02-18: qty 1
  Filled 2021-02-18: qty 10
  Filled 2021-02-18: qty 1

## 2021-02-18 MED ORDER — DIPHENHYDRAMINE HCL 50 MG/ML IJ SOLN: 12.5000 mg | Freq: Four times a day (QID) | INTRAMUSCULAR | Status: DC | PRN

## 2021-02-18 MED ORDER — CEFAZOLIN SODIUM-DEXTROSE 1-4 GM/50ML-% IV SOLN
1.0000 g | Freq: Three times a day (TID) | INTRAVENOUS | Status: DC
Start: 2021-02-18 — End: 2021-02-18
  Filled 2021-02-18: qty 50

## 2021-02-18 MED ORDER — ATORVASTATIN CALCIUM 40 MG PO TABS
40.0000 mg | ORAL_TABLET | Freq: Every day | ORAL | Status: DC
  Administered 2021-02-19: 40 mg via ORAL
  Filled 2021-02-18: qty 1

## 2021-02-18 MED ORDER — LACTATED RINGERS IV SOLN: INTRAVENOUS | Status: DC

## 2021-02-18 SURGICAL SUPPLY — 16 items
BAG URINE DRAIN 2000ML AR STRL (UROLOGICAL SUPPLIES) ×2 IMPLANT
BAG URO CATCHER STRL LF (MISCELLANEOUS) ×2 IMPLANT
CATH FOLEY 3WAY 30CC 22FR (CATHETERS) ×2 IMPLANT
DRAPE FOOT SWITCH (DRAPES) ×2 IMPLANT
GLOVE SURG ENC TEXT LTX SZ7.5 (GLOVE) ×2 IMPLANT
GOWN STRL REUS W/TWL XL LVL3 (GOWN DISPOSABLE) ×2 IMPLANT
HOLDER FOLEY CATH W/STRAP (MISCELLANEOUS) ×2 IMPLANT
KIT TURNOVER KIT A (KITS) ×2 IMPLANT
LOOP CUT BIPOLAR 24F LRG (ELECTROSURGICAL) ×2 IMPLANT
MANIFOLD NEPTUNE II (INSTRUMENTS) ×2 IMPLANT
PACK CYSTO (CUSTOM PROCEDURE TRAY) ×2 IMPLANT
PENCIL SMOKE EVACUATOR (MISCELLANEOUS) IMPLANT
SYR TOOMEY IRRIG 70ML (MISCELLANEOUS) ×2
SYRINGE TOOMEY IRRIG 70ML (MISCELLANEOUS) ×1 IMPLANT
TUBING CONNECTING 10 (TUBING) ×2 IMPLANT
TUBING UROLOGY SET (TUBING) ×2 IMPLANT

## 2021-02-18 NOTE — Op Note (Signed)
  Operative Note  Preoperative diagnosis:  1.  BPH with bladder outlet obstruction  Postoperative diagnosis: 1.  BPH with bladder outlet obstruction  Procedure(s): 1.  Bipolar TURP  Surgeon: Ellison Hughs, MD  Assistants:  None  Anesthesia:  General  Complications:  None  EBL:  20 mL  Specimens: 1. Prostate chips  Drains/Catheters: 1.  64F 3-way Foley catheter with 20 mL  Intraoperative findings:   1. Trilobar prostatic urethral obstruction  Indication:  Omar Sanders is a 80 y.o. male with BPH with LUTS refractory to alfuzosin and finasteride.  He has been consented for the above procedures, voices understanding and wishes to proceed.  Description of procedure:  After informed consent was obtained, the patient was brought to the operating room and general anesthesia was administered. The patient was then placed in the dorsolithotomy position and prepped and draped in usual sterile fashion. A timeout was performed. A 23 French rigid cystoscope was then inserted into the urethral meatus and advanced into the bladder under direct vision. A complete bladder survey revealed no intravesical pathology.  Both ureteral orifices were identified and well away from the bladder neck.  The rigid cystoscope was then exchanged for a 26 French resectoscope with a bipolar loop working element.  Starting at the bladder neck and progressing distally to the verumontanum, the prostatic adenoma was systematically resected until a widely patent prostatic urethral channel was created.  All prostate chips were then hand irrigated out of the bladder and sent to pathology for permanent section.  The resectoscope was then removed and exchanged for a 22 French three-way Foley catheter.  The three-way Foley catheter was then extensively hand irrigated until the irrigant returned clear to light pink.  The catheter was then placed to continuous bladder irrigation and placed on rubber band traction.  He  tolerated the procedure well and was transferred to the postanesthesia unit in stable condition.  Plan:  CBI overnight

## 2021-02-18 NOTE — H&P (Signed)
Urology Preoperative H&P   Chief Complaint: Weak stream  History of Present Illness: Omar Sanders is a 80 y.o. male with a history of BPH with LUTS.  The patient has been on alfuzosin and finasteride for the past several years with progressively worsening lower urinary tract symptoms including a weak force of stream, urinary urgency and a sensation of incomplete bladder emptying.  His PVR's of been around 250 mL.  He underwent a cystoscopy in 2020 that revealed trilobar prostatic urethral obstruction with mild to moderate trabeculation.  He was also noted to have prostatomegaly on renal/bladder ultrasound from March 2021.  He notes that if he misses a dose of his BPH medications, he will develop lower extremity edema that quickly resolves once he resumes his medications.  He denies interval UTIs, dysuria or gross hematuria.    Past Medical History:  Diagnosis Date  . Allergy   . Anal fissure   . Anemia   . Anxiety   . Arthritis    all over body   . Benign prostatic hyperplasia   . Bilateral lower extremity edema 03/22/2018  . Cataract    removed bilat   . Chest pain 02/2018  . Chronic kidney disease   . Chronic pain   . Colon polyp   . GERD (gastroesophageal reflux disease)   . Headache(784.0)   . Hemorrhoids   . Hyperlipidemia    Hx: of  . Hypertension   . IBS (irritable bowel syndrome)   . Incontinent of urine    Hx:of  . Migraines   . Neuromuscular disorder ([place])    periferal neuropathy per patient  . New onset right bundle branch block (RBBB)   . OSA (obstructive sleep apnea) 11/2009   sleep study with mild OSA, pt unwilling to use CPAP  . Seizures ([place])    pt states he has never had a seizure   . Sleep apnea    mild     Past Surgical History:  Procedure Laterality Date  . 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 CATARACT EXTRACTION     bilat  . CHOLECYSTECTOMY  2008  . COLONOSCOPY  2012   Dr Collene Mares  .  HEMORRHOID SURGERY     anal surgery  . INGUINAL HERNIA REPAIR Bilateral 03/11/2013   Procedure: LAPAROSCOPIC BILATERAL INGUINAL HERNIA REPAIR;  Surgeon: Ralene Ok, MD;  Location: [place];  Service: General;  Laterality: Bilateral;  . INSERTION OF MESH Bilateral 03/11/2013   Procedure: INSERTION OF MESH;  Surgeon: Ralene Ok, MD;  Location: [place];  Service: General;  Laterality: Bilateral;  . NM MYOCAR PERF WALL MOTION  05/27/2009   mild iscemia mid inferio & apical regions.  . NM MYOCAR PERF WALL MOTION  11/01/2012   negative for ischemia  . POLYPECTOMY    . US ECHOCARDIOGRAPHY  05/27/2009   mild mitral annular ca+,AOV mildly sclerotic    Allergies:  Allergies  Allergen Reactions  . Pantoprazole Other (See Comments)    "Chest feels hot, stomach irritation"  . Aspirin Other (See Comments)    Abdominal pain   . Lubiprostone Other (See Comments)    Stomach irritation   . Vitamin C Other (See Comments)    Abdominal pain    Family History  Problem Relation Age of Onset  . Hypertension Mother   . Colon cancer Neg Hx   . Colon polyps Neg Hx   . Esophageal cancer Neg Hx   .  Rectal cancer Neg Hx   . Stomach cancer Neg Hx     Social History:  reports that he has never smoked. He has never used smokeless tobacco. He reports that he does not drink alcohol and does not use drugs.  ROS: A complete review of systems was performed.  All systems are negative except for pertinent findings as noted.  Physical Exam:  Vital signs in last 24 hours:   Constitutional:  Alert and oriented, No acute distress Cardiovascular: Regular rate and rhythm, No JVD Respiratory: Normal respiratory effort, Lungs clear bilaterally GI: Abdomen is soft, nontender, nondistended, no abdominal masses GU: No CVA tenderness Lymphatic: No lymphadenopathy Neurologic: Grossly intact, no focal deficits Psychiatric: Normal mood and affect  Laboratory Data:  No results for input(s): WBC, HGB, HCT, PLT in  the last 72 hours.  No results for input(s): NA, K, CL, GLUCOSE, BUN, CALCIUM, CREATININE in the last 72 hours.  Invalid input(s): CO3   No results found for this or any previous visit (from the past 24 hour(s)). No results found for this or any previous visit (from the past 240 hour(s)).  Renal Function: No results for input(s): CREATININE in the last 168 hours. CrCl cannot be calculated (Patient's most recent lab result is older than the maximum 21 days allowed.).  Radiologic Imaging: No results found.  I independently reviewed the above imaging studies.  Assessment and Plan Keithan ELVA BREAKER is a 80 y.o. male with BPH with LUTS refractory to alpha blockers and 5 alpha reductase inhibitors  .-The risks, benefits and alternatives of cystoscopy with TURP was discussed with the patient. The risks included, but are not limited to, bleeding, urinary tract infection, bladder perforation requiring prolonged catheterization and/or open bladder repair, ureteral injury, ureteral obstruction, urethral stricture disease, new or worsening voiding dysfunction, retrograde ejaculation, MI, CVA, PE, DVT and the inherent risks of general anesthesia. We also discussed the need for Foley catheterization for at least 3 days post-op and the likely need for post-op observation in the hospital following the procedure. The patient voices understanding and wishes to proceed.    Ellison Hughs, MD 02/18/2021, 9:42 AM  [facility] Pager: [phone]

## 2021-02-18 NOTE — Anesthesia Procedure Notes (Signed)
Procedure Name: LMA Insertion °Performed by: Hillarie Harrigan H, CRNA °Pre-anesthesia Checklist: Patient identified, Emergency Drugs available, Suction available and Patient being monitored °Patient Re-evaluated:Patient Re-evaluated prior to induction °Oxygen Delivery Method: Circle System Utilized °Preoxygenation: Pre-oxygenation with 100% oxygen °Induction Type: IV induction °Ventilation: Mask ventilation without difficulty °LMA: LMA inserted °LMA Size: 4.0 °Number of attempts: 1 °Airway Equipment and Method: Bite block °Placement Confirmation: positive ETCO2 °Tube secured with: Tape °Dental Injury: Teeth and Oropharynx as per pre-operative assessment  ° ° ° ° ° ° °

## 2021-02-18 NOTE — Transfer of Care (Signed)
Immediate Anesthesia Transfer of Care Note  Patient: Omar Sanders  Procedure(s) Performed: TRANSURETHRAL RESECTION OF THE PROSTATE (TURP), BIPOLAR (N/A Urethra)  Patient Location: PACU  Anesthesia Type:General  Level of Consciousness: drowsy  Airway & Oxygen Therapy: Patient connected to face mask oxygen  Post-op Assessment: Report given to RN and Post -op Vital signs reviewed and stable  Post vital signs: Reviewed and stable  Last Vitals:  Vitals Value Taken Time  BP 126/73 02/18/21 1420  Temp    Pulse 50 02/18/21 1422  Resp 13 02/18/21 1422  SpO2 100 % 02/18/21 1422  Vitals shown include unvalidated device data.  Last Pain:  Vitals:   02/18/21 1045  TempSrc:   PainSc: 6          Complications: No complications documented.

## 2021-02-18 NOTE — Plan of Care (Signed)
  Problem: Elimination: Goal: Will not experience complications related to bowel motility Outcome: Progressing Goal: Will not experience complications related to urinary retention Outcome: Progressing   Problem: Pain Managment: Goal: General experience of comfort will improve Outcome: Progressing   Problem: Safety: Goal: Ability to remain free from injury will improve Outcome: Progressing   Problem: Education: Goal: Knowledge of the prescribed therapeutic regimen will improve Outcome: Progressing

## 2021-02-19 DIAGNOSIS — N401 Enlarged prostate with lower urinary tract symptoms: Secondary | ICD-10-CM | POA: Diagnosis not present

## 2021-02-19 DIAGNOSIS — N138 Other obstructive and reflux uropathy: Secondary | ICD-10-CM | POA: Diagnosis not present

## 2021-02-19 DIAGNOSIS — N189 Chronic kidney disease, unspecified: Secondary | ICD-10-CM | POA: Diagnosis not present

## 2021-02-19 DIAGNOSIS — Z20822 Contact with and (suspected) exposure to covid-19: Secondary | ICD-10-CM | POA: Diagnosis not present

## 2021-02-19 DIAGNOSIS — I129 Hypertensive chronic kidney disease with stage 1 through stage 4 chronic kidney disease, or unspecified chronic kidney disease: Secondary | ICD-10-CM | POA: Diagnosis not present

## 2021-02-19 LAB — CBC
HCT: 36.6 % — ABNORMAL LOW (ref 39.0–52.0)
Hemoglobin: 11.7 g/dL — ABNORMAL LOW (ref 13.0–17.0)
MCH: 26.7 pg (ref 26.0–34.0)
MCHC: 32 g/dL (ref 30.0–36.0)
MCV: 83.6 fL (ref 80.0–100.0)
Platelets: 201 K/uL (ref 150–400)
RBC: 4.38 MIL/uL (ref 4.22–5.81)
RDW: 14.1 % (ref 11.5–15.5)
WBC: 7.8 K/uL (ref 4.0–10.5)
nRBC: 0 % (ref 0.0–0.2)

## 2021-02-19 MED ORDER — POLYVINYL ALCOHOL 1.4 % OP SOLN
1.0000 [drp] | OPHTHALMIC | Status: DC | PRN
  Filled 2021-02-19 (×2): qty 15

## 2021-02-19 NOTE — Plan of Care (Signed)
  Problem: Education: Goal: Knowledge of General Education information will improve Description: Including pain rating scale, medication(s)/side effects and non-pharmacologic comfort measures Outcome: Adequate for Discharge   Problem: Health Behavior/Discharge Planning: Goal: Ability to manage health-related needs will improve 02/19/2021 1659 by Zadie Rhine, RN Outcome: Adequate for Discharge 02/19/2021 1659 by Zadie Rhine, RN Outcome: Progressing   Problem: Clinical Measurements: Goal: Ability to maintain clinical measurements within normal limits will improve 02/19/2021 1659 by Zadie Rhine, RN Outcome: Adequate for Discharge 02/19/2021 1659 by Zadie Rhine, RN Outcome: Progressing Goal: Will remain free from infection 02/19/2021 1659 by Zadie Rhine, RN Outcome: Adequate for Discharge 02/19/2021 1659 by Zadie Rhine, RN Outcome: Progressing Goal: Diagnostic test results will improve Outcome: Adequate for Discharge Goal: Respiratory complications will improve Outcome: Adequate for Discharge Goal: Cardiovascular complication will be avoided Outcome: Adequate for Discharge   Problem: Activity: Goal: Risk for activity intolerance will decrease Outcome: Adequate for Discharge   Problem: Nutrition: Goal: Adequate nutrition will be maintained Outcome: Adequate for Discharge   Problem: Coping: Goal: Level of anxiety will decrease Outcome: Adequate for Discharge   Problem: Elimination: Goal: Will not experience complications related to bowel motility Outcome: Adequate for Discharge Goal: Will not experience complications related to urinary retention Outcome: Adequate for Discharge   Problem: Pain Managment: Goal: General experience of comfort will improve Outcome: Adequate for Discharge   Problem: Safety: Goal: Ability to remain free from injury will improve Outcome: Adequate for Discharge   Problem: Skin Integrity: Goal: Risk for impaired  skin integrity will decrease Outcome: Adequate for Discharge   Problem: Education: Goal: Knowledge of the prescribed therapeutic regimen will improve Outcome: Adequate for Discharge   Problem: Bowel/Gastric: Goal: Gastrointestinal status for postoperative course will improve Outcome: Adequate for Discharge   Problem: Health Behavior/Discharge Planning: Goal: Identification of resources available to assist in meeting health care needs will improve Outcome: Adequate for Discharge   Problem: Skin Integrity: Goal: Demonstration of wound healing without infection will improve Outcome: Adequate for Discharge   Problem: Urinary Elimination: Goal: Ability to avoid or minimize complications of infection will improve Outcome: Adequate for Discharge

## 2021-02-19 NOTE — Progress Notes (Signed)
Pt discharged to home instructions reviewed with patient and family spent 2 hrs with pt and son, reviewing and discussing home care concerning pt. Pt very anxious about going home with cath and understanding the care of cath and emptying of cath. Pt and sone completed return demonstration, however pt is overwhelmed with the entire processes. Questions addressed, MD called and followed up on requested questions. Will cont to monitor and assist pt and son. SRP, RN

## 2021-02-19 NOTE — Plan of Care (Signed)
Problem: Education: Goal: Knowledge of General Education information will improve Description: Including pain rating scale, medication(s)/side effects and non-pharmacologic comfort measures 02/19/2021 1703 by Zadie Rhine, RN Outcome: Adequate for Discharge 02/19/2021 1659 by Zadie Rhine, RN Outcome: Adequate for Discharge   Problem: Health Behavior/Discharge Planning: Goal: Ability to manage health-related needs will improve 02/19/2021 1703 by Zadie Rhine, RN Outcome: Adequate for Discharge 02/19/2021 1659 by Zadie Rhine, RN Outcome: Adequate for Discharge 02/19/2021 1659 by Zadie Rhine, RN Outcome: Progressing   Problem: Clinical Measurements: Goal: Ability to maintain clinical measurements within normal limits will improve 02/19/2021 1703 by Zadie Rhine, RN Outcome: Adequate for Discharge 02/19/2021 1659 by Zadie Rhine, RN Outcome: Adequate for Discharge 02/19/2021 1659 by Zadie Rhine, RN Outcome: Progressing Goal: Will remain free from infection 02/19/2021 1703 by Zadie Rhine, RN Outcome: Adequate for Discharge 02/19/2021 1659 by Zadie Rhine, RN Outcome: Adequate for Discharge 02/19/2021 1659 by Zadie Rhine, RN Outcome: Progressing Goal: Diagnostic test results will improve 02/19/2021 1703 by Zadie Rhine, RN Outcome: Adequate for Discharge 02/19/2021 1659 by Zadie Rhine, RN Outcome: Adequate for Discharge Goal: Respiratory complications will improve 02/19/2021 1703 by Zadie Rhine, RN Outcome: Adequate for Discharge 02/19/2021 1659 by Zadie Rhine, RN Outcome: Adequate for Discharge Goal: Cardiovascular complication will be avoided 02/19/2021 1703 by Zadie Rhine, RN Outcome: Adequate for Discharge 02/19/2021 1659 by Zadie Rhine, RN Outcome: Adequate for Discharge   Problem: Activity: Goal: Risk for activity intolerance will decrease 02/19/2021 1703 by Zadie Rhine,  RN Outcome: Adequate for Discharge 02/19/2021 1659 by Zadie Rhine, RN Outcome: Adequate for Discharge   Problem: Nutrition: Goal: Adequate nutrition will be maintained 02/19/2021 1703 by Zadie Rhine, RN Outcome: Adequate for Discharge 02/19/2021 1659 by Zadie Rhine, RN Outcome: Adequate for Discharge   Problem: Coping: Goal: Level of anxiety will decrease 02/19/2021 1703 by Zadie Rhine, RN Outcome: Adequate for Discharge 02/19/2021 1659 by Zadie Rhine, RN Outcome: Adequate for Discharge   Problem: Elimination: Goal: Will not experience complications related to bowel motility 02/19/2021 1703 by Zadie Rhine, RN Outcome: Adequate for Discharge 02/19/2021 1659 by Zadie Rhine, RN Outcome: Adequate for Discharge Goal: Will not experience complications related to urinary retention 02/19/2021 1703 by Zadie Rhine, RN Outcome: Adequate for Discharge 02/19/2021 1659 by Zadie Rhine, RN Outcome: Adequate for Discharge   Problem: Pain Managment: Goal: General experience of comfort will improve 02/19/2021 1703 by Zadie Rhine, RN Outcome: Adequate for Discharge 02/19/2021 1659 by Zadie Rhine, RN Outcome: Adequate for Discharge   Problem: Safety: Goal: Ability to remain free from injury will improve 02/19/2021 1703 by Zadie Rhine, RN Outcome: Adequate for Discharge 02/19/2021 1659 by Zadie Rhine, RN Outcome: Adequate for Discharge   Problem: Skin Integrity: Goal: Risk for impaired skin integrity will decrease 02/19/2021 1703 by Zadie Rhine, RN Outcome: Adequate for Discharge 02/19/2021 1659 by Zadie Rhine, RN Outcome: Adequate for Discharge   Problem: Education: Goal: Knowledge of the prescribed therapeutic regimen will improve 02/19/2021 1703 by Zadie Rhine, RN Outcome: Adequate for Discharge 02/19/2021 1659 by Zadie Rhine, RN Outcome: Adequate for Discharge   Problem: Bowel/Gastric: Goal:  Gastrointestinal status for postoperative course will improve 02/19/2021 1703 by Zadie Rhine, RN Outcome: Adequate for Discharge 02/19/2021 1659 by Zadie Rhine, RN Outcome: Adequate for Discharge   Problem: Health Behavior/Discharge Planning: Goal:  Identification of resources available to assist in meeting health care needs will improve 02/19/2021 1703 by Zadie Rhine, RN Outcome: Adequate for Discharge 02/19/2021 1659 by Zadie Rhine, RN Outcome: Adequate for Discharge   Problem: Skin Integrity: Goal: Demonstration of wound healing without infection will improve 02/19/2021 1703 by Zadie Rhine, RN Outcome: Adequate for Discharge 02/19/2021 1659 by Zadie Rhine, RN Outcome: Adequate for Discharge   Problem: Urinary Elimination: Goal: Ability to avoid or minimize complications of infection will improve 02/19/2021 1703 by Zadie Rhine, RN Outcome: Adequate for Discharge 02/19/2021 1659 by Zadie Rhine, RN Outcome: Adequate for Discharge

## 2021-02-19 NOTE — Discharge Instructions (Signed)

## 2021-02-19 NOTE — Progress Notes (Signed)
C/o of dry eyes, artificial tears ordered. SRP, RN

## 2021-02-19 NOTE — Progress Notes (Signed)
Pt will discharge with cath in place, CBI discontinued as ordered and will follow up  With appointment as noted. SP, RN

## 2021-02-19 NOTE — Plan of Care (Signed)
  Problem: Health Behavior/Discharge Planning: Goal: Ability to manage health-related needs will improve Outcome: Progressing   Problem: Clinical Measurements: Goal: Ability to maintain clinical measurements within normal limits will improve Outcome: Progressing Goal: Will remain free from infection Outcome: Progressing   

## 2021-02-19 NOTE — Discharge Summary (Signed)
Physician Discharge Summary  Patient ID: Omar Sanders MRN: [id number] DOB/AGE: [birth year] 80 y.o.  Admit date: 02/18/2021 Discharge date: 02/19/2021  Admission Diagnoses:  Discharge Diagnoses:  Active Problems:   BPH with obstruction/lower urinary tract symptoms   Discharged Condition: good  Hospital Course: Patient underwent TURP.  He remained overnight on continuous bladder irrigation.  The following day, urine was clear to light pink off CBI.  He was discharged with Foley catheter.  Consults: None  Significant Diagnostic Studies: None  Treatments: surgery: As above  Discharge Exam: Blood pressure 111/66, pulse (!) 58, temperature 97.9 F (36.6 C), temperature source Oral, resp. rate 18, height 5\' 7"  (1.702 m), weight 84 kg, SpO2 98 %. General appearance: alert no acute distress Adequate perfusion of extremities Nonlabored respiration Foley catheter draining clear to light pink off CBI  Disposition: Discharge disposition: 01-Home or Self Care        Allergies as of 02/19/2021      Reactions   Pantoprazole Other (See Comments)   "Chest feels hot, stomach irritation"   Aspirin Other (See Comments)   Abdominal pain   Lubiprostone Other (See Comments)   Stomach irritation    Vitamin C Other (See Comments)   Abdominal pain      Medication List    TAKE these medications   acetaminophen 500 MG tablet Commonly known as: TYLENOL Take 1,000 mg by mouth in the morning, at noon, and at bedtime.   acetaminophen 325 MG tablet Commonly known as: TYLENOL Take 2 tablets (650 mg total) by mouth every 6 (six) hours as needed for mild pain (or Fever >/= 101).   alfuzosin 10 MG 24 hr tablet Commonly known as: UROXATRAL Take 10 mg by mouth daily.   atorvastatin 40 MG tablet Commonly known as: LIPITOR Take 1 tablet (40 mg total) by mouth daily.   carbamide peroxide 6.5 % OTIC solution Commonly known as: DEBROX Place 5 drops into both ears 2 (two) times daily as needed  (ear wax).   cephALEXin 500 MG capsule Commonly known as: KEFLEX Take 1 capsule (500 mg total) by mouth 2 (two) times daily for 3 days. Start taking on 02/22/21   cetirizine 10 MG tablet Commonly known as: ZYRTEC Take 1 tablet (10 mg total) by mouth daily.   diclofenac Sodium 1 % Gel Commonly known as: Voltaren Apply 2 g topically 4 (four) times daily as needed.   esomeprazole 40 MG capsule Commonly known as: NexIUM Take 1 capsule (40 mg total) by mouth in the morning.   fluticasone 50 MCG/ACT nasal spray Commonly known as: FLONASE Place 2 sprays into both nostrils daily. What changed:   when to take this  reasons to take this   lisinopril 5 MG tablet Commonly known as: ZESTRIL Take 1 tablet (5 mg total) by mouth at bedtime.   lubiprostone 24 MCG capsule Commonly known as: AMITIZA Take 1 capsule (24 mcg total) by mouth 2 (two) times daily with a meal.   olopatadine 0.1 % ophthalmic solution Commonly known as: Patanol Place 1 drop into both eyes 2 (two) times daily. What changed:   when to take this  reasons to take this   oxybutynin 5 MG tablet Commonly known as: DITROPAN Take 1 tablet (5 mg total) by mouth every 8 (eight) hours as needed for bladder spasms.   sennosides-docusate sodium 8.6-50 MG tablet Commonly known as: SENOKOT-S Take 2 tablets by mouth in the morning and at bedtime.   tamsulosin 0.4 MG Caps capsule Commonly  known as: FLOMAX Take 0.4 mg by mouth.   torsemide 20 MG tablet Commonly known as: DEMADEX Take 20 mg by mouth daily.   traMADol 50 MG tablet Commonly known as: Ultram Take 1 tablet (50 mg total) by mouth every 6 (six) hours as needed for up to 3 days.       Follow-up Information    [facility] On 02/23/2021.   Why: Follow-up at 8 AM for catheter removal Contact information: [place]              Signed: Marton Redwood, III 02/19/2021, 11:27  AM

## 2021-02-20 ENCOUNTER — Encounter (HOSPITAL_COMMUNITY): Payer: Self-pay | Admitting: Urology

## 2021-02-21 NOTE — Anesthesia Postprocedure Evaluation (Signed)
Anesthesia Post Note  Patient: Omar Sanders  Procedure(s) Performed: TRANSURETHRAL RESECTION OF THE PROSTATE (TURP), BIPOLAR (N/A Urethra)     Patient location during evaluation: PACU Anesthesia Type: General Level of consciousness: awake and alert Pain management: pain level controlled Vital Signs Assessment: post-procedure vital signs reviewed and stable Respiratory status: spontaneous breathing, nonlabored ventilation, respiratory function stable and patient connected to nasal cannula oxygen Cardiovascular status: blood pressure returned to baseline and stable Postop Assessment: no apparent nausea or vomiting Anesthetic complications: no   No complications documented.               Effie Berkshire

## 2021-02-24 LAB — SURGICAL PATHOLOGY

## 2021-02-25 DIAGNOSIS — R338 Other retention of urine: Secondary | ICD-10-CM | POA: Diagnosis not present

## 2021-03-03 ENCOUNTER — Ambulatory Visit: Payer: Medicare Other

## 2021-03-03 NOTE — Progress Notes (Deleted)
   Subjective:   Patient ID: CARSTEN CARSTARPHEN    DOB: Jul 31, 1941, 80 y.o. male   MRN: [id number]  Omar Sanders is a 80 y.o. male with a history of aortic aneurysm, HTN, new RBBB, hiatal hernia, GERD, IBS, Atopic dermatitis, BPH, CKD, abdominal bloating, constipation, GAD, HLD, h/o colonic polyps, seasonal allergies here for abdominal pain.  Chronic Abdominal Pain  Bloating: Patient has chronic history of abdominal pain and bloating in setting of IBS, GERD, constipation. He also has DM.  He follows closely with GI. He had a colonoscopy in July 2021 due to h/o adenomatous colon polyps - this colonoscopy noted 1 mm polyp that was removed and noted to be a tubular adenoma and no recommended follow up due to age. He had a EGD in 2018 with mild grade esophagitis, otherwise negative.  Prior medications tried include MIralax (stopped by patient as he felt unsafe to take with CKD), Linzess (stopped by patient due to increased heartburn), Amitiza (self discontinued as he felt it was not working).   He has also been on several PPI"s in the past but have self discontinued due to "worsening gas and belching".  He is s/p TURP by urology. Currently on Xifaxan and Senekot, Nexium 40mg  PO. He has noted that the bloating is worse with Xifaxan. He has been instructed to avoid dairy, artificial sweeteners and carbonates  He was recommended to follow up with GI in 4-6 weeks. Last appt was 12/30/20. At that time they were recommended possible repeat EGD if no improvement in symptoms. Appears patient canceled his follow up appointment on 02/07/21.  Today patient notes ***  Denies fevers, chills, nausea, vomiting, diarrhea, dysphagia, odynophagia.   Review of Systems:  Per HPI.   Objective:   There were no vitals taken for this visit. Vitals and nursing note reviewed.  General: pleasant ***, sitting comfortably in exam chair, well nourished, well developed, in no acute distress with non-toxic appearance HEENT:  normocephalic, atraumatic, moist mucous membranes, oropharynx clear without erythema or exudate, TM normal bilaterally  Neck: supple, non-tender without lymphadenopathy CV: regular rate and rhythm without murmurs, rubs, or gallops, no lower extremity edema, 2+ radial and pedal pulses bilaterally Lungs: clear to auscultation bilaterally with normal work of breathing on room air Resp: breathing comfortably on room air, speaking in full sentences Abdomen: soft, non-tender, non-distended, no masses or organomegaly palpable, normoactive bowel sounds Skin: warm, dry, no rashes or lesions Extremities: warm and well perfused, normal tone MSK: ROM grossly intact, strength intact, gait normal Neuro: Alert and oriented, speech normal  Assessment & Plan:   No problem-specific Assessment & Plan notes found for this encounter.  No orders of the defined types were placed in this encounter.  No orders of the defined types were placed in this encounter.   {    This will disappear when note is signed, click to select method of visit    :1}  Mina Marble, DO PGY-3, [facility] 03/03/2021 7:38 AM

## 2021-03-04 ENCOUNTER — Ambulatory Visit: Payer: Medicare Other

## 2021-03-04 ENCOUNTER — Other Ambulatory Visit: Payer: Self-pay

## 2021-03-04 ENCOUNTER — Ambulatory Visit (INDEPENDENT_AMBULATORY_CARE_PROVIDER_SITE_OTHER): Payer: Medicare Other | Admitting: Family Medicine

## 2021-03-04 ENCOUNTER — Encounter: Payer: Self-pay | Admitting: Family Medicine

## 2021-03-04 VITALS — BP 115/62 | HR 68 | Ht 67.0 in | Wt 184.6 lb

## 2021-03-04 DIAGNOSIS — Z978 Presence of other specified devices: Secondary | ICD-10-CM | POA: Diagnosis not present

## 2021-03-04 MED ORDER — SENNA-DOCUSATE SODIUM 8.6-50 MG PO TABS: 2.0000 | ORAL_TABLET | Freq: Two times a day (BID) | ORAL | 1 refills | Status: DC

## 2021-03-04 MED ORDER — POLYETHYLENE GLYCOL 3350 17 GM/SCOOP PO POWD: ORAL | 0 refills | Status: DC

## 2021-03-04 NOTE — Patient Instructions (Signed)
Our plan for today: I would like for you to call your surgeon, your urologist to see if they would like to see you before your appointment next week. -I have sent in refills of your senna for constipation as well as a prescription for MiraLAX -You can continue taking Tylenol at this time for pain control but if you need additional help you should reach out to your urologist

## 2021-03-04 NOTE — Assessment & Plan Note (Addendum)
80 year old male who recently had a transurethral resection of the prostate and has follow-up with urology scheduled for next week who presents today because of retention urine in his Foley bag and some discomfort at his penile meatus.  Patient with about 600 cc of pink-tinged urine in the bag which seems appropriate given his recent surgery.  Patient with some pain at the penile meatus but he does have Foley catheter in place and no signs of drainage or obvious injury to the penile meatus.  Vital signs within normal limits and no sign of any systemic distress at this time. Plan: -I discussed with the patient that he should reach out to his urologist to see if they would like to see him before next week.  The patient had requested approval to have the Foley catheter removed but I told him he should reach out to his surgeon and discussed that with them as it would be inappropriate for me to discontinue that. -Follow-up as needed.  Patient does plan to keep his appointment with neurology next week if he has not seen by them sooner.  He does state that he will call them today. -Acetaminophen as needed for discomfort.

## 2021-03-04 NOTE — Progress Notes (Signed)
    SUBJECTIVE:   CHIEF COMPLAINT / HPI:   Red-tinged urine in Foley bag and discomfort at penile meatus: Patient is an 80 year old male the presents today for the above.  He recently had a transurethral resection of the prostate about a week ago.  He states that since his transurethral resection of his prostate he has been having pain and blood tinged urine in his foley bag. He sees his Psychologist, sport and exercise on the 13th. He is having some pain at the penile meatus while the foley is in place.   Constipation: He has been using sennakot for constipation. Last bowel movement was this morning. He has a bowel movement almost every day when taking his medications.     [place] interpreter used for the duration of the patient encounter.  PERTINENT  PMH / PSH: Recent transurethral resection of the prostate  OBJECTIVE:   BP 115/62   Pulse 68   Ht 5\' 7"  (1.702 m)   Wt 184 lb 9.6 oz (83.7 kg)   SpO2 96%   BMI 28.91 kg/m    General: NAD, pleasant, able to participate in exam Respiratory: No respiratory distress GU: Foley in place with 600 cc of pink-tinged urine present.  Penile meatus with no obvious erythema or drainage with Foley in place. Psych: Normal affect and mood  ASSESSMENT/PLAN:   Foley catheter in place 80 year old male who recently had a transurethral resection of the prostate and has follow-up with urology scheduled for next week who presents today because of retention urine in his Foley bag and some discomfort at his penile meatus.  Patient with about 600 cc of pink-tinged urine in the bag which seems appropriate given his recent surgery.  Patient with some pain at the penile meatus but he does have Foley catheter in place and no signs of drainage or obvious injury to the penile meatus.  Vital signs within normal limits and no sign of any systemic distress at this time. Plan: -I discussed with the patient that he should reach out to his urologist to see if they would like to see him  before next week.  The patient had requested approval to have the Foley catheter removed but I told him he should reach out to his surgeon and discussed that with them as it would be inappropriate for me to discontinue that. -Follow-up as needed.  Patient does plan to keep his appointment with neurology next week if he has not seen by them sooner.  He does state that he will call them today. -Acetaminophen as needed for discomfort.   Constipation: Assessment/plan we will give patient refill of the senna and will give him a prescription for MiraLAX.  Patient did have 1 bowel movement today and states that when he takes his constipation medications he does have 1 bowel movement daily.  Recommend he continue these medications.  Lurline Del, [place]    This note was prepared using Dragon voice recognition software and may include unintentional dictation errors due to the inherent limitations of voice recognition software.

## 2021-03-09 ENCOUNTER — Telehealth: Payer: Self-pay

## 2021-03-09 NOTE — Telephone Encounter (Signed)
Patient calls nurse line requesting provider's advice on nutritional supplements. Patient reports that family members bought him nutritional shake similar to Ensure. Advised patient that if he is going to drink nutritional shakes they should be low in sugar (such as glucerna).   Please advise if nutritional supplements are appropriate for patient.   Talbot Grumbling, RN

## 2021-03-10 NOTE — Telephone Encounter (Signed)
I agree with glucerna and low in sugar. Thank you.   [place], DO 03/10/2021, 2:44 PM PGY-2, [place]

## 2021-03-11 DIAGNOSIS — N3000 Acute cystitis without hematuria: Secondary | ICD-10-CM | POA: Diagnosis not present

## 2021-03-18 DIAGNOSIS — R338 Other retention of urine: Secondary | ICD-10-CM | POA: Diagnosis not present

## 2021-03-30 ENCOUNTER — Ambulatory Visit: Payer: Medicare Other

## 2021-04-01 ENCOUNTER — Ambulatory Visit: Payer: Medicare Other | Admitting: Family Medicine

## 2021-04-03 NOTE — Patient Instructions (Signed)
It was great to see you! Thank you for allowing me to participate in your care!  I recommend that you always bring your medications to each appointment as this makes it easy to ensure we are on the correct medications and helps [place] not miss when refills are needed.  Our plans for today:  -For your headache I would like for you to take Tylenol 500 mg every 6 hours when you have a bad headache. -I would like for you to stop the lisinopril medicine.  You can check your blood pressures at home and write these numbers down and I would like for you to bring them at your follow-up appointment in 3 to 4 weeks. -I am sending you a referral for geriatrics clinic.  They should call you within the next week or 2 to schedule an appointment for down the road. -If you develop any other concerns do not hesitate to reach out to [place] before then. -Please keep your appointment with urology later this month.  Take care and seek immediate care sooner if you develop any concerns.   Dr. Lurline Del, [place]

## 2021-04-03 NOTE — Progress Notes (Signed)
    SUBJECTIVE:   CHIEF COMPLAINT / HPI:   Surgical follow-up-transurethral resection of prostate: Patient is a 80 year old male presenting for follow-up after transurethral resection of his prostate.  Previously evaluated by myself on 03/04/2021 and still had the Foley catheter in place and was planning to follow-up with his surgeon on the 13th of that month.  Today he states he did follow up with his urologist and they did remove the foley on 5/20.  Has had a headache that used to come and go but since his surgery has become a "constant pain" and he also experiences dizziness since his surgery. He states he did fall once in the kitchen due to this. It happens every day and especially at night. He also feels nauseated but hasn't vomited due to dizziness. He has had poor appetite. He is currently on lisinopril for blood pressure control as well as Flomax.  He has a urology appointment set for later this month.  [place] interpreter used for the duration of the patient encounter  PERTINENT  PMH / PSH: Hypertension  OBJECTIVE:   BP (!) 104/56   Pulse 66   Ht 5\' 5"  (1.651 m)   Wt 176 lb 2 oz (79.9 kg)   SpO2 97%   BMI 29.31 kg/m    General: NAD, pleasant, able to participate in exam Cardiac: RRR, no murmurs. Respiratory: CTAB, normal effort, No wheezes, rales or rhonchi Skin: warm and dry, no rashes noted Neuro: alert, no obvious focal deficits, CN 2-12 in tact. Strength 5/5 in upper and lower extremities bilaterally.  Psych: Normal affect and mood  ASSESSMENT/PLAN:   Dizziness Assessment: 80 year old male with headaches and dizziness which he states started almost daily after his surgery.  He was recently started on finasteride as well as Flomax but also takes lisinopril 5 mg/day.  Blood pressure today of 104/56 with a pulse of 66.  Patient with a normal neurologic exam today though he does state he has bit of a headache at this time as he has most days.  Believe this is most likely  due to hypotension as the cause of his dizziness.  No concerns for chest pain or shortness of breath during these episodes. We will discontinue lisinopril.  Patient will follow-up in a few weeks.  I believe the patient would benefit from a geriatrics clinic appointment for a more in-depth medication review. -Will stop lisinopril -We will refer patient for geriatrics clinic. -Recommended Tylenol for headache -Follow-up in 2 to 3 weeks to check his blood pressure after stopping lisinopril.  He will continue to check it at home and let me know if he has any elevated numbers.   Referral placed for geriatrics clinic as I believe this patient would benefit from a more thorough medication review and the other services provided by our geriatrics clinic.  Lurline Del, [place]    This note was prepared using Dragon voice recognition software and may include unintentional dictation errors due to the inherent limitations of voice recognition software.

## 2021-04-04 ENCOUNTER — Other Ambulatory Visit: Payer: Self-pay

## 2021-04-04 ENCOUNTER — Ambulatory Visit (INDEPENDENT_AMBULATORY_CARE_PROVIDER_SITE_OTHER): Payer: Medicare Other | Admitting: Family Medicine

## 2021-04-04 ENCOUNTER — Encounter: Payer: Self-pay | Admitting: Family Medicine

## 2021-04-04 VITALS — BP 104/56 | HR 66 | Ht 65.0 in | Wt 176.1 lb

## 2021-04-04 DIAGNOSIS — J302 Other seasonal allergic rhinitis: Secondary | ICD-10-CM | POA: Diagnosis not present

## 2021-04-04 DIAGNOSIS — R42 Dizziness and giddiness: Secondary | ICD-10-CM

## 2021-04-04 MED ORDER — CETIRIZINE HCL 10 MG PO TABS: 10.0000 mg | ORAL_TABLET | Freq: Every day | ORAL | 2 refills | Status: DC

## 2021-04-04 NOTE — Assessment & Plan Note (Addendum)
Assessment: 80 year old male with headaches and dizziness which he states started almost daily after his surgery.  He was recently started on finasteride as well as Flomax but also takes lisinopril 5 mg/day.  Blood pressure today of 104/56 with a pulse of 66.  Patient with a normal neurologic exam today though he does state he has bit of a headache at this time as he has most days.  Believe this is most likely due to hypotension as the cause of his dizziness.  No concerns for chest pain or shortness of breath during these episodes. We will discontinue lisinopril.  Patient will follow-up in a few weeks.  I believe the patient would benefit from a geriatrics clinic appointment for a more in-depth medication review. -Will stop lisinopril -We will refer patient for geriatrics clinic. -Recommended Tylenol for headache -Follow-up in 2 to 3 weeks to check his blood pressure after stopping lisinopril.  He will continue to check it at home and let me know if he has any elevated numbers.

## 2021-04-18 ENCOUNTER — Telehealth: Payer: Self-pay

## 2021-04-18 DIAGNOSIS — Z0189 Encounter for other specified special examinations: Secondary | ICD-10-CM

## 2021-04-18 NOTE — Telephone Encounter (Signed)
Patient calls nurse line stating he was to be set up in geri clinic. This was noted in [place] last office visit note. Will forward to Jazmin to schedule.

## 2021-04-19 NOTE — Addendum Note (Signed)
Addended by: Valerie Roys on: 04/19/2021 10:22 AM   Modules accepted: Orders

## 2021-04-19 NOTE — Telephone Encounter (Signed)
Appt made for geri clinic and appt reminder mailed to patient.  CCM referral placed.  Paperwork not mailed at this time. Omar Sanders,CMA

## 2021-04-21 ENCOUNTER — Telehealth: Payer: Self-pay

## 2021-04-21 NOTE — Telephone Encounter (Signed)
Reviewed medication list. Zyrtec was refilled 04/04/2021. If patient needs additional or different medication he will need to make a clinic visit to determined the need. Thank you.   Gerlene Fee, DO 04/21/2021, 3:24 PM PGY-2, [place]

## 2021-04-21 NOTE — Telephone Encounter (Signed)
Patient calls nurse line requesting a refill on medication for itching. I do not see anything on his current medication list. Patient declines an apt, as he was just seen a few ago. Will forward to PCP.

## 2021-04-22 ENCOUNTER — Telehealth: Payer: Self-pay | Admitting: *Deleted

## 2021-04-22 DIAGNOSIS — R338 Other retention of urine: Secondary | ICD-10-CM | POA: Diagnosis not present

## 2021-04-22 NOTE — Chronic Care Management (AMB) (Signed)
  Chronic Care Management   Outreach Note  04/22/2021 Name: Omar Sanders MRN: [id number] DOB: 21-Apr-1941  [name] is a 80 y.o. year old male who is a primary care patient of Autry-Lott, Francisco, DO. I reached out to [place] S[place] by phone today in response to a referral sent by Mr. Jaron H Rode's PCP Autry-Lott, Naaman Plummer, DO     An unsuccessful telephone outreach was attempted today. The patient was referred to the case management team for assistance with care management and care coordination.   Follow Up Plan: A HIPAA compliant phone message was left for the patient providing contact information and requesting a return call via interpreter  If patient returns call to provider office, please advise to call Embedded Care Management Care Guide Maiah Sinning at [place], [place] Management  Direct Dial: [phone]

## 2021-05-03 NOTE — Chronic Care Management (AMB) (Signed)
  Care Management   Outreach Note  05/03/2021 Name: WILBERN PENNYPACKER MRN: [id number] DOB: May 23, 1941  Referred by: Gerlene Fee, DO Reason for referral : Chronic Care Management (Initial outreach to schedule referral with Licensed Clinic SW )   A second unsuccessful telephone outreach was attempted today. The patient was referred to the case management team for assistance with care management and care coordination.   Follow Up Plan: A HIPAA compliant phone message was left for the patient providing contact information and requesting a return call.  If patient returns call to provider office, please advise to call Embedded Care Management Care Guide Tonyia Marschall at [place], [place] [facility]  Direct Dial: [phone]

## 2021-05-10 NOTE — Chronic Care Management (AMB) (Signed)
  Care Management   Outreach Note  05/10/2021 Name: Omar Sanders MRN: [id number] DOB: [birth year]  Referred by: Gerlene Fee, DO Reason for referral : Chronic Care Management (Initial outreach to schedule referral with Licensed Clinic SW )   Third unsuccessful telephone outreach was attempted today. The patient was referred to the case management team for assistance with care management and care coordination. The patient's primary care provider has been notified of our unsuccessful attempts to make or maintain contact with the patient. The care management team is pleased to engage with this patient at any time in the future should he/she be interested in assistance from the care management team.   Follow Up Plan: We have been unable to make contact with the patient for follow up. The care management team is available to follow up with the patient after provider conversation with the patient regarding recommendation for care management engagement and subsequent re-referral to the care management team.   Julian Hy, [place] [facility]  Direct Dial: [phone]

## 2021-05-19 ENCOUNTER — Other Ambulatory Visit: Payer: Self-pay

## 2021-05-19 ENCOUNTER — Ambulatory Visit (INDEPENDENT_AMBULATORY_CARE_PROVIDER_SITE_OTHER): Payer: Medicare Other | Admitting: Family Medicine

## 2021-05-19 ENCOUNTER — Encounter: Payer: Self-pay | Admitting: Family Medicine

## 2021-05-19 ENCOUNTER — Other Ambulatory Visit: Payer: Self-pay | Admitting: Family Medicine

## 2021-05-19 VITALS — BP 100/56 | HR 86 | Ht 65.0 in | Wt 181.2 lb

## 2021-05-19 DIAGNOSIS — Z0101 Encounter for examination of eyes and vision with abnormal findings: Secondary | ICD-10-CM | POA: Diagnosis not present

## 2021-05-19 DIAGNOSIS — R94120 Abnormal auditory function study: Secondary | ICD-10-CM | POA: Diagnosis not present

## 2021-05-19 DIAGNOSIS — R4181 Age-related cognitive decline: Secondary | ICD-10-CM | POA: Diagnosis not present

## 2021-05-19 DIAGNOSIS — W19XXXA Unspecified fall, initial encounter: Secondary | ICD-10-CM | POA: Insufficient documentation

## 2021-05-19 DIAGNOSIS — K760 Fatty (change of) liver, not elsewhere classified: Secondary | ICD-10-CM | POA: Insufficient documentation

## 2021-05-19 DIAGNOSIS — E559 Vitamin D deficiency, unspecified: Secondary | ICD-10-CM | POA: Insufficient documentation

## 2021-05-19 DIAGNOSIS — H9313 Tinnitus, bilateral: Secondary | ICD-10-CM | POA: Diagnosis not present

## 2021-05-19 DIAGNOSIS — G609 Hereditary and idiopathic neuropathy, unspecified: Secondary | ICD-10-CM | POA: Diagnosis not present

## 2021-05-19 DIAGNOSIS — R296 Repeated falls: Secondary | ICD-10-CM

## 2021-05-19 DIAGNOSIS — L309 Dermatitis, unspecified: Secondary | ICD-10-CM

## 2021-05-19 DIAGNOSIS — L209 Atopic dermatitis, unspecified: Secondary | ICD-10-CM | POA: Diagnosis not present

## 2021-05-19 HISTORY — DX: Fatty (change of) liver, not elsewhere classified: K76.0

## 2021-05-19 HISTORY — DX: Repeated falls: R29.6

## 2021-05-19 HISTORY — DX: Unspecified fall, initial encounter: W19.XXXA

## 2021-05-19 MED ORDER — GABAPENTIN 100 MG PO CAPS: 100.0000 mg | ORAL_CAPSULE | Freq: Three times a day (TID) | ORAL | 0 refills | Status: DC

## 2021-05-19 MED ORDER — HALOBETASOL PROPIONATE 0.05 % EX CREA: TOPICAL_CREAM | Freq: Every day | CUTANEOUS | 0 refills | Status: DC

## 2021-05-19 NOTE — Progress Notes (Deleted)
Office Visit: Geriatrics Clinic:  @MECRED @, @TDNONREFRESHABLE @, There are other unrelated non-urgent complaints, but due to the busy schedule and the amount of time I've already spent with him, time does not permit me to address these routine issues at today's visit. I've requested another appointment to review these additional issues. [facility]  Patient Name:@NAME2 @ DOB:[birth year]; Age/Gender:@AGE2 @/male MRN # [id number] @PCPAMB @  Patient is accompanied by: {relatives - adult:5061::"mother"} Primary caregiver: {relatives - adult:5061::"mother"} History obtained from: {Assessment Source:532703}  History Chief Complaint  Patient presents with   Follow-up    HPI: As above, in addition:  @PROBEDITEDCOMMENTS @  ROS General: Denies fevers/chills; denies changes in appetite;  Impairments: Denies changes in vision / hearing / smell / taste; ENT: Denies runny nose / ear pain or discharge / sore throat / sinus congestion  Respiratory: Denies cough/w phlegm; Denies chest congestion / wheezing;  Cardiovascular: Denies chest pain; denies heart beating slower/thumps inside chest; denies racing heart/flutter; denies pain in legs/feet with walking,  Genitourinary: Denies dysuria; denies hematuria; denies difficulty voiding and denies incontinence of urine, Gastrointestinal: Denies constipation; denies melena/hematochezia; denies diarrhea;  Denies abdominal discomfort/gaseous bloating; denies N/V; denies heart burn;  Gait/Falls: Denies recent falls/unsteady gait;  Neuro: Denies unilateral weakness / clumsiness / tingling / numbness; denies tremors;  Psych: Denies sadness / anxiety / suicidal tendencies;  Musculo-skeletal: None / usual chronic arthritis pain Skin: denies rash and itching Weight changes as follows:  Wt Readings from Last 3 Encounters:  05/19/21 181 lb 4 oz (82.2 kg)  04/04/21 176 lb 2 oz (79.9 kg)  03/04/21 184 lb 9.6 oz (83.7 kg)     Health Maintenance  reviewed: Immunization History  Administered Date(s) Administered   DTaP 12/08/1999, 02/06/2000, 08/06/2000   Influenza, High Dose Seasonal PF 08/14/2019   Influenza,inj,Quad PF,6+ Mos 08/04/2016, 08/03/2017, 07/29/2018   Influenza-Unspecified 08/28/2014   PFIZER(Purple Top)SARS-COV-2 Vaccination 09/21/2020, 10/19/2020   Pneumococcal Conjugate-13 03/13/2015   Pneumococcal Polysaccharide-23 04/14/2016   Varicella 03/06/2002   Zoster Recombinat (Shingrix) 08/14/2019    Risk Evaluation:  Depression Screen (if positive will perform GDS / PHQ-9)  1. Over the last two weeks, have you felt down, depressed, or hopeless? {Yes/No:11203}  2. Over the last two weeks, have you felt little interest or pleasure in doing things? {Yes/No:11203}    Vision Impairment Screen  1. Do you have visual impairment that affects your daily life activities? {Yes/No:11203}    Hearing Loss Screen (if positive, will refer to ENT for full evaluation)  1. Do you have trouble hearing the television or radio when others do not? {Yes/No:11203}  2. Do you strain or struggle to hear/understand conversations? {Yes/No:11203}    Function Screen  1. Do you live alone? {Yes/No:11203}  2. Do you have a friend or family member readily that you can call for help in case of an emergency? {Yes/No:11203}   Home Safety Screen  1. Does your home have throw rugs, poor lighting, or a slippery bathtub/shower? {Yes/No:11203}  2. Does your home LACK grab bars in bathrooms, handrails on stairs and steps? {Yes/No:11203}  3. Does your home LACK functioning smoke alarms? {Yes/No:11203}    Risk for Falls Screen  1. Was the patient unsteady or take longer than 30 seconds during the timed "get up and go" test? {Yes  No:88242}    Incontinence Screen  1. Can you make it to the toilet without having accidental leakage of urine/bowel contents? {Yes/No:11203}  2. Do you accidentally leak urine when you cough or sneeze? {Yes/No:11203}  Mini-Mental State Examination: Total Score:   Geriatric Depression Scale: Total Score:    Advanced Directives: Code status: {Code Status:930101}  Diet Plan: {ECU GERI DIET WIOX:73532}  Medications / Allergies were reviewed: @CURMEDTAB @ Allergies  Allergen Reactions   Pantoprazole Other (See Comments)    "Chest feels hot, stomach irritation"   Aspirin Other (See Comments)    Abdominal pain    Lubiprostone Other (See Comments)    Stomach irritation    Vitamin C Other (See Comments)    Abdominal pain    Medical Histories reviewed and updated: Past Medical History:  Diagnosis Date   Abdominal bloating 07/20/2020   Allergy    Anal fissure    Anemia    Anxiety    Arthritis    all over body    Benign prostatic hyperplasia    Bilateral lower extremity edema 03/22/2018   Cataract    removed bilat    Chest pain 02/2018   Chronic kidney disease    Chronic kidney disease, stage 3a ([place]) 01/19/2016   Chronic pain    Colon polyp    Diabetes mellitus without complication ([place])    Falls 05/19/2021   GERD (gastroesophageal reflux disease)    Headache(784.0)    Hemorrhoids    Hepatic steatosis 05/19/2021   Hyperlipidemia    Hx: of   Hypertension    IBS (irritable bowel syndrome)    Incontinent of urine    Hx:of   Migraines    Neuromuscular disorder ([place])    periferal neuropathy per patient   New onset right bundle branch block (RBBB)    OSA (obstructive sleep apnea) 11/2009   sleep study with mild OSA, pt unwilling to use CPAP   Seizures ([place])    pt states he has never had a seizure    Sleep apnea    mild    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 Social History   Socioeconomic History   Marital status: Widowed    Spouse name: Not on file   Number of children: 5   Years of education: Not on file   Highest education level: Not on file  Occupational History   Not on  file  Tobacco Use   Smoking status: Never   Smokeless tobacco: Never  Vaping Use   Vaping Use: Never used  Substance and Sexual Activity   Alcohol use: No   Drug use: No   Sexual activity: Not Currently  Other Topics Concern   Not on file  Social History Narrative   Not on file   Social Determinants of Health   Financial Resource Strain: Not on file  Food Insecurity: Not on file  Transportation Needs: Not on file  Physical Activity: Not on file  Stress: Not on file  Social Connections: Not on file  Intimate Partner Violence: Not on file    Vital Signs Weight: 181 lb 4 oz (82.2 kg) Body mass index is 30.16 kg/m. CrCl cannot be calculated (Patient's most recent lab result is older than the maximum 21 days allowed.). Body surface area is 1.94 meters squared. Wt Readings from Last 2 Encounters:  05/19/21 181 lb 4 oz (82.2 kg)  04/04/21 176 lb 2 oz (79.9 kg)   Vitals:   05/19/21 1349  Weight: 181 lb 4 oz (82.2 kg)       Physical Examination:    General: doesn't appear dehydrated, doesn't appear to be in  pain, alert and communicative, oriented to person, place, and time, HEENT: Moist  Mucous membranes, PERRLA, posterior pharynx without erythema or exudate, negative for palpable cervical lymph nodes, negative for palpable thyromegaly or thyroid nodules, CVS: S1S2 heard, sinus rhythm, normal rate, Lungs: Clear to auscultation bilaterally, normal respiratory effort, negative for crackles or rhonchi, Abdominal exam: Normo-active bowel sounds, not distended, not tender to superficial or deep palpation, negative for palpable masses and organomegaly Extremities: negative for edema & cyanosis, negative for calf tenderness, extremities warm & nontender, with good capillary refill,   Musculo-skeletal exam: normal ROM all major joints, no TTP around joints, negative for gross deformities,  Neurologic exam: negative for gross focal motor or sensory deficits, cranial nerves grossly  intact, cognition grossly normal, attention span & concentration grossly normal, Psychiatric exam: doesn't appear depressed, doesn't appear delirious, doesn't appear anxious Skin: negative for skin breakdown and rashes   Reviewed / Ordered:  {med records/labs/xray ordered:83399}  Assessment and Plan: Problem List Items Addressed This Visit   None  @AMBMEDS @  ***  Electronically signed by: @MECRED @ / @TDNONREFRESHABLE @

## 2021-05-20 DIAGNOSIS — R94120 Abnormal auditory function study: Secondary | ICD-10-CM | POA: Insufficient documentation

## 2021-05-20 DIAGNOSIS — R4181 Age-related cognitive decline: Secondary | ICD-10-CM | POA: Insufficient documentation

## 2021-05-20 DIAGNOSIS — H9313 Tinnitus, bilateral: Secondary | ICD-10-CM | POA: Insufficient documentation

## 2021-05-20 DIAGNOSIS — Z0101 Encounter for examination of eyes and vision with abnormal findings: Secondary | ICD-10-CM | POA: Insufficient documentation

## 2021-05-20 NOTE — Assessment & Plan Note (Signed)
Established problem.  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  Should Omar Sanders wish to address his hearing loss with a hearing aid, I recommend consultation with [facility] Audiology Department for testing and Hearing Aid fitting.

## 2021-05-20 NOTE — Assessment & Plan Note (Addendum)
Normal Cognitive test score ([place] Universal Dementia Assessment Scale) 27 out of 30 (22 or less is abnormal) No dependence in ADLs and iADLs Discussed role of diet, exercise, and social activity in preserving our mental faculties.

## 2021-05-20 NOTE — Assessment & Plan Note (Signed)
Established problem Should patient want to pursue masking hearing aids - recommend Tinnitus clinic at [facility] Audiology Department

## 2021-05-20 NOTE — Patient Instructions (Signed)

## 2021-05-20 NOTE — Assessment & Plan Note (Signed)
Intermittent flares Moisturize Refill Ultravate - no further reills

## 2021-05-20 NOTE — Assessment & Plan Note (Signed)
Slight decrease in both eyes with his glasses. Appears there have been several referrals to ophthalmology for diabetic eye exam since at least 2017 which have not been completed.  May want to encourage Omar Sanders to see an ophthalmologist for his decrease visual acuity. Maybe he will respond to this concern.

## 2021-05-20 NOTE — Progress Notes (Signed)
Provider:  Lissa Morales, MD Location:  [place] of Service:    [place]  PCP: Gerlene Fee, DO Patient Care Team: Gerlene Fee, DO as PCP - General (Family Medicine) O'Neal, Cassie Freer, MD as PCP - Cardiology (Internal Medicine) Virgia Land, MD as Consulting Physician (Gastroenterology)  Extended Emergency Contact Information Primary Emergency Contact: [facility] Address: [address] [place], [place] [address] John[place] Litter of [place] Phone: [phone] Mobile Phone: [phone] Relation: Son Secondary Emergency Contact: Sinh,Cil Address: [place], [place] [address] Johnnette Litter of [place] Phone: [phone] Work Phone: [phone] Mobile Phone: [phone] Relation: Daughter  Code Status: DNR Goals of Care: Advanced Directive information Advanced Directives 04/04/2021  Does Patient Have a Medical Advance Directive? No  Does patient want to make changes to medical advance directive? -  Would patient like information on creating a medical advance directive? No - Patient declined  Pre-existing out of facility DNR order (yellow form or pink MOST form) -     [facility]:   Patient is alone Primary caregiver:  self Patient's Currently living arrangement:  son, daughter, and daughter's two children Patient information was obtained from office notes  past medical records and labs, radiology reports. History/Exam limitations:  Language.  We were fortunate to have a [place] interpreter in the room. Interview conducted with hearing amplifier headset for patient Primary Care Provider:   Dr Janus Molder Referring provider: Dr Lurline Del Reason for referral:  Geriatric  Assessment  ----------------------------------------------------------------------------------------------------------------------------------------------------------------------------------------------------------------------------------------------------------------   HPI by problems:  Chief Complaint  Patient presents with   Memory Loss     Cognitive impairment concern  Are there problems with thinking?  Cognition domains: Memory difficulties Difficulty in recalling the work he wants to say When were the changes first noticed?  years  Did this change occur abruptly or gradually?  gradual  How have the changes progressed since then?  staying constant  Has there been any tremors or abnormal movements?  no  Have they had in hallucinations or delusions:  no  Have they appeared more anxious or sad lately?  no  Do they still have interests or activities they enjor doing?  no      Compared to 5 to 10 years ago, how is the patient at:  Problems with Judgment, e.g., problem making decisions, bad financial decisions, problems with thinking?  no  Less interested in hobbies or previously enjoyed activities?  no  Problem remembering things about family and friends e.g. names,  occupations, birthdays, addresses?  no  Problem remembering conversations or news events a few days later?  no  Problem remembering what day and month it is? sometimes  Problem with losing things?  yes, He has misplaced cash.  Problem learning to use a new gadget or machine around the house, e.g., cell phones, computer, microwave, remote control?  yes, specifically his smart phone  Problem with handling money for shopping?  no  Problem handling financial matters, e.g. their pension, checking, credit cards, dealing with the bank?  no  Problem with getting lost in familiar places?  no   Geriatric Depression Scale:  1 / 15   Outpatient Encounter Medications as of 05/19/2021   Medication Sig   atorvastatin (LIPITOR) 40 MG tablet Take 1 tablet (40  mg total) by mouth daily.   bisacodyl (DULCOLAX) 5 MG EC tablet Take 5 mg by mouth daily as needed for moderate constipation.   cetirizine (ZYRTEC) 10 MG tablet Take 1 tablet (10 mg total) by mouth daily.   diclofenac Sodium (VOLTAREN) 1 % GEL Apply 2 g topically 4 (four) times daily.   esomeprazole (NEXIUM) 40 MG packet Take 40 mg by mouth daily before breakfast.   finasteride (PROSCAR) 5 MG tablet Take 5 mg by mouth daily.   fluticasone (FLONASE) 50 MCG/ACT nasal spray Place 2 sprays into both nostrils daily. (Patient taking differently: Place 2 sprays into both nostrils daily as needed for allergies.)   gabapentin (NEURONTIN) 100 MG capsule Take 1 capsule (100 mg total) by mouth 3 (three) times daily.   halobetasol (ULTRAVATE) 0.05 % cream Apply topically daily.   tamsulosin (FLOMAX) 0.4 MG CAPS capsule Take 0.4 mg by mouth.   torsemide (DEMADEX) 20 MG tablet Take 20 mg by mouth daily.   [DISCONTINUED] halobetasol (ULTRAVATE) 0.05 % cream Apply topically 2 (two) times daily.   lisinopril (ZESTRIL) 5 MG tablet Take 1 tablet (5 mg total) by mouth at bedtime.   olopatadine (PATANOL) 0.1 % ophthalmic solution Place 1 drop into both eyes 2 (two) times daily. (Patient taking differently: Place 1 drop into both eyes 2 (two) times daily as needed for allergies.)   polyethylene glycol powder (GLYCOLAX/MIRALAX) 17 GM/SCOOP powder Take one scoop, 17g daily for constipation   sennosides-docusate sodium (SENOKOT-S) 8.6-50 MG tablet Take 2 tablets by mouth in the morning and at bedtime.   [DISCONTINUED] acetaminophen (TYLENOL) 325 MG tablet Take 2 tablets (650 mg total) by mouth every 6 (six) hours as needed for mild pain (or Fever >/= 101). (Patient not taking: No sig reported)   [DISCONTINUED] acetaminophen (TYLENOL) 500 MG tablet Take 1,000 mg by mouth in the morning, at noon, and at bedtime.   [DISCONTINUED] alfuzosin (UROXATRAL) 10  MG 24 hr tablet Take 10 mg by mouth daily. (Patient not taking: Reported on 04/04/2021)   [DISCONTINUED] carbamide peroxide (DEBROX) 6.5 % OTIC solution Place 5 drops into both ears 2 (two) times daily as needed (ear wax).   [DISCONTINUED] diclofenac Sodium (VOLTAREN) 1 % GEL Apply 2 g topically 4 (four) times daily as needed. (Patient not taking: No sig reported)   [DISCONTINUED] esomeprazole (NEXIUM) 40 MG capsule Take 1 capsule (40 mg total) by mouth in the morning. (Patient not taking: No sig reported)   [DISCONTINUED] lubiprostone (AMITIZA) 24 MCG capsule Take 1 capsule (24 mcg total) by mouth 2 (two) times daily with a meal. (Patient not taking: No sig reported)   [DISCONTINUED] oxybutynin (DITROPAN) 5 MG tablet Take 1 tablet (5 mg total) by mouth every 8 (eight) hours as needed for bladder spasms. (Patient not taking: Reported on 04/04/2021)   No facility-administered encounter medications on file as of 05/19/2021.    History Patient Active Problem List   Diagnosis Date Noted   Foley catheter in place 03/04/2021    Priority: High   BPH with obstruction/lower urinary tract symptoms 02/18/2021    Priority: High   Aortic aneurysm ([place]) 03/26/2018    Priority: High   Type 2 diabetes mellitus with diabetic chronic kidney disease ([place]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    Priority: High   Hyperlipidemia associated with type 2 diabetes mellitus ([place]) 06/14/2016    Priority: High   Chronic kidney disease, stage 3a ([place]) 01/19/2016    Priority: High   BPH (benign prostatic hyperplasia) 05/06/2015  Priority: High   Generalized anxiety disorder 12/29/2014    Priority: High   Hypertension associated with diabetes ([place]) 04/10/2007    Priority: High   GERD 05/19/2010    Priority: Medium   Seasonal allergies 04/10/2007    Priority: Medium   Falls 05/19/2021    Priority: Low   Hepatic steatosis 05/19/2021    Priority: Low   Vitamin D insufficiency     Priority: Low   Rotator cuff disorder, left 07/28/2019     Priority: Low   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    Priority: Low   Constipation 06/01/2017    Priority: Low   Irritable bowel syndrome with constipation 05/19/2010    Priority: Low   PERSONAL HX COLONIC POLYPS 05/19/2010    Priority: Low   HIATAL HERNIA 04/10/2007    Priority: Low   Age-related cognitive decline 05/20/2021   Tinnitus aurium, 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   Abnormal hearing screen 05/20/2021   Failed vision screen 05/20/2021   Atopic dermatitis 03/04/2020   Dizziness 02/14/2016   Past Medical History:  Diagnosis Date   Abdominal bloating 07/20/2020   Allergy    Anal fissure    Anemia    Anxiety    Arthritis    all over body    Benign prostatic hyperplasia    Bilateral lower extremity edema 03/22/2018   Cataract    removed bilat    Chest pain 02/2018   Chronic kidney disease    Chronic kidney disease, stage 3a ([place]) 01/19/2016   Chronic pain    Colon polyp    Diabetes mellitus without complication ([place])    Falls 05/19/2021   GERD (gastroesophageal reflux disease)    Headache(784.0)    Hemorrhoids    Hepatic steatosis 05/19/2021   Hyperlipidemia    Hx: of   Hypertension    IBS (irritable bowel syndrome)    Incontinent of urine    Hx:of   Migraines    Neuromuscular disorder ([place])    periferal neuropathy per patient   New onset right bundle branch block (RBBB)    OSA (obstructive sleep apnea) 11/2009   sleep study with mild OSA, pt unwilling to use CPAP   Seizures ([place])    pt states he has never had a seizure    Sleep apnea    mild    Type 2 diabetes mellitus with diabetic chronic kidney disease ([place]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   Past Surgical History:  Procedure Laterality Date   CARDIAC CATHETERIZATION  06/04/2009   mild to moderate CAD  w/o hemodynamic significant lesion   CATARACT EXTRACTION     bilat   CHOLECYSTECTOMY  2008   COLONOSCOPY  2012   Dr Collene Mares   HEMORRHOID SURGERY     anal surgery   INGUINAL HERNIA REPAIR Bilateral 03/11/2013   Procedure:  LAPAROSCOPIC BILATERAL INGUINAL HERNIA REPAIR;  Surgeon: Ralene Ok, MD;  Location: [facility];  Service: General;  Laterality: Bilateral;   INSERTION OF MESH Bilateral 03/11/2013   Procedure: INSERTION OF MESH;  Surgeon: Ralene Ok, MD;  Location: [facility];  Service: General;  Laterality: Bilateral;   NM MYOCAR PERF WALL MOTION  05/27/2009   mild iscemia mid inferio & apical regions.   NM MYOCAR PERF WALL MOTION  11/01/2012   negative for ischemia   POLYPECTOMY     TRANSURETHRAL RESECTION OF PROSTATE N/A 02/18/2021   Procedure: TRANSURETHRAL RESECTION OF THE PROSTATE (TURP), BIPOLAR;  Surgeon: Ceasar Mons, MD;  Location: [facility];  Service: Urology;  Laterality: N/A;  US ECHOCARDIOGRAPHY  05/27/2009   mild mitral annular ca+,AOV mildly sclerotic   Family History  Problem Relation Age of Onset   Hypertension Mother    Colon cancer Neg Hx    Colon polyps Neg Hx    Esophageal cancer Neg Hx    Rectal cancer Neg Hx    Stomach cancer Neg Hx    Social History   Socioeconomic History   Marital status: Widowed    Spouse name: Not on file   Number of children: 5   Years of education: 16   Highest education level: Bachelor's degree (e.g., BA, AB, BS)  Occupational History   Not on file  Tobacco Use   Smoking status: Never   Smokeless tobacco: Never  Vaping Use   Vaping Use: Never used  Substance and Sexual Activity   Alcohol use: No   Drug use: No   Sexual activity: Not Currently  Other Topics Concern   Not on file  Social History Narrative   Mr Berardino lives with his son and daughter with the daughter's two children ages.  Two of his daughter's children are off in college (04/2021)   Mr Broderson has two other children in [place].      Mr Vedder is by himself much of the day as his daughter works long day shifts.    Mr. Pamphile cooks, does laundry for family, and housework.    Cardiovascular Risk Factors: Hypertension, Lipids, and NIDDM  Educational History: 16 years  formal education, His has a BA in Network engineer History of Seizures: No -  Personal History of Stroke: No -  Personal History of Head Trauma: No -  Personal History of Psychiatric Disorders: No -     Basic Activities of Daily Living  Dressing: Self-care Eating: Self-care Ambulation: Self-care Toileting: Self-care Bathing: Self-care  Instrumental Activities of Daily Living Shopping: Self-care House/Yard Work: Self-care Administration of medications: Self-care Finances: Self-care Telephone: Self-care Transportation: Self-care  Mobility Assist Devices: No assist devices   FALLS in last five office visits:  Fall Risk  03/04/2021 07/14/2020 04/15/2020 03/03/2020 07/15/2019  Falls in the past year? 0 0 0 0 0  Number falls in past yr: 0 0 0 - 0  Injury with Fall? 0 0 0 - -  Risk for fall due to : - - - - -    Health Maintenance reviewed: Immunization History  Administered Date(s) Administered   DTaP 12/08/1999, 02/06/2000, 08/06/2000   Influenza, High Dose Seasonal PF 08/14/2019   Influenza,inj,Quad PF,6+ Mos 08/04/2016, 08/03/2017, 07/29/2018   Influenza-Unspecified 08/28/2014   PFIZER(Purple Top)SARS-COV-2 Vaccination 09/21/2020, 10/19/2020   Pneumococcal Conjugate-13 03/13/2015   Pneumococcal Polysaccharide-23 04/14/2016   Varicella 03/06/2002   Zoster Recombinat (Shingrix) 08/14/2019   Health Maintenance Topics with due status: Overdue     Topic Date Due   OPHTHALMOLOGY EXAM Never done   TETANUS/TDAP Never done   Zoster Vaccines- Shingrix 10/09/2019   COVID-19 Vaccine 03/19/2021    Diet: Regular Nutritional supplements: none   Vital Signs Weight: 181 lb 4 oz (82.2 kg) Body mass index is 30.16 kg/m. CrCl cannot be calculated (Patient's most recent lab result is older than the maximum 21 days allowed.). Body surface area is 1.94 meters squared. Vitals:   05/19/21 1349  BP: (!) 100/56  Pulse: 86  SpO2: 96%  Weight: 181 lb 4 oz (82.2 kg)  Height: 5'  5" (1.651 m)   Wt Readings from Last 3 Encounters:  05/19/21 181 lb 4 oz (82.2 kg)  04/04/21 176 lb 2 oz (79.9 kg)  03/04/21 184 lb 9.6 oz (83.7 kg)   Hearing Screening   '250Hz'$  '500Hz'$  '1000Hz'$  '2000Hz'$  '4000Hz'$   Right ear Fail Fail Pass Fail Fail  Left ear Fail Fail Pass Fail Fail   Vision Screening   Right eye Left eye Both eyes  Without correction     With correction '20/50 20/50 20/40 '$    Physical Examination:  VS reviewed Physical Exam  Vitals:   05/19/21 1349  BP: (!) 100/56  Pulse: 86  SpO2: 96%    HEENT: Bilateral EAC adequately patent, I.e., able to see TMs, right TM retracted General physical exam: well-dressed and groomed. Pleasant and cooperative. Cardiac: Regular rate and rhythm without murmur.  Lungs: Clear to auscultation.   No other insights were derived from the general Exam   Parkinsonism Yes '[]'$  No '[x]'$   Rigidity Ab nl ? Yes '[]'$  No '[x]'$   Gait Abnl ? No abnormality.  Good stride, no going past midline with swing phase Tremor ? Yes '[]'$  No '[x]'$     Timed Up & Go Test: 9 seconds Sit-to-Stand Test: 12 / 30-seconds 4-Stage Stand Test:  Feet Side-by-side: Yes.       Feet Semi-tandem: Yes.       Feet Tandem: No.       [place] Universal Dementia Assessment Scale Score 27 out of 30 (Abnormal is less than 22)  Patient did  require additional cues or prompts to complete tasks. Patient was cooperative and attentive to testing tasks Patient did  appear motivated to perform well      No flowsheet data found.  No flowsheet data found.      No flowsheet data found.   Labs No components found for: [facility]  Lab Results  Component Value Date   [id number] 07/10/2017    Lab Results  Component Value Date   FOLATE 9.5 11/01/2012    Lab Results  Component Value Date   TSH 3.590 06/01/2017    No results found for: RPR  No results found for: HIV    Chemistry      Component Value Date/Time   NA 143 02/18/2021 1100   NA 141 08/23/2020 1129   K 3.5  02/18/2021 1100   CL 110 02/18/2021 1100   CO2 24 02/18/2021 1100   BUN 22 02/18/2021 1100   BUN 16 08/23/2020 1129   CREATININE 1.32 (H) 02/18/2021 1100   CREATININE 1.55 (H) 07/31/2016 1502   GLU 78 02/10/2013 0000      Component Value Date/Time   CALCIUM 9.1 02/18/2021 1100   ALKPHOS 52 11/22/2019 1148   AST 29 11/22/2019 1148   ALT 25 11/22/2019 1148   BILITOT 0.9 11/22/2019 1148   BILITOT 0.4 07/15/2019 1536       CrCl cannot be calculated (Patient's most recent lab result is older than the maximum 21 days allowed.).   Lab Results  Component Value Date   HGBA1C 6.4 01/20/2021     '@10RELATIVEDAYS'$ @ Hearing Screening   '250Hz'$  '500Hz'$  '1000Hz'$  '2000Hz'$  '4000Hz'$   Right ear Fail Fail Pass Fail Fail  Left ear Fail Fail Pass Fail Fail   Vision Screening   Right eye Left eye Both eyes  Without correction     With correction '20/50 20/50 20/40 '$   Lab Results  Component Value Date   WBC 7.8 02/19/2021   HGB 11.7 (L) 02/19/2021   HCT 36.6 (L) 02/19/2021   MCV 83.6 02/19/2021   PLT 201 02/19/2021    No  results found for this or any previous visit (from the past 24 hour(s)).  Imaging Head CT: 2011; WNL  ------------------------------------------------------------------------------------------------------------------------------------------------------------------------------------------------------------------------------------------------------------------------------------------------------------------------------------------------------------------------------------------------------------------------------------------------------------------------------------------------------------  Assessment and Plan: Please see individual consultation notes from physical therapy, pharmacy and social work for today.    Problem List Items Addressed This Visit     Falls   Atopic dermatitis    Intermittent flares Moisturize Refill Ultravate - no further reills       Age-related  cognitive decline - Primary    Normal Cognitive test score ([place] Universal Dementia Assessment Scale) 27 out of 30 (22 or less is abnormal) No dependence in ADLs and iADLs Discussed role of diet, exercise, and social activity in preserving our mental faculties.        Tinnitus aurium, bilateral    Established problem Should patient want to pursue masking hearing aids - recommend Tinnitus clinic at [facility] Audiology Department       Abnormal hearing screen    Established problem.  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  Should Mr Lozo wish to address his hearing loss with a hearing aid, I recommend consultation with [facility] Audiology Department for testing and Hearing Aid fitting.          Failed vision screen    Slight decrease in both eyes with his glasses. Appears there have been several referrals to ophthalmology for diabetic eye exam since at least 2017 which have not been completed.  May want to encourage Mr Gale to see an ophthalmologist for his decrease visual acuity. Maybe he will respond to this concern.        Other Visit Diagnoses     Dermatitis       Relevant Medications   halobetasol (ULTRAVATE) 0.05 % cream   Idiopathic neuropathy       Relevant Medications   gabapentin (NEURONTIN) 100 MG capsule      Age-related cognitive decline Normal Cognitive test score ([place] Universal Dementia Assessment Scale) 27 out of 30 (22 or less is abnormal) No dependence in ADLs and iADLs Discussed role of diet, exercise, and social activity in preserving our mental faculties.   Atopic dermatitis Intermittent flares Moisturize Refill Ultravate - no further reills  Tinnitus aurium, bilateral Established problem Should patient want to pursue masking hearing aids - recommend [facility]innitus clinic at [facility] Audiology Department  Abnormal hearing screen Established problem.  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  Should Mr  Pupillo wish to address his hearing loss with a hearing aid, I recommend consultation with [facility] Audiology Department for testing and Hearing Aid fitting.     Failed vision screen Slight decrease in both eyes with his glasses. Appears there have been several referrals to ophthalmology for diabetic eye exam since at least 2017 which have not been completed.  May want to encourage Mr Mulla to see an ophthalmologist for his decrease visual acuity. Maybe he will respond to this concern.    CPT E&M Office Visit Time Before Visit; reviewing medical records (e.g. recent visits, labs, studies): 15 minutes During Visit (F2F time): 20 minutes After Visit (discussion with family or HCP, prescribing, ordering, referring, calling result/recommendations or documenting on same day): 15 minutes Total Visit Time: 50 minutes     Primary Contact: Extended Emergency Contact Information Primary Emergency Contact: ROGLE,YONG Address: [address] [place], [place] [address] [place] Phone: [phone] Mobile Phone: [phone] Relation: Son  Secondary Emergency Contact: Sinh,Cil Address: [place], [place] [address] [place] Phone: [phone] Work Phone: [phone] Mobile Phone: [phone] Relation: Daughter

## 2021-06-08 ENCOUNTER — Ambulatory Visit: Payer: Medicare Other | Admitting: Nurse Practitioner

## 2021-06-13 NOTE — Progress Notes (Signed)
 *  [place] interpreter used during entire encounter  SUBJECTIVE:   CHIEF COMPLAINT / HPI:   Omar Sanders is a 80 yo M who presents for the below. He was told by home nurse to come and get his diabetes, blood pressure, and cholesterol checked.   Diabetes Current Regimen: Diet controlled  Last A1c: 6.4 on 01/20/2021  Denies polyuria, polydipsia Last Eye Exam: Needs to schedule HLD Statin: Lipitor 40 mg daily HTN ACE/ARB: Was told by prior doctor to stop Lisinopril 5 mg daily because of his kidney function  Need for vaccination Would like prescription for tdap and shingrix today.   PERTINENT  PMH / PSH: HTN , HLD, atopic dermatitis  OBJECTIVE:   BP 138/81   Pulse 69   Ht '5\' 7"'$  (1.702 m)   Wt 182 lb 12.8 oz (82.9 kg)   SpO2 99%   BMI 28.63 kg/m   General: Appears well, no acute distress. Age appropriate. Cardiac: RRR, normal heart sounds, no murmurs Respiratory: CTAB, normal effort Extremities: No edema or cyanosis.  ASSESSMENT/PLAN:   1. Type 2 diabetes mellitus with chronic kidney disease, without long-term current use of insulin, unspecified CKD stage (HCC) A1c increased to 7.4 but still within goal <8. Considered adding farxiga 10 mg to patient regimen per pharmacy but it is not appropriate at this time but I will recheck kidney function. Patient states he was taken off of ACE due to impaired kidney function.  - HgB 123456 - Basic Metabolic Panel  2. Hypertension associated with diabetes ([place]) BP at goal. No medications at this time.  -F/u in 1 month  3. Hyperlipidemia associated with type 2 diabetes mellitus ([place]) Last lipid panel 07/14/2020 LDL 63. Patient would like to recheck lipid panel.  -F/u in 1 month  4. Need for vaccination - Tdap ([place]) 5-2.5-18.5 LF-MCG/0.5 injection; Inject 0.5 mLs into the muscle once for 1 dose.  Dispense: 0.5 mL; Refill: 0 - Zoster Vaccine Adjuvanted [facility]) injection; Inject 0.5 mLs into the muscle once for 1 dose.  Dispense:  0.5 mL; Refill: 0   Gerlene Fee, DO [place]

## 2021-06-15 ENCOUNTER — Other Ambulatory Visit: Payer: Self-pay

## 2021-06-15 ENCOUNTER — Ambulatory Visit (INDEPENDENT_AMBULATORY_CARE_PROVIDER_SITE_OTHER): Payer: Medicare Other | Admitting: Family Medicine

## 2021-06-15 VITALS — BP 138/81 | HR 69 | Ht 67.0 in | Wt 182.8 lb

## 2021-06-15 DIAGNOSIS — E1169 Type 2 diabetes mellitus with other specified complication: Secondary | ICD-10-CM | POA: Diagnosis not present

## 2021-06-15 DIAGNOSIS — I152 Hypertension secondary to endocrine disorders: Secondary | ICD-10-CM

## 2021-06-15 DIAGNOSIS — E785 Hyperlipidemia, unspecified: Secondary | ICD-10-CM

## 2021-06-15 DIAGNOSIS — E1159 Type 2 diabetes mellitus with other circulatory complications: Secondary | ICD-10-CM

## 2021-06-15 DIAGNOSIS — E1122 Type 2 diabetes mellitus with diabetic chronic kidney disease: Secondary | ICD-10-CM | POA: Diagnosis not present

## 2021-06-15 DIAGNOSIS — Z23 Encounter for immunization: Secondary | ICD-10-CM

## 2021-06-15 LAB — POCT GLYCOSYLATED HEMOGLOBIN (HGB A1C): HbA1c, POC (controlled diabetic range): 7.4 % — AB (ref 0.0–7.0)

## 2021-06-15 MED ORDER — TETANUS-DIPHTH-ACELL PERTUSSIS 5-2.5-18.5 LF-MCG/0.5 IM SUSP: 0.5000 mL | Freq: Once | INTRAMUSCULAR | 0 refills | Status: AC

## 2021-06-15 MED ORDER — SHINGRIX 50 MCG/0.5ML IM SUSR: 0.5000 mL | Freq: Once | INTRAMUSCULAR | 0 refills | Status: AC

## 2021-06-15 NOTE — Patient Instructions (Signed)
We discussed diabetes, blood pressure, and cholesterol. I will assess your kidney function today and we will follow up in 1 month. We discussed starting farxiga then. If blood pressure rises please call the office.   Dr. Janus Molder

## 2021-06-16 LAB — BASIC METABOLIC PANEL
BUN/Creatinine Ratio: 11 (ref 10–24)
BUN: 15 mg/dL (ref 8–27)
CO2: 25 mmol/L (ref 20–29)
Calcium: 8.9 mg/dL (ref 8.6–10.2)
Chloride: 100 mmol/L (ref 96–106)
Creatinine, Ser: 1.36 mg/dL — ABNORMAL HIGH (ref 0.76–1.27)
Glucose: 238 mg/dL — ABNORMAL HIGH (ref 65–99)
Potassium: 3.8 mmol/L (ref 3.5–5.2)
Sodium: 139 mmol/L (ref 134–144)
eGFR: 52 mL/min/{1.73_m2} — ABNORMAL LOW (ref >59)

## 2021-06-22 ENCOUNTER — Ambulatory Visit: Payer: Medicare Other | Admitting: Family Medicine

## 2021-06-23 ENCOUNTER — Encounter: Payer: Self-pay | Admitting: Family Medicine

## 2021-06-24 ENCOUNTER — Encounter: Payer: Self-pay | Admitting: Physician Assistant

## 2021-06-24 ENCOUNTER — Other Ambulatory Visit: Payer: Self-pay | Admitting: Family Medicine

## 2021-06-24 ENCOUNTER — Ambulatory Visit (INDEPENDENT_AMBULATORY_CARE_PROVIDER_SITE_OTHER): Payer: Medicare Other | Admitting: Physician Assistant

## 2021-06-24 ENCOUNTER — Ambulatory Visit: Payer: Medicare Other | Admitting: Nurse Practitioner

## 2021-06-24 VITALS — BP 94/54 | HR 60 | Ht 65.5 in | Wt 182.4 lb

## 2021-06-24 DIAGNOSIS — K5900 Constipation, unspecified: Secondary | ICD-10-CM | POA: Diagnosis not present

## 2021-06-24 DIAGNOSIS — R140 Abdominal distension (gaseous): Secondary | ICD-10-CM | POA: Diagnosis not present

## 2021-06-24 DIAGNOSIS — R1013 Epigastric pain: Secondary | ICD-10-CM | POA: Diagnosis not present

## 2021-06-24 DIAGNOSIS — K219 Gastro-esophageal reflux disease without esophagitis: Secondary | ICD-10-CM

## 2021-06-24 NOTE — Progress Notes (Signed)
Chief Complaint: Abdominal pain  HPI:    Omar Sanders is an 80 year old [place] male with a past medical history as listed below including chronic issues with abdominal bloating, constipation, GERD and IBS, assigned to Dr. Henrene Pastor, who returns to clinic today with abdominal pain.    2018 EGD with mild grade a esophagitis otherwise negative.    04/2020 colonoscopy due to a history of adenomatous polyps with 1 mm polyp removed which was a tubular adenoma.  No follow-up recommended due to age.    12/30/2020 patient seen in clinic by Nicoletta Ba, PA-C.  At that time discussed the patient had been given MiraLAX in the past for constipation but at the time of last visit he notes that he did not want to use that because he read he should not think chronic kidney disease.  He had been given a trial of Linzess which had increased his heartburn.  This was discontinued given a trial of Amitiza 24 mcg twice a day.  He stopped this because he is not convinced it was working and has been using Senokot but he is requiring increasing dosage 2 to 3 tablets a day to produce a bowel movement.  At that time wanted to try Cabometyx again.  Describes history of being given several PPIs in the past and not staying on any of them because became convinced that they were not helping.  He had symptoms of bloating, gas, belching and reflux with intermittent cough.  At that time started patient on Nexium 40 mg every morning and antireflux regimen.  For his chronic bloating recommended avoidance of dairy and artificial sweeteners as well as carbonates.  Discussed breath testing for SIBO but patient wanted to try an empiric course of Xifaxan 550 p.o. 3 times daily x14 days.  Also restarted Amitiza 24 mcg p.o. twice a day.  Explained that if patient had no improvement then would consider further imaging/possibly repeat EGD with Dr. Henrene Pastor.    Today, patient returns to clinic accompanied by interpreter.  He explains that he continues with the  same symptoms as above, describes bloating and belching and reflux with an intermittent cough which often keeps him awake at night.  Tells me he is using his Nexium 40 mg once a day with no change.  It is unclear to me if he actually used the Xifaxan prescribed for SIBO, as he says he did not feel any difference and stopped this medicine.  Also really unclear if he is still using his Amitiza.  At one point he says he was and then another point he says he is not.  Still some constipation.    Denies fever, chills or weight loss.  Past Medical History:  Diagnosis Date   Abdominal bloating 07/20/2020   Allergy    Anal fissure    Anemia    Anxiety    Arthritis    all over body    Benign prostatic hyperplasia    Bilateral lower extremity edema 03/22/2018   Cataract    removed bilat    Chest pain 02/2018   Chronic kidney disease    Chronic kidney disease, stage 3a ([place]) 01/19/2016   Chronic pain    Colon polyp    Diabetes mellitus without complication ([facility])    Falls 05/19/2021   GERD (gastroesophageal reflux disease)    Headache(784.0)    Hemorrhoids    Hepatic steatosis 05/19/2021   Hyperlipidemia    Hx: of   Hypertension    IBS (irritable bowel  syndrome)    Incontinent of urine    Hx:of   Migraines    Neuromuscular disorder (HCC)    periferal neuropathy per patient   New onset right bundle branch block (RBBB)    OSA (obstructive sleep apnea) 11/2009   sleep study with mild OSA, pt unwilling to use CPAP   Seizures ([facility])    pt states he has never had a seizure    Sleep apnea    mild    Type 2 diabetes mellitus with diabetic chronic kidney disease ([facility]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    Past Surgical History:  Procedure Laterality Date   CARDIAC CATHETERIZATION  06/04/2009   mild to moderate CAD  w/o hemodynamic significant lesion   CATARACT EXTRACTION     bilat   CHOLECYSTECTOMY  2008   COLONOSCOPY  2012   Dr Collene Mares   HEMORRHOID SURGERY     anal surgery   INGUINAL HERNIA REPAIR Bilateral 03/11/2013    Procedure: LAPAROSCOPIC BILATERAL INGUINAL HERNIA REPAIR;  Surgeon: Ralene Ok, MD;  Location: [facility];  Service: General;  Laterality: Bilateral;   INSERTION OF MESH Bilateral 03/11/2013   Procedure: INSERTION OF MESH;  Surgeon: Ralene Ok, MD;  Location: [facility];  Service: General;  Laterality: Bilateral;   NM MYOCAR PERF WALL MOTION  05/27/2009   mild iscemia mid inferio & apical regions.   NM MYOCAR PERF WALL MOTION  11/01/2012   negative for ischemia   POLYPECTOMY     TRANSURETHRAL RESECTION OF PROSTATE N/A 02/18/2021   Procedure: TRANSURETHRAL RESECTION OF THE PROSTATE (TURP), BIPOLAR;  Surgeon: Ceasar Mons, MD;  Location: [facility];  Service: Urology;  Laterality: N/A;   US ECHOCARDIOGRAPHY  05/27/2009   mild mitral annular ca+,AOV mildly sclerotic    Current Outpatient Medications  Medication Sig Dispense Refill   atorvastatin (LIPITOR) 40 MG tablet Take 1 tablet (40 mg total) by mouth daily. 90 tablet 3   bisacodyl (DULCOLAX) 5 MG EC tablet Take 5 mg by mouth daily as needed for moderate constipation.     cetirizine (ZYRTEC) 10 MG tablet Take 1 tablet (10 mg total) by mouth daily. 30 tablet 2   diclofenac Sodium (VOLTAREN) 1 % GEL Apply 2 g topically 4 (four) times daily.     esomeprazole (NEXIUM) 40 MG packet Take 40 mg by mouth daily before breakfast.     finasteride (PROSCAR) 5 MG tablet Take 5 mg by mouth daily.     fluticasone (FLONASE) 50 MCG/ACT nasal spray Place 2 sprays into both nostrils daily. (Patient taking differently: Place 2 sprays into both nostrils daily as needed for allergies.) 16 g 6   gabapentin (NEURONTIN) 100 MG capsule Take 1 capsule (100 mg total) by mouth 3 (three) times daily. 90 capsule 0   halobetasol (ULTRAVATE) 0.05 % cream Apply topically daily. 50 g 0   lisinopril (ZESTRIL) 5 MG tablet Take 1 tablet (5 mg total) by mouth at bedtime. 90 tablet 3   olopatadine (PATANOL) 0.1 % ophthalmic solution Place 1 drop into both eyes 2 (two)  times daily. (Patient taking differently: Place 1 drop into both eyes 2 (two) times daily as needed for allergies.) 5 mL 12   polyethylene glycol powder (GLYCOLAX/MIRALAX) 17 GM/SCOOP powder Take one scoop, 17g daily for constipation 500 g 0   sennosides-docusate sodium (SENOKOT-S) 8.6-50 MG tablet Take 2 tablets by mouth in the morning and at bedtime. 120 tablet 1   tamsulosin (FLOMAX) 0.4 MG CAPS capsule Take 0.4 mg by mouth.  torsemide (DEMADEX) 20 MG tablet Take 20 mg by mouth daily.     No current facility-administered medications for this visit.    Allergies as of 06/24/2021 - Review Complete 06/15/2021  Allergen Reaction Noted   Pantoprazole Other (See Comments) 05/16/2016   Aspirin Other (See Comments)    Lubiprostone Other (See Comments) 05/12/2016   Vitamin c Other (See Comments) 10/30/2012    Family History  Problem Relation Age of Onset   Hypertension Mother    Colon cancer Neg Hx    Colon polyps Neg Hx    Esophageal cancer Neg Hx    Rectal cancer Neg Hx    Stomach cancer Neg Hx     Social History   Socioeconomic History   Marital status: Widowed    Spouse name: Not on file   Number of children: 5   Years of education: 16   Highest education level: Bachelor's degree (e.g., BA, AB, BS)  Occupational History   Not on file  Tobacco Use   Smoking status: Never   Smokeless tobacco: Never  Vaping Use   Vaping Use: Never used  Substance and Sexual Activity   Alcohol use: No   Drug use: No   Sexual activity: Not Currently  Other Topics Concern   Not on file  Social History Narrative   Mr Lawrence lives with his son and daughter with the daughter's two children ages.  Two of his daughter's children are off in college (04/2021)   Mr Hommerding has two other children in [place].      Mr Savidge is by himself much of the day as his daughter works long day shifts.    Mr. Tardo cooks, does laundry for family, and housework.    Social Determinants of Health    Financial Resource Strain: Not on file  Food Insecurity: Not on file  Transportation Needs: Not on file  Physical Activity: Not on file  Stress: Not on file  Social Connections: Not on file  Intimate Partner Violence: Not on file    Review of Systems:    Constitutional: No weight loss, fever or chills Cardiovascular: No chest pain Respiratory: No SOB  Gastrointestinal: See HPI and otherwise negative   Physical Exam:  Vital signs: BP (!) 94/54 (BP Location: Left Arm, Patient Position: Sitting, Cuff Size: Normal)   Pulse 60   Ht 5' 5.5" (1.664 m)   Wt 182 lb 6 oz (82.7 kg)   BMI 29.89 kg/m    Constitutional:   Pleasant [place] male appears to be in NAD, Well developed, Well nourished, alert and cooperative Respiratory: Respirations even and unlabored. Lungs clear to auscultation bilaterally.   No wheezes, crackles, or rhonchi.  Cardiovascular: Normal S1, S2. No MRG. Regular rate and rhythm. No peripheral edema, cyanosis or pallor.  Gastrointestinal:  Soft, nondistended, mild epigastric ttp. No rebound or guarding. Normal bowel sounds. No appreciable masses or hepatomegaly. Rectal:  Not performed.  Psychiatric: Oriented to person, place and time. Demonstrates good judgement and reason without abnormal affect or behaviors.  RELEVANT LABS AND IMAGING: CBC    Component Value Date/Time   WBC 7.8 02/19/2021 0314   RBC 4.38 02/19/2021 0314   HGB 11.7 (L) 02/19/2021 0314   HGB 14.1 01/20/2020 1334   HCT 36.6 (L) 02/19/2021 0314   HCT 44.7 01/20/2020 1334   PLT 201 02/19/2021 0314   PLT 202 01/20/2020 1334   MCV 83.6 02/19/2021 0314   MCV 84 01/20/2020 1334   MCH 26.7 02/19/2021 0314  MCHC 32.0 02/19/2021 0314   RDW 14.1 02/19/2021 0314   RDW 17.4 (H) 01/20/2020 1334   LYMPHSABS 2.0 12/24/2019 1442   MONOABS 0.4 11/22/2019 1148   EOSABS 0.1 12/24/2019 1442   BASOSABS 0.0 12/24/2019 1442    CMP     Component Value Date/Time   NA 139 06/15/2021 1202   K 3.8  06/15/2021 1202   CL 100 06/15/2021 1202   CO2 25 06/15/2021 1202   GLUCOSE 238 (H) 06/15/2021 1202   GLUCOSE 107 (H) 02/18/2021 1100   BUN 15 06/15/2021 1202   CREATININE 1.36 (H) 06/15/2021 1202   CREATININE 1.55 (H) 07/31/2016 1502   CALCIUM 8.9 06/15/2021 1202   PROT 7.4 11/22/2019 1148   PROT 6.9 07/15/2019 1536   ALBUMIN 3.4 (L) 11/22/2019 1148   ALBUMIN 4.1 07/15/2019 1536   AST 29 11/22/2019 1148   ALT 25 11/22/2019 1148   ALKPHOS 52 11/22/2019 1148   BILITOT 0.9 11/22/2019 1148   BILITOT 0.4 07/15/2019 1536   GFRNONAA 54 (L) 02/18/2021 1100   GFRNONAA 43 (L) 07/31/2016 1502   GFRAA 55 (L) 08/23/2020 1129   GFRAA 49 (L) 07/31/2016 1502    Assessment: 1.  Epigastric pain, GERD, bloating: EGD in 2018 with mild esophagitis and otherwise normal, patient continues to symptoms regardless of PPI therapy (though it is really uncertain if he has been taking this or not), tried to treat patient for SIBO with Xifaxan (unclear if he actually finished this course of medicine or not); consider worsen gastritis+/-H. pylori +/-PUD 2.  Chronic constipation: Unchanged, not clear if patient is taking medications or not  Plan: 1.  Scheduled patient for an EGD in the [facility] with Dr. Henrene Pastor.  Did provide the patient a detailed list of risks for the procedure and he agrees to proceed. Patient is appropriate for endoscopic procedure(s) in the ambulatory ([facility]) setting. 2.  Discussed with patient that it is hard for [place] to treat him if he is not going to stay on any of his medications.  Oftentimes these medicines take 1 to 2 months to see if they are working rather than a few days.  I think often he just stops medicines when he does not feel an immediate change. 3.  Patient was made a follow-up with Dr. Henrene Pastor himself in the next 2 to 3 months after time of EGD so hopefully they can discuss his symptoms further.  Oftentimes he tells me his "doctor" said something different referring to Dr. Henrene Pastor, it would be  helpful for him to actually see Dr. Henrene Pastor.  Ellouise Newer, PA-C [facility] 06/24/2021, 9:10 AM  Cc: Gerlene Fee, DO

## 2021-06-24 NOTE — Patient Instructions (Signed)
You have been scheduled for an endoscopy. Please follow written instructions given to you at your visit today. If you use inhalers (even only as needed), please bring them with you on the day of your procedure.  If you are age 82 or older, your body mass index should be between 23-30. Your Body mass index is 29.89 kg/m. If this is out of the aforementioned range listed, please consider follow up with your Primary Care Provider.  If you are age 30 or younger, your body mass index should be between 19-25. Your Body mass index is 29.89 kg/m. If this is out of the aformentioned range listed, please consider follow up with your Primary Care Provider.   __________________________________________________________  The [facility] providers would like to encourage you to use [facility] to communicate with providers for non-urgent requests or questions.  Due to long hold times on the telephone, sending your provider a message by [facility] may be a faster and more efficient way to get a response.  Please allow 48 business hours for a response.  Please remember that this is for non-urgent requests.

## 2021-06-24 NOTE — Progress Notes (Signed)
Noted  

## 2021-06-25 ENCOUNTER — Other Ambulatory Visit: Payer: Self-pay | Admitting: Family Medicine

## 2021-06-28 ENCOUNTER — Telehealth: Payer: Self-pay | Admitting: Physician Assistant

## 2021-06-28 MED ORDER — ESOMEPRAZOLE MAGNESIUM 40 MG PO CPDR: DELAYED_RELEASE_CAPSULE | ORAL | 3 refills | Status: DC

## 2021-06-28 NOTE — Telephone Encounter (Signed)
Attempted to reach patient via interpreter services twice, no vm set up. His phone goes straight to vm.   I was able to reach patient w/o interpreter, he has been advised of recommendations. Pt requested that RX be sent to [facility] on file. Pt is aware that we have cancelled his EGD. Pt verbalized understanding and had no concerns at the end of the call.

## 2021-06-28 NOTE — Telephone Encounter (Signed)
RX sent to requested pharmacy. 

## 2021-06-28 NOTE — Telephone Encounter (Signed)
Spoke with patient, he would like to cancel EGD with Dr. Henrene Pastor. Pt would like to try medication for GERD instead. Please advise, thanks.

## 2021-06-28 NOTE — Telephone Encounter (Signed)
Patient called states he is wanting to cancel his EGD due to his family saying is does not need it due to his age and he is also asking for medications to help him with Jerrye Bushy.

## 2021-07-06 NOTE — Progress Notes (Addendum)
Video [place] interpreter used during entire encounter- Christian  SUBJECTIVE:   CHIEF COMPLAINT / HPI:   Mr. Zales is a 80 yo M who presents for the issues below.   Left shoulder pain States this has bothered him a long time. He mentions it today because it feels as if he is starting to get discomfort in his anterior chest, axilla, and up into the cervical region. He also has "twitching" cramps down both legs as well as numbness. He denies dyspnea but endorses anterior chest pain. He is also unable to brush his hair using this arm. He admits to receiving physical therapy in the past and this being helpful. He is agreeable to do PT but is unsure if he will be able to catch a ride there several days a week.   Skin rash Rash on forehead and neck. Admits to dealing with this for a long time. States he never received a cream for this issue and does not use anything on it. It is mildly pruritic.   Elevated BP at night Mentioned at the end of visit. Will check BP at night and will sometimes have systolic of [id number] but without symptoms.   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  Constitutional:      General: He is not in acute distress.    Appearance: Normal appearance. He is not ill-appearing, toxic-appearing or diaphoretic.  Cardiovascular:     Rate and Rhythm: Normal rate and regular rhythm.     Heart sounds: Normal heart sounds.  Pulmonary:     Effort: Pulmonary effort is normal.     Breath sounds: Normal breath sounds.  Musculoskeletal:     Comments: Left shoulder exam No swelling, ecchymoses.  No gross deformity. TTP globally. Decreased ROM  globally. Positive Hawkins, Neers. Strength 4/5 with empty can and resisted internal/external rotation. NV intact distally.   Skin:    Findings: Rash present.     Comments: Papular rash that is mildly erythematous to flesh colored over forehead. Neck appears unremarkable.   Neurological:     Mental Status: He is alert. Mental status is at baseline.   ASSESSMENT/PLAN:   1. Chronic left shoulder pain Hx of frozen shoulder, rotator cuff disorder.  Exam limited but consistent with this.  Last documented PT session was 08/2019.  Patient endorsing improvement with this.  Will refer to physical therapy again and offer supportive care with Voltaren gel, heat, ice. - Ambulatory referral to Physical Therapy  2. Dermatitis Chronic.  Intermittent.  Provider reviewed last provider's note 2 months prior Dr. McDiarmid addressed issue prescribing all halobetasol.  Patient states he never received cream.  We will represcribe having patient follow-up if no improvement. - halobetasol (ULTRAVATE) 0.05 % cream; Apply topically daily.  Dispense: 50 g; Refill: 0  3. Hyperlipidemia associated with type 2 diabetes mellitus [facility]) Patient desires updated cholesterol panel. - Lipid Panel  4. Idiopathic neuropathy Endorsing neuropathy today and it is not taking gabapentin.  Suggested allowing medication time to work if taking it as prescribed could also have some benefit with shoulder issue as well. - gabapentin (NEURONTIN)  100 MG capsule; Take 1 capsule (100 mg total) by mouth 3 (three) times daily.  Dispense: 90 capsule; Refill: 0  5. Chest discomfort Endorsed anterior chest discomfort with shoulder pain.  EKG without acute findings today.  And unchanged from last. - EKG 12-Lead  6. Hypertension associated with diabetes ([place]) Patient mentioned at end of visit that his pressure seems to rise at night and he checks it and systolics are usually around 150s.  Today he is normotensive recently taken off of  lisinopril due to declining kidney function.  Will not restart any medications today we will continue to monitor blood pressures at subsequent visits.  Plan is to follow-up in 2 to 3 months.    Gerlene Fee, [place]

## 2021-07-07 ENCOUNTER — Ambulatory Visit (HOSPITAL_COMMUNITY)
Admission: RE | Admit: 2021-07-07 | Discharge: 2021-07-07 | Disposition: A | Discharge status: Home / Self Care | Payer: Medicare Other | Source: Ambulatory Visit | Attending: Family Medicine | Admitting: Family Medicine

## 2021-07-07 ENCOUNTER — Ambulatory Visit (INDEPENDENT_AMBULATORY_CARE_PROVIDER_SITE_OTHER): Payer: Medicare Other | Admitting: Family Medicine

## 2021-07-07 ENCOUNTER — Other Ambulatory Visit: Payer: Self-pay

## 2021-07-07 ENCOUNTER — Encounter: Payer: Self-pay | Admitting: Family Medicine

## 2021-07-07 VITALS — BP 125/80 | HR 64 | Ht 67.0 in | Wt 181.0 lb

## 2021-07-07 DIAGNOSIS — M25512 Pain in left shoulder: Secondary | ICD-10-CM

## 2021-07-07 DIAGNOSIS — R0789 Other chest pain: Secondary | ICD-10-CM | POA: Insufficient documentation

## 2021-07-07 DIAGNOSIS — L309 Dermatitis, unspecified: Secondary | ICD-10-CM | POA: Diagnosis not present

## 2021-07-07 DIAGNOSIS — G609 Hereditary and idiopathic neuropathy, unspecified: Secondary | ICD-10-CM | POA: Diagnosis not present

## 2021-07-07 DIAGNOSIS — G8929 Other chronic pain: Secondary | ICD-10-CM | POA: Diagnosis not present

## 2021-07-07 DIAGNOSIS — E1169 Type 2 diabetes mellitus with other specified complication: Secondary | ICD-10-CM

## 2021-07-07 DIAGNOSIS — E1159 Type 2 diabetes mellitus with other circulatory complications: Secondary | ICD-10-CM | POA: Diagnosis not present

## 2021-07-07 DIAGNOSIS — E785 Hyperlipidemia, unspecified: Secondary | ICD-10-CM

## 2021-07-07 DIAGNOSIS — I152 Hypertension secondary to endocrine disorders: Secondary | ICD-10-CM | POA: Diagnosis not present

## 2021-07-07 MED ORDER — HALOBETASOL PROPIONATE 0.05 % EX CREA: TOPICAL_CREAM | Freq: Every day | CUTANEOUS | 0 refills | Status: DC

## 2021-07-07 MED ORDER — GABAPENTIN 100 MG PO CAPS: 100.0000 mg | ORAL_CAPSULE | Freq: Three times a day (TID) | ORAL | 0 refills | Status: DC

## 2021-07-07 NOTE — Patient Instructions (Addendum)
C?m ?n b?n ? ??n ngy hm nay. Hm nay chng ti ? gi?i quy?t v?n ?? pht ban d??ng nh? l m?t s? kch ?ng da.  B?n ???c k toa m?t lo?i kem m b?n khng nh?n ???c ti s? k toa ci ny.  Vui lng theo di n?u khng c c?i thi?n. ??i v?i ?au vai tri, ?y l m?t v?n ?? mn tnh, chng ti ? quy?t ??nh gi?i thi?u b?n ??n v?t l tr? li?u m?t l?n n?a. B?n c?ng c th? s? d?ng gel voltaren, ?un nng tr??c khi ho?t ??ng v ? sau ? ?? gi?m ?au.  Vui lng theo di sau 2 ??n 3 thng.  Thank you for coming in today. Today we addressed the rash which appears to be some skin irritation.  You are prescribed a cream that you did not receive I will prescribe this.  Please follow-up if no improvement. For the left shoulder pain this is a chronic issue we have decided to refer you to physical therapy again. You can also use voltaren gel, heat prior to activity and ice after for relief.  Please follow-up in 2 to 3 months.  Dr. Janus Molder

## 2021-07-08 LAB — LIPID PANEL
Chol/HDL Ratio: 4.1 ratio (ref 0.0–5.0)
Cholesterol, Total: 131 mg/dL (ref 100–199)
HDL: 32 mg/dL — ABNORMAL LOW (ref >39)
LDL Chol Calc (NIH): 69 mg/dL (ref 0–99)
Triglycerides: 179 mg/dL — ABNORMAL HIGH (ref 0–149)
VLDL Cholesterol Cal: 30 mg/dL (ref 5–40)

## 2021-07-11 ENCOUNTER — Encounter: Payer: Medicare Other | Admitting: Internal Medicine

## 2021-07-11 ENCOUNTER — Encounter: Payer: Self-pay | Admitting: Family Medicine

## 2021-07-20 ENCOUNTER — Telehealth: Payer: Self-pay

## 2021-07-20 NOTE — Telephone Encounter (Signed)
Pt called asking to speak with his Dr.. Pt states needs to talk about his health. I asked if he would like to schedule an appt and he said no, have the nurse or Dr give me a call. [phone].

## 2021-07-22 DIAGNOSIS — R3912 Poor urinary stream: Secondary | ICD-10-CM | POA: Diagnosis not present

## 2021-07-22 NOTE — Telephone Encounter (Signed)
Attempted to call patient with [place] interpreter [phone]. No answer. Voicemail box is not set up. Unable to leave a message. I am currently on night float. If he calls back please get more information from him. I will attempt to call back on a later date. Thank you.   Gerlene Fee, DO 07/22/2021, 8:11 PM PGY-3, [place]

## 2021-07-25 ENCOUNTER — Telehealth: Payer: Self-pay | Admitting: Family Medicine

## 2021-07-25 NOTE — Telephone Encounter (Signed)
Called patient with vietnamese interpreter [phone]. Second attempt. First 9/23. No answer. Voicemail box is not set up. Unable to leave message. Will wait for patient to contact office to discuss his concerns.   Jenette Rayson Autry-Lott, DO 07/25/2021, 8:49 PM PGY-3, [place]

## 2021-08-09 ENCOUNTER — Other Ambulatory Visit: Payer: Self-pay | Admitting: Family Medicine

## 2021-08-09 DIAGNOSIS — G609 Hereditary and idiopathic neuropathy, unspecified: Secondary | ICD-10-CM

## 2021-08-11 ENCOUNTER — Telehealth: Payer: Self-pay | Admitting: *Deleted

## 2021-08-11 DIAGNOSIS — Z5982 Transportation insecurity: Secondary | ICD-10-CM

## 2021-08-11 NOTE — Telephone Encounter (Signed)
Patient called to get transportation set up with [facility].  Will forward to MD to let her know that I ordered and sent referral.  [facility]

## 2021-08-12 ENCOUNTER — Telehealth: Payer: Self-pay | Admitting: *Deleted

## 2021-08-12 NOTE — Telephone Encounter (Signed)
   Telephone encounter was:  Unsuccessful.  08/12/2021 Name: AYCE PIETRZYK MRN: [id number] DOB: Apr 19, 1941  Unsuccessful outbound call made today to assist with:  Transportation Needs   Outreach Attempt:  1st Attempt  Mailbox not set up  [place], Care Management  [phone] [address] , [place] [address] Email : [email or web address]

## 2021-08-15 ENCOUNTER — Telehealth: Payer: Self-pay

## 2021-08-15 ENCOUNTER — Telehealth: Payer: Self-pay | Admitting: *Deleted

## 2021-08-15 NOTE — Telephone Encounter (Signed)
   Telephone encounter was:  Unsuccessful.  08/15/2021 Name: Omar Sanders MRN: [id number] DOB: 02-06-1941  Unsuccessful outbound call made today to assist with:  Transportation Needs   Outreach Attempt:  2nd Attempt  A HIPAA compliant voice message was left requesting a return call.  Instructed patient to call back at   Instructed patient to call back at [phone]  at their earliest convenience. .  [place], Care Management  [phone] [address] , [place] [address] Email : [email or web address]

## 2021-08-15 NOTE — Telephone Encounter (Signed)
   Telephone encounter was:  Successful.  08/15/2021 Name: LATROY GAYMON MRN: [id number] DOB: 1941/04/20  COVE HAYDON is a 80 y.o. year old male who is a primary care patient of [place], Naaman Plummer, DO . The community resource team was consulted for assistance with Transportation Needs   Care guide performed the following interventions: Patient provided with information about care guide support team and interviewed to confirm resource needs.  Follow Up Plan:  No further follow up planned at this time. The patient has been provided with needed resources.  [place], Care Management  [phone] [address] [place] , [place] [address] Email : [email or web address]

## 2021-08-15 NOTE — Telephone Encounter (Signed)
Patient calls nurse line stating he needs help with transportation. Patient advised he has been referred to [facility] and they have attempted to connect with the twice. Patient given number to followup.

## 2021-08-19 ENCOUNTER — Other Ambulatory Visit: Payer: Self-pay

## 2021-08-19 ENCOUNTER — Ambulatory Visit: Payer: Medicare Other | Attending: Family Medicine

## 2021-08-19 DIAGNOSIS — M25612 Stiffness of left shoulder, not elsewhere classified: Secondary | ICD-10-CM | POA: Diagnosis not present

## 2021-08-19 DIAGNOSIS — M6281 Muscle weakness (generalized): Secondary | ICD-10-CM | POA: Diagnosis not present

## 2021-08-19 DIAGNOSIS — G8929 Other chronic pain: Secondary | ICD-10-CM

## 2021-08-19 DIAGNOSIS — M25512 Pain in left shoulder: Secondary | ICD-10-CM | POA: Diagnosis not present

## 2021-08-19 NOTE — Patient Instructions (Signed)
  [id number]

## 2021-08-19 NOTE — Therapy (Signed)
[place], [place], [address] Phone: [phone]   Fax:  [phone]  Physical Therapy Evaluation  Patient Details  Name: Omar Sanders MRN: [id number] Date of Birth: 02/19/1941 Referring Provider (PT): Martyn Malay, MD   Encounter Date: 08/19/2021   PT End of Session - 08/19/21 1302     Visit Number 1    Number of Visits 17    Date for PT Re-Evaluation 10/14/21    Authorization Type UHC MCR and MCD    Authorization Time Period FOTO v6, v10    Progress Note Due on Visit 10    PT Start Time 0915    PT Stop Time 1000    PT Time Calculation (min) 45 min    Activity Tolerance Patient tolerated treatment well    Behavior During Therapy [facility] for tasks assessed/performed             Past Medical History:  Diagnosis Date   Abdominal bloating 07/20/2020   Allergy    Anal fissure    Anemia    Anxiety    Arthritis    all over body    Benign prostatic hyperplasia    Bilateral lower extremity edema 03/22/2018   Cataract    removed bilat    Chest pain 02/2018   Chronic kidney disease    Chronic kidney disease, stage 3a ([place]) 01/19/2016   Chronic pain    Colon polyp    Diabetes mellitus without complication ([place])    Falls 05/19/2021   GERD (gastroesophageal reflux disease)    Headache(784.0)    Hemorrhoids    Hepatic steatosis 05/19/2021   Hyperlipidemia    Hx: of   Hypertension    IBS (irritable bowel syndrome)    Incontinent of urine    Hx:of   Migraines    Neuromuscular disorder ([place])    periferal neuropathy per patient   New onset right bundle branch block (RBBB)    OSA (obstructive sleep apnea) 11/2009   sleep study with mild OSA, pt unwilling to use CPAP   Seizures ([place])    pt states he has never had a seizure    Sleep apnea    mild    Type 2 diabetes mellitus with diabetic chronic kidney disease ([place]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    Past Surgical History:  Procedure Laterality Date   CARDIAC  CATHETERIZATION  06/04/2009   mild to moderate CAD  w/o hemodynamic significant lesion   CATARACT EXTRACTION     bilat   CHOLECYSTECTOMY  2008   COLONOSCOPY  2012   Dr Collene Mares   HEMORRHOID SURGERY     anal surgery   INGUINAL HERNIA REPAIR Bilateral 03/11/2013   Procedure: LAPAROSCOPIC BILATERAL INGUINAL HERNIA REPAIR;  Surgeon: Ralene Ok, MD;  Location: [place];  Service: General;  Laterality: Bilateral;   INSERTION OF MESH Bilateral 03/11/2013   Procedure: INSERTION OF MESH;  Surgeon: Ralene Ok, MD;  Location: [place];  Service: General;  Laterality: Bilateral;   NM MYOCAR PERF WALL MOTION  05/27/2009   mild iscemia mid inferio & apical regions.   NM MYOCAR PERF WALL MOTION  11/01/2012   negative for ischemia   POLYPECTOMY     TRANSURETHRAL RESECTION OF PROSTATE N/A 02/18/2021   Procedure: TRANSURETHRAL RESECTION OF THE PROSTATE (TURP), BIPOLAR;  Surgeon: Ceasar Mons, MD;  Location[facility];  Service: Urology;  Laterality: N/A;   US ECHOCARDIOGRAPHY  05/27/2009   mild mitral annular ca+,AOV mildly sclerotic  There were no vitals filed for this visit.    Subjective Assessment - 08/19/21 0927     Subjective Pt reports primary c/o chronic L shoulder pain of insidious onset which began several year ago. The pain began at the lateral shoulder and over time referred pain to his lateral upper arm and shoulder blade and more currently to the lateral neck/ upper trap. He currently is also experiencing R posterior neck pain only lasting a few days. He is unable to lift his L UE overhead due to pain. He also reports onset of L UE N/T lasting a few years. Pt denies any unexplained weight change, nausea/ vomiting, or unrelenting night pain. However, he does report report severe pain when laying on the affected shoulder. He has experienced urinary retention due to prostate enlargement, although this has been and is currently being addressed by his PCM. He experiences N/T in hands and feet  related to DMII in addition to his more recent onset of L UE N/T. Current pain is 4-5/10; Worst pain: over 10/10; Best is 0/10.    Patient is accompained by: Interpreter   Omar Sanders   Limitations Lifting;House hold activities    How long can you sit comfortably? Unlimited    How long can you stand comfortably? Unlimited    How long can you walk comfortably? Unlimited    Patient Stated Goals Reduce pain, return to performing household tasks, yardwork    Currently in Pain? Yes    Pain Score 5     Pain Location Shoulder    Pain Orientation Left    Pain Descriptors / Indicators Sharp;Dull    Pain Type Chronic pain    Pain Radiating Towards Lateral upper arm, L hand    Pain Onset More than a month ago    Pain Frequency Constant    Aggravating Factors  Reaching overhead    Pain Relieving Factors Rest, Voltaren    Effect of Pain on Daily Activities See goals                [facility] PT Assessment - 08/19/21 0001       Assessment   Medical Diagnosis Chronic left shoulder pain (M25.512, G89.29)    Referring Provider (PT) Martyn Malay, MD    Onset Date/Surgical Date 08/20/19    Hand Dominance Right      Precautions   Precautions None      Restrictions   Weight Bearing Restrictions No      Balance Screen   Has the patient fallen in the past 6 months No    Has the patient had a decrease in activity level because of a fear of falling?  No    Is the patient reluctant to leave their home because of a fear of falling?  No      Prior Function   Vocation Retired      Charity fundraiser Status Within Functional Limits for tasks assessed      Observation/Other Assessments   Focus on Therapeutic Outcomes (FOTO)  53%, projected 64% in 11 visits      AROM   Right Shoulder Flexion --   WNL   Right Shoulder ABduction --   WNL   Right Shoulder Internal Rotation --   WNL   Right Shoulder External Rotation --   WNL   Left Shoulder Flexion 90 Degrees   pain   Left  Shoulder ABduction 90 Degrees   pain   Left Shoulder Internal  Rotation 20 Degrees    Left Shoulder External Rotation 50 Degrees      Strength   Right Shoulder Flexion 5/5    Right Shoulder ABduction 5/5    Right Shoulder Internal Rotation 5/5    Right Shoulder External Rotation 5/5    Left Shoulder Flexion 3-/5   pain   Left Shoulder ABduction 3-/5   pain   Left Shoulder Internal Rotation 4/5    Left Shoulder External Rotation 3-/5   painful     Neer Impingement test    Findings Positive    Side Left      Hawkins-Kennedy test   Findings Positive    Side Left      Lag time at 0 degrees   Findings Negative    Side Left      Lag signs at 90 degrees    Findings Negative    Side Left      Drop Arm test   Findings Negative    Side Left                        Objective measurements completed on examination: See above findings.                PT Education - 08/19/21 1301     Education Details Discussed probable underlying pathophysiology behind the pt's pain presentation, POC, and HEP    Person(s) Educated Patient    Methods Explanation;Demonstration;Handout    Comprehension Verbalized understanding;Returned demonstration              PT Short Term Goals - 08/19/21 1315       PT SHORT TERM GOAL #1   Title Pt will report understanding and adherence to his HEP in order to promote independence in the management of his primary impairments.    Baseline HEP provided at eval    Time 4    Period Weeks    Status New    Target Date 09/16/21               PT Long Term Goals - 08/19/21 1316       PT LONG TERM GOAL #1   Title Pt will achieve a FOTO score of 64% or higher in order to demonstrate improved functional ability as it relates to his shoulder impairments.    Baseline 53%    Time 8    Period Weeks    Status New    Target Date 10/14/21      PT LONG TERM GOAL #2   Title Pt will achieve L shoulder flexion and abduction AROM  of 150 degrees or higher in order to reach into overhead cabinets without limitation.    Baseline 90 degrees, limited by pain    Time 8    Period Weeks    Status New    Target Date 10/14/21      PT LONG TERM GOAL #3   Title Pt will achieve global L shoulder strength of 4+/5 or greater in order to perform heavy yardwork such as raking without limitation.    Baseline See flowsheet    Time 8    Period Weeks    Status New    Target Date 10/14/21      PT LONG TERM GOAL #4   Title Pt will demonstrate ability to lift 5-pound weight overhead in order to perform heavy household chores without limitation.    Baseline Pt reports inability to lift 5 pounds  overhead with his L UE.    Time 8    Period Weeks    Status New    Target Date 10/14/21                    Plan - 08/19/21 1303     Clinical Impression Statement Pt is a pleasant 80yo M who presents with primary c/o chronic L shoulder pain of insidious onset and difficulty reaching overhead. Objective assessment was limited due to slow progression of session due to language barrier. Upon assessment, his primary impairments include painful and limited L shoulder flexion, abduction, and external rotation AROM, weakness and pain with resisted L shoulder flexion, abduction, and external rotation, and limited functional ability according to FOTO score.  Ruling up partial thickness L shoulder rotator cuff (RTC) pathology due to weak and painful L shoulder ER, flexion, and abduction, inability to reach overhead due to pain, and positive Hawkins-Kennedy and Neer's special testing. Low concern of full-thickness tear due to negative findings with ERLS and drop arm special testing. Further assessment of cervical spine is needed to rule in/ out cervical radiculopathy as a contributing factor to the pt's pain/ symptom presentation. The pt will benefit from skilled PT to address his primary impairments and return to his prior level of function with less  limitation.    Personal Factors and Comorbidities Age;Comorbidity 3+    Comorbidities See medical Hx    Examination-Activity Limitations Lift;Sleep;Carry;Reach Overhead    Examination-Participation Restrictions Cleaning;Meal Prep;Yard Work    Stability/Clinical Decision Making Stable/Uncomplicated    Designer, jewellery Low    Rehab Potential Good    PT Frequency 2x / week    PT Duration 8 weeks    PT Treatment/Interventions ADLs/Self Care Home Management;Moist Heat;Therapeutic activities;Therapeutic exercise;Neuromuscular re-education;Manual techniques;Dry needling;Passive range of motion;Patient/family education;Taping    PT Next Visit Plan Assess cervical spine to rule out cervical radiculopathy; progress parascapular/ RTC strengthening    PT Home Exercise Plan [id number]    Consulted and Agree with Plan of Care Patient             Patient will benefit from skilled therapeutic intervention in order to improve the following deficits and impairments:  Decreased range of motion, Impaired UE functional use, Pain, Decreased mobility, Decreased strength  Visit Diagnosis: Chronic left shoulder pain  Stiffness of left shoulder, not elsewhere classified  Muscle weakness (generalized)     Problem List Patient Active Problem List   Diagnosis Date Noted   Age-related cognitive decline 05/20/2021   Tinnitus aurium, 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   Abnormal hearing screen 05/20/2021   Failed vision screen 05/20/2021   Falls 05/19/2021   Hepatic steatosis 05/19/2021   Vitamin D insufficiency    Foley catheter in place 03/04/2021   BPH with obstruction/lower urinary tract symptoms 02/18/2021   Atopic dermatitis 03/04/2020   Rotator cuff disorder, left 07/28/2019   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   Aortic aneurysm ([place]) 03/26/2018   Type 2 diabetes mellitus with diabetic chronic kidney disease ([place]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   Constipation 06/01/2017   Hyperlipidemia associated with type 2  diabetes mellitus ([place]) 06/14/2016   Dizziness 02/14/2016   Chronic kidney disease, stage 3a ([place]) 01/19/2016   BPH (benign prostatic hyperplasia) 05/06/2015   Generalized anxiety disorder 12/29/2014   GERD 05/19/2010   Irritable bowel syndrome with constipation 05/19/2010   PERSONAL HX COLONIC POLYPS 05/19/2010   Hypertension associated with diabetes ([place]) 04/10/2007   Seasonal allergies 04/10/2007   HIATAL HERNIA 04/10/2007  Vanessa Sharpsburg, PT, DPT 08/19/21 1:21 PM   [place] [facility] [address] [place], [place], [address] Phone: [phone]   Fax:  [phone]  Name: Omar Sanders MRN: [id number] Date of Birth: [birth year]

## 2021-08-23 ENCOUNTER — Telehealth: Payer: Self-pay | Admitting: *Deleted

## 2021-08-23 ENCOUNTER — Ambulatory Visit: Payer: Medicare Other

## 2021-08-23 NOTE — Telephone Encounter (Signed)
   Telephone encounter was:  Successful.  08/23/2021 Name: Omar Sanders MRN: [id number] DOB: 08-16-1941  Omar Sanders is a 80 y.o. year old male who is a primary care patient of [place], Naaman Plummer, DO . The community resource team was consulted for assistance with Transportation Needs   Care guide performed the following interventions: Patient provided with information about care guide support team and interviewed to confirm resource needs Follow up call placed to community resources to determine status of patients referral.  Follow Up Plan:  No further follow up planned at this time. The patient has been provided with needed resources.  [place], Care Management  [phone] [address] , [place] [address] Email : [email or web address]

## 2021-08-24 ENCOUNTER — Ambulatory Visit: Payer: Medicare Other

## 2021-08-24 ENCOUNTER — Telehealth: Payer: Self-pay | Admitting: *Deleted

## 2021-08-24 NOTE — Telephone Encounter (Signed)
   Telephone encounter was:  Successful.  08/24/2021 Name: PARLEY PIDCOCK MRN: [id number] DOB: December 07, 1940  MCKENNON ZWART is a 80 y.o. year old male who is a primary care patient of [place], Naaman Plummer, DO . The community resource team was consulted for assistance with Transportation Needs patient called again about Transportaion had to cancel this am appt for flu shot and was confused that his other appts would be canceled so hecalled and we 3 way called [employer] transportation to confirm upcoming 3 appts   Care guide performed the following interventions: Patient provided with information about care guide support team and interviewed to confirm resource needs Follow up call placed to community resources to determine status of patients referral.  Follow Up Plan:  No further follow up planned at this time. The patient has been provided with needed resources. [place], Care Management  [phone] [address] , [place] [address] Email : [email or web address]

## 2021-08-25 ENCOUNTER — Encounter: Payer: Self-pay | Admitting: Physical Therapy

## 2021-08-25 ENCOUNTER — Ambulatory Visit: Payer: Medicare Other | Admitting: Physical Therapy

## 2021-08-25 ENCOUNTER — Other Ambulatory Visit: Payer: Self-pay

## 2021-08-25 DIAGNOSIS — M25512 Pain in left shoulder: Secondary | ICD-10-CM

## 2021-08-25 DIAGNOSIS — M6281 Muscle weakness (generalized): Secondary | ICD-10-CM

## 2021-08-25 DIAGNOSIS — G8929 Other chronic pain: Secondary | ICD-10-CM

## 2021-08-25 DIAGNOSIS — M25612 Stiffness of left shoulder, not elsewhere classified: Secondary | ICD-10-CM | POA: Diagnosis not present

## 2021-08-25 NOTE — Therapy (Addendum)
Omar Sanders, [place], [address] Phone: [phone]   Fax:  [phone]  Physical Therapy Treatment  Patient Details  Name: Omar Sanders MRN: [id number] Date of Birth: 12/05/1940 Referring Provider (PT): Martyn Malay, MD   Encounter Date: 08/25/2021   PT End of Session - 08/25/21 0958     Visit Number 2    Number of Visits 17    Date for PT Re-Evaluation 10/14/21    Authorization Type UHC MCR and MCD    Authorization Time Period FOTO v6, v10    Progress Note Due on Visit 10    Activity Tolerance Patient tolerated treatment well    Behavior During Therapy [facility] for tasks assessed/performed           Time in: 10:00 am Time out: 10:45 am Total time: 45 min  Past Medical History:  Diagnosis Date   Abdominal bloating 07/20/2020   Allergy    Anal fissure    Anemia    Anxiety    Arthritis    all over body    Benign prostatic hyperplasia    Bilateral lower extremity edema 03/22/2018   Cataract    removed bilat    Chest pain 02/2018   Chronic kidney disease    Chronic kidney disease, stage 3a ([place]) 01/19/2016   Chronic pain    Colon polyp    Diabetes mellitus without complication ([place])    Falls 05/19/2021   GERD (gastroesophageal reflux disease)    Headache(784.0)    Hemorrhoids    Hepatic steatosis 05/19/2021   Hyperlipidemia    Hx: of   Hypertension    IBS (irritable bowel syndrome)    Incontinent of urine    Hx:of   Migraines    Neuromuscular disorder ([place])    periferal neuropathy per patient   New onset right bundle branch block (RBBB)    OSA (obstructive sleep apnea) 11/2009   sleep study with mild OSA, pt unwilling to use CPAP   Seizures ([place])    pt states he has never had a seizure    Sleep apnea    mild    Type 2 diabetes mellitus with diabetic chronic kidney disease ([place]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    Past Surgical History:  Procedure Laterality Date   CARDIAC CATHETERIZATION  06/04/2009   mild  to moderate CAD  w/o hemodynamic significant lesion   CATARACT EXTRACTION     bilat   CHOLECYSTECTOMY  2008   COLONOSCOPY  2012   Dr Collene Mares   HEMORRHOID SURGERY     anal surgery   INGUINAL HERNIA REPAIR Bilateral 03/11/2013   Procedure: LAPAROSCOPIC BILATERAL INGUINAL HERNIA REPAIR;  Surgeon: Ralene Ok, MD;  Location: [place];  Service: General;  Laterality: Bilateral;   INSERTION OF MESH Bilateral 03/11/2013   Procedure: INSERTION OF MESH;  Surgeon: Ralene Ok, MD;  Location: [place];  Service: General;  Laterality: Bilateral;   NM MYOCAR PERF WALL MOTION  05/27/2009   mild iscemia mid inferio & apical regions.   NM MYOCAR PERF WALL MOTION  11/01/2012   negative for ischemia   POLYPECTOMY     TRANSURETHRAL RESECTION OF PROSTATE N/A 02/18/2021   Procedure: TRANSURETHRAL RESECTION OF THE PROSTATE (TURP), BIPOLAR;  Surgeon: Ceasar Mons, MD;  [facility];  Service: Urology;  Laterality: N/A;   US ECHOCARDIOGRAPHY  05/27/2009   mild mitral annular ca+,AOV mildly sclerotic    There were no vitals filed for this visit.  Subjective Assessment - 08/25/21 1003     Subjective Pt reports that he has been doing some of the exercises at home.  He feels ok at rest, but it hurts more when he moves it.  Pain is 2-3 at rest and 6-7/10 with movement.    Patient is accompained by: Interpreter   [name]   Limitations Lifting;House hold activities    How long can you sit comfortably? Unlimited    How long can you stand comfortably? Unlimited    How long can you walk comfortably? Unlimited    Patient Stated Goals Reduce pain, return to performing household tasks, yardwork    Pain Onset More than a month ago            [facility] Adult PT Treatment/Exercise:  Therapeutic Exercise: - scapular retraction - 20x - bil ER with YTB in sitting - 4x10 - Seated horizontal abd - 4x10 - YTB  Manual Therapy: - AP mobs GII-III - manual pin and stretch for biceps and pec major - manual  stretch of pec minor, pt in supine with towel roll under Tx spine    PT Short Term Goals - 08/19/21 1315       PT SHORT TERM GOAL #1   Title Pt will report understanding and adherence to his HEP in order to promote independence in the management of his primary impairments.    Baseline HEP provided at eval    Time 4    Period Weeks    Status New    Target Date 09/16/21               PT Long Term Goals - 08/19/21 1316       PT LONG TERM GOAL #1   Title Pt will achieve a FOTO score of 64% or higher in order to demonstrate improved functional ability as it relates to his shoulder impairments.    Baseline 53%    Time 8    Period Weeks    Status New    Target Date 10/14/21      PT LONG TERM GOAL #2   Title Pt will achieve L shoulder flexion and abduction AROM of 150 degrees or higher in order to reach into overhead cabinets without limitation.    Baseline 90 degrees, limited by pain    Time 8    Period Weeks    Status New    Target Date 10/14/21      PT LONG TERM GOAL #3   Title Pt will achieve global L shoulder strength of 4+/5 or greater in order to perform heavy yardwork such as raking without limitation.    Baseline See flowsheet    Time 8    Period Weeks    Status New    Target Date 10/14/21      PT LONG TERM GOAL #4   Title Pt will demonstrate ability to lift 5-pound weight overhead in order to perform heavy household chores without limitation.    Baseline Pt reports inability to lift 5 pounds overhead with his L UE.    Time 8    Period Weeks    Status New    Target Date 10/14/21                   Plan - 08/25/21 1040     Clinical Impression Statement Pt reports no increase in baseline pain following therapy  HEP was reviewed, but left unchanged    Overall, [name] is  progressing well with therapy.  Today we concentrated on rotator cuff strengthening and periscapular strengthening.  Pt with scapular protraction and GH IR at rest.  Pt cued  significantly to minimize excessive protraction during therex.  Pt will continue to benefit from skilled physical therapy to address remaining deficits and achieve listed goals.  Continue per POC.    Personal Factors and Comorbidities Age;Comorbidity 3+    Comorbidities See medical Hx    Examination-Activity Limitations Lift;Sleep;Carry;Reach Overhead    Examination-Participation Restrictions Cleaning;Meal Prep;Yard Work    Stability/Clinical Decision Making Stable/Uncomplicated    Rehab Potential Good    PT Frequency 2x / week    PT Duration 8 weeks    PT Treatment/Interventions ADLs/Self Care Home Management;Moist Heat;Therapeutic activities;Therapeutic exercise;Neuromuscular re-education;Manual techniques;Dry needling;Passive range of motion;Patient/family education;Taping    PT Next Visit Plan Assess cervical spine to rule out cervical radiculopathy; progress parascapular/ RTC strengthening    PT Home Exercise Plan [id number]    Consulted and Agree with Plan of Care Patient             Patient will benefit from skilled therapeutic intervention in order to improve the following deficits and impairments:  Decreased range of motion, Impaired UE functional use, Pain, Decreased mobility, Decreased strength  Visit Diagnosis: Chronic left shoulder pain  Stiffness of left shoulder, not elsewhere classified  Muscle weakness (generalized)     Problem List Patient Active Problem List   Diagnosis Date Noted   Age-related cognitive decline 05/20/2021   Tinnitus aurium, 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   Abnormal hearing screen 05/20/2021   Failed vision screen 05/20/2021   Falls 05/19/2021   Hepatic steatosis 05/19/2021   Vitamin D insufficiency    Foley catheter in place 03/04/2021   BPH with obstruction/lower urinary tract symptoms 02/18/2021   Atopic dermatitis 03/04/2020   Rotator cuff disorder, left 07/28/2019   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   Aortic aneurysm ([place]) 03/26/2018    Type 2 diabetes mellitus with diabetic chronic kidney disease ([place]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   Constipation 06/01/2017   Hyperlipidemia associated with type 2 diabetes mellitus ([place]) 06/14/2016   Dizziness 02/14/2016   Chronic kidney disease, stage 3a ([place]) 01/19/2016   BPH (benign prostatic hyperplasia) 05/06/2015   Generalized anxiety disorder 12/29/2014   GERD 05/19/2010   Irritable bowel syndrome with constipation 05/19/2010   PERSONAL HX COLONIC POLYPS 05/19/2010   Hypertension associated with diabetes ([place]) 04/10/2007   Seasonal allergies 04/10/2007   HIATAL HERNIA 04/10/2007    Omar Sanders, PT 08/25/2021, 10:48 AM  [place] [facility] [address] [place], [place], [address] Phone: [phone]   Fax:  [phone]  Name: Omar Sanders MRN: [id number] Date of Birth: [birth year]

## 2021-08-30 ENCOUNTER — Ambulatory Visit: Payer: Medicare Other | Attending: Family Medicine | Admitting: Physical Therapy

## 2021-08-30 ENCOUNTER — Encounter: Payer: Self-pay | Admitting: Physical Therapy

## 2021-08-30 ENCOUNTER — Other Ambulatory Visit: Payer: Self-pay

## 2021-08-30 DIAGNOSIS — M6281 Muscle weakness (generalized): Secondary | ICD-10-CM | POA: Diagnosis present

## 2021-08-30 DIAGNOSIS — G8929 Other chronic pain: Secondary | ICD-10-CM | POA: Insufficient documentation

## 2021-08-30 DIAGNOSIS — M25512 Pain in left shoulder: Secondary | ICD-10-CM | POA: Diagnosis present

## 2021-08-30 DIAGNOSIS — M25612 Stiffness of left shoulder, not elsewhere classified: Secondary | ICD-10-CM | POA: Insufficient documentation

## 2021-08-30 NOTE — Therapy (Signed)
[place], [place], [address] Phone: [phone]   Fax:  [phone]  Physical Therapy Treatment  Patient Details  Name: Omar Sanders MRN: [id number] Date of Birth: 1941/08/07 Referring Provider (PT): Omar Malay, MD   Encounter Date: 08/30/2021   PT End of Session - 08/30/21 1002     Visit Number 3    Number of Visits 17    Date for PT Re-Evaluation 10/14/21    Authorization Type UHC MCR and MCD    Authorization Time Period FOTO v6, v10    Progress Note Due on Visit 10    PT Start Time 1001    PT Stop Time 7416    PT Time Calculation (min) 44 min    Activity Tolerance Patient tolerated treatment well    Behavior During Therapy [facility] for tasks assessed/performed             Past Medical History:  Diagnosis Date   Abdominal bloating 07/20/2020   Allergy    Anal fissure    Anemia    Anxiety    Arthritis    all over body    Benign prostatic hyperplasia    Bilateral lower extremity edema 03/22/2018   Cataract    removed bilat    Chest pain 02/2018   Chronic kidney disease    Chronic kidney disease, stage 3a ([place]) 01/19/2016   Chronic pain    Colon polyp    Diabetes mellitus without complication ([place])    Falls 05/19/2021   GERD (gastroesophageal reflux disease)    Headache(784.0)    Hemorrhoids    Hepatic steatosis 05/19/2021   Hyperlipidemia    Hx: of   Hypertension    IBS (irritable bowel syndrome)    Incontinent of urine    Hx:of   Migraines    Neuromuscular disorder ([place])    periferal neuropathy per patient   New onset right bundle branch block (RBBB)    OSA (obstructive sleep apnea) 11/2009   sleep study with mild OSA, pt unwilling to use CPAP   Seizures ([place])    pt states he has never had a seizure    Sleep apnea    mild    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    Past Surgical History:  Procedure Laterality Date   CARDIAC  CATHETERIZATION  06/04/2009   mild to moderate CAD  w/o hemodynamic significant lesion   CATARACT EXTRACTION     bilat   CHOLECYSTECTOMY  2008   COLONOSCOPY  2012   Dr Collene Mares   HEMORRHOID SURGERY     anal surgery   INGUINAL HERNIA REPAIR Bilateral 03/11/2013   Procedure: LAPAROSCOPIC BILATERAL INGUINAL HERNIA REPAIR;  Surgeon: Ralene Ok, MD;  Location: [place];  Service: General;  Laterality: Bilateral;   INSERTION OF MESH Bilateral 03/11/2013   Procedure: INSERTION OF MESH;  Surgeon: Ralene Ok, MD;  Location: [place];  Service: General;  Laterality: Bilateral;   NM MYOCAR PERF WALL MOTION  05/27/2009   mild iscemia mid inferio & apical regions.   NM MYOCAR PERF WALL MOTION  11/01/2012   negative for ischemia   POLYPECTOMY     TRANSURETHRAL RESECTION OF PROSTATE N/A 02/18/2021   Procedure: TRANSURETHRAL RESECTION OF THE PROSTATE (TURP), BIPOLAR;  Surgeon: Ceasar Mons, MD;  Location: [facility];  Service: Urology;  Laterality: N/A;   US ECHOCARDIOGRAPHY  05/27/2009   mild mitral annular ca+,AOV mildly sclerotic  There were no vitals filed for this visit.   Subjective Assessment - 08/30/21 1009     Subjective Pt reports that he was sore after last visit.  He is unsure at this point if PT has helped.  L shoulder pain is 2-3 at rest and 6/10 with movement.    Patient is accompained by: Interpreter   Omar Sanders   Limitations Lifting;House hold activities    How long can you sit comfortably? Unlimited    How long can you stand comfortably? Unlimited    How long can you walk comfortably? Unlimited    Patient Stated Goals Reduce pain, return to performing household tasks, yardwork    Pain Onset More than a month ago             Omar Sanders:   Therapeutic Exercise: - UBE - 2.5'/2.5' while taking subjective and planning session with patient - corner stretch - 28'' x2 - X arm stretch - 45''x3 - scapular retraction - 3x20 with manual OP - standing  with band 3x20 ea with manual scapular assist with GTB  - forward leaning scapular aduction  - shoulder ext  - row - S/L ER - 3x10      PT Education - 08/30/21 1043     Education Details HEP              PT Short Term Goals - 08/19/21 1315       PT SHORT TERM GOAL #1   Title Pt will report understanding and adherence to his HEP in order to promote independence in the management of his primary impairments.    Baseline HEP provided at eval    Time 4    Period Weeks    Status New    Target Date 09/16/21               PT Long Term Goals - 08/19/21 1316       PT LONG TERM GOAL #1   Title Pt will achieve a FOTO score of 64% or higher in order to demonstrate improved functional ability as it relates to his shoulder impairments.    Baseline 53%    Time 8    Period Weeks    Status New    Target Date 10/14/21      PT LONG TERM GOAL #2   Title Pt will achieve L shoulder flexion and abduction AROM of 150 degrees or higher in order to reach into overhead cabinets without limitation.    Baseline 90 degrees, limited by pain    Time 8    Period Weeks    Status New    Target Date 10/14/21      PT LONG TERM GOAL #3   Title Pt will achieve global L shoulder strength of 4+/5 or greater in order to perform heavy yardwork such as raking without limitation.    Baseline See flowsheet    Time 8    Period Weeks    Status New    Target Date 10/14/21      PT LONG TERM GOAL #4   Title Pt will demonstrate ability to lift 5-pound weight overhead in order to perform heavy household chores without limitation.    Baseline Pt reports inability to lift 5 pounds overhead with his L UE.    Time 8    Period Weeks    Status New    Target Date 10/14/21  Plan - 08/30/21 1052     Clinical Impression Statement Pt reports no increase in baseline pain following therapy  HEP was updated and reissued to patient; pt educated on HEP, was provided handout, and  verbally confirmed understanding of exercises.    Overall, Omar Sanders is progressing well with therapy.  Today we concentrated on rotator cuff strengthening and periscapular strengthening.  Today I worked on improving scapulothoracic movement with UE below shoulder height.  Pt responded well to therapy with fatigue butn no adverse response.  Pt cued for form throughout.  Pt will continue to benefit from skilled physical therapy to address remaining deficits and achieve listed goals.  Continue per POC.    Personal Factors and Comorbidities Age;Comorbidity 3+    Comorbidities See medical Hx    Examination-Activity Limitations Lift;Sleep;Carry;Reach Overhead    Examination-Participation Restrictions Cleaning;Meal Prep;Yard Work    Stability/Clinical Decision Making Stable/Uncomplicated    Rehab Potential Good    PT Frequency 2x / week    PT Duration 8 weeks    PT Treatment/Interventions ADLs/Self Care Home Management;Moist Heat;Therapeutic activities;Therapeutic exercise;Neuromuscular re-education;Manual techniques;Dry needling;Passive range of motion;Patient/family education;Taping    PT Next Visit Plan Assess cervical spine to rule out cervical radiculopathy; progress parascapular/ RTC strengthening    PT Home Exercise Plan [email or web address]    Consulted and Agree with Plan of Care Patient             Patient will benefit from skilled therapeutic intervention in order to improve the following deficits and impairments:  Decreased range of motion, Impaired UE functional use, Pain, Decreased mobility, Decreased strength  Visit Diagnosis: Chronic left shoulder pain  Stiffness of left shoulder, not elsewhere classified  Muscle weakness (generalized)     Problem List Patient Active Problem List   Diagnosis Date Noted   Age-related cognitive decline 05/20/2021   Tinnitus aurium, 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   Abnormal hearing screen 05/20/2021   Failed vision screen 05/20/2021   Falls 05/19/2021    Hepatic steatosis 05/19/2021   Vitamin D insufficiency    Foley catheter in place 03/04/2021   BPH with obstruction/lower urinary tract symptoms 02/18/2021   Atopic dermatitis 03/04/2020   Rotator cuff disorder, left 07/28/2019   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   Aortic aneurysm ([place]) 03/26/2018   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   Constipation 06/01/2017   Hyperlipidemia associated with type 2 diabetes mellitus ([place]) 06/14/2016   Dizziness 02/14/2016   Chronic kidney disease, stage 3a ([place]) 01/19/2016   BPH (benign prostatic hyperplasia) 05/06/2015   Generalized anxiety disorder 12/29/2014   GERD 05/19/2010   Irritable bowel syndrome with constipation 05/19/2010   PERSONAL HX COLONIC POLYPS 05/19/2010   Hypertension associated with diabetes ([place]) 04/10/2007   Seasonal allergies 04/10/2007   HIATAL HERNIA 04/10/2007    Omar Sanders, PT 08/30/2021, 10:53 AM  [facility] [address] [place], [place], [address] Phone: [phone]   Fax:  [phone]  Name: Omar Sanders MRN: [id number] Date of Birth: 31-Dec-1940

## 2021-08-30 NOTE — Patient Instructions (Signed)
Access Code: [email or web address] URL: [email or web address] Date: 08/30/2021 Prepared by: Shearon Balo  Exercises Standing Isometric Shoulder External Rotation with Doorway and Towel Roll - 1 x daily - 7 x weekly - 3 sets - 5 reps - 5-sec hold Sidelying Shoulder External Rotation - 1 x daily - 7 x weekly - 3 sets - 10 reps

## 2021-09-01 ENCOUNTER — Ambulatory Visit (INDEPENDENT_AMBULATORY_CARE_PROVIDER_SITE_OTHER): Payer: Medicare Other

## 2021-09-01 ENCOUNTER — Other Ambulatory Visit: Payer: Self-pay

## 2021-09-01 DIAGNOSIS — Z23 Encounter for immunization: Secondary | ICD-10-CM

## 2021-09-02 DIAGNOSIS — R3915 Urgency of urination: Secondary | ICD-10-CM | POA: Diagnosis not present

## 2021-09-02 DIAGNOSIS — R3912 Poor urinary stream: Secondary | ICD-10-CM | POA: Diagnosis not present

## 2021-09-05 ENCOUNTER — Ambulatory Visit: Payer: Medicare Other

## 2021-09-05 ENCOUNTER — Other Ambulatory Visit: Payer: Self-pay

## 2021-09-05 DIAGNOSIS — M25612 Stiffness of left shoulder, not elsewhere classified: Secondary | ICD-10-CM

## 2021-09-05 DIAGNOSIS — G8929 Other chronic pain: Secondary | ICD-10-CM

## 2021-09-05 DIAGNOSIS — M6281 Muscle weakness (generalized): Secondary | ICD-10-CM

## 2021-09-05 DIAGNOSIS — M25512 Pain in left shoulder: Secondary | ICD-10-CM

## 2021-09-05 NOTE — Therapy (Signed)
[place], [place], [address] Phone: [phone]   Fax:  [phone]  Physical Therapy Treatment  Patient Details  Name: Omar Sanders MRN: [id number] Date of Birth: 30-Nov-1940 Referring Provider (PT): Martyn Malay, MD   Encounter Date: 09/05/2021   PT End of Session - 09/05/21 0913     Visit Number 4    Number of Visits 17    Date for PT Re-Evaluation 10/14/21    Authorization Type UHC MCR and MCD    Authorization Time Period FOTO v6, v10    Progress Note Due on Visit 10    PT Start Time 0915    PT Stop Time 4481    PT Time Calculation (min) 44 min    Activity Tolerance Patient tolerated treatment well    Behavior During Therapy [facility] for tasks assessed/performed             Past Medical History:  Diagnosis Date   Abdominal bloating 07/20/2020   Allergy    Anal fissure    Anemia    Anxiety    Arthritis    all over body    Benign prostatic hyperplasia    Bilateral lower extremity edema 03/22/2018   Cataract    removed bilat    Chest pain 02/2018   Chronic kidney disease    Chronic kidney disease, stage 3a ([place]) 01/19/2016   Chronic pain    Colon polyp    Diabetes mellitus without complication ([place])    Falls 05/19/2021   GERD (gastroesophageal reflux disease)    Headache(784.0)    Hemorrhoids    Hepatic steatosis 05/19/2021   Hyperlipidemia    Hx: of   Hypertension    IBS (irritable bowel syndrome)    Incontinent of urine    Hx:of   Migraines    Neuromuscular disorder ([place])    periferal neuropathy per patient   New onset right bundle branch block (RBBB)    OSA (obstructive sleep apnea) 11/2009   sleep study with mild OSA, pt unwilling to use CPAP   Seizures ([place])    pt states he has never had a seizure    Sleep apnea    mild    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    Past Surgical History:  Procedure Laterality Date   CARDIAC  CATHETERIZATION  06/04/2009   mild to moderate CAD  w/o hemodynamic significant lesion   CATARACT EXTRACTION     bilat   CHOLECYSTECTOMY  2008   COLONOSCOPY  2012   Dr Collene Mares   HEMORRHOID SURGERY     anal surgery   INGUINAL HERNIA REPAIR Bilateral 03/11/2013   Procedure: LAPAROSCOPIC BILATERAL INGUINAL HERNIA REPAIR;  Surgeon: Ralene Ok, MD;  Location: [place];  Service: General;  Laterality: Bilateral;   INSERTION OF MESH Bilateral 03/11/2013   Procedure: INSERTION OF MESH;  Surgeon: Ralene Ok, MD;  Location: [place];  Service: General;  Laterality: Bilateral;   NM MYOCAR PERF WALL MOTION  05/27/2009   mild iscemia mid inferio & apical regions.   NM MYOCAR PERF WALL MOTION  11/01/2012   negative for ischemia   POLYPECTOMY     TRANSURETHRAL RESECTION OF PROSTATE N/A 02/18/2021   Procedure: TRANSURETHRAL RESECTION OF THE PROSTATE (TURP), BIPOLAR;  Surgeon: Ceasar Mons, MD;  [facility];  Service: Urology;  Laterality: N/A;   US ECHOCARDIOGRAPHY  05/27/2009   mild mitral annular ca+,AOV mildly sclerotic  There were no vitals filed for this visit.   Subjective Assessment - 09/05/21 0914     Subjective Pt presents to PT with continued reports of L shoulder pain. He still notes pain is worst at night, decreases when he can massage it during the day. He states he has been fairly compliant with HEP. Ready to begin PT at this time.    Patient is accompained by: Interpreter   [name]   Currently in Pain? Yes    Pain Score 6     Pain Location Shoulder    Pain Orientation Left           [facility] Adult PT Treatment/Exercise:   Therapeutic Exercise: - UBE - 2.5'/2.5' while taking subjective and planning session with patient - corner stretch - 21'' x2 - X arm stretch - 45''x3 - scapular retraction - 3x20 with manual OP - standing with band 3x20 ea with manual scapular assist with GTB - L shoulder IR/ER isometric 2x10 - 5 sec hold - supine horiztonal abd 2x10 red  tband - S/L ER - 3x10 - S/L abd 2x10 2lb L shoulder  Manual Therapy:  Grade III AP mobs to L shoulder in supine  STM to L long head of biceps tendon                                PT Short Term Goals - 08/19/21 1315       PT SHORT TERM GOAL #1   Title Pt will report understanding and adherence to his HEP in order to promote independence in the management of his primary impairments.    Baseline HEP provided at eval    Time 4    Period Weeks    Status New    Target Date 09/16/21               PT Long Term Goals - 08/19/21 1316       PT LONG TERM GOAL #1   Title Pt will achieve a FOTO score of 64% or higher in order to demonstrate improved functional ability as it relates to his shoulder impairments.    Baseline 53%    Time 8    Period Weeks    Status New    Target Date 10/14/21      PT LONG TERM GOAL #2   Title Pt will achieve L shoulder flexion and abduction AROM of 150 degrees or higher in order to reach into overhead cabinets without limitation.    Baseline 90 degrees, limited by pain    Time 8    Period Weeks    Status New    Target Date 10/14/21      PT LONG TERM GOAL #3   Title Pt will achieve global L shoulder strength of 4+/5 or greater in order to perform heavy yardwork such as raking without limitation.    Baseline See flowsheet    Time 8    Period Weeks    Status New    Target Date 10/14/21      PT LONG TERM GOAL #4   Title Pt will demonstrate ability to lift 5-pound weight overhead in order to perform heavy household chores without limitation.    Baseline Pt reports inability to lift 5 pounds overhead with his L UE.    Time 8    Period Weeks    Status New    Target Date 10/14/21  Plan - 09/05/21 0936     Clinical Impression Statement Pt was able to complete prescribed exercises with no adverse effect. Today's session focused on improving periscapular strength and scapulo-thoracic movement  in order to decrease pain and improve stability. Responded well to manual therapy interventions, reporting decreased pain ot 2/10 post session. He continues to benefit from skilled PT services and should continue to be seen and progressed as toelrated.    PT Treatment/Interventions ADLs/Self Care Home Management;Moist Heat;Therapeutic activities;Therapeutic exercise;Neuromuscular re-education;Manual techniques;Dry needling;Passive range of motion;Patient/family education;Taping    PT Next Visit Plan Assess cervical spine to rule out cervical radiculopathy; progress parascapular/ RTC strengthening    PT Home Exercise Plan [id number]             Patient will benefit from skilled therapeutic intervention in order to improve the following deficits and impairments:  Decreased range of motion, Impaired UE functional use, Pain, Decreased mobility, Decreased strength  Visit Diagnosis: Chronic left shoulder pain  Stiffness of left shoulder, not elsewhere classified  Muscle weakness (generalized)     Problem List Patient Active Problem List   Diagnosis Date Noted   Age-related cognitive decline 05/20/2021   Tinnitus aurium, 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   Abnormal hearing screen 05/20/2021   Failed vision screen 05/20/2021   Falls 05/19/2021   Hepatic steatosis 05/19/2021   Vitamin D insufficiency    Foley catheter in place 03/04/2021   BPH with obstruction/lower urinary tract symptoms 02/18/2021   Atopic dermatitis 03/04/2020   Rotator cuff disorder, left 07/28/2019   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   Aortic aneurysm ([place]) 03/26/2018   Type 2 diabetes mellitus with diabetic chronic kidney disease ([place]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   Constipation 06/01/2017   Hyperlipidemia associated with type 2 diabetes mellitus ([place]) 06/14/2016   Dizziness 02/14/2016   Chronic kidney disease, stage 3a ([place]) 01/19/2016   BPH (benign prostatic hyperplasia) 05/06/2015   Generalized anxiety disorder 12/29/2014   GERD  05/19/2010   Irritable bowel syndrome with constipation 05/19/2010   PERSONAL HX COLONIC POLYPS 05/19/2010   Hypertension associated with diabetes ([place]) 04/10/2007   Seasonal allergies 04/10/2007   HIATAL HERNIA 04/10/2007    Ward Chatters, PT 09/05/2021, 10:00 AM  [facility] [address] [place], [place], [address] Phone: [phone]   Fax:  [phone]  Name: Omar Sanders MRN: [id number] Date of Birth: Nov 07, 1940

## 2021-09-07 ENCOUNTER — Ambulatory Visit: Payer: Medicare Other

## 2021-09-07 ENCOUNTER — Other Ambulatory Visit: Payer: Self-pay

## 2021-09-07 DIAGNOSIS — M25612 Stiffness of left shoulder, not elsewhere classified: Secondary | ICD-10-CM

## 2021-09-07 DIAGNOSIS — G8929 Other chronic pain: Secondary | ICD-10-CM

## 2021-09-07 DIAGNOSIS — M25512 Pain in left shoulder: Secondary | ICD-10-CM

## 2021-09-07 DIAGNOSIS — M6281 Muscle weakness (generalized): Secondary | ICD-10-CM

## 2021-09-07 NOTE — Therapy (Signed)
[place], [place], [address] Phone: [phone]   Fax:  [phone]  Physical Therapy Treatment  Patient Details  Name: Omar Sanders MRN: [id number] Date of Birth: 1941/09/29 Referring Provider (PT): Martyn Malay, MD   Encounter Date: 09/07/2021   PT End of Session - 09/07/21 0911     Visit Number 5    Number of Visits 17    Date for PT Re-Evaluation 10/14/21    Authorization Type UHC MCR and MCD    Authorization Time Period FOTO v6, v10    Progress Note Due on Visit 10    PT Start Time 0915    PT Stop Time 0958    PT Time Calculation (min) 43 min    Activity Tolerance Patient tolerated treatment well    Behavior During Therapy [facility] for tasks assessed/performed             Past Medical History:  Diagnosis Date   Abdominal bloating 07/20/2020   Allergy    Anal fissure    Anemia    Anxiety    Arthritis    all over body    Benign prostatic hyperplasia    Bilateral lower extremity edema 03/22/2018   Cataract    removed bilat    Chest pain 02/2018   Chronic kidney disease    Chronic kidney disease, stage 3a ([place]) 01/19/2016   Chronic pain    Colon polyp    Diabetes mellitus without complication ([place])    Falls 05/19/2021   GERD (gastroesophageal reflux disease)    Headache(784.0)    Hemorrhoids    Hepatic steatosis 05/19/2021   Hyperlipidemia    Hx: of   Hypertension    IBS (irritable bowel syndrome)    Incontinent of urine    Hx:of   Migraines    Neuromuscular disorder ([place])    periferal neuropathy per patient   New onset right bundle branch block (RBBB)    OSA (obstructive sleep apnea) 11/2009   sleep study with mild OSA, pt unwilling to use CPAP   Seizures ([place])    pt states he has never had a seizure    Sleep apnea    mild    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    Past Surgical History:  Procedure Laterality Date   CARDIAC  CATHETERIZATION  06/04/2009   mild to moderate CAD  w/o hemodynamic significant lesion   CATARACT EXTRACTION     bilat   CHOLECYSTECTOMY  2008   COLONOSCOPY  2012   Dr Collene Mares   HEMORRHOID SURGERY     anal surgery   INGUINAL HERNIA REPAIR Bilateral 03/11/2013   Procedure: LAPAROSCOPIC BILATERAL INGUINAL HERNIA REPAIR;  Surgeon: Ralene Ok, MD;  Location: [place];  Service: General;  Laterality: Bilateral;   INSERTION OF MESH Bilateral 03/11/2013   Procedure: INSERTION OF MESH;  Surgeon: Ralene Ok, MD;  Location: [place];  Service: General;  Laterality: Bilateral;   NM MYOCAR PERF WALL MOTION  05/27/2009   mild iscemia mid inferio & apical regions.   NM MYOCAR PERF WALL MOTION  11/01/2012   negative for ischemia   POLYPECTOMY     TRANSURETHRAL RESECTION OF PROSTATE N/A 02/18/2021   Procedure: TRANSURETHRAL RESECTION OF THE PROSTATE (TURP), BIPOLAR;  Surgeon: Ceasar Mons, MD;  [facility];  Service: Urology;  Laterality: N/A;   US ECHOCARDIOGRAPHY  05/27/2009   mild mitral annular ca+,AOV mildly sclerotic  There were no vitals filed for this visit.   Subjective Assessment - 09/07/21 0912     Subjective Pt presents to PT with reports of L shoulder pain at similar levels. Notes pain with fwd elevation. Pt is ready to begin PT at this time.    Currently in Pain? Yes    Pain Score 8     Pain Location Shoulder    Pain Orientation Left           [facility] Adult PT Treatment/Exercise:   Therapeutic Exercise: - UBE - 2.5'/2.5' while taking subjective and planning session with patient - X arm stretch - 45''x3 - standing with band 3x12 ea with manual scapular assist with GTB - supine horiztonal abd 2x10 red tband - supine D2 LUE flexion red tband 2x10 - S/L ER - 3x10 - S/L abd 2x10 L shoulder  Past Interventions Not Performed Today: - scapular retraction - 3x20 with manual OP - corner stretch - 78'' x2     [facility] PT Assessment - 09/07/21 0001        Observation/Other Assessments   Focus on Therapeutic Outcomes (FOTO)  52% function                                      PT Short Term Goals - 08/19/21 1315       PT SHORT TERM GOAL #1   Title Pt will report understanding and adherence to his HEP in order to promote independence in the management of his primary impairments.    Baseline HEP provided at eval    Time 4    Period Weeks    Status New    Target Date 09/16/21               PT Long Term Goals - 08/19/21 1316       PT LONG TERM GOAL #1   Title Pt will achieve a FOTO score of 64% or higher in order to demonstrate improved functional ability as it relates to his shoulder impairments.    Baseline 53%    Time 8    Period Weeks    Status New    Target Date 10/14/21      PT LONG TERM GOAL #2   Title Pt will achieve L shoulder flexion and abduction AROM of 150 degrees or higher in order to reach into overhead cabinets without limitation.    Baseline 90 degrees, limited by pain    Time 8    Period Weeks    Status New    Target Date 10/14/21      PT LONG TERM GOAL #3   Title Pt will achieve global L shoulder strength of 4+/5 or greater in order to perform heavy yardwork such as raking without limitation.    Baseline See flowsheet    Time 8    Period Weeks    Status New    Target Date 10/14/21      PT LONG TERM GOAL #4   Title Pt will demonstrate ability to lift 5-pound weight overhead in order to perform heavy household chores without limitation.    Baseline Pt reports inability to lift 5 pounds overhead with his L UE.    Time 8    Period Weeks    Status New    Target Date 10/14/21  Plan - 09/07/21 0920     Clinical Impression Statement Pt was able to complete prescribed exercises with no adverse effect. He continues to have discomfort in L shoulder with elevation and FOTO score remains unchanged. His HEP was updated for continued RTC and periscapular  strengthening. Pt will continue to be seen per POC as prescribed and progressed as able.    PT Treatment/Interventions ADLs/Self Care Home Management;Moist Heat;Therapeutic activities;Therapeutic exercise;Neuromuscular re-education;Manual techniques;Dry needling;Passive range of motion;Patient/family education;Taping    PT Next Visit Plan Assess cervical spine to rule out cervical radiculopathy; progress parascapular/ RTC strengthening    PT Home Exercise Plan Access Code: [email or web address]             Patient will benefit from skilled therapeutic intervention in order to improve the following deficits and impairments:  Decreased range of motion, Impaired UE functional use, Pain, Decreased mobility, Decreased strength  Visit Diagnosis: Chronic left shoulder pain  Stiffness of left shoulder, not elsewhere classified  Muscle weakness (generalized)     Problem List Patient Active Problem List   Diagnosis Date Noted   Age-related cognitive decline 05/20/2021   Tinnitus aurium, 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   Abnormal hearing screen 05/20/2021   Failed vision screen 05/20/2021   Falls 05/19/2021   Hepatic steatosis 05/19/2021   Vitamin D insufficiency    Foley catheter in place 03/04/2021   BPH with obstruction/lower urinary tract symptoms 02/18/2021   Atopic dermatitis 03/04/2020   Rotator cuff disorder, left 07/28/2019   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   Aortic aneurysm ([place]) 03/26/2018   Type 2 diabetes mellitus with diabetic chronic kidney disease ([place]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   Constipation 06/01/2017   Hyperlipidemia associated with type 2 diabetes mellitus ([place]) 06/14/2016   Dizziness 02/14/2016   Chronic kidney disease, stage 3a ([place]) 01/19/2016   BPH (benign prostatic hyperplasia) 05/06/2015   Generalized anxiety disorder 12/29/2014   GERD 05/19/2010   Irritable bowel syndrome with constipation 05/19/2010   PERSONAL HX COLONIC POLYPS 05/19/2010   Hypertension associated with diabetes ([place])  04/10/2007   Seasonal allergies 04/10/2007   HIATAL HERNIA 04/10/2007    Ward Chatters, PT 09/07/2021, 9:58 AM  [facility] [address] [place], [place], [address] Phone: [phone]   Fax:  [phone]  Name: Omar Sanders MRN: [id number] Date of Birth: 12/14/1940

## 2021-09-13 ENCOUNTER — Other Ambulatory Visit: Payer: Self-pay

## 2021-09-13 ENCOUNTER — Ambulatory Visit: Payer: Medicare Other

## 2021-09-13 DIAGNOSIS — M25512 Pain in left shoulder: Secondary | ICD-10-CM | POA: Diagnosis not present

## 2021-09-13 DIAGNOSIS — M6281 Muscle weakness (generalized): Secondary | ICD-10-CM

## 2021-09-13 DIAGNOSIS — G8929 Other chronic pain: Secondary | ICD-10-CM

## 2021-09-13 DIAGNOSIS — M25612 Stiffness of left shoulder, not elsewhere classified: Secondary | ICD-10-CM

## 2021-09-13 NOTE — Therapy (Signed)
[place], [place], [address] Phone: [phone]   Fax:  [phone]  Physical Therapy Treatment  Patient Details  Name: Omar Sanders MRN: [id number] Date of Birth: 02/09/1941 Referring Provider (PT): Martyn Malay, MD   Encounter Date: 09/13/2021   PT End of Session - 09/13/21 0924     Visit Number 6    Number of Visits 17    Date for PT Re-Evaluation 10/14/21    Authorization Type UHC MCR and MCD    Authorization Time Period FOTO v6, v10    Progress Note Due on Visit 10    PT Start Time 0924    PT Stop Time 4680    PT Time Calculation (min) 38 min    Activity Tolerance Patient tolerated treatment well    Behavior During Therapy [facility] for tasks assessed/performed             Past Medical History:  Diagnosis Date   Abdominal bloating 07/20/2020   Allergy    Anal fissure    Anemia    Anxiety    Arthritis    all over body    Benign prostatic hyperplasia    Bilateral lower extremity edema 03/22/2018   Cataract    removed bilat    Chest pain 02/2018   Chronic kidney disease    Chronic kidney disease, stage 3a ([place]) 01/19/2016   Chronic pain    Colon polyp    Diabetes mellitus without complication ([place])    Falls 05/19/2021   GERD (gastroesophageal reflux disease)    Headache(784.0)    Hemorrhoids    Hepatic steatosis 05/19/2021   Hyperlipidemia    Hx: of   Hypertension    IBS (irritable bowel syndrome)    Incontinent of urine    Hx:of   Migraines    Neuromuscular disorder ([place])    periferal neuropathy per patient   New onset right bundle branch block (RBBB)    OSA (obstructive sleep apnea) 11/2009   sleep study with mild OSA, pt unwilling to use CPAP   Seizures ([place])    pt states he has never had a seizure    Sleep apnea    mild    Type 2 diabetes mellitus with diabetic chronic kidney disease ([place]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    Past Surgical History:  Procedure Laterality Date   CARDIAC  CATHETERIZATION  06/04/2009   mild to moderate CAD  w/o hemodynamic significant lesion   CATARACT EXTRACTION     bilat   CHOLECYSTECTOMY  2008   COLONOSCOPY  2012   Dr Collene Mares   HEMORRHOID SURGERY     anal surgery   INGUINAL HERNIA REPAIR Bilateral 03/11/2013   Procedure: LAPAROSCOPIC BILATERAL INGUINAL HERNIA REPAIR;  Surgeon: Ralene Ok, MD;  Location: [place];  Service: General;  Laterality: Bilateral;   INSERTION OF MESH Bilateral 03/11/2013   Procedure: INSERTION OF MESH;  Surgeon: Ralene Ok, MD;  Location: [place];  Service: General;  Laterality: Bilateral;   NM MYOCAR PERF WALL MOTION  05/27/2009   mild iscemia mid inferio & apical regions.   NM MYOCAR PERF WALL MOTION  11/01/2012   negative for ischemia   POLYPECTOMY     TRANSURETHRAL RESECTION OF PROSTATE N/A 02/18/2021   Procedure: TRANSURETHRAL RESECTION OF THE PROSTATE (TURP), BIPOLAR;  Surgeon: Ceasar Mons, MD;  [facility];  Service: Urology;  Laterality: N/A;   US ECHOCARDIOGRAPHY  05/27/2009   mild mitral annular ca+,AOV mildly sclerotic  There were no vitals filed for this visit.   Subjective Assessment - 09/13/21 0926     Subjective Pt presents to PT with continued 5/10 shoulder pain. Has continued to compliant with HEP.           [place] Adult PT Treatment/Exercise:   Therapeutic Exercise: - UBE - 2.5'/2.5' while taking subjective and planning session with patient - Row 3x10 17lbs - Shoulder ext 3x10 20lbs - L shoulder IR/ER red tband 3x10 - wall slide L shoulder flex 2x10  - seated horizontal abd 2x15 red tband - seated bilat ER 2x10 red - X arm stretch - 45''x 2 - supine D2 LUE flexion red tband 3x10 - S/L ER - 2x10 - S/L abd 2x10 L shoulder   Past Interventions Not Performed Today: - scapular retraction - 3x20 with manual OP - corner stretch - 45''                                PT Short Term Goals - 08/19/21 1315       PT SHORT TERM GOAL #1    Title Pt will report understanding and adherence to his HEP in order to promote independence in the management of his primary impairments.    Baseline HEP provided at eval    Time 4    Period Weeks    Status New    Target Date 09/16/21               PT Long Term Goals - 08/19/21 1316       PT LONG TERM GOAL #1   Title Pt will achieve a FOTO score of 64% or higher in order to demonstrate improved functional ability as it relates to his shoulder impairments.    Baseline 53%    Time 8    Period Weeks    Status New    Target Date 10/14/21      PT LONG TERM GOAL #2   Title Pt will achieve L shoulder flexion and abduction AROM of 150 degrees or higher in order to reach into overhead cabinets without limitation.    Baseline 90 degrees, limited by pain    Time 8    Period Weeks    Status New    Target Date 10/14/21      PT LONG TERM GOAL #3   Title Pt will achieve global L shoulder strength of 4+/5 or greater in order to perform heavy yardwork such as raking without limitation.    Baseline See flowsheet    Time 8    Period Weeks    Status New    Target Date 10/14/21      PT LONG TERM GOAL #4   Title Pt will demonstrate ability to lift 5-pound weight overhead in order to perform heavy household chores without limitation.    Baseline Pt reports inability to lift 5 pounds overhead with his L UE.    Time 8    Period Weeks    Status New    Target Date 10/14/21                   Plan - 09/13/21 0950     Clinical Impression Statement Pt was able to once again complete prescribed exercises with no adverse effect. He demonstrated improved functional activity tolerance and AROM today. We focused on continuing to improve periscapular and RTC muscle strength in order to improve dynamic stabilization  wiht upward rotation. He noted decreased pain post session to 2/10, noting that movement continues to feel better. PT will continue to progress exercises as able pre POC.    PT  Treatment/Interventions ADLs/Self Care Home Management;Moist Heat;Therapeutic activities;Therapeutic exercise;Neuromuscular re-education;Manual techniques;Dry needling;Passive range of motion;Patient/family education;Taping    PT Next Visit Plan assess goals; progress parascapular/ RTC strengthening    PT Home Exercise Plan Access Code: [email or web address]             Patient will benefit from skilled therapeutic intervention in order to improve the following deficits and impairments:  Decreased range of motion, Impaired UE functional use, Pain, Decreased mobility, Decreased strength  Visit Diagnosis: Chronic left shoulder pain  Stiffness of left shoulder, not elsewhere classified  Muscle weakness (generalized)     Problem List Patient Active Problem List   Diagnosis Date Noted   Age-related cognitive decline 05/20/2021   Tinnitus aurium, 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   Abnormal hearing screen 05/20/2021   Failed vision screen 05/20/2021   Falls 05/19/2021   Hepatic steatosis 05/19/2021   Vitamin D insufficiency    Foley catheter in place 03/04/2021   BPH with obstruction/lower urinary tract symptoms 02/18/2021   Atopic dermatitis 03/04/2020   Rotator cuff disorder, left 07/28/2019   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   Aortic aneurysm ([place]) 03/26/2018   Type 2 diabetes mellitus with diabetic chronic kidney disease ([place]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   Constipation 06/01/2017   Hyperlipidemia associated with type 2 diabetes mellitus ([place]) 06/14/2016   Dizziness 02/14/2016   Chronic kidney disease, stage 3a ([place]) 01/19/2016   BPH (benign prostatic hyperplasia) 05/06/2015   Generalized anxiety disorder 12/29/2014   GERD 05/19/2010   Irritable bowel syndrome with constipation 05/19/2010   PERSONAL HX COLONIC POLYPS 05/19/2010   Hypertension associated with diabetes ([place]) 04/10/2007   Seasonal allergies 04/10/2007   HIATAL HERNIA 04/10/2007    Ward Chatters, PT 09/13/2021, 10:03 AM  [place] [facility] [address] [place], [place], [address] Phone: [phone]   Fax:  [phone]  Name: ELJAY LAVE MRN: [id number] Date of Birth: 09/26/1941

## 2021-09-25 ENCOUNTER — Other Ambulatory Visit: Payer: Self-pay | Admitting: Family Medicine

## 2021-09-25 DIAGNOSIS — E785 Hyperlipidemia, unspecified: Secondary | ICD-10-CM

## 2021-09-27 ENCOUNTER — Encounter: Payer: Medicare Other | Admitting: Physical Therapy

## 2021-09-27 ENCOUNTER — Ambulatory Visit: Payer: Medicare Other

## 2021-09-29 ENCOUNTER — Other Ambulatory Visit: Payer: Self-pay

## 2021-09-29 ENCOUNTER — Ambulatory Visit: Payer: Medicare Other | Attending: Family Medicine

## 2021-09-29 DIAGNOSIS — M6281 Muscle weakness (generalized): Secondary | ICD-10-CM | POA: Insufficient documentation

## 2021-09-29 DIAGNOSIS — M25512 Pain in left shoulder: Secondary | ICD-10-CM | POA: Insufficient documentation

## 2021-09-29 DIAGNOSIS — G8929 Other chronic pain: Secondary | ICD-10-CM | POA: Insufficient documentation

## 2021-09-29 DIAGNOSIS — M25612 Stiffness of left shoulder, not elsewhere classified: Secondary | ICD-10-CM | POA: Diagnosis present

## 2021-09-29 NOTE — Therapy (Signed)
[place], [place], [address] Phone: [phone]   Fax:  [phone]  Physical Therapy Treatment  Patient Details  Name: Omar Sanders MRN: [id number] Date of Birth: 1941/02/15 Referring Provider (PT): Martyn Malay, MD   Encounter Date: 09/29/2021   PT End of Session - 09/29/21 0912     Visit Number 7    Number of Visits 17    Date for PT Re-Evaluation 10/14/21    Authorization Type UHC MCR and MCD    Authorization Time Period FOTO v6, v10    Progress Note Due on Visit 10    PT Start Time 0915    PT Stop Time 4888    PT Time Calculation (min) 40 min    Activity Tolerance Patient tolerated treatment well    Behavior During Therapy [facility] for tasks assessed/performed             Past Medical History:  Diagnosis Date   Abdominal bloating 07/20/2020   Allergy    Anal fissure    Anemia    Anxiety    Arthritis    all over body    Benign prostatic hyperplasia    Bilateral lower extremity edema 03/22/2018   Cataract    removed bilat    Chest pain 02/2018   Chronic kidney disease    Chronic kidney disease, stage 3a ([place]) 01/19/2016   Chronic pain    Colon polyp    Diabetes mellitus without complication ([place])    Falls 05/19/2021   GERD (gastroesophageal reflux disease)    Headache(784.0)    Hemorrhoids    Hepatic steatosis 05/19/2021   Hyperlipidemia    Hx: of   Hypertension    IBS (irritable bowel syndrome)    Incontinent of urine    Hx:of   Migraines    Neuromuscular disorder ([place])    periferal neuropathy per patient   New onset right bundle branch block (RBBB)    OSA (obstructive sleep apnea) 11/2009   sleep study with mild OSA, pt unwilling to use CPAP   Seizures ([place])    pt states he has never had a seizure    Sleep apnea    mild    Type 2 diabetes mellitus with diabetic chronic kidney disease ([place]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    Past Surgical History:  Procedure Laterality Date   CARDIAC  CATHETERIZATION  06/04/2009   mild to moderate CAD  w/o hemodynamic significant lesion   CATARACT EXTRACTION     bilat   CHOLECYSTECTOMY  2008   COLONOSCOPY  2012   Dr Collene Mares   HEMORRHOID SURGERY     anal surgery   INGUINAL HERNIA REPAIR Bilateral 03/11/2013   Procedure: LAPAROSCOPIC BILATERAL INGUINAL HERNIA REPAIR;  Surgeon: Ralene Ok, MD;  Location: [place];  Service: General;  Laterality: Bilateral;   INSERTION OF MESH Bilateral 03/11/2013   Procedure: INSERTION OF MESH;  Surgeon: Ralene Ok, MD;  Location: [place];  Service: General;  Laterality: Bilateral;   NM MYOCAR PERF WALL MOTION  05/27/2009   mild iscemia mid inferio & apical regions.   NM MYOCAR PERF WALL MOTION  11/01/2012   negative for ischemia   POLYPECTOMY     TRANSURETHRAL RESECTION OF PROSTATE N/A 02/18/2021   Procedure: TRANSURETHRAL RESECTION OF THE PROSTATE (TURP), BIPOLAR;  Surgeon: Ceasar Mons, MD;  [facility];  Service: Urology;  Laterality: N/A;   US ECHOCARDIOGRAPHY  05/27/2009   mild mitral annular ca+,AOV mildly sclerotic  There were no vitals filed for this visit.    Subjective Assessment - 09/29/21 0912     Subjective Pt presents to PT with continued L shoulder pain, notes that it feels better with exercise and movement. HEP continues to improve symptoms for a time, but when he wakes up the next morning it continues to bother him. He is ready to begin PT at this time.    Currently in Pain? Yes    Pain Score 4     Pain Location Shoulder    Pain Orientation Left           [facility] Adult PT Treatment/Exercise:   Therapeutic Exercise:  UBE - 2'/2' while taking subjective and planning session with patient L 1.5 Total gym row 3x10 25lb Lat pulldown to chest 2x10 25lb  Corner stretch 2x30" IR towel stretch 2x30" L L shoulder IR/ER red tband 3x10 - NT Seated bilat ER 2x10 red supine D2 LUE flexion red tband 3x10 S/L abd 3x10 L shoulder 2lb  Manual Therapy: AP glide grade II L  shoulder Interior glide grade II L shoulder L shoulder IR PROM stretch   Neuromuscular re-ed: N/A   Therapeutic Activity: N/A   Modalities: N/A   Self Care: N/A   Consider / progression for next session:                              PT Education - 09/29/21 0950     Education Details HEP update    Person(s) Educated Patient    Methods Explanation;Demonstration;Handout    Comprehension Verbalized understanding;Returned demonstration              PT Short Term Goals - 08/19/21 1315       PT SHORT TERM GOAL #1   Title Pt will report understanding and adherence to his HEP in order to promote independence in the management of his primary impairments.    Baseline HEP provided at eval    Time 4    Period Weeks    Status New    Target Date 09/16/21               PT Long Term Goals - 08/19/21 1316       PT LONG TERM GOAL #1   Title Pt will achieve a FOTO score of 64% or higher in order to demonstrate improved functional ability as it relates to his shoulder impairments.    Baseline 53%    Time 8    Period Weeks    Status New    Target Date 10/14/21      PT LONG TERM GOAL #2   Title Pt will achieve L shoulder flexion and abduction AROM of 150 degrees or higher in order to reach into overhead cabinets without limitation.    Baseline 90 degrees, limited by pain    Time 8    Period Weeks    Status New    Target Date 10/14/21      PT LONG TERM GOAL #3   Title Pt will achieve global L shoulder strength of 4+/5 or greater in order to perform heavy yardwork such as raking without limitation.    Baseline See flowsheet    Time 8    Period Weeks    Status New    Target Date 10/14/21      PT LONG TERM GOAL #4   Title Pt will demonstrate ability to lift 5-pound weight overhead  in order to perform heavy household chores without limitation.    Baseline Pt reports inability to lift 5 pounds overhead with his L UE.    Time 8    Period  Weeks    Status New    Target Date 10/14/21                   Plan - 09/29/21 2876     Clinical Impression Statement Pt was able to complete prescribed exercises with no adverse effect. Therapy today focused on improving periscapular and dynamic L shoulder stabilizer strength, as well as manual interventions to decrease pain and improve L shoulder function. He responded well to manual therapy, noting decreased pain and demonstrated increased L shoulder abd/IR AROM post session. IR stretch for L shoulder added to HEP, with PT adding a few more sessions per POC. Will continue to progress as tolerated.    PT Treatment/Interventions ADLs/Self Care Home Management;Moist Heat;Therapeutic activities;Therapeutic exercise;Neuromuscular re-education;Manual techniques;Dry needling;Passive range of motion;Patient/family education;Taping    PT Next Visit Plan assess HEP response; mobs and IR stretching; progress strenghtening as able    PT Home Exercise Plan Access Code: [email or web address]             Patient will benefit from skilled therapeutic intervention in order to improve the following deficits and impairments:  Decreased range of motion, Impaired UE functional use, Pain, Decreased mobility, Decreased strength  Visit Diagnosis: Chronic left shoulder pain  Stiffness of left shoulder, not elsewhere classified  Muscle weakness (generalized)     Problem List Patient Active Problem List   Diagnosis Date Noted   Age-related cognitive decline 05/20/2021   Tinnitus aurium, 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   Abnormal hearing screen 05/20/2021   Failed vision screen 05/20/2021   Falls 05/19/2021   Hepatic steatosis 05/19/2021   Vitamin D insufficiency    Foley catheter in place 03/04/2021   BPH with obstruction/lower urinary tract symptoms 02/18/2021   Atopic dermatitis 03/04/2020   Rotator cuff disorder, left 07/28/2019   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   Aortic aneurysm ([place]) 03/26/2018    Type 2 diabetes mellitus with diabetic chronic kidney disease ([place]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   Constipation 06/01/2017   Hyperlipidemia associated with type 2 diabetes mellitus ([place]) 06/14/2016   Dizziness 02/14/2016   Chronic kidney disease, stage 3a ([place]) 01/19/2016   BPH (benign prostatic hyperplasia) 05/06/2015   Generalized anxiety disorder 12/29/2014   GERD 05/19/2010   Irritable bowel syndrome with constipation 05/19/2010   PERSONAL HX COLONIC POLYPS 05/19/2010   Hypertension associated with diabetes ([place]) 04/10/2007   Seasonal allergies 04/10/2007   HIATAL HERNIA 04/10/2007    Ward Chatters, PT 09/29/2021, 10:19 AM  [facility] [address] [place], [place], [address] Phone: [phone]   Fax:  [phone]  Name: YEISON SIPPEL MRN: [id number] Date of Birth: 18-Nov-1940

## 2021-10-03 ENCOUNTER — Other Ambulatory Visit: Payer: Self-pay | Admitting: Family Medicine

## 2021-10-03 DIAGNOSIS — E785 Hyperlipidemia, unspecified: Secondary | ICD-10-CM

## 2021-10-03 MED ORDER — ATORVASTATIN CALCIUM 40 MG PO TABS: ORAL_TABLET | ORAL | 3 refills | Status: DC

## 2021-10-05 ENCOUNTER — Telehealth: Payer: Self-pay | Admitting: *Deleted

## 2021-10-05 NOTE — Telephone Encounter (Signed)
-----   Message from [facility], DO sent at 10/05/2021 10:40 AM EST ----- Regarding: [place] [facility] Can you please call this patient to be seen in [facility] clinic tomorrow?! Sorry for the tardiness of this message. Thank you

## 2021-10-05 NOTE — Telephone Encounter (Signed)
Pt scheduled. Please call them with the time. Majestic Molony Kennon Holter, CMA

## 2021-10-06 NOTE — Telephone Encounter (Signed)
Attempted to call patient with [place] interpreter to inform of [place] clinic appointment. No answer, and unable to LVM on either number (mobile and home).   [place], DO 10/06/2021, 9:50 AM PGY-3, [place]

## 2021-10-07 ENCOUNTER — Ambulatory Visit: Payer: Medicare Other | Admitting: Family Medicine

## 2021-10-11 ENCOUNTER — Ambulatory Visit (INDEPENDENT_AMBULATORY_CARE_PROVIDER_SITE_OTHER): Payer: Medicare Other | Admitting: Family Medicine

## 2021-10-11 ENCOUNTER — Encounter: Payer: Self-pay | Admitting: Family Medicine

## 2021-10-11 ENCOUNTER — Other Ambulatory Visit: Payer: Self-pay

## 2021-10-11 VITALS — BP 146/75 | HR 60 | Wt 186.2 lb

## 2021-10-11 DIAGNOSIS — E1159 Type 2 diabetes mellitus with other circulatory complications: Secondary | ICD-10-CM | POA: Diagnosis not present

## 2021-10-11 DIAGNOSIS — E1122 Type 2 diabetes mellitus with diabetic chronic kidney disease: Secondary | ICD-10-CM | POA: Diagnosis not present

## 2021-10-11 DIAGNOSIS — I152 Hypertension secondary to endocrine disorders: Secondary | ICD-10-CM

## 2021-10-11 LAB — POCT GLYCOSYLATED HEMOGLOBIN (HGB A1C): HbA1c, POC (controlled diabetic range): 7 % (ref 0.0–7.0)

## 2021-10-11 NOTE — Progress Notes (Signed)
° °  [place] interpreter Cherylann Banas [phone] used during entire encounter  SUBJECTIVE:   CHIEF COMPLAINT / HPI:   Omar Sanders is a 80 yo M who presents for follow up on the items below.   Hypertension: - Medications: N/a; previously on lisinopril 5 mg daily (states he was taken off due to kidney function declining) - Checking BP at home: Yes 150-160 SBP - Denies any SOB, CP, vision changes, LE edema, medication SEs, or symptoms of hypotension  Diabetes Current Regimen: diet controlled Last A1c: 7.4 on 06/15/21  Denies polyuria, polydipsia, hypoglycemia Statin: lipitor 40 mg daily ACE/ARB: n/a  PERTINENT  PMH / PSH: As above.   OBJECTIVE:   BP (!) 146/75    Pulse 60    Wt 186 lb 3.2 oz (84.5 kg)    SpO2 98%    BMI 29.16 kg/m   General: Appears well, no acute distress. Age appropriate. Cardiac: RRR, normal heart sounds, no murmurs Respiratory: CTAB, normal effort Extremities: No edema or cyanosis. Neuro: alert and oriented, no focal deficits Psych: normal affect  ASSESSMENT/PLAN:   1. Hypertension associated with diabetes ([place]) Mildly elevated. Will continue to monitor. Would err on the side of elevated rather than adding medication and causing hypotension. Will repeat BMP, Cr stable when reviewed with Dr. Andria Frames. Can consider adding lisinopril back if appropriate.  - Basic Metabolic Panel  2. Type 2 diabetes mellitus with chronic kidney disease, without long-term current use of insulin, unspecified CKD stage ([place]) At goal. Continue diet controlled regimen.   Of note, would like to have goals of care conversation with daughter with whom he lives. Asked patient to bring daughter to a follow up visit to discussed goals. Could benefit from decreasing medication such as aspirin, statin.   Gerlene Fee, [place]

## 2021-10-11 NOTE — Patient Instructions (Addendum)
C?m ?n b?n ? ??n trong ngy hm nay. Chng ti ? th?o lu?n v? huy?t p.  T?i th?i ?i?m ny, chng ti s? ti?p t?c theo di t?i cc l?n ti khm. Chng ti c?ng ? th?o lu?n v? b?nh ti?u ???ng A1c c?a b?n l 7.0 ?ang ? m?c tiu.  Ti?p t?c ki?m sot ch? ?? ?n u?ng. Chng ti ? th?o lu?n v? vi?c c m?t cu?c tr chuy?n ch?m [place] m?c tiu v?i con gi c?a b?n c m?t vo th? Su c?a thng [employer]c [employer].  Vui lng thi?t l?p ?i?u ny tr??c khi r?i kh?i ngy hm nay.  Thank for coming in today. We discussed blood pressure.  At this time we will continue to monitor at follow-up visits. We also discussed diabetes your A1c is 7.0 which is at goal.  Continue diet control. We discussed having a goals of care conversation with your daughter present on a Friday in January or February.  Please set this up prior to leaving today.  Dr. Janus Molder

## 2021-10-12 ENCOUNTER — Encounter: Payer: Self-pay | Admitting: Physical Therapy

## 2021-10-12 ENCOUNTER — Ambulatory Visit: Payer: Medicare Other | Admitting: Physical Therapy

## 2021-10-12 DIAGNOSIS — G8929 Other chronic pain: Secondary | ICD-10-CM

## 2021-10-12 DIAGNOSIS — M25612 Stiffness of left shoulder, not elsewhere classified: Secondary | ICD-10-CM

## 2021-10-12 DIAGNOSIS — M25512 Pain in left shoulder: Secondary | ICD-10-CM | POA: Diagnosis not present

## 2021-10-12 DIAGNOSIS — M6281 Muscle weakness (generalized): Secondary | ICD-10-CM

## 2021-10-12 LAB — BASIC METABOLIC PANEL
BUN/Creatinine Ratio: 13 (ref 10–24)
BUN: 21 mg/dL (ref 8–27)
CO2: 24 mmol/L (ref 20–29)
Calcium: 9.2 mg/dL (ref 8.6–10.2)
Chloride: 100 mmol/L (ref 96–106)
Creatinine, Ser: 1.6 mg/dL — ABNORMAL HIGH (ref 0.76–1.27)
Glucose: 133 mg/dL — ABNORMAL HIGH (ref 70–99)
Potassium: 3.9 mmol/L (ref 3.5–5.2)
Sodium: 138 mmol/L (ref 134–144)
eGFR: 43 mL/min/{1.73_m2} — ABNORMAL LOW (ref >59)

## 2021-10-12 NOTE — Therapy (Signed)
[place] [place] [address], [place], [address] Phone: [phone]   Fax:  [phone]  PHYSICAL THERAPY DISCHARGE SUMMARY  Visits from Start of Care: 8  Current functional level related to goals / functional outcomes: See assessment/goals   Remaining deficits: See assessment/goals   Education / Equipment: HEP and D/C plans  Patient agrees to discharge. Patient goals were partially met. Patient is being discharged due to being pleased with the current functional level.   Patient Details  Name: Omar Sanders MRN: [id number] Date of Birth: 22-Feb-1941 Referring Provider (PT): Martyn Malay, MD   Encounter Date: 10/12/2021   PT End of Session - 10/12/21 0912     Visit Number 8    Number of Visits 17    Date for PT Re-Evaluation 10/14/21    Authorization Type UHC MCR and MCD    Authorization Time Period FOTO v6, v10    Progress Note Due on Visit 10    PT Start Time 0915    PT Stop Time 0958    PT Time Calculation (min) 43 min    Activity Tolerance Patient tolerated treatment well    Behavior During Therapy [facility] for tasks assessed/performed             Past Medical History:  Diagnosis Date   Abdominal bloating 07/20/2020   Allergy    Anal fissure    Anemia    Anxiety    Arthritis    all over body    Benign prostatic hyperplasia    Bilateral lower extremity edema 03/22/2018   Cataract    removed bilat    Chest pain 02/2018   Chronic kidney disease    Chronic kidney disease, stage 3a ([place]) 01/19/2016   Chronic pain    Colon polyp    Diabetes mellitus without complication ([place])    Falls 05/19/2021   GERD (gastroesophageal reflux disease)    Headache(784.0)    Hemorrhoids    Hepatic steatosis 05/19/2021   Hyperlipidemia    Hx: of   Hypertension    IBS (irritable bowel syndrome)    Incontinent of urine    Hx:of   Migraines    Neuromuscular disorder ([place])    periferal neuropathy per patient   New  onset right bundle branch block (RBBB)    OSA (obstructive sleep apnea) 11/2009   sleep study with mild OSA, pt unwilling to use CPAP   Seizures ([place])    pt states he has never had a seizure    Sleep apnea    mild    Type 2 diabetes mellitus with diabetic chronic kidney disease ([place]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    Past Surgical History:  Procedure Laterality Date   CARDIAC CATHETERIZATION  06/04/2009   mild to moderate CAD  w/o hemodynamic significant lesion   CATARACT EXTRACTION     bilat   CHOLECYSTECTOMY  2008   COLONOSCOPY  2012   Dr Collene Mares   HEMORRHOID SURGERY     anal surgery   INGUINAL HERNIA REPAIR Bilateral 03/11/2013   Procedure: LAPAROSCOPIC BILATERAL INGUINAL HERNIA REPAIR;  Surgeon: Ralene Ok, MD;  Location: [facility];  Service: General;  Laterality: Bilateral;   INSERTION OF MESH Bilateral 03/11/2013   Procedure: INSERTION OF MESH;  Surgeon: Ralene Ok, MD;  Location: [facility];  Service: General;  Laterality: Bilateral;   NM MYOCAR PERF WALL MOTION  05/27/2009   mild iscemia mid inferio & apical regions.   NM MYOCAR PERF WALL MOTION  11/01/2012  negative for ischemia   POLYPECTOMY     TRANSURETHRAL RESECTION OF PROSTATE N/A 02/18/2021   Procedure: TRANSURETHRAL RESECTION OF THE PROSTATE (TURP), BIPOLAR;  Surgeon: Ceasar Mons, MD;  Location: [facility];  Service: Urology;  Laterality: N/A;   US ECHOCARDIOGRAPHY  05/27/2009   mild mitral annular ca+,AOV mildly sclerotic    There were no vitals filed for this visit.   Subjective Assessment - 10/12/21 0919     Subjective Pt reports that he continues to see improvement in his L shoulder pain.  He has been HEP compliant.  His L shoulder pain is currenlty 2/10              Objective:  Shoulder flexion and abduction: 150 degrees  IR/ER/flexion/abduction MMT: 4+/5  FOTO: 3   [facility] Adult PT Treatment/Exercise:   Therapeutic Exercise:  UBE - 2'/2' while taking subjective and planning session with patient L 1.5 Corner  stretch 2x30" Cross arm stretch 2x30'' L Reviewing HEP  Therapeutic Activity - collecting information for goals, checking progress, and reviewing with patient (FOTO takes extended time d/t translation)     PT Short Term Goals - 10/12/21 0945       PT SHORT TERM GOAL #1   Title Pt will report understanding and adherence to his HEP in order to promote independence in the management of his primary impairments.    Baseline HEP provided at eval    Time 4    Period Weeks    Status Achieved    Target Date 09/16/21               PT Long Term Goals - 10/12/21 0935       PT LONG TERM GOAL #1   Title Pt will achieve a FOTO score of 64% or higher in order to demonstrate improved functional ability as it relates to his shoulder impairments.    Baseline 53% 12/14: 64%    Time 8    Period Weeks    Status Achieved    Target Date 10/14/21      PT LONG TERM GOAL #2   Title Pt will achieve L shoulder flexion and abduction AROM of 150 degrees or higher in order to reach into overhead cabinets without limitation.    Baseline 90 degrees, limited by pain    Time 8    Period Weeks    Status Achieved    Target Date 10/14/21      PT LONG TERM GOAL #3   Title Pt will achieve global L shoulder strength of 4+/5 or greater in order to perform heavy yardwork such as raking without limitation.    Baseline See flowsheet    Time 8    Period Weeks    Status Achieved    Target Date 10/14/21      PT LONG TERM GOAL #4   Title Pt will demonstrate ability to lift 5-pound weight overhead in order to perform heavy household chores without limitation.    Baseline Pt reports inability to lift 5 pounds overhead with his L UE.    Time 8    Period Weeks    Status Achieved    Target Date 10/14/21                   Plan - 10/12/21 0957     Clinical Impression Statement Omar Sanders has progressed well with therapy.  Improved impairments include: shoulder strength, shoulder ROM, pain.   Functional improvements include: ability to  reach [facility] with >5# at home.  Progressions needed include: continued work at home with HEP.  Barriers to progress include: none.  Pt with minimal pain at this point, and only with functional IR at 2/10.  If he does not see resolution of pain in 4-8 weeks with HEP he can return PRN with a new referral from his MD.  Please see baseline and/or status section in "Goals" for specific progress on short term and long term goals established at evaluation.  I recommend D/C home with HEP; pt agrees with plan.    PT Treatment/Interventions ADLs/Self Care Home Management;Moist Heat;Therapeutic activities;Therapeutic exercise;Neuromuscular re-education;Manual techniques;Dry needling;Passive range of motion;Patient/family education;Taping    PT Next Visit Plan assess HEP response; mobs and IR stretching; progress strenghtening as able    PT Home Exercise Plan Access Code: [email or web address]             Patient will benefit from skilled therapeutic intervention in order to improve the following deficits and impairments:  Decreased range of motion, Impaired UE functional use, Pain, Decreased mobility, Decreased strength  Visit Diagnosis: Chronic left shoulder pain  Stiffness of left shoulder, not elsewhere classified  Muscle weakness (generalized)     Problem List Patient Active Problem List   Diagnosis Date Noted   Age-related cognitive decline 05/20/2021   Tinnitus aurium, 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   Abnormal hearing screen 05/20/2021   Failed vision screen 05/20/2021   Falls 05/19/2021   Hepatic steatosis 05/19/2021   Vitamin D insufficiency    Foley catheter in place 03/04/2021   BPH with obstruction/lower urinary tract symptoms 02/18/2021   Atopic dermatitis 03/04/2020   Rotator cuff disorder, left 07/28/2019   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   Aortic aneurysm ([place]) 03/26/2018   Type 2 diabetes mellitus with diabetic chronic kidney disease ([place]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    Constipation 06/01/2017   Hyperlipidemia associated with type 2 diabetes mellitus ([place]) 06/14/2016   Dizziness 02/14/2016   Chronic kidney disease, stage 3a ([place]) 01/19/2016   BPH (benign prostatic hyperplasia) 05/06/2015   Generalized anxiety disorder 12/29/2014   GERD 05/19/2010   Irritable bowel syndrome with constipation 05/19/2010   PERSONAL HX COLONIC POLYPS 05/19/2010   Hypertension associated with diabetes ([place]) 04/10/2007   Seasonal allergies 04/10/2007   HIATAL HERNIA 04/10/2007    Mathis Dad, PT 10/12/2021, 9:59 AM  [facility] [address] [place], [place], [address] Phone: [phone]   Fax:  [phone]  Name: Omar Sanders MRN: [id number] Date of Birth: 1941/08/18

## 2021-10-17 ENCOUNTER — Encounter: Payer: Self-pay | Admitting: Family Medicine

## 2021-10-19 ENCOUNTER — Ambulatory Visit: Payer: Medicare Other | Admitting: Physical Therapy

## 2021-10-26 ENCOUNTER — Ambulatory Visit: Payer: Medicare Other

## 2021-11-04 DIAGNOSIS — R3915 Urgency of urination: Secondary | ICD-10-CM | POA: Diagnosis not present

## 2021-11-04 DIAGNOSIS — R3912 Poor urinary stream: Secondary | ICD-10-CM | POA: Diagnosis not present

## 2021-11-11 ENCOUNTER — Other Ambulatory Visit: Payer: Self-pay

## 2021-11-11 DIAGNOSIS — J302 Other seasonal allergic rhinitis: Secondary | ICD-10-CM

## 2021-11-11 MED ORDER — CETIRIZINE HCL 10 MG PO TABS: 10.0000 mg | ORAL_TABLET | Freq: Every day | ORAL | 2 refills | Status: DC

## 2021-12-06 NOTE — Progress Notes (Signed)
Cardiology Office Note:   Date:  12/07/2021  NAME:  Omar Sanders    MRN: [id number] DOB:  1941-06-24   PCP:  Gerlene Fee, DO  Cardiologist:  Evalina Field, MD  Electrophysiologist:  None   Referring MD: Gerlene Fee, DO   Chief Complaint  Patient presents with   Follow-up        History of Present Illness:   Omar Sanders is a 81 y.o. male with a hx of RBBB, dilated aorta, non-obstructive CAD, DM who presents for follow-up.  He reports increased swelling in his legs.  He apparently has not taken his torsemide that often.  He tells me he does not like to take his medications.  He denies any significant shortness of breath.  He does have evidence of volume overload on exam.  He also reports difficulty urinating.  He will see urology.  I did inform him that we need to check labs.  He has exercising with physical therapy.  Still has left-sided chest and arm pain.  Appears to be positional.  We have discussed in the past that this is likely musculoskeletal.  Exercise actually improves his symptoms.  His blood pressure is stable.  He is diabetic.  He again does not like to take his medications.  He reports stable blood glucose values.  Serum cholesterol levels appear to be stable.  Denies any major symptoms in office.  Does have evidence of increased volume.  Problem List 1. 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 HTN 4. 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 HLD -T chol 131, HDL 32, LDL 69, TG 179 6. CAD -non-obstructive CAD 2010 -LHC 2010: 50% mLAD; 40% mRCA -normal MPI 09/16/2020 7. DM -A1c 7.0 8. CKD -eGFR 43  Past Medical History: Past Medical History:  Diagnosis Date   Abdominal bloating 07/20/2020   Allergy    Anal fissure    Anemia    Anxiety    Arthritis    all over body    Benign prostatic hyperplasia    Bilateral lower extremity edema 03/22/2018   Cataract    removed bilat    Chest pain 02/2018   Chronic kidney disease    Chronic kidney  disease, stage 3a ([place]) 01/19/2016   Chronic pain    Colon polyp    Diabetes mellitus without complication ([place])    Falls 05/19/2021   GERD (gastroesophageal reflux disease)    Headache(784.0)    Hemorrhoids    Hepatic steatosis 05/19/2021   Hyperlipidemia    Hx: of   Hypertension    IBS (irritable bowel syndrome)    Incontinent of urine    Hx:of   Migraines    Neuromuscular disorder ([place])    periferal neuropathy per patient   New onset right bundle branch block (RBBB)    OSA (obstructive sleep apnea) 11/2009   sleep study with mild OSA, pt unwilling to use CPAP   Seizures ([place])    pt states he has never had a seizure    Sleep apnea    mild    Type 2 diabetes mellitus with diabetic chronic kidney disease ([place]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    Past Surgical History: Past Surgical History:  Procedure Laterality Date   CARDIAC CATHETERIZATION  06/04/2009   mild to moderate CAD  w/o hemodynamic significant lesion   CATARACT EXTRACTION     bilat   CHOLECYSTECTOMY  2008   COLONOSCOPY  2012   Dr Collene Mares  HEMORRHOID SURGERY     anal surgery   INGUINAL HERNIA REPAIR Bilateral 03/11/2013   Procedure: LAPAROSCOPIC BILATERAL INGUINAL HERNIA REPAIR;  Surgeon: Ralene Ok, MD;  Location: [facility];  Service: General;  Laterality: Bilateral;   INSERTION OF MESH Bilateral 03/11/2013   Procedure: INSERTION OF MESH;  Surgeon: Ralene Ok, MD;  Location: [facility];  Service: General;  Laterality: Bilateral;   NM MYOCAR PERF WALL MOTION  05/27/2009   mild iscemia mid inferio & apical regions.   NM MYOCAR PERF WALL MOTION  11/01/2012   negative for ischemia   POLYPECTOMY     TRANSURETHRAL RESECTION OF PROSTATE N/A 02/18/2021   Procedure: TRANSURETHRAL RESECTION OF THE PROSTATE (TURP), BIPOLAR;  Surgeon: Ceasar Mons, MD;  Location: [facility];  Service: Urology;  Laterality: N/A;   US ECHOCARDIOGRAPHY  05/27/2009   mild mitral annular ca+,AOV mildly sclerotic    Current Medications: Current Meds   Medication Sig   atorvastatin (LIPITOR) 40 MG tablet TAKE 1 TABLET(40 MG) BY MOUTH DAILY   cetirizine (ZYRTEC) 10 MG tablet Take 1 tablet (10 mg total) by mouth daily.   diclofenac Sodium (VOLTAREN) 1 % GEL Apply 2 g topically 4 (four) times daily.   esomeprazole (NEXIUM) 40 MG capsule Take 1 capsule (40 mg total) by mouth 320-60 minutes before breakfast and dinner   finasteride (PROSCAR) 5 MG tablet Take 5 mg by mouth daily.   fluticasone (FLONASE) 50 MCG/ACT nasal spray Place 2 sprays into both nostrils daily. (Patient taking differently: Place 2 sprays into both nostrils daily as needed for allergies.)   gabapentin (NEURONTIN) 100 MG capsule TAKE 1 CAPSULE BY MOUTH 3  TIMES DAILY   halobetasol (ULTRAVATE) 0.05 % cream Apply topically daily.   polyethylene glycol powder (GLYCOLAX/MIRALAX) 17 GM/SCOOP powder TAKE 1 SCOOP(17 GRAM) DAILY FOR CONSTIPATION   Polyvinyl Alcohol-Povidone (REFRESH OP) Apply 1-2 drops to eye as needed.   sennosides-docusate sodium (SENOKOT-S) 8.6-50 MG tablet Take 2 tablets by mouth in the morning and at bedtime.   tamsulosin (FLOMAX) 0.4 MG CAPS capsule Take 0.4 mg by mouth.   torsemide (DEMADEX) 20 MG tablet Take 20 mg by mouth daily.     Allergies:    Pantoprazole, Aspirin, Lubiprostone, and Vitamin c   Social History: Social History   Socioeconomic History   Marital status: Widowed    Spouse name: Not on file   Number of children: 5   Years of education: 16   Highest education level: Bachelor's degree (e.g., BA, AB, BS)  Occupational History   Not on file  Tobacco Use   Smoking status: Never   Smokeless tobacco: Never  Vaping Use   Vaping Use: Never used  Substance and Sexual Activity   Alcohol use: No   Drug use: No   Sexual activity: Not Currently  Other Topics Concern   Not on file  Social History Narrative   Mr Dapolito lives with his son and daughter with the daughter's two children ages.  Two of his daughter's children are off in college  (04/2021)   Mr Lindell has two other children in [place].      Mr Muehl is by himself much of the day as his daughter works long day shifts.    Mr. Hofman cooks, does laundry for family, and housework.    Social Determinants of Health   Financial Resource Strain: Not on file  Food Insecurity: Not on file  Transportation Needs: Not on file  Physical Activity: Not on file  Stress: Not on file  Social Connections: Not on file     Family History: The patient's family history includes Hypertension in his mother. There is no history of Colon cancer, Colon polyps, Esophageal cancer, Rectal cancer, or Stomach cancer.  ROS:   All other ROS reviewed and negative. Pertinent positives noted in the HPI.     EKGs/Labs/Other Studies Reviewed:   The following studies were personally reviewed by me today:  TTE 09/16/2020  1. Left ventricular ejection fraction, by estimation, is 65 to 70%. The  left ventricle has normal function. The left ventricle has no regional  wall motion abnormalities. Left ventricular diastolic parameters are  consistent with Grade I diastolic  dysfunction (impaired relaxation).   2. Right ventricular systolic function is normal. The right ventricular  size is normal. There is normal pulmonary artery systolic pressure.   3. The mitral valve is grossly normal. No evidence of mitral valve  regurgitation. No evidence of mitral stenosis.   4. The aortic valve is normal in structure. Aortic valve regurgitation is  not visualized.   5. Aortic dilatation noted. There is mild dilatation of the ascending  aorta, measuring 45 mm.   NM 09/16/2020 The left ventricular ejection fraction is hyperdynamic (>65%). Nuclear stress EF: 73%. The study is normal. This is a low risk study. There was no ST segment deviation noted during stress. No T wave inversion was noted during stress.  Recent Labs: 02/19/2021: Hemoglobin 11.7; Platelets 201 10/11/2021: BUN 21; Creatinine, Ser 1.60;  Potassium 3.9; Sodium 138   Recent Lipid Panel    Component Value Date/Time   CHOL 131 07/07/2021 1039   TRIG 179 (H) 07/07/2021 1039   HDL 32 (L) 07/07/2021 1039   CHOLHDL 4.1 07/07/2021 1039   CHOLHDL 7.0 (H) 07/12/2015 0938   VLDL 57 (H) 07/12/2015 0938   LDLCALC 69 07/07/2021 1039    Physical Exam:   VS:  BP 128/74    Pulse 61    Ht 5' 7"  (1.702 m)    Wt 185 lb (83.9 kg)    SpO2 97%    BMI 28.98 kg/m    Wt Readings from Last 3 Encounters:  12/07/21 185 lb (83.9 kg)  10/11/21 186 lb 3.2 oz (84.5 kg)  07/07/21 181 lb (82.1 kg)    General: Well nourished, well developed, in no acute distress Head: Atraumatic, normal size  Eyes: PEERLA, EOMI  Neck: Supple, JVD 10-12 cmH2O Endocrine: No thryomegaly Cardiac: Normal S1, S2; RRR; no murmurs, rubs, or gallops Lungs: Clear to auscultation bilaterally, no wheezing, rhonchi or rales  Abd: Soft, nontender, no hepatomegaly  Ext: 1+ pitting edema Musculoskeletal: No deformities, BUE and BLE strength normal and equal Skin: Warm and dry, no rashes   Neuro: Alert and oriented to person, place, time, and situation, CNII-XII grossly intact, no focal deficits  Psych: Normal mood and affect   ASSESSMENT:   Omar Sanders is a 81 y.o. male who presents for the following: 1. RBBB   2. First degree AV block   3. Aortic dilatation (HCC)   4. Coronary artery disease involving native coronary artery of native heart without angina pectoris   5. Mixed hyperlipidemia   6. Leg edema     PLAN:   1. RBBB 2. First degree AV block -No dizziness or lightheadedness.  Continue to monitor.  Echo showed normal LV function.  3. Aortic dilatation (HCC) -Aorta 40 mm on CT scan in 2019.  45 mm on echo.  We will  recheck an echo due to increased volume overload.  We will likely just monitor this.  I suspect this will be stable.  Given his age she would not be a great candidate for aortic repair if he does need surgical threshold.  4. Coronary artery disease  involving native coronary artery of native heart without angina pectoris 5. Mixed hyperlipidemia -History of nonobstructive CAD.  Stress test in 2021 was normal.  Echo was normal.  Denies any symptoms concerning for angina.  He has chronic left shoulder and chest pain.  This is musculoskeletal.  6. Leg edema -He has evidence of increased volume.  He is not taking his torsemide.  He reports difficulty urinating.  He does report prostate issues.  We will check a BMP as well as BNP.  I would also like to recheck an echocardiogram to make sure he is not developing congestive heart failure.  He could be having increased volume due to inability to urinate as well.  He has plans to see urology.  We will get a better idea of what his kidney function is on lab work today.  I have also expressed that he needs to take his torsemide.  He apparently does not like to take his medications.  I think this could also be contributing.      Disposition: Return in about 1 year (around 12/07/2022).  Medication Adjustments/Labs and Tests Ordered: Current medicines are reviewed at length with the patient today.  Concerns regarding medicines are outlined above.  Orders Placed This Encounter  Procedures   Basic metabolic panel   Brain natriuretic peptide   ECHOCARDIOGRAM COMPLETE   No orders of the defined types were placed in this encounter.   Patient Instructions  Medication Instructions:  The current medical regimen is effective;  continue present plan and medications.  *If you need a refill on your cardiac medications before your next appointment, please call your pharmacy*   Lab Work: BMET, BNP today   If you have labs (blood work) drawn today and your tests are completely normal, you will receive your results only by: [place] (if you have MyChart) OR A paper copy in the mail If you have any lab test that is abnormal or we need to change your treatment, we will call you to review the  results.   Testing/Procedures: Echocardiogram - Your physician has requested that you have an echocardiogram. Echocardiography is a painless test that uses sound waves to create images of your heart. It provides your doctor with information about the size and shape of your heart and how well your hearts chambers and valves are working. This procedure takes approximately one hour. There are no restrictions for this procedure. This will be performed at either our [facility] location - [address] [place] location [facility] 2nd floor.    Follow-Up: At [facility], you and your health needs are our priority.  As part of our continuing mission to provide you with exceptional heart care, we have created designated Provider Care Teams.  These Care Teams include your primary Cardiologist (physician) and Advanced Practice Providers (APPs -  Physician Assistants and Nurse Practitioners) who all work together to provide you with the care you need, when you need it.  We recommend signing up for the patient portal called "MyChart".  Sign up information is provided on this After Visit Summary.  MyChart is used to connect with patients for Virtual Visits (Telemedicine).  Patients are able to  view lab/test results, encounter notes, upcoming appointments, etc.  Non-urgent messages can be sent to your provider as well.   To learn more about what you can do with MyChart, go to [email or web address].    Your next appointment:   12 month(s)  The format for your next appointment:   In Person  Provider:   Evalina Field, MD        Time Spent with Patient: I have spent a total of 35 minutes with patient reviewing hospital notes, telemetry, EKGs, labs and examining the patient as well as establishing an assessment and plan that was discussed with the patient.  > 50% of time was spent in direct patient care.  Signed, Addison Naegeli. Audie Box, MD, [place]  [address], [place], [place] [address] [phone]  12/07/2021 10:40 AM

## 2021-12-07 ENCOUNTER — Ambulatory Visit (INDEPENDENT_AMBULATORY_CARE_PROVIDER_SITE_OTHER): Payer: Medicare Other | Admitting: Cardiovascular Disease

## 2021-12-07 ENCOUNTER — Encounter: Payer: Self-pay | Admitting: Cardiovascular Disease

## 2021-12-07 ENCOUNTER — Other Ambulatory Visit: Payer: Self-pay

## 2021-12-07 VITALS — BP 128/74 | HR 61 | Ht 67.0 in | Wt 185.0 lb

## 2021-12-07 DIAGNOSIS — I77819 Aortic ectasia, unspecified site: Secondary | ICD-10-CM

## 2021-12-07 DIAGNOSIS — I4510 Unspecified right bundle-branch block: Secondary | ICD-10-CM | POA: Diagnosis not present

## 2021-12-07 DIAGNOSIS — I440 Atrioventricular block, first degree: Secondary | ICD-10-CM

## 2021-12-07 DIAGNOSIS — I2510 Atherosclerotic heart disease of native coronary artery without angina pectoris: Secondary | ICD-10-CM

## 2021-12-07 DIAGNOSIS — E782 Mixed hyperlipidemia: Secondary | ICD-10-CM | POA: Diagnosis not present

## 2021-12-07 DIAGNOSIS — R600 Localized edema: Secondary | ICD-10-CM

## 2021-12-07 NOTE — Patient Instructions (Signed)
Medication Instructions:  The current medical regimen is effective;  continue present plan and medications.  *If you need a refill on your cardiac medications before your next appointment, please call your pharmacy*   Lab Work: BMET, BNP today   If you have labs (blood work) drawn today and your tests are completely normal, you will receive your results only by: [place] (if you have MyChart) OR A paper copy in the mail If you have any lab test that is abnormal or we need to change your treatment, we will call you to review the results.   Testing/Procedures: Echocardiogram - Your physician has requested that you have an echocardiogram. Echocardiography is a painless test that uses sound waves to create images of your heart. It provides your doctor with information about the size and shape of your heart and how well your hearts chambers and valves are working. This procedure takes approximately one hour. There are no restrictions for this procedure. This will be performed at either our [facility] location - [address], [place] location [facility] 2nd floor.    Follow-Up: At [facility], you and your health needs are our priority.  As part of our continuing mission to provide you with exceptional heart care, we have created designated Provider Care Teams.  These Care Teams include your primary Cardiologist (physician) and Advanced Practice Providers (APPs -  Physician Assistants and Nurse Practitioners) who all work together to provide you with the care you need, when you need it.  We recommend signing up for the patient portal called "MyChart".  Sign up information is provided on this After Visit Summary.  MyChart is used to connect with patients for Virtual Visits (Telemedicine).  Patients are able to view lab/test results, encounter notes, upcoming appointments, etc.  Non-urgent messages can be sent to your provider as well.   To learn more  about what you can do with MyChart, go to [email or web address].    Your next appointment:   12 month(s)  The format for your next appointment:   In Person  Provider:   Evalina Field, MD

## 2021-12-08 LAB — BASIC METABOLIC PANEL
BUN/Creatinine Ratio: 12 (ref 10–24)
BUN: 16 mg/dL (ref 8–27)
CO2: 26 mmol/L (ref 20–29)
Calcium: 8.7 mg/dL (ref 8.6–10.2)
Chloride: 100 mmol/L (ref 96–106)
Creatinine, Ser: 1.37 mg/dL — ABNORMAL HIGH (ref 0.76–1.27)
Glucose: 166 mg/dL — ABNORMAL HIGH (ref 70–99)
Potassium: 3.7 mmol/L (ref 3.5–5.2)
Sodium: 138 mmol/L (ref 134–144)
eGFR: 52 mL/min/{1.73_m2} — ABNORMAL LOW (ref >59)

## 2021-12-08 LAB — BRAIN NATRIURETIC PEPTIDE: BNP: 21.4 pg/mL (ref 0.0–100.0)

## 2021-12-09 ENCOUNTER — Ambulatory Visit (INDEPENDENT_AMBULATORY_CARE_PROVIDER_SITE_OTHER): Payer: Medicare Other | Admitting: Family Medicine

## 2021-12-09 ENCOUNTER — Other Ambulatory Visit: Payer: Self-pay

## 2021-12-09 ENCOUNTER — Other Ambulatory Visit: Payer: Self-pay | Admitting: Family Medicine

## 2021-12-09 ENCOUNTER — Encounter: Payer: Self-pay | Admitting: Family Medicine

## 2021-12-09 VITALS — BP 156/68 | HR 60 | Ht 67.0 in | Wt 184.0 lb

## 2021-12-09 DIAGNOSIS — I152 Hypertension secondary to endocrine disorders: Secondary | ICD-10-CM

## 2021-12-09 DIAGNOSIS — E1159 Type 2 diabetes mellitus with other circulatory complications: Secondary | ICD-10-CM

## 2021-12-09 DIAGNOSIS — G44219 Episodic tension-type headache, not intractable: Secondary | ICD-10-CM | POA: Diagnosis not present

## 2021-12-09 DIAGNOSIS — E1122 Type 2 diabetes mellitus with diabetic chronic kidney disease: Secondary | ICD-10-CM

## 2021-12-09 DIAGNOSIS — M7989 Other specified soft tissue disorders: Secondary | ICD-10-CM | POA: Diagnosis not present

## 2021-12-09 MED ORDER — LOSARTAN POTASSIUM 25 MG PO TABS: 25.0000 mg | ORAL_TABLET | Freq: Every day | ORAL | 1 refills | Status: DC

## 2021-12-09 NOTE — Patient Instructions (Addendum)
It was a pleasure to see you today!  For your leg swelling: try compression stockings with 30 mmHg of pressure for best results For your blood pressure: start taking losartan 25 mg once daily. Measure  your blood pressure every day for a week around the same time of day and bring these measurements to your follow up appointment For your headache: treating your blood pressure may help your headache, or it may not. Please return in 2-3 weeks to check in on this. If you have a terrible headache (worst of your life), new problems with speaking or walking, facial drooping, or one side of your body feels different than the other when touched, please call our office or go to the nearest emergency room for evaluation.  Be Well,  Dr. Chauncey Reading  ? l m?t ni?m vui ?? nhn th?y b?n ngy hm nay!  1. ??i v?i tnh tr?ng s?ng chn: hy th? dng v? nn c p su?t 30 mmHg ?? c k?t qu? t?t nh?t 2. ??i v?i huy?t p c?a b?n: b?t ??u dng losartan 25 mg m?t l?n m?i ngy. ?o huy?t p c?a b?n m?i ngy trong m?t tu?n vo cng m?t th?i ?i?m trong ngy v mang cc k?t qu? ?o ny ??n cu?c h?n ti khm c?a b?n 3. ??i v?i ch?ng ?au ??u c?a b?n: ?i?u tr? huy?t p c th? gip b?n h?t ?au ??u ho?c c th? khng. Vui lng quay l?i sau 2-3 tu?n ?? ki?m tra ?i?u ny. N?u b?n b? ?au ??u d? d?i (t?i t? nh?t trong ??i), cc v?n ?? m?i v? ni ho?c ?i l?i, m?t r? xu?ng ho?c m?t bn c? th? c?a b?n c c?m gic khc v?i bn kia khi ch?m vo, vui lng g?i cho v?n phng c?a chng ti ho?c ??n phng c?p c?u g?n nh?t ?? ???c ?nh gi .  M?nh gi?i nh,  Ti?n s? [employer]

## 2021-12-09 NOTE — Progress Notes (Signed)
° ° °  SUBJECTIVE:   CHIEF COMPLAINT / HPI:   HTN: BP is elevated to 150s. Review of previous Bps shows elevation on several previous occasions to 140s/150s, currently not on any medication. Recently saw cardiologist, Labs were WNL, CKD stable.  Leg Swelling: patient reports daily leg swelling that is not present in the morning. He reports wearing socks that are tall, believes he has compression hose, but they are not graded. BNP WNL, echo upcoming, unlikely to be due to CHF given history.  HA: HEADACHE  Onset: insidious Location: global and sometimes retro-orbital Quality: throbbing and aching Frequency: 3-5x per week Precipitating factors: none, no aura Prior treatment: has tried OTC analgesia such as tylenol and ibuprofen. He uses this occasionally and it sometimes helps.   Associated Symptoms Nausea/vomiting: no  Photophobia/phonophobia: no  Tearing of eyes: no  Sinus pain/pressure: no  Family hx migraine: no  Personal stressors: no  Relation to menstrual cycle: n/a   Red Flags Fever: no  Neck pain/stiffness: no  Vision/speech/swallow/hearing difficulty: no  Focal weakness/numbness: no  Altered mental status: no  Trauma: no  New type of headache: no  Anticoagulant use: no  H/o cancer/HIV/Pregnancy: no   Medical interpreter for [place] used throughout visit, [name]  PERTINENT  PMH / PSH: DM2, HTN, CKD3  OBJECTIVE:   BP (!) 156/68    Pulse 60    Ht 5\' 7"  (1.702 m)    Wt 184 lb (83.5 kg)    BMI 28.82 kg/m   Nursing note and vitals reviewed GEN: elderly [place] man, resting comfortably in chair, NAD, WNWD HEENT: NCAT. PERRLA. Sclera without injection or icterus. MMM. Clear oropharynx. Neck: Supple. No LAD Cardiac: Regular rate and rhythm. Normal S1/S2. No murmurs, rubs, or gallops appreciated. 2+ radial pulses. Lungs: Clear bilaterally to ascultation. No increased WOB, no accessory muscle usage. No w/r/r. Neuro: AOx3  Cranial Nerves II: PERRL.  III,IV,  VI: EOMI without ptosis or diplopia.  V: Facial sensation is symmetric to touch VII: Facial movement is symmetric.  VIII: Hearing is intact to voice X: Palate elevates symmetrically XI: Shoulder shrug is symmetric. XII: Tongue is midline without atrophy or fasciculations.  Motor: Tone is normal. Bulk is normal. 5/5 strength was present in all four extremities.  Sensory: Sensation is symmetric to light touch in the arms and legs. Deep Tendon Reflexes: 2+ and symmetric in the biceps and patellae.  Cerebellar: FNF is intact bilaterally Ext: no edema Psych: Pleasant and appropriate   ASSESSMENT/PLAN:   Type 2 diabetes mellitus with diabetic chronic kidney disease ([place]) A1c stable and controlled at 7% in December 2022. Diet controlled.  Hypertension associated with diabetes ([place]) Currently not on any medication, lisinopril previously d/c'd for CKD change. However, s cr stable. Will start losartan 25 mg. Return in 2 weeks for recheck of bmp and BP.  Leg swelling Bilateral, by history due to dependent positioning as it gets better in the AM. Recommend trying compression stockings with 30 mmHg regularly before using diuretic. Keep follow up appt with cardiology/echo.   Headache Tension type headache. No red flags, however could be complicated by medication overuse. Recommend hydration, will treat hypertension. Patient does not wish to start more than one medication today, but consider ppx for headaches like topiramate or propranolol. Follow up in 2-4 weeks.     Gladys Damme, MD [place]

## 2021-12-11 DIAGNOSIS — M7989 Other specified soft tissue disorders: Secondary | ICD-10-CM | POA: Insufficient documentation

## 2021-12-11 NOTE — Assessment & Plan Note (Signed)
Bilateral, by history due to dependent positioning as it gets better in the AM. Recommend trying compression stockings with 30 mmHg regularly before using diuretic. Keep follow up appt with cardiology/echo.

## 2021-12-11 NOTE — Assessment & Plan Note (Signed)
Tension type headache. No red flags, however could be complicated by medication overuse. Recommend hydration, will treat hypertension. Patient does not wish to start more than one medication today, but consider ppx for headaches like topiramate or propranolol. Follow up in 2-4 weeks.

## 2021-12-11 NOTE — Assessment & Plan Note (Signed)
Currently not on any medication, lisinopril previously d/c'd for CKD change. However, s cr stable. Will start losartan 25 mg. Return in 2 weeks for recheck of bmp and BP.

## 2021-12-11 NOTE — Assessment & Plan Note (Signed)
A1c stable and controlled at 7% in December 2022. Diet controlled.

## 2021-12-16 ENCOUNTER — Other Ambulatory Visit: Payer: Self-pay

## 2021-12-16 DIAGNOSIS — L309 Dermatitis, unspecified: Secondary | ICD-10-CM

## 2021-12-16 MED ORDER — HALOBETASOL PROPIONATE 0.05 % EX CREA: TOPICAL_CREAM | Freq: Every day | CUTANEOUS | 0 refills | Status: DC

## 2021-12-22 ENCOUNTER — Ambulatory Visit (HOSPITAL_COMMUNITY): Payer: Medicare Other | Attending: Cardiology

## 2021-12-22 ENCOUNTER — Other Ambulatory Visit: Payer: Self-pay

## 2021-12-22 DIAGNOSIS — R600 Localized edema: Secondary | ICD-10-CM | POA: Diagnosis not present

## 2021-12-22 LAB — ECHOCARDIOGRAM COMPLETE
Area-P 1/2: 3.23 cm2
P 1/2 time: 800 msec
S' Lateral: 2.1 cm

## 2021-12-23 ENCOUNTER — Telehealth: Payer: Self-pay

## 2021-12-23 NOTE — Telephone Encounter (Signed)
Patient calls nurse line reporting a "swollen ear."   I attempted to call patient back with a [place] interpreter x2 without success.

## 2021-12-26 ENCOUNTER — Telehealth: Payer: Self-pay

## 2021-12-26 DIAGNOSIS — E1159 Type 2 diabetes mellitus with other circulatory complications: Secondary | ICD-10-CM

## 2021-12-26 NOTE — Telephone Encounter (Signed)
Patient calls nurse line requesting to speak with Dr. Chauncey Reading regarding issues with BP medications. Patient reports that losartan has not been working and he has elevated BP readings over the last two days.  12/25/21- 197/100 12/26/21- 168/90, 153/90.  Patient reports that he has been having headache and dizziness for "a long time now." States that he does not remember how long this has been going on for. Patient is requesting to change BP medication back to Lisinopril.   Attempted to schedule patient appointment for follow up. Patient declined at this time and wants to speak with provider regarding concern.    Provided with UC/ ED precautions.   Talbot Grumbling, RN

## 2021-12-26 NOTE — Telephone Encounter (Signed)
I agree with the management and advice given to patient over the phone. 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 He has low health literacy and needs to follow up in person for evaluation. This cannot be done over the phone at all.   Blue team, please schedule this patient for a follow up appointment as soon as possible.  Gladys Damme, MD [facility], PGY-3

## 2021-12-27 NOTE — Telephone Encounter (Signed)
Patient has appt set for 3/31 with Dr. Chauncey Reading. Salvatore Marvel, CMA

## 2022-01-13 ENCOUNTER — Other Ambulatory Visit: Payer: Self-pay | Admitting: Family Medicine

## 2022-01-13 ENCOUNTER — Ambulatory Visit: Payer: Medicare Other | Admitting: Family Medicine

## 2022-01-13 ENCOUNTER — Telehealth: Payer: Self-pay | Admitting: Physician Assistant

## 2022-01-13 DIAGNOSIS — E1159 Type 2 diabetes mellitus with other circulatory complications: Secondary | ICD-10-CM

## 2022-01-13 MED ORDER — LISINOPRIL 20 MG PO TABS: 20.0000 mg | ORAL_TABLET | Freq: Every day | ORAL | 0 refills | Status: DC

## 2022-01-13 MED ORDER — OMEPRAZOLE 40 MG PO CPDR: 40.0000 mg | DELAYED_RELEASE_CAPSULE | Freq: Two times a day (BID) | ORAL | 2 refills | Status: DC

## 2022-01-13 NOTE — Telephone Encounter (Signed)
Patient returns call to nurse line in regards to previous message. Patient reports that losartan does not work and wants to go back to Lisinopril 5 mg. Advised that per previous note that he needs to have evaluation in office.  ? ?Patient states that he has a follow up visit at the end of the month, however, he is not feeling good due to elevated BP. Most recent BP is 179/100. He reports fatigue, dizziness and chest pain. Reports that these symptoms "have been going on for awhile now." ? ?Advised patient with these symptoms he should be seen as soon as possible. Patient states that he does not have transportation to come into the office, UC or the ED. Offered to call patient an ambulance given chest pain. Patient refuses, stating "this has been going on for a long time, all I need is my medication changed." Attempted to schedule patient for appointment next week, patient declines due to transportation. I reiterated the importance of prompt evaluation with the above mentioned symptoms. Patient verbalizes understanding and is still asking to speak with provider regarding medication.  ? ?Forwarding to PCP and Dr. Chauncey Reading (saw patient last in clinic) ? ? ?

## 2022-01-13 NOTE — Telephone Encounter (Signed)
Patient will need higher dose of lisinopril, previously he was on lisinopril 20 mg. It is reasonable to change his medication back to lisinopril in the meantime, however, he MUST follow up on 01/27/22 to get refills and he MUST go to the ED if he has persistent hypertension with headache or vision changes. It is not possible appropriate to manage his hypertension via telephone. ? ?Please call patient with updated information. ? ?Gladys Damme, MD ?[facility], PGY-3  ?

## 2022-01-13 NOTE — Telephone Encounter (Signed)
Lets do Omeprazole 40 bid-thanks JLL  ? ?Called and spoke with patient regarding recommendations. He is aware that I have sent RX for Omeprazole to his local pharmacy on file. Pt verbalized understanding and had no concerns at the end of the call. ? ? ?

## 2022-01-13 NOTE — Telephone Encounter (Signed)
Inbound call from patient stating that he does not want anymore refills for Nexium. Seeking advice if there is something else he can switch to. Please advise.  ?

## 2022-01-13 NOTE — Telephone Encounter (Signed)
Called and spoke with patient. He reports that he is still having reflux symptoms despite Nexium BID dosing. Pt is wondering if there is an alternative medication that can be sent in for him? Please note that patient has intolerance to Protonix. Thanks ?Pantoprazole Medium 05/16/2016   Intolerance Other (See Comments)  ?"Chest feels hot, stomach irritation"  ? ?

## 2022-01-13 NOTE — Addendum Note (Signed)
Addended by: Bridget Hartshorn on: 01/13/2022 01:41 PM ? ? Modules accepted: Orders ? ?

## 2022-01-13 NOTE — Telephone Encounter (Signed)
Patient returns call to nurse line. Patient is adamant that he is supposed to be only taking lisinopril 5 mg. Spoke directly with Dr. Chauncey Reading who reviewed patient's lab results and patient's recent BP readings. Per Dr. Chauncey Reading, patient should be taking lisinopril 20 mg.  ? ?Advised patient of this. Patient then tried to cancel upcoming appointment on 3/31. Advised patient that he still needs to keep this follow up visit to ensure proper BP management. Patient states that he will contact insurance company to facilitate a ride to this visit.  ? ?Reiterated ED precautions.  ? ?Talbot Grumbling, RN ? ?

## 2022-01-16 NOTE — Telephone Encounter (Signed)
Message routed to Dr. Chauncey Reading. Salvatore Marvel, CMA ? ?

## 2022-01-17 NOTE — Telephone Encounter (Signed)
Pt called in today requesting to speak with a nurse regarding questions about his health. Pt was difficult to understand. He stated that he was having chest pain, arm pain, dizziness, and black stools. I asked pt was he taking any iron supplements or pepto bismol, he replied no. Pt was advised that he needed to go to the ER. Pt stated that he was only asking about the Omeprazole, I told him that he should be taking Omeprazole 40 mg BID, I told him that this RX was sent in last week and he should pick it up if he hasn't already. Pt stated that he has the medication, it sounded like he had concerns about the PPI interfering with his CKD. I told pt to reach out to his PCP regarding this. Pt verbalized understanding and had no concerns at the end of the call. ?

## 2022-01-26 NOTE — Progress Notes (Signed)
? ? ?SUBJECTIVE:  ? ?CHIEF COMPLAINT / HPI:  ? ?Hypertension: at last visit, patient had uncontrolled BP to 150s, he was not on any hypertensive medications at that time, though he had previously been on lisinopril 20 mg. He was started on losartan 25 mg and did not tolerate it due to headache and dizziness, which had been present before starting the medication. However, patient adamantly wanted to go back to lisinopril as he had used this before. Patient was supposed to return for 2 week follow up, but did not due to reported transportation issues. He then called with elevated SBP, 150-180s at home for several days and was not on any controller medication as he had self-discontinued the losartan. He needs more intensive BP medication, he requested lisinopril 5 mg and did not want to come in due to transportation issues. However, based on his at home readings, he needed more than one agent and higher doses to control his blood pressure. I recommended he at least start on lisinopril 20 mg and follow up. Today he reports that he has been taking the lisinopril 20 mg, but is concerned about low blood pressure as he has had low blood pressure in the past. Today his BP is below goal: 118/58. He denies any dizziness or headache today. No falls. Will obtain BMP. ? ?Leg swelling: patient has a history of CHF per problem list. 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 He reports that he tried compression stockings once or twice after last visit, and did not have improvement with them. He also reports that his swelling completely resolves with elevating his legs above the level of his heart, for example he raises his legs on pillows at night and the swelling will go away. ? ?DM2: currently diet controlled. Due for A1c, will check today.  ? ?Gerd: patient reports  that he has a history of GERD, knows his trigger foods and tries to avoid them, but has had an increase of GERD lately and would like to have some esomeprazole. He prefers this to omeprazole and previously it has helped him more. ? ?[place] interpreter present ? ?PERTINENT  PMH / PSH: hypertension, CHF ? ?OBJECTIVE:  ? ?BP (!) 118/58   Pulse (!) 58   Ht 5' 7"  (1.702 m)   Wt 183 lb 9.6 oz (83.3 kg)   SpO2 96%   BMI 28.76 kg/m?   ?Nursing note and vitals reviewed ?GEN: elderly, EAM, resting comfortably in chair, NAD, WNWD ?HEENT: NCAT. PERRLA. Sclera without injection or icterus. MMM.  ?Neck: Supple. No LAD. ?Cardiac: Regular rate and rhythm. Normal S1/S2. No murmurs, rubs, or gallops appreciated. 2+ radial pulses. ?Lungs: Clear bilaterally to ascultation. No increased WOB, no accessory muscle usage. No w/r/r. ?Neuro: AOx3  ?Ext: trace edema to ankles ?Psych: Pleasant and appropriate  ? ?ASSESSMENT/PLAN:  ? ?Type 2 diabetes mellitus with diabetic chronic kidney disease ([place]) ?A1c stable today at 7.2%. Diet controlled, no change. His A1c check can be spaced to 6 months. ? ?Hypertension associated with diabetes ([place]) ?Suspect ISH given patient has had significantly elevated SBP, but normal to low DBP. Very common in his age demographic. With known aortic aneurysm and CKD, want to control BP, but not cause harm with hypotension that could lead to falls. BMP WNL, will decrease lisinopril to 10 mg. Goal is  to keep SBP 130-140. Follow up in 2-4 weeks. Goal would be to get highly accurate BP data, patient would be good candidate for ambulatory BP cuff, however earliest availability for that is May. Patient also has transportation issues, will refer to SW to get assistance with transportation. ? ?Chronic kidney disease, stage 3a ([place]) ?CKD 3a chronic, stable. eGFR on BMP 52. ? ?Aortic aneurysm ([place]) ?Noted to be 45 mm on recent echo. Cardiology following. ? ?Leg swelling ?Given recent echo, most likely this is  dependent swelling due to gravity. Discussed elevating legs, walking for exercise, and wearing compression hose. Today his swelling is appropriate, only trace. ?  ? ? ?Gladys Damme, MD ?[place] [facility]  ? ?

## 2022-01-27 ENCOUNTER — Encounter: Payer: Self-pay | Admitting: Family Medicine

## 2022-01-27 ENCOUNTER — Ambulatory Visit (INDEPENDENT_AMBULATORY_CARE_PROVIDER_SITE_OTHER): Payer: Medicare Other | Admitting: Family Medicine

## 2022-01-27 VITALS — BP 118/58 | HR 58 | Ht 67.0 in | Wt 183.6 lb

## 2022-01-27 DIAGNOSIS — Z748 Other problems related to care provider dependency: Secondary | ICD-10-CM | POA: Diagnosis not present

## 2022-01-27 DIAGNOSIS — I7121 Aneurysm of the ascending aorta, without rupture: Secondary | ICD-10-CM | POA: Diagnosis not present

## 2022-01-27 DIAGNOSIS — E1122 Type 2 diabetes mellitus with diabetic chronic kidney disease: Secondary | ICD-10-CM

## 2022-01-27 DIAGNOSIS — E1159 Type 2 diabetes mellitus with other circulatory complications: Secondary | ICD-10-CM

## 2022-01-27 DIAGNOSIS — I5020 Unspecified systolic (congestive) heart failure: Secondary | ICD-10-CM

## 2022-01-27 DIAGNOSIS — I152 Hypertension secondary to endocrine disorders: Secondary | ICD-10-CM | POA: Diagnosis not present

## 2022-01-27 DIAGNOSIS — M7989 Other specified soft tissue disorders: Secondary | ICD-10-CM

## 2022-01-27 DIAGNOSIS — N1831 Chronic kidney disease, stage 3a: Secondary | ICD-10-CM

## 2022-01-27 LAB — POCT GLYCOSYLATED HEMOGLOBIN (HGB A1C): HbA1c, POC (controlled diabetic range): 7.2 % — AB (ref 0.0–7.0)

## 2022-01-27 MED ORDER — ESOMEPRAZOLE MAGNESIUM 40 MG PO CPDR: 40.0000 mg | DELAYED_RELEASE_CAPSULE | Freq: Every day | ORAL | 3 refills | Status: DC

## 2022-01-27 NOTE — Patient Instructions (Addendum)
It was a pleasure to see you today! ? ?Decrease lisinopril to 10 mg (cut in 1/2). Take this every day. ?We will check your A1c today for diabetes ?Follow up in 2 weeks. I will send a request to social work to help arrange transportation ?If you have dizziness, any falls, please stop taking the lisinopril and call our office [phone] ?Follow up in 2 weeks ? ?Be Well, ? ?Dr. Chauncey Reading ? ??? l? m?t ni?m vui ?? nh?n th?y b?n ng?y h?m nay! ? ?1. Gi?m lisinopril xu?ng 10 mg (c?t 1/2). U?ng c?i n?y m?i ng?y. ?2. H?m nay ch?ng t?i s? ki?m tra A1c c?a b?n ?? ph?t hi?n b?nh ti?u ???ng ?3. T?i kh?m sau 2 tu?n. T?i s? g?i y?u c?u ??n c?ng t?c x? h?i ?? gi?p s?p x?p ph??ng ti?n ?i l?i ?4. N?u b?n b? ch?ng m?t, t? ng?, vui l?ng ng?ng d?ng lisinopril v? g?i cho v?n ph?ng c?a ch?ng t?i [phone] ?5. Theo d?i sau 2 tu?n ? ?M?nh gi?i nh?, ? ?Ti?n s? Emelynn Rance ?

## 2022-01-28 LAB — BASIC METABOLIC PANEL
BUN/Creatinine Ratio: 12 (ref 10–24)
BUN: 16 mg/dL (ref 8–27)
CO2: 24 mmol/L (ref 20–29)
Calcium: 9.3 mg/dL (ref 8.6–10.2)
Chloride: 100 mmol/L (ref 96–106)
Creatinine, Ser: 1.33 mg/dL — ABNORMAL HIGH (ref 0.76–1.27)
Glucose: 186 mg/dL — ABNORMAL HIGH (ref 70–99)
Potassium: 4.2 mmol/L (ref 3.5–5.2)
Sodium: 137 mmol/L (ref 134–144)
eGFR: 53 mL/min/{1.73_m2} — ABNORMAL LOW (ref >59)

## 2022-01-28 LAB — BRAIN NATRIURETIC PEPTIDE: BNP: 16.1 pg/mL (ref 0.0–100.0)

## 2022-01-28 NOTE — Assessment & Plan Note (Signed)
Given recent echo, most likely this is dependent swelling due to gravity. Discussed elevating legs, walking for exercise, and wearing compression hose. Today his swelling is appropriate, only trace. ?

## 2022-01-28 NOTE — Assessment & Plan Note (Signed)
CKD 3a chronic, stable. eGFR on BMP 52. ?

## 2022-01-28 NOTE — Assessment & Plan Note (Signed)
Noted to be 45 mm on recent echo. Cardiology following. ?

## 2022-01-28 NOTE — Assessment & Plan Note (Addendum)
Suspect ISH given patient has had significantly elevated SBP, but normal to low DBP. Very common in his age demographic. With known aortic aneurysm and CKD, want to control BP, but not cause harm with hypotension that could lead to falls. BMP WNL, will decrease lisinopril to 10 mg. Goal is to keep SBP 130-140. Follow up in 2-4 weeks. Goal would be to get highly accurate BP data, patient would be good candidate for ambulatory BP cuff, however earliest availability for that is May. Patient also has transportation issues, will refer to SW to get assistance with transportation. ?

## 2022-01-28 NOTE — Assessment & Plan Note (Signed)
A1c stable today at 7.2%. Diet controlled, no change. His A1c check can be spaced to 6 months. ?

## 2022-01-31 ENCOUNTER — Telehealth: Payer: Self-pay | Admitting: *Deleted

## 2022-01-31 NOTE — Chronic Care Management (AMB) (Signed)
?  Care Management  ? ?Outreach Note ? ?01/31/2022 ?Name: DIMAS SCHECK MRN: [id number] DOB: 13-Oct-1941 ? ?Referred by: Gerlene Fee, DO ?Reason for referral : Care Coordination (Initial outreach to schedule referral with BSW) ? ? ?An unsuccessful telephone outreach was attempted today using [place] interpreter Services [phone] name Aaron Edelman. The patient was referred to the case management team for assistance with care management and care coordination.  ? ?Follow Up Plan:  ?A HIPAA compliant phone message was left for the patient providing contact information and requesting a return call.  ?The care management team will reach out to the patient again over the next 7 days. If patient returns call to provider office, please advise to call [place] at [phone]. ? ?Laverda Sorenson  ?Care Guide, Embedded Care Coordination ?[facility]  Care Management  ?Direct Dial: [phone] ? ?

## 2022-02-01 DIAGNOSIS — D631 Anemia in chronic kidney disease: Secondary | ICD-10-CM | POA: Diagnosis not present

## 2022-02-01 DIAGNOSIS — N189 Chronic kidney disease, unspecified: Secondary | ICD-10-CM | POA: Diagnosis not present

## 2022-02-01 DIAGNOSIS — E559 Vitamin D deficiency, unspecified: Secondary | ICD-10-CM | POA: Diagnosis not present

## 2022-02-01 DIAGNOSIS — R609 Edema, unspecified: Secondary | ICD-10-CM | POA: Diagnosis not present

## 2022-02-01 DIAGNOSIS — N1831 Chronic kidney disease, stage 3a: Secondary | ICD-10-CM | POA: Diagnosis not present

## 2022-02-01 DIAGNOSIS — I129 Hypertensive chronic kidney disease with stage 1 through stage 4 chronic kidney disease, or unspecified chronic kidney disease: Secondary | ICD-10-CM | POA: Diagnosis not present

## 2022-02-09 ENCOUNTER — Ambulatory Visit: Payer: Medicare Other | Admitting: Family Medicine

## 2022-02-10 DIAGNOSIS — R3915 Urgency of urination: Secondary | ICD-10-CM | POA: Diagnosis not present

## 2022-02-13 NOTE — Chronic Care Management (AMB) (Signed)
?  Care Management  ? ?Outreach Note ? ?02/13/2022 ?Name: Omar Sanders MRN: [id number] DOB: [birth year] ? ?Referred by: Gerlene Fee, DO ?Reason for referral : Care Coordination (Initial outreach to schedule referral with BSW) ? ? ?A second unsuccessful telephone outreach was attempted today using [place] interpreter services [phone] named Omar Sanders . The patient was referred to the case management team for assistance with care management and care coordination.  ? ?Follow Up Plan:  ?The care management team will reach out to the patient again over the next 7 days.  ?If patient returns call to provider office, please advise to call [place]* at [phone]* ? ?Laverda Sorenson  ?Care Guide, Embedded Care Coordination ?[place]  Care Management  ?Direct Dial: [phone] ? ?

## 2022-02-15 NOTE — Chronic Care Management (AMB) (Signed)
?  Care Management  ? ?Outreach Note ? ?02/15/2022 ?Name: Omar Sanders MRN: [id number] DOB: 08/04/1941 ? ?Referred by: Gerlene Fee, DO ?Reason for referral : Care Coordination (Initial outreach to schedule referral with BSW) ? ? ?Third unsuccessful telephone outreach was attempted today. The patient was referred to the case management team for assistance with care management and care coordination. The patient's primary care provider has been notified of our unsuccessful attempts to make or maintain contact with the patient. The care management team is pleased to engage with this patient at any time in the future should he/she be interested in assistance from the care management team.  ? ?Follow Up Plan:  ?We have been unable to make contact with the patient. The care management team is available to follow up with the patient after provider conversation with the patient regarding recommendation for care management engagement and subsequent re-referral to the care management team.  ?If patient returns call to provider office, please advise to call [place] at [phone]. ? ?Laverda Sorenson  ?Care Guide, Embedded Care Coordination ?[place]  Care Management  ?Direct Dial: [phone] ? ?

## 2022-02-24 ENCOUNTER — Other Ambulatory Visit: Payer: Self-pay

## 2022-02-24 DIAGNOSIS — J302 Other seasonal allergic rhinitis: Secondary | ICD-10-CM

## 2022-02-25 ENCOUNTER — Other Ambulatory Visit: Payer: Self-pay | Admitting: Family Medicine

## 2022-02-25 DIAGNOSIS — J302 Other seasonal allergic rhinitis: Secondary | ICD-10-CM

## 2022-02-27 MED ORDER — CETIRIZINE HCL 10 MG PO TABS: 10.0000 mg | ORAL_TABLET | Freq: Every day | ORAL | 2 refills | Status: AC

## 2022-02-27 NOTE — Telephone Encounter (Signed)
Patient has called again checking on his refill. Please advise.  ? ?Thanks! ?

## 2022-03-03 ENCOUNTER — Ambulatory Visit (INDEPENDENT_AMBULATORY_CARE_PROVIDER_SITE_OTHER): Payer: Medicare Other | Admitting: Family Medicine

## 2022-03-03 ENCOUNTER — Encounter: Payer: Self-pay | Admitting: Family Medicine

## 2022-03-03 VITALS — BP 100/80 | HR 64 | Ht 67.0 in | Wt 186.2 lb

## 2022-03-03 DIAGNOSIS — I152 Hypertension secondary to endocrine disorders: Secondary | ICD-10-CM

## 2022-03-03 DIAGNOSIS — E1159 Type 2 diabetes mellitus with other circulatory complications: Secondary | ICD-10-CM | POA: Diagnosis not present

## 2022-03-03 DIAGNOSIS — R300 Dysuria: Secondary | ICD-10-CM

## 2022-03-03 LAB — POCT URINALYSIS DIP (MANUAL ENTRY)
Bilirubin, UA: NEGATIVE
Blood, UA: NEGATIVE
Glucose, UA: NEGATIVE mg/dL
Ketones, POC UA: NEGATIVE mg/dL
Leukocytes, UA: NEGATIVE
Nitrite, UA: NEGATIVE
Protein Ur, POC: NEGATIVE mg/dL
Spec Grav, UA: <=1.005 — AB (ref 1.010–1.025)
Urobilinogen, UA: 0.2 E.U./dL
pH, UA: 6 (ref 5.0–8.0)

## 2022-03-03 MED ORDER — REFRESH 1.4-0.6 % OP SOLN: 1.0000 [drp] | Freq: Every day | OPHTHALMIC | 1 refills | Status: AC

## 2022-03-03 NOTE — Patient Instructions (Addendum)
-   Your blood pressure is at goal today.  Continue lisinopril 10 mg daily ?- Please pick up your prescriptions from the pharmacy ?-Call [place] at [phone].  ?-Call [facility] to make an appointment at [phone] ?-I will contact you when urinalysis results available ?- Follow-up in September for your next A1c or sooner if needed ? ?

## 2022-03-03 NOTE — Progress Notes (Signed)
? ? ?  SUBJECTIVE:  ? ?CHIEF COMPLAINT / HPI:  ? ?Hypertension: ?- Medications: Lisinopril 10 mg daily ?- Compliance: Yes ?- Checking BP at home: Yes, 130/80-90 ?- Denies any SOB, CP, vision changes, LE edema, medication SEs, or symptoms of hypotension ? ?Dysuria ?Unclear of duration. Hx of BPH. Denies fever, chills, dark urine. Does not have medications today. Unclear if taking flomax or finasteride.  ? 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 ?OBJECTIVE:  ? ?BP 100/80   Pulse 64   Wt 186 lb 3.2 oz (84.5 kg)   SpO2 96%   BMI 29.16 kg/m?  ?Physical Exam ?Vitals reviewed.  ?Constitutional:   ?   General: He is not in acute distress. ?   Appearance: He is not ill-appearing, toxic-appearing or diaphoretic.  ?Pulmonary:  ?   Effort: Pulmonary effort is normal.  ?Neurological:  ?   Mental Status: He is alert and oriented to person, place, and time.  ?Psychiatric:     ?   Mood and Affect: Mood normal.     ?   Behavior: Behavior normal.  ? ?ASSESSMENT/PLAN:  ? ?1. Hypertension associated with diabetes ([place]) ?At goal. Continue current dose of medication.  ?- Basic Metabolic Panel ? ?2. Dysuria ?Unsure of duration. No urology follow up since 01/2021 in media tab and was noted to have a foley catheter 12 months ago 03/04/2021; appt schedule 02/23/2021 for catheter removal. This provider unaware of this until after patient visit. No obvious signs of a foley bag. Will need to clarify if this is still in place. However, number for urology office was given to patient prior to end of the visits so that he may schedule follow up.  ?- POCT urinalysis dipstick ? ?Jacob Cicero Autry-Lott, DO ?[facility]  ?

## 2022-03-04 LAB — BASIC METABOLIC PANEL
BUN/Creatinine Ratio: 15 (ref 10–24)
BUN: 23 mg/dL (ref 8–27)
CO2: 24 mmol/L (ref 20–29)
Calcium: 8.7 mg/dL (ref 8.6–10.2)
Chloride: 99 mmol/L (ref 96–106)
Creatinine, Ser: 1.54 mg/dL — ABNORMAL HIGH (ref 0.76–1.27)
Glucose: 150 mg/dL — ABNORMAL HIGH (ref 70–99)
Potassium: 3.7 mmol/L (ref 3.5–5.2)
Sodium: 136 mmol/L (ref 134–144)
eGFR: 45 mL/min/{1.73_m2} — ABNORMAL LOW (ref >59)

## 2022-03-13 ENCOUNTER — Other Ambulatory Visit: Payer: Self-pay | Admitting: Family Medicine

## 2022-03-13 DIAGNOSIS — J302 Other seasonal allergic rhinitis: Secondary | ICD-10-CM

## 2022-03-14 ENCOUNTER — Other Ambulatory Visit: Payer: Self-pay | Admitting: Family Medicine

## 2022-03-14 DIAGNOSIS — I152 Hypertension secondary to endocrine disorders: Secondary | ICD-10-CM

## 2022-03-14 DIAGNOSIS — J302 Other seasonal allergic rhinitis: Secondary | ICD-10-CM

## 2022-03-14 NOTE — Telephone Encounter (Signed)
Patient called stating he can't leave a message on the nurse line because his language. He is requesting a refill ? ?Lisinopril '20mg'$ .  ? ?Please! ?

## 2022-03-15 MED ORDER — LISINOPRIL 20 MG PO TABS: ORAL_TABLET | ORAL | 0 refills | Status: DC

## 2022-03-16 ENCOUNTER — Other Ambulatory Visit: Payer: Self-pay | Admitting: Family Medicine

## 2022-03-16 DIAGNOSIS — I152 Hypertension secondary to endocrine disorders: Secondary | ICD-10-CM

## 2022-03-16 MED ORDER — LISINOPRIL 20 MG PO TABS: ORAL_TABLET | ORAL | 0 refills | Status: DC

## 2022-03-16 NOTE — Telephone Encounter (Signed)
Patient is needing prescription for Lisinopril sent to [facility] that is on file.   Please advise.  Thanks!

## 2022-04-20 ENCOUNTER — Other Ambulatory Visit: Payer: Self-pay | Admitting: Physician Assistant

## 2022-05-16 ENCOUNTER — Other Ambulatory Visit: Payer: Self-pay | Admitting: Family Medicine

## 2022-05-16 DIAGNOSIS — E1159 Type 2 diabetes mellitus with other circulatory complications: Secondary | ICD-10-CM

## 2022-06-05 ENCOUNTER — Ambulatory Visit: Payer: Medicare Other

## 2022-06-06 ENCOUNTER — Ambulatory Visit (INDEPENDENT_AMBULATORY_CARE_PROVIDER_SITE_OTHER): Payer: Medicare Other | Admitting: Student

## 2022-06-06 VITALS — BP 112/55 | HR 57 | Ht 67.0 in | Wt 177.5 lb

## 2022-06-06 DIAGNOSIS — L989 Disorder of the skin and subcutaneous tissue, unspecified: Secondary | ICD-10-CM | POA: Diagnosis not present

## 2022-06-06 DIAGNOSIS — M25512 Pain in left shoulder: Secondary | ICD-10-CM | POA: Diagnosis not present

## 2022-06-06 DIAGNOSIS — G8929 Other chronic pain: Secondary | ICD-10-CM

## 2022-06-06 DIAGNOSIS — Z5982 Transportation insecurity: Secondary | ICD-10-CM

## 2022-06-06 MED ORDER — DICLOFENAC SODIUM 1 % EX GEL: 4.0000 g | Freq: Four times a day (QID) | CUTANEOUS | 1 refills | Status: DC

## 2022-06-06 NOTE — Patient Instructions (Addendum)
It was great to see you! Thank you for allowing me to participate in your care!  I recommend that you always bring your medications to each appointment as this makes it easy to ensure you are on the correct medications and helps [place] not miss when refills are needed.  Our plans for today:  - I have referred you to physical therapy. They will call to schedule an appointment - I have referred you to our chronic care management team to assist with transportation. They will call you. -I have referred you to sports medicine, they will call to schedule an appointment  -I sent in a prescription for a medication called Voltaren gel to your pharmacy. You can apply this for the shoulder pain up to 4 times a day. You can also take over the counter tylenol as needed.   Take care and seek immediate care sooner if you develop any concerns.   Dr. Precious Gilding, DO [facility]

## 2022-06-06 NOTE — Assessment & Plan Note (Signed)
Patient will return to discuss the skin lesion further and potential need for punch biopsy at later visit.  Patient is scheduled to return at the end of the month.

## 2022-06-06 NOTE — Assessment & Plan Note (Signed)
Due to chronicity of pain, I will refer patient to sports medicine at this time for further work-up.  Patient is reluctant to try oral medications for pain such as Tylenol but is agreeable to use Voltaren gel.  As PT has worked in the past, will refer patient for PT and will also refer patient to CCM for transportation needs.

## 2022-06-06 NOTE — Progress Notes (Signed)
    SUBJECTIVE:   CHIEF COMPLAINT / HPI:   Left shoulder pain Pt states he has had pain of his L shoulder which radiates to left side of chest and  left side of neck. It is worsened with movement for the past 4-5 years. This pain has worsened recently. 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 For the pain, he exercises and uses Icy hot which sometimes helps. He has tried tylenol but this hasn't helped. He has never had this shoulder imaged. He has tried PT in the past which he states did help but transportation is an issue. He has had steroid injections in the past which have helped.   Lesion on right knee Dark pigmented lesion on right knee has been present for over a year.  Does not itch or cause him any discomfort.  Patient is amenable to returning at the end of the month to further discuss this.   PERTINENT  PMH / PSH: L Rotator cuff disorder, frozen shoulder  OBJECTIVE:   BP (!) 112/55   Pulse (!) 57   Ht '5\' 7"'$  (1.702 m)   Wt 177 lb 8 oz (80.5 kg)   SpO2 96%   BMI 27.80 kg/m    General: NAD, pleasant, able to participate in exam Cardiac: Well perfused Respiratory: Breathing comfortably on room air MSK: Full range of motion of flexion of bilateral shoulders, unable to passively or actively abduct left shoulder past 90 degrees, limited adduction of left shoulder, no deformities noted of left shoulder Skin: warm and dry, dark pigmented lesion on right knee, see photo in media tab Neuro: alert, no obvious focal deficits Psych: Normal affect and mood  ASSESSMENT/PLAN:   Chronic left shoulder pain Due to chronicity of pain, I will refer patient to sports medicine at this time for further work-up.  Patient is reluctant to try oral medications for pain such as Tylenol but is agreeable to use Voltaren gel.  As PT has worked in the past, will refer patient for PT and will also refer patient to CCM for transportation needs.  Skin lesion of right leg Patient  will return to discuss the skin lesion further and potential need for punch biopsy at later visit.  Patient is scheduled to return at the end of the month.  Dr. Precious Gilding, [place]

## 2022-06-07 ENCOUNTER — Telehealth: Payer: Self-pay | Admitting: Physician Assistant

## 2022-06-07 NOTE — Telephone Encounter (Signed)
Inbound call from patient stating that his allergist told him to stop taking Omeprazole. Patient is seeking advice on what medication he needs to start taking next. Please advise.

## 2022-06-07 NOTE — Telephone Encounter (Signed)
Returned call to patient. We spoke for about 10 minutes. Pt seemed confused about what he is supposed to be doing regarding Omeprazole prescription and he was hard to understand at times. Pt states that he did not know the name or office of his allergist. He stated that his urologist told him to stop Omeprazole. Pt stated that when he stopped the medication his heartburn returned. I informed pt that he should continue Omeprazole 40 mg BID if it controls his symptoms. Pt knows that he has an additional refill at the pharmacy if he needs it. Pt will contact pharmacy when he needs a refill, he stated that he would call us if he is not able to contact the pharmacy. The pt verbalized understanding and had no concerns at the end of the call.

## 2022-06-13 ENCOUNTER — Telehealth: Payer: Self-pay | Admitting: *Deleted

## 2022-06-13 NOTE — Chronic Care Management (AMB) (Signed)
  Care Coordination  Outreach Note  06/13/2022 Name: Omar Sanders MRN: [id number] DOB: [birth year]   Care Coordination Outreach Attempts  An unsuccessful telephone outreach was attempted today to offer the patient information about available care coordination services as a benefit of their health plan.  [place] services was used to make outreach [phone] named [place].  Follow Up Plan:  Additional outreach attempts will be made to offer the patient care coordination information and services.   Encounter Outcome:  No Answer   [place]  Direct Dial: [phone]

## 2022-06-15 ENCOUNTER — Telehealth: Payer: Self-pay

## 2022-06-15 DIAGNOSIS — G8929 Other chronic pain: Secondary | ICD-10-CM

## 2022-06-15 MED ORDER — DICLOFENAC SODIUM 1 % EX GEL: 4.0000 g | Freq: Four times a day (QID) | CUTANEOUS | 1 refills | Status: DC

## 2022-06-15 NOTE — Telephone Encounter (Signed)
Patient calls nurse line regarding prescription for voltaren gel. Patient is asking that prescription be transferred from [facility] to local [place] on [address].   [place] and canceled prescription.   Pharmacist also had questions regarding prescription due to patient's allergy to aspirin. It appears that this is GI upset, however, will forward to PCP for further clarification.   If appropriate, please send prescription to [facility]. Medication pended.   Talbot Grumbling, RN

## 2022-06-21 NOTE — Chronic Care Management (AMB) (Signed)
  Care Coordination  Outreach Note  06/21/2022 Name: JAYSEN WEY MRN: [id number] DOB: 03/26/1941   Care Coordination Outreach Attempts  A second unsuccessful outreach was attempted today to offer the patient with information about available care coordination services as a benefit of their health plan.     [employer] used [id number] named [place]  Follow Up Plan:  Additional outreach attempts will be made to offer the patient care coordination information and services.   Encounter Outcome:  No Answer  [place]  Direct Dial: [phone]

## 2022-06-23 NOTE — Chronic Care Management (AMB) (Signed)
  Care Coordination  Outreach Note  06/23/2022 Name: Omar Sanders MRN: [id number] DOB: [birth year]   Care Coordination Outreach Attempts  A third unsuccessful outreach was attempted today to offer the patient with information about available care coordination services as a benefit of their health plan.   Follow Up Plan:  No further outreach attempts will be made at this time. We have been unable to contact the patient to offer or enroll patient in care coordination services  Encounter Outcome:  No Answer  [place]: [phone]

## 2022-06-24 NOTE — Progress Notes (Signed)
    SUBJECTIVE:   CHIEF COMPLAINT / HPI:   T2DM Hgb A1c today of 6.7.  Patient is diet controlled.   Skin lesion on R knee Present for over a year.  It started off as a thicker black spot which patient cut off of himself, leaving a dark lesion which has a slightly raised portion.  Does not itch or bother patient.  Idiopathic Nerve Pain Starts in buttocks and shoots down legs and they feel numb, has been going on for very long time.  Was prescribed gabapentin last year but patient did not take the gabapentin and wants to know if he should start taking it.  Headache and Dizziness Has had headache and dizziness most days for past 10 years. Headaches and dizziness occur together and are constant.  He describes headache as being at the very top of his head, difficult time explaining what it feels like but describes it as a "medium level of pain ". With the dizziness, vision is blurry, does not feel like he will pass out but sometimes feels like he could fall. Takes Tylenol sometimes but doesn't seem to help with headaches.   Hyperlipidemia Last lipid panel done on 07/07/2021 showing total cholesterol 131, triglycerides 179 and LDL 69.  Patient takes atorvastatin.  Would like to have his lipids checked today.   PERTINENT  PMH / PSH: Headaches, dizziness, nerve pain  OBJECTIVE:   Vitals:   06/27/22 0927  BP: 114/67  Pulse: (!) 59  SpO2: 99%    General: NAD, pleasant, able to participate in exam Cardiac: RRR, no murmurs. Respiratory: CTAB, normal effort, No wheezes, rales or rhonchi Skin: warm and dry, dark pigmented skin lesion on right knee, well demarcated, with slightly raised portion, see photo Neuro: alert, no obvious focal deficits Psych: Normal affect and mood    ASSESSMENT/PLAN:   Type 2 diabetes A1c under 7 today patient is diet controlled, can check next A1c in 6 months to year.   Skin lesion of right leg There is low suspicion for melanoma as there is a raised  lesion.  It sounds like the used to be a mole there was patient cut off, lesion is consistent with scar tissue.  Idiopathic neuropathy Patient advised that a safe for him to take the gabapentin previously prescribed -Gabapentin 100 mg 3 times daily  Dizziness Dizziness and headaches have been evaluated by several providers over the past several years.  This is very chronic in nature.  Blood pressure is well controlled today.  Orthostatics have been done in the past, have been normal and also have been positive.  I do not think that the dizziness and headaches are related to orthostatic hypotension as they occur all day every day.  I am considering sending patient to vestibular rehab for further evaluation and to help patient learn to cope with the symptoms as I do not think we will be able to stop them.  We will discuss this further at visit on September 19.  Headaches Headache is associated with dizziness and is very chronic in nature, over 10 years.  Advised patient is safe for him to take Tylenol, he can take up to 1 g of Tylenol every 6 hours for pain.  Plan to further evaluate his headaches and discuss other options for relief at visit on September 19.  Dr. Precious Gilding, [place]

## 2022-06-27 ENCOUNTER — Ambulatory Visit (INDEPENDENT_AMBULATORY_CARE_PROVIDER_SITE_OTHER): Payer: Medicare Other | Admitting: Student

## 2022-06-27 ENCOUNTER — Encounter: Payer: Self-pay | Admitting: Student

## 2022-06-27 ENCOUNTER — Other Ambulatory Visit: Payer: Self-pay

## 2022-06-27 VITALS — BP 114/67 | HR 59 | Wt 180.6 lb

## 2022-06-27 DIAGNOSIS — E1169 Type 2 diabetes mellitus with other specified complication: Secondary | ICD-10-CM | POA: Diagnosis not present

## 2022-06-27 DIAGNOSIS — R42 Dizziness and giddiness: Secondary | ICD-10-CM | POA: Diagnosis not present

## 2022-06-27 DIAGNOSIS — E1122 Type 2 diabetes mellitus with diabetic chronic kidney disease: Secondary | ICD-10-CM | POA: Diagnosis not present

## 2022-06-27 DIAGNOSIS — R519 Headache, unspecified: Secondary | ICD-10-CM | POA: Diagnosis not present

## 2022-06-27 DIAGNOSIS — L989 Disorder of the skin and subcutaneous tissue, unspecified: Secondary | ICD-10-CM | POA: Diagnosis not present

## 2022-06-27 DIAGNOSIS — G609 Hereditary and idiopathic neuropathy, unspecified: Secondary | ICD-10-CM | POA: Insufficient documentation

## 2022-06-27 DIAGNOSIS — E785 Hyperlipidemia, unspecified: Secondary | ICD-10-CM | POA: Diagnosis not present

## 2022-06-27 LAB — POCT GLYCOSYLATED HEMOGLOBIN (HGB A1C): HbA1c, POC (controlled diabetic range): 6.7 % (ref 0.0–7.0)

## 2022-06-27 MED ORDER — GABAPENTIN 100 MG PO CAPS: 100.0000 mg | ORAL_CAPSULE | Freq: Three times a day (TID) | ORAL | 0 refills | Status: DC

## 2022-06-27 NOTE — Assessment & Plan Note (Signed)
A1c under 7 today and patient is diet controlled, can check next A1c in 6 months to a year.

## 2022-06-27 NOTE — Assessment & Plan Note (Signed)
There is low suspicion for melanoma as there is a raised lesion.  It sounds like the used to be a mole there was patient cut off, lesion is consistent with scar tissue.

## 2022-06-27 NOTE — Assessment & Plan Note (Signed)
Dizziness and headaches have been evaluated by several providers over the past several years.  This is very chronic in nature.  Blood pressure is well controlled today.  Orthostatics have been done in the past, have been normal and also have been positive.  I do not think that the dizziness and headaches are related to orthostatic hypotension as they occur all day every day.  I am considering sending patient to vestibular rehab for further evaluation and to help patient learn to cope with the symptoms as I do not think we will be able to stop them.  We will discuss this further at visit on September 19.

## 2022-06-27 NOTE — Patient Instructions (Signed)
It was great to see you! Thank you for allowing me to participate in your care!  I recommend that you always bring your medications to each appointment as this makes it easy to ensure you are on the correct medications and helps [place] not miss when refills are needed.  Our plans for today:  - Gabapentin 100 mg 3 times a day - Tylenol 1000 mg every 6 hours  Take care and seek immediate care sooner if you develop any concerns.   Dr. Precious Gilding, DO [facility]

## 2022-06-27 NOTE — Assessment & Plan Note (Signed)
Patient advised that a safe for him to take the gabapentin previously prescribed -Gabapentin 100 mg 3 times daily

## 2022-06-27 NOTE — Assessment & Plan Note (Signed)
Headache is associated with dizziness and is very chronic in nature, over 10 years.  Advised patient is safe for him to take Tylenol, he can take up to 1 g of Tylenol every 6 hours for pain.  Plan to further evaluate his headaches and discuss other options for relief at visit on September 19.

## 2022-06-28 ENCOUNTER — Encounter: Payer: Self-pay | Admitting: Student

## 2022-06-28 LAB — LIPID PANEL
Chol/HDL Ratio: 4.7 ratio (ref 0.0–5.0)
Cholesterol, Total: 128 mg/dL (ref 100–199)
HDL: 27 mg/dL — ABNORMAL LOW (ref >39)
LDL Chol Calc (NIH): 54 mg/dL (ref 0–99)
Triglycerides: 300 mg/dL — ABNORMAL HIGH (ref 0–149)
VLDL Cholesterol Cal: 47 mg/dL — ABNORMAL HIGH (ref 5–40)

## 2022-06-30 ENCOUNTER — Other Ambulatory Visit: Payer: Self-pay | Admitting: Family Medicine

## 2022-06-30 DIAGNOSIS — L309 Dermatitis, unspecified: Secondary | ICD-10-CM

## 2022-07-08 ENCOUNTER — Other Ambulatory Visit: Payer: Self-pay | Admitting: Family Medicine

## 2022-07-08 DIAGNOSIS — J302 Other seasonal allergic rhinitis: Secondary | ICD-10-CM

## 2022-07-18 ENCOUNTER — Ambulatory Visit: Payer: Medicare Other | Admitting: Student

## 2022-07-20 ENCOUNTER — Other Ambulatory Visit: Payer: Self-pay | Admitting: Gastroenterology

## 2022-07-20 ENCOUNTER — Other Ambulatory Visit: Payer: Self-pay | Admitting: Physician Assistant

## 2022-07-21 ENCOUNTER — Other Ambulatory Visit: Payer: Self-pay

## 2022-07-21 MED ORDER — OMEPRAZOLE 40 MG PO CPDR: DELAYED_RELEASE_CAPSULE | ORAL | 2 refills | Status: DC

## 2022-07-27 ENCOUNTER — Ambulatory Visit (INDEPENDENT_AMBULATORY_CARE_PROVIDER_SITE_OTHER): Payer: Medicare Other | Admitting: Student

## 2022-07-27 ENCOUNTER — Encounter: Payer: Self-pay | Admitting: Student

## 2022-07-27 VITALS — BP 119/69 | HR 56 | Ht 67.0 in | Wt 181.6 lb

## 2022-07-27 DIAGNOSIS — R42 Dizziness and giddiness: Secondary | ICD-10-CM

## 2022-07-27 DIAGNOSIS — Z23 Encounter for immunization: Secondary | ICD-10-CM | POA: Diagnosis not present

## 2022-07-27 NOTE — Assessment & Plan Note (Signed)
Flu shot

## 2022-07-27 NOTE — Progress Notes (Signed)
    SUBJECTIVE:   CHIEF COMPLAINT / HPI:   This visit was completed using video interpreter. History was difficult to obtain due to patient not being able to hear interpreter well.  Additionally, difficult to appreciate if interpreter and the patient understood each other.  Dizziness This problem is chronic and has been evaluated by several providers.  PCP saw him in August, at this time did not believe that it was orthostatic in nature-due to randomness of dizziness and headaches.  They were considering to send him to vestibular rehab for further evaluation and help cope with symptoms.  Today the patient reports the dizziness is worse when he stands and squats, and he is unsure if he is taking his medications appropriately.  Denies neurologic symptoms, chest pain, palpitations shortness of breath, loss of consciousness.  Healthcare maintenance He would like to receive a flu shot today.  PERTINENT  PMH / PSH: HTN, diabetes, BPH, CKD 3A  OBJECTIVE:   BP 119/69   Pulse (!) 56   Ht '5\' 7"'$  (1.702 m)   Wt 181 lb 9.6 oz (82.4 kg)   SpO2 98%   BMI 28.44 kg/m    General: Nonacute distress, pleasant HEENT: Normocephalic, atraumatic head.  External ear, canal, TMs normal bilaterally.  External nose normal.  Throat clear, no erythema or exudate.  Dix-Hallpike maneuver negative. Cardio: Regular rate and rhythm, no MRG Respiratory: CTAB, normal work of breathing Neuro: cranial nerves intact, DTR 2/4, 5/5 upper and lower extremity strength.  Orthostatics were completed: Weak signal of 10 mmhg diastolic difference between lying and sitting.  Blood pressure was normal while standing.  ASSESSMENT/PLAN:   Need for immunization against influenza - Flu shot  Dizziness Chronic problem-worse when squatting and standing.  Associated with headache.  Dix-Hallpike negative, benign physical exam.  Orthostatics today were negative, however very weak signal of diastolic difference in lying and sitting of  10 mmHg.  Blood pressure of 119/69 in office today. - Med rec at next appointment - Follow-up in 2 to 4 weeks  Notes for provider at next visit: 1.) Needs complete med rec (Patient was counseled to bring all medications to next visit) 2.) Concern for orthostatic hypotension 3.) Consider vestibular rehab for dizziness 4.) Is hearing impaired, he reports that he did not see audiology when last referred over a year ago.  Consider referral to audiology to assess for hearing aids. 5.)  Encourage follow-up for memory. 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 6.)  He will benefit from top C appointment, however his PCP is Dr. Ronnald Ramp who is currently out on maternity leave.  Leslie Dales, [place]

## 2022-07-27 NOTE — Patient Instructions (Signed)
Th?t tuy?t v?i khi nhn th?y b?n! C?m ?n b?n ? cho php ti tham gia vo s? ch?m [place] c?a b?n!  Ti khuyn b?n nn lun mang theo thu?c c?a mnh ??n m?i cu?c h?n v ?i?u ny gip chng ti d? dng ??m b?o chng ti s? d?ng ?ng thu?c v gip chng ti khng b? l? khi c?n b? sung thm.  K? ho?ch c?a chng ti cho ngy hm nay: - L?n khm sau vui lng mang theo ??y ?? thu?c. - Chng ti s? h?n lu h?n cho l?n gh th?m ti?p theo   Hy quan tm v tm ki?m s? ch?m [place] ngay l?p t?c s?m h?n n?u b?n c b?t k? m?i lo ng?i no. Cc b?n nh? c m?t tr??c gi? h?n 15 pht nh!  It was great to see you! Thank you for allowing me to participate in your care!   I recommend that you always bring your medications to each appointment as this makes it easy to ensure we are on the correct medications and helps [place] not miss when refills are needed.  Our plans for today:  - Please bring all of your medications to next visit. - We will make a longer appointment for next visit   Take care and seek immediate care sooner if you develop any concerns. Please remember to show up 15 minutes before your scheduled appointment time!  Leslie Dales, DO [facility]

## 2022-07-27 NOTE — Assessment & Plan Note (Signed)
Chronic problem-worse when squatting and standing.  Associated with headache.  Dix-Hallpike negative, benign physical exam.  Orthostatics today were negative, however very weak signal of diastolic difference in lying and sitting of 10 mmHg.  Blood pressure of 119/69 in office today. - Med rec at next appointment - Follow-up in 2 to 4 weeks

## 2022-08-11 DIAGNOSIS — R609 Edema, unspecified: Secondary | ICD-10-CM | POA: Diagnosis not present

## 2022-08-11 DIAGNOSIS — I129 Hypertensive chronic kidney disease with stage 1 through stage 4 chronic kidney disease, or unspecified chronic kidney disease: Secondary | ICD-10-CM | POA: Diagnosis not present

## 2022-08-11 DIAGNOSIS — N189 Chronic kidney disease, unspecified: Secondary | ICD-10-CM | POA: Diagnosis not present

## 2022-08-11 DIAGNOSIS — E559 Vitamin D deficiency, unspecified: Secondary | ICD-10-CM | POA: Diagnosis not present

## 2022-08-11 DIAGNOSIS — D631 Anemia in chronic kidney disease: Secondary | ICD-10-CM | POA: Diagnosis not present

## 2022-08-11 DIAGNOSIS — N1831 Chronic kidney disease, stage 3a: Secondary | ICD-10-CM | POA: Diagnosis not present

## 2022-08-21 ENCOUNTER — Telehealth: Payer: Self-pay | Admitting: Physician Assistant

## 2022-08-21 MED ORDER — OMEPRAZOLE 40 MG PO CPDR: DELAYED_RELEASE_CAPSULE | ORAL | 2 refills | Status: DC

## 2022-08-21 NOTE — Telephone Encounter (Signed)
Sent script to patient's pharmacy.

## 2022-08-21 NOTE — Telephone Encounter (Signed)
Requesting refill on Omeprazole

## 2022-08-30 NOTE — Progress Notes (Signed)
    SUBJECTIVE:   CHIEF COMPLAINT / HPI:   Dizziness Has been a long-standing issue. Has seen multiple providers over time for the same. Was seen most recently by Dr. Nelda Bucks about a month ago who recommended that he return for a med rec. He does bring all of his medications in today, including Vitamin D which was not previously on his medication list. He reports taking this for the past 10 years after being diagnosed with low vit D. He finds that this medication in particular worsens his dizziness and makes him somewhat nauseated when he takes it. He is also hard of hearing and feels that his glasses are no longer working for him as his vision has worsened over the years.     OBJECTIVE:   BP 114/71   Pulse 62   Wt 178 lb 12.8 oz (81.1 kg)   SpO2 97%   BMI 28.00 kg/m   Physical Exam Vitals reviewed.  Constitutional:      General: He is not in acute distress. Eyes:     Extraocular Movements: Extraocular movements intact.     Right eye: No nystagmus.     Left eye: No nystagmus.  Cardiovascular:     Rate and Rhythm: Normal rate and regular rhythm.     Heart sounds: No murmur heard. Pulmonary:     Effort: Pulmonary effort is normal.     Breath sounds: Normal breath sounds.  Lymphadenopathy:     Cervical: No cervical adenopathy.  Neurological:     Mental Status: He is alert.     Cranial Nerves: Cranial nerves 2-12 are intact.     Sensory: Sensation is intact.     Motor: Motor function is intact. No pronator drift.     Coordination: Romberg sign negative.     Gait: Gait is intact.      ASSESSMENT/PLAN:   Dizziness Based on patient history and association between vitamin D intake and history, I am somewhat concerned that he may have an element of vit D toxicity, though may expect his calcium to be higher if this were the case. On chart review, it appears he was prescribed Vit D 2000 IU daily in 2018 and directed to stop in 2019. However, based on the history he gives today,  he may have been taking 10,000 IU daily for the past five years which would likely be dose sufficient to cause toxidrome. Even if not true toxicity, it seems he is having unfavorable reaction to the supplements. Will stop vitamin D supplementation immediately.  There is also a possibility that his poor hearing and vision is leading to some degree of disorientation and disequilibrium. Will refer to audiology and ophtho to correct for both of those. Considered vestibular rehab, but he is actually quite steady on his feet to my exam today, unsure of the benefit of rehab if his symptoms are 2/2 to the above factors. If no improvement with the above changes, can consider referral to vestibular rehab. - Referral to ophthalmology - Referral to audiology - Stop Vit D supplement - PCP f/u in 4 weeks     Pearla Dubonnet, MD [place]

## 2022-08-31 ENCOUNTER — Ambulatory Visit (INDEPENDENT_AMBULATORY_CARE_PROVIDER_SITE_OTHER): Payer: Medicare Other | Admitting: Student

## 2022-08-31 ENCOUNTER — Other Ambulatory Visit: Payer: Self-pay

## 2022-08-31 ENCOUNTER — Encounter: Payer: Self-pay | Admitting: Student

## 2022-08-31 VITALS — BP 114/71 | HR 62 | Wt 178.8 lb

## 2022-08-31 DIAGNOSIS — R94120 Abnormal auditory function study: Secondary | ICD-10-CM | POA: Diagnosis not present

## 2022-08-31 DIAGNOSIS — R42 Dizziness and giddiness: Secondary | ICD-10-CM

## 2022-08-31 DIAGNOSIS — Z0101 Encounter for examination of eyes and vision with abnormal findings: Secondary | ICD-10-CM

## 2022-08-31 NOTE — Patient Instructions (Addendum)
Mr. Kurk,  Please Stop taking the vitamin D. I think this is likely causing most of your problems. I will have you see Dr. Ronnald Ramp in a few weeks to see if this helps.  I recommend to get new glasses. We will refer you to an eye doctor in the community. They will call you to set up an appointment. I would also love for you to see the hearing specialists as better hearing may help with orientation.   Pearla Dubonnet, MD

## 2022-08-31 NOTE — Assessment & Plan Note (Addendum)
Based on patient history and association between vitamin D intake and history, I am somewhat concerned that he may have an element of vit D toxicity, though may expect his calcium to be higher if this were the case. On chart review, it appears he was prescribed Vit D 2000 IU daily in 2018 and directed to stop in 2019. However, based on the history he gives today, he may have been taking 10,000 IU daily for the past five years which would likely be dose sufficient to cause toxidrome. Even if not true toxicity, it seems he is having unfavorable reaction to the supplements. Will stop vitamin D supplementation immediately.  There is also a possibility that his poor hearing and vision is leading to some degree of disorientation and disequilibrium. Will refer to audiology and ophtho to correct for both of those. Considered vestibular rehab, but he is actually quite steady on his feet to my exam today, unsure of the benefit of rehab if his symptoms are 2/2 to the above factors. If no improvement with the above changes, can consider referral to vestibular rehab. - Referral to ophthalmology - Referral to audiology - Stop Vit D supplement - PCP f/u in 4 weeks

## 2022-09-10 ENCOUNTER — Other Ambulatory Visit: Payer: Self-pay | Admitting: Student

## 2022-09-10 DIAGNOSIS — L309 Dermatitis, unspecified: Secondary | ICD-10-CM

## 2022-09-20 ENCOUNTER — Other Ambulatory Visit: Payer: Self-pay | Admitting: Family Medicine

## 2022-09-20 ENCOUNTER — Telehealth: Payer: Self-pay

## 2022-09-20 DIAGNOSIS — E1159 Type 2 diabetes mellitus with other circulatory complications: Secondary | ICD-10-CM

## 2022-09-20 NOTE — Telephone Encounter (Signed)
Patient calls nurse line in regards to ophthalmology referral.  He reports he called [facility], however they are not scheduling new patients until August of 2024.  Patient is requesting to be referred elsewhere.   Will forward to referral coordinator.

## 2022-09-20 NOTE — Telephone Encounter (Signed)
Referral faxed to [facility]

## 2022-09-28 NOTE — Progress Notes (Unsigned)
    SUBJECTIVE:   CHIEF COMPLAINT / HPI:   Dizziness Patient last seen on 08/31/2022 for dizziness.  He was advised to stop vitamin D as patient had noted it seemed to cause dizziness.  He was referred to audiology and ophthalmology due to poor hearing and vision changes.  Today patient states since stopping vitamin D, his dizziness has not changed.  He still feels dizzy several times a day, seems to be worse with movement, sometimes worse when he goes to sit down. Has not been to audiologist or ophthalmologist yet.  Notes in the computer shows that they have both tried to call to set up an appointment but he did not hear the phone call.  Whole body pain Patient continues to complain of whole body aches and pains that he has had for several years.  States he takes Tylenol twice a day and uses Voltaren gel twice a day.  Mouth pain Pain in bilateral upper gums for past 2 months, worse with chewing and with cold water.  When he uses warm water to brush his teeth it does not hurt as bad.   PERTINENT  PMH / PSH: Hypertension, aortic aneurysm, anxiety, dizziness, headaches, T2DM  OBJECTIVE:   Vitals:   09/29/22 0849  BP: 115/70  Pulse: 62  Temp: 98.5 F (36.9 C)  SpO2: 97%    General: NAD, pleasant, able to participate in exam Cardiac: Well-perfused Respiratory: Breathing comfortably on room air Neuro: alert, no obvious focal deficits Psych: Normal affect and mood  ASSESSMENT/PLAN:   No problem-specific Assessment & Plan notes found for this encounter.     Dr. Precious Gilding, [place]    {    This will disappear when note is signed, click to select method of visit    :1}

## 2022-09-29 ENCOUNTER — Ambulatory Visit (INDEPENDENT_AMBULATORY_CARE_PROVIDER_SITE_OTHER): Payer: Medicare Other | Admitting: Student

## 2022-09-29 ENCOUNTER — Encounter: Payer: Self-pay | Admitting: Student

## 2022-09-29 VITALS — BP 115/70 | HR 62 | Temp 98.5°F | Ht 67.0 in | Wt 180.4 lb

## 2022-09-29 DIAGNOSIS — R42 Dizziness and giddiness: Secondary | ICD-10-CM | POA: Diagnosis not present

## 2022-09-29 DIAGNOSIS — M25512 Pain in left shoulder: Secondary | ICD-10-CM

## 2022-09-29 DIAGNOSIS — K1379 Other lesions of oral mucosa: Secondary | ICD-10-CM | POA: Insufficient documentation

## 2022-09-29 DIAGNOSIS — E785 Hyperlipidemia, unspecified: Secondary | ICD-10-CM | POA: Diagnosis not present

## 2022-09-29 DIAGNOSIS — K068 Other specified disorders of gingiva and edentulous alveolar ridge: Secondary | ICD-10-CM

## 2022-09-29 DIAGNOSIS — G8929 Other chronic pain: Secondary | ICD-10-CM

## 2022-09-29 MED ORDER — ATORVASTATIN CALCIUM 40 MG PO TABS: ORAL_TABLET | ORAL | 3 refills | Status: DC

## 2022-09-29 MED ORDER — LIDOCAINE 4 % EX PTCH: 1.0000 | MEDICATED_PATCH | CUTANEOUS | 1 refills | Status: DC

## 2022-09-29 NOTE — Assessment & Plan Note (Addendum)
Chronic in nature.  Orthostatic vitals negative today.  Patient advised to continue with plan made with Dr. Joelyn Oms a couple weeks ago and follow-up with audiology and ophthalmology.  He was provided with phone number for ophthalmology and will call them to schedule appointment.  He was notified that audiology had called him previously left of normal voicemail.  He will check his voicemail box at home and his cell phone.

## 2022-09-29 NOTE — Assessment & Plan Note (Signed)
Pain likely dental in nature as it occurs with drinking cold water.  No evidence of sore or infection in the mouth.  Patient is advised to make an appointment with his dentist.

## 2022-09-29 NOTE — Patient Instructions (Addendum)
It was great to see you! Thank you for allowing me to participate in your care!  I recommend that you always bring your medications to each appointment as this makes it easy to ensure you are on the correct medications and helps [place] not miss when refills are needed.  Our plans for today:  -To schedule your ophthalmology (eye doctor) appointment call [facility] at [phone] their address is [address] in [place], [place] [address] -The audiology office did try to call you to schedule an appointment and said they left a voicemail.  I recommend checking your voicemail box at home or in your cell phone to see if they left a callback number. -I recommend scheduling appointment with your dental office to evaluate the pain you are having in your mouth. -For your body aches and pains, continue to use Tylenol.  You can increase the Voltaren gel to 4 times a day.  I have also sent in a prescription for lidocaine patches which you can use on various parts of your body to relieve pain. -Return in 4 months for a follow-up visit  Take care and seek immediate care sooner if you develop any concerns.   Dr. Precious Gilding, DO [facility]

## 2022-09-30 NOTE — Assessment & Plan Note (Signed)
For his likely age-related aches and pains he is advised to continue taking Tylenol, to increase his Voltaren gel to 4 times daily and he was provided with a prescription for lidocaine patches which he can apply to his left shoulder which causes him the most pain.  We can consider steroid injections for shoulder pain in the future if needed.

## 2022-11-02 ENCOUNTER — Ambulatory Visit: Payer: Medicare Other | Attending: Audiology | Admitting: Audiology

## 2022-11-02 DIAGNOSIS — H903 Sensorineural hearing loss, bilateral: Secondary | ICD-10-CM

## 2022-11-02 NOTE — Procedures (Signed)
Outpatient Audiology and [place], [place]  [id number] [phone]  AUDIOLOGICAL  EVALUATION  NAME: Omar Sanders     DOB:   [birth year]      MRN: [id number]                                                                                     DATE: 11/02/2022     REFERENT: Omar Gilding, DO STATUS: Outpatient DIAGNOSIS: Sensorineural hearing loss, bilateral   History: Omar Sanders was seen for an audiological evaluation due to decreased hearing. He was accompanied to the evaluation by a [place] interpreter. Omar Sanders reports decreased hearing in the right ear for many years and reports decreased hearing in the left ear occurring 6 months ago. Omar Sanders reports great difficulty hearing in everyday listening environments. Omar Sanders reports bilateral tinnitus which he describes at "crickets or insects." He reports the tinnitus is constant. Omar Sanders reports dizziness that occurs everyday. Omar Sanders describes the dizziness when he stands up and then reports he has headaches from his dizziness. Omar Sanders reports dizziness when the lighting is poor and in dark environments. Omar Sanders reports right otalgia itchiness. Omar Sanders denies aural fullness.   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   Abnormal hearing screen 05/20/2021   Failed vision screen 05/20/2021   Falls 05/19/2021   Hepatic steatosis 05/19/2021   Foley catheter in place 03/04/2021   BPH with obstruction/lower urinary tract symptoms 02/18/2021   Atopic dermatitis 03/04/2020   Rotator cuff disorder, left 07/28/2019   Chronic left shoulder pain 06/27/2019   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   Aortic aneurysm ([place]) 03/26/2018   Type 2  diabetes mellitus with diabetic chronic kidney disease ([place]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   Constipation 06/01/2017   Hyperlipidemia associated with type 2 diabetes mellitus ([place]) 06/14/2016   Dizziness 02/14/2016   Chronic kidney disease, stage 3a ([place]) 01/19/2016   BPH (benign prostatic hyperplasia) 05/06/2015   Generalized anxiety disorder 12/29/2014   Headache 11/18/2014   GERD 05/19/2010   Irritable bowel syndrome with constipation 05/19/2010   PERSONAL HX COLONIC POLYPS 05/19/2010   Hypertension associated with diabetes ([place]) 04/10/2007   Seasonal allergies 04/10/2007   HIATAL HERNIA 04/10/2007    Evaluation:  Otoscopy showed a clear view of the tympanic membranes, bilaterally Tympanometry results were consistent with no tympanic membrane mobility (Type B), bilaterally.  Audiometric testing was completed using Conventional Audiometry techniques with insert earphones and TDH headphones. Test results are consistent in the right ear with a moderately-severe to profound sensorineural hearing loss and consistent in the left ear with a moderate to profound sensorineural hearing loss. Asymmetry noted at [phone] Hz, worse in the right ear.  Speech Detection Thresholds were obtained at 70 dB HL in the right ear and at 55  dB HL in the left ear. Word Recognition Testing was not completed as English is not Omar Sanders's primary language.  Results:  The test results were reviewed with Omar Sanders via the [place] interpreter. Today's results are consistent in the right ear with a moderately-severe to profound sensorineural hearing loss and consistent in the left ear with a moderate to profound sensorineural hearing loss. Asymmetry noted at [phone] Hz, worse in the right ear.  Omar Sanders will have hearing difficulty in all listening environments. He will benefit from the use of hearing aids. A referral to an ENT was reviewed due to asymmetry.   Recommendations: 1.   Referral to ENT for hearing asymmetry 2.   Communication Needs  Assessment with an Audiologist to discuss amplification.  3.   Monitor hearing sensitivity.    45 minutes spent testing and counseling on results.   If you have any questions please feel free to contact me at [phone].  Omar Sanders Audiologist, Au.D., CCC-A 11/02/2022  10:39 AM  Cc: Omar Gilding, DO

## 2022-11-03 ENCOUNTER — Other Ambulatory Visit: Payer: Self-pay

## 2022-11-03 ENCOUNTER — Other Ambulatory Visit: Payer: Self-pay | Admitting: Student

## 2022-11-03 DIAGNOSIS — L309 Dermatitis, unspecified: Secondary | ICD-10-CM

## 2022-11-09 ENCOUNTER — Telehealth: Payer: Self-pay | Admitting: Student

## 2022-11-09 NOTE — Telephone Encounter (Signed)
Patient called stating he is needing the doctor to call him. He is wanting doctor to tell him where to go for ENT. Per the Audiologist notes from visit on 11/02/2022. She recommends him be referred to an ENT for hearing asymmetry.   Please advise.   Thanks!

## 2022-11-10 ENCOUNTER — Other Ambulatory Visit: Payer: Self-pay | Admitting: Student

## 2022-11-10 DIAGNOSIS — H9193 Unspecified hearing loss, bilateral: Secondary | ICD-10-CM

## 2022-11-10 NOTE — Progress Notes (Signed)
Pt recently went to audiologist and they recommended referral to ENT for hearing asymmetry.

## 2022-12-06 ENCOUNTER — Telehealth: Payer: Self-pay

## 2022-12-06 NOTE — Telephone Encounter (Signed)
Patient calls nurse line regarding allergy medications. He takes Zyrtec daily. He states that he needs more than 1 tablet and is requesting to add claritin.  Advised that we typically do not recommend taking both claritin and zyrtec at the same time. He is adamant that he needs more than one pill for his symptoms.   He reports runny nose, headache, cough, body aches.   Denies fever, chills.   Advised that his symptoms could be related to viral URI and recommended that he schedule appointment for evaluation. Patient states that he only want to see PCP. Scheduled for 2/20.   He is requesting that I send message to provider for further advisement on medications.   Talbot Grumbling, RN

## 2022-12-07 ENCOUNTER — Telehealth: Payer: Self-pay | Admitting: Student

## 2022-12-07 NOTE — Telephone Encounter (Signed)
Saw RN phone note. Called pt and left voice mail. Advised to NOT take more allergy medicine and if he is feeling sick he should make an appointment to be seen even if it cannot be with me. Left clinic phone number and advised to call the clinic back if he would like to speak with me.

## 2022-12-11 ENCOUNTER — Telehealth: Payer: Self-pay | Admitting: Physician Assistant

## 2022-12-11 NOTE — Telephone Encounter (Signed)
Patient is calling wishing to speak with the nurse regarding his medication. Please advise

## 2022-12-11 NOTE — Telephone Encounter (Signed)
Returned call to patient via interpreter services (Interpreter ID: [phone]). Interpreter left message on vm for patient to return call. If patient is wanting refill of Omeprazole he will need an office visit.

## 2022-12-13 MED ORDER — OMEPRAZOLE 40 MG PO CPDR: DELAYED_RELEASE_CAPSULE | ORAL | 0 refills | Status: DC

## 2022-12-13 NOTE — Telephone Encounter (Signed)
Pt returned call. I called interpreter services back (Interpreter ID: [phone]). Pt states that he wanted to know if he need an office visit or if he should stop the medication. I told pt that he should continue his medication, but he is overdue for a follow up appt. Pt requested an appt in March. Pt has been scheduled for a follow up appt with Ellouise Newer, PA-C on Monday, 01/15/23 at 9 am. Pt states that he receives transportation through social security and will call back if this appt won't work. I told patient that I will send him in a 83-monthsupply of Omeprazole but he needs to keep his appt for future refills. Pt verbalized understanding and had no concerns at the end of the call  Omeprazole RX sent to local pharmacy on file, pt confirmed.

## 2022-12-13 NOTE — Telephone Encounter (Signed)
2nd attempt to reach patient via interpreter services (Interpreter ID: [phone]). The interpreter left a message on patient's mobile vm and home phone # [phone].

## 2022-12-18 NOTE — Progress Notes (Signed)
    SUBJECTIVE:   CHIEF COMPLAINT / HPI:   Allergy symptoms Having rhinorrhea, sneezing, itchy eyes.  No fever or chills.  Does have body aches but this seems to be more of a chronic issue.  He is currently taking Zyrtec daily but wonders if he should also be taking loratadine which she states he has at home.  He does have Flonase on his medication list but says he only takes it when he feels congested on occasion.  Chronic pain Sore back, arms, legs.  Seems to be worse at nighttime.  Also has chronic shoulder pain, worse on the left.  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  He is hesitant to use Tylenol.  Does use Voltaren gel and Salonpas patches for pain.  Chronic dizziness Patient was previously referred to audiology and ophthalmology to see if hearing and vision changes were contributing to his dizziness. 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  Audiology recommended a referral to ENT due to hearing asymmetry.  Patient requests for CMA Lollie Marrow to help him make appointments today.  He also has yet to make an appointment with ophthalmology.  HTN Currently takes torsemide 20 mg daily, lisinopril 20 mg daily.    CKD 4 Last saw nephrology in October and had labs done. Can see labs but not full note.  PERTINENT  PMH / PSH: T2DM, HLD, left rotator cuff disorder, CKD 4, dizziness, HTN  OBJECTIVE:   Vitals:   12/19/22 0855  BP: 130/60  Pulse: (!) 58  Temp: 97.7 F (36.5 C)  SpO2: 98%    General: NAD, pleasant, able to participate in exam HEENT: White sclera, clear conjunctive Cardiac: Regular rhythm, bradycardic, no murmurs. Respiratory: CTAB, normal effort, No wheezes, rales or rhonchi Skin: warm and dry Neuro: alert, no obvious focal deficits Psych: Normal affect and mood  ASSESSMENT/PLAN:   Hypertension associated with diabetes  ([place]) Well-controlled -Continue current regimen  CKD (chronic kidney disease), stage IV ([place]) -Will have patient sign release form so we can get records from nephrologist at next visit.  Dizziness Chronic and stable.  Patient advised to continue with plan to make appointment with ophthalmologist and to follow-up with ENT.  Appointment was made for patient with ENT office in March.  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  Have tried gabapentin in the past but patient refused to take.  Advised patient that it is safe to use Tylenol and encouraged him to use it regularly along with his Voltaren gel and Salonpas patches.  Allergies Symptoms of rhinorrhea, sneezing and itchy eyes are consistent with allergies.  As he is not having fever, chills or cough, seems less likely to be URI.  Advised patient he should not take loratadine and Zyrtec at the same time. I Advised him to continue his Zyrtec and to increase his Flonase to every day instead of only when he is congested.   -Patient to return in 4 months for follow-up  Dr. Precious Gilding, [place]

## 2022-12-19 ENCOUNTER — Ambulatory Visit (INDEPENDENT_AMBULATORY_CARE_PROVIDER_SITE_OTHER): Payer: 59 | Admitting: Student

## 2022-12-19 ENCOUNTER — Telehealth: Payer: Self-pay

## 2022-12-19 ENCOUNTER — Encounter: Payer: Self-pay | Admitting: Student

## 2022-12-19 VITALS — BP 130/60 | HR 58 | Temp 97.7°F | Ht 67.0 in | Wt 177.6 lb

## 2022-12-19 DIAGNOSIS — E1159 Type 2 diabetes mellitus with other circulatory complications: Secondary | ICD-10-CM | POA: Diagnosis not present

## 2022-12-19 DIAGNOSIS — G8929 Other chronic pain: Secondary | ICD-10-CM | POA: Diagnosis not present

## 2022-12-19 DIAGNOSIS — N184 Chronic kidney disease, stage 4 (severe): Secondary | ICD-10-CM

## 2022-12-19 DIAGNOSIS — T7840XD Allergy, unspecified, subsequent encounter: Secondary | ICD-10-CM

## 2022-12-19 DIAGNOSIS — I152 Hypertension secondary to endocrine disorders: Secondary | ICD-10-CM

## 2022-12-19 DIAGNOSIS — R42 Dizziness and giddiness: Secondary | ICD-10-CM | POA: Diagnosis not present

## 2022-12-19 DIAGNOSIS — E785 Hyperlipidemia, unspecified: Secondary | ICD-10-CM | POA: Diagnosis not present

## 2022-12-19 MED ORDER — ATORVASTATIN CALCIUM 40 MG PO TABS: ORAL_TABLET | ORAL | 3 refills | Status: DC

## 2022-12-19 NOTE — Telephone Encounter (Signed)
Called patient with appointment for ENT.  [facility] [address] [phone]  February 23, 2023 [id number] am  Patient informed to bring ID and insurance card and to arrive 15 min early.  Ozella Almond, [place]

## 2022-12-19 NOTE — Patient Instructions (Addendum)
It was great to see you! Thank you for allowing me to participate in your care!  I recommend that you always bring your medications to each appointment as this makes it easy to ensure you are on the correct medications and helps [place] not miss when refills are needed.  Our plans for today:  - Use flonase nasal spray every day. You can keep taking cetirizine. DO NOT start taking loratidine.  - Use Tylenol as needed for pain - Use voltaren gel and solanpas for pain - We will continue trying to get you an appointment with an ear doctor. Keep your phone on.  - Make an appointment with the urologist and with the eye doctor  - Return for a follow up visit in 4 months  Take care and seek immediate care sooner if you develop any concerns.   Dr. Precious Gilding, DO [facility]

## 2022-12-20 DIAGNOSIS — G8929 Other chronic pain: Secondary | ICD-10-CM | POA: Insufficient documentation

## 2022-12-20 NOTE — Assessment & Plan Note (Signed)
Chronic and stable.  Patient advised to continue with plan to make appointment with ophthalmologist and to follow-up with ENT.  Appointment was made for patient with ENT office in March.

## 2022-12-20 NOTE — Assessment & Plan Note (Signed)
Well-controlled.  Continue current regimen. 

## 2022-12-20 NOTE — Assessment & Plan Note (Signed)
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  Have tried gabapentin in the past but patient refused to take.  Advised patient that it is safe to use Tylenol and encouraged him to use it regularly along with his Voltaren gel and Salonpas patches.

## 2022-12-20 NOTE — Assessment & Plan Note (Signed)
-  Will have patient sign release form so we can get records from nephrologist at next visit.

## 2022-12-20 NOTE — Assessment & Plan Note (Signed)
Symptoms of rhinorrhea, sneezing and itchy eyes are consistent with allergies.  As he is not having fever, chills or cough, seems less likely to be URI.  Advised patient he should not take loratadine and Zyrtec at the same time. I Advised him to continue his Zyrtec and to increase his Flonase to every day instead of only when he is congested.

## 2022-12-28 ENCOUNTER — Telehealth: Payer: Self-pay | Admitting: Student

## 2022-12-28 NOTE — Telephone Encounter (Signed)
Patient is calling requesting another referral for ophthalmology. He states the last two offices have to long of a waitlist. I explained to him that most offices will have wait list.   He would like for someone to call him and let him know when I new referral is placed and he would like that offices contact information.

## 2023-01-01 ENCOUNTER — Telehealth: Payer: Self-pay

## 2023-01-01 NOTE — Telephone Encounter (Signed)
Chloe from [facility] calls nurse line requesting insurance information and recent Audiogram.  She reports he has an apt on 3/11, however they will need this information prior to his arrival.   [place] and insurance faxed to [place].

## 2023-01-02 NOTE — Telephone Encounter (Signed)
Appointment made with [facility].  Patient will need to provide his insurance cards and photo ID.  Monday April 15th at 3:20pm.  Patient notified.  [place], [place], [place] [address] Located next to [employer]  Phone: [phone]

## 2023-01-02 NOTE — Telephone Encounter (Signed)
Spoke with patient and let him know that I was able to schedule him in April.  Patient declined an interpreter and said that he had an appointment on 01-04-23 with [place] (I called and confirmed this was NOT true).  Patient was given an appointment here with our office on 01-04-23 to explain to him in person the referral process and provide him with the information for [facility].  Patient is complaining of his vision worsening and we will reassess during this visit.  Omar Sanders,CMA

## 2023-01-04 ENCOUNTER — Ambulatory Visit (INDEPENDENT_AMBULATORY_CARE_PROVIDER_SITE_OTHER): Payer: 59 | Admitting: Family Medicine

## 2023-01-04 ENCOUNTER — Encounter: Payer: Self-pay | Admitting: Family Medicine

## 2023-01-04 VITALS — BP 121/60 | HR 54 | Wt 179.0 lb

## 2023-01-04 DIAGNOSIS — R42 Dizziness and giddiness: Secondary | ICD-10-CM

## 2023-01-04 DIAGNOSIS — T7840XD Allergy, unspecified, subsequent encounter: Secondary | ICD-10-CM | POA: Diagnosis not present

## 2023-01-04 DIAGNOSIS — Z0101 Encounter for examination of eyes and vision with abnormal findings: Secondary | ICD-10-CM

## 2023-01-04 MED ORDER — OLOPATADINE HCL 0.2 % OP SOLN: 1.0000 [drp] | Freq: Every day | OPHTHALMIC | 0 refills | Status: AC

## 2023-01-04 NOTE — Assessment & Plan Note (Addendum)
Failed vision screen again today, 20/70, though question full validity given language barrier. Based on conversation and exam, does not appear to have acute stroke, and chronicity of vision problems would support this conclusion. Could consider age-related visual decline (glasses are also very old prescription per patient) vs failed cataracts s/p surgery (though was only performed 2 years ago). He has appointment scheduled with ophthalmology in April for further evaluation.

## 2023-01-04 NOTE — Progress Notes (Signed)
    SUBJECTIVE:   CHIEF COMPLAINT / HPI:   Vision concerns History was collected using a [place] interpreter, though it appears patient's native language is an ancient dialect. Because of this and his also limited English, the interview today was technically difficult.  It appears he has been experiencing itchy eyes and reduced vision. This has been going on for one year. Has also had dizziness for "a long time." Has been using Refresh for his eyes which sometimes helps the itchiness. He has congestion and runny nose when his eyes are itchy. His reduced vision is worse some days and better some days. It is worse on days with allergies. He denies any episodes of complete blackness or absence of vision. He has headaches, but they are not worse recently.   Has a history of failed vision screen and multiple referrals to ophthalmology for diabetic eye exam which do not appear to have been completed. He has been referred to [facility] by our clinic and has an appointment with them scheduled on April 15th at 3:20 PM.  PERTINENT  PMH / PSH: cataract surgery 2 years ago, HTN, T2DM recent A1c 6.7 down from 7.4 a year ago, HLD, aortic aneurysm, CKD4, dizziness set to see ophthalmology and ENT, chronic pain of left shoulder, allergies, GAD  OBJECTIVE:   BP 121/60   Pulse (!) 54   Wt 179 lb (81.2 kg)   SpO2 97%   BMI 28.04 kg/m   General: Alert and oriented, in NAD Skin: Warm, dry, and intact HEENT: NCAT, EOM grossly normal, midline nasal septum Respiratory: Breathing and speaking comfortably on RA Extremities: Moves all extremities grossly equally Neurological: No gross focal deficit, difficult to assess visual fields given language barrier, tracks fingertips well with EOM intact, ambulates well without ataxia Psychiatric: Appropriate mood and affect   ASSESSMENT/PLAN:   Allergies Itchy eyes in context of runny nose and congestion likely allergies. Mild conjunctival injection supports  diagnosis. Has been using Refresh drops, can continue. Start olopatadine 1 drop per eye daily for symptom relief. Follow up as needed.  Failed vision screen Failed vision screen again today, 20/70, though question full validity given language barrier. Based on conversation and exam, does not appear to have acute stroke, and chronicity of vision problems would support this conclusion. Could consider age-related visual decline (glasses are also very old prescription per patient) vs failed cataracts s/p surgery (though was only performed 2 years ago). He has appointment scheduled with ophthalmology in April for further evaluation.  Dizziness Endorses some chronic dizziness along with symptoms today though appears visual concerns have worsened independent of dizziness. He has been evaluated for this in the past and is set to see both audiology and ENT soon.   Health maintenance Consider obtaining care gaps at next visit with PCP.  Ethelene Hal, MD [place]

## 2023-01-04 NOTE — Assessment & Plan Note (Signed)
Endorses some chronic dizziness along with symptoms today though appears visual concerns have worsened independent of dizziness. He has been evaluated for this in the past and is set to see both audiology and ENT soon.

## 2023-01-04 NOTE — Assessment & Plan Note (Signed)
Itchy eyes in context of runny nose and congestion likely allergies. Mild conjunctival injection supports diagnosis. Has been using Refresh drops, can continue. Start olopatadine 1 drop per eye daily for symptom relief. Follow up as needed.

## 2023-01-04 NOTE — Patient Instructions (Addendum)
It was great to see you today! Here's what we talked about:  I have sent in some eye drops you can take to help with itching due to allergies. I have sent those to your pharmacy and they will send to your home.  You have an appointment with: [facility] at the following address on April 15th at 3:20 PM: [facility], [address] [place], [place] [address] Located next to [employer] Phone: [phone]  You also have an appointment with the ear, nose, and throat specialist: [facility] [address] [phone] February 23, 2023 0950 am  You also have an appointment with the aduiologist to test your ears: Encounter Start Encounter End  02/23/2023  9:50 AM 02/23/2023 10:30 AM   [place], Reesa Chew, Au.D. [address] [place] [address] Phone: [phone]  You have another appointment with the stomach doctors on: 01/15/2023 9:00 AM Fabio Asa, Lavone Nian, PA  [place], [place], [place] [address]  Please let me know if you have any other questions.  Dr. Marcha Dutton  ---------------------------------------------  Th?t tuy?t v?i khi ???c g?p b?n ngy hm nay! ?y l nh?ng g chng ti ? ni v?:  Ti ? g?i m?t s? thu?c nh? m?t gip b?n gi?m ng?a do d? ?ng. Ti ? g?i chng ??n hi?u thu?c c?a b?n v h? s? g?i ??n nh b?n.  B?n c cu?c h?n v?i: [facility] t?i ??a ch? sau vo lc 3:20 chi?u ngy 15 thng 4: Trung tm mua s?m thn thi?n, [place] [address] [place], [place] [address] N?m c?nh [employer] ?i?n tho?i: [phone]  B?n c?ng c h?n v?i bc s? chuyn khoa tai m?i h?ng: [place] C?ng tc vin tai m?i h?ng [address] [id number] Ngy 26 thng 4 n?m 2024 09:50 sng  B?n c?ng c h?n v?i bc s? thnh h?c ?? ki?m tra tai c?a mnh: G?p g? B?t ??u G?p g? K?t thc 26/01/2023  9:50 sng 26/01/2023  10:30 sng  Arsenio Loader, Au.D. [address] [place] [address] ?i?n tho?i: [phone]  B?n c m?t cu?c h?n khc v?i bc s? d? dy vo: 18/12/2022 9:00 sng Levin Erp, PA [address], [place], [place] [address]  Vui lng cho ti bi?t n?u b?n c b?t k? cu h?i no khc.  Ti?n s? Caylei Sperry

## 2023-01-15 ENCOUNTER — Ambulatory Visit (INDEPENDENT_AMBULATORY_CARE_PROVIDER_SITE_OTHER): Payer: 59 | Admitting: Physician Assistant

## 2023-01-15 ENCOUNTER — Encounter: Payer: Self-pay | Admitting: Physician Assistant

## 2023-01-15 VITALS — BP 102/60 | HR 52 | Ht 66.5 in | Wt 179.0 lb

## 2023-01-15 DIAGNOSIS — R140 Abdominal distension (gaseous): Secondary | ICD-10-CM

## 2023-01-15 DIAGNOSIS — K219 Gastro-esophageal reflux disease without esophagitis: Secondary | ICD-10-CM | POA: Diagnosis not present

## 2023-01-15 DIAGNOSIS — R1013 Epigastric pain: Secondary | ICD-10-CM | POA: Diagnosis not present

## 2023-01-15 MED ORDER — DEXLANSOPRAZOLE 60 MG PO CPDR: 60.0000 mg | DELAYED_RELEASE_CAPSULE | Freq: Every day | ORAL | 11 refills | Status: DC

## 2023-01-15 MED ORDER — FAMOTIDINE 40 MG PO TABS: 40.0000 mg | ORAL_TABLET | Freq: Every evening | ORAL | 11 refills | Status: DC | PRN

## 2023-01-15 NOTE — Patient Instructions (Addendum)
Stop your Omeprazole   We have sent the following medications to your pharmacy for you to pick up at your convenience: Dexilant 60 mg , Famotidine 40 gm   _______________________________________________________  If your blood pressure at your visit was 140/90 or greater, please contact your primary care physician to follow up on this.  _______________________________________________________  If you are age 84 or older, your body mass index should be between 23-30. Your Body mass index is 28.46 kg/m. If this is out of the aforementioned range listed, please consider follow up with your Primary Care Provider.  If you are age 72 or younger, your body mass index should be between 19-25. Your Body mass index is 28.46 kg/m. If this is out of the aformentioned range listed, please consider follow up with your Primary Care Provider.   ________________________________________________________  The [facility] providers would like to encourage you to use [facility] to communicate with providers for non-urgent requests or questions.  Due to long hold times on the telephone, sending your provider a message by [facility] may be a faster and more efficient way to get a response.  Please allow 48 business hours for a response.  Please remember that this is for non-urgent requests.  _______________________________________________________

## 2023-01-15 NOTE — Progress Notes (Signed)
Chief Complaint: Follow-up GERD  HPI:    Omar Sanders is an 82 year old [place] male with a past medical history as listed below, known to Dr. Henrene Pastor, who presents to clinic today for follow-up of his GERD.    2018 EGD with mild grade a esophagitis otherwise negative.    04/2020 colonoscopy due to a history of adenomatous polyps with 1 mm polyp removed which was a tubular adenoma.  No follow-up recommended due to age.    12/30/2020 patient seen in clinic by Nicoletta Ba, PA-C.  At that time discussed the patient had been given MiraLAX in the past for constipation but at the time of last visit he notes that he did not want to use that because he read he should not think chronic kidney disease.  He had been given a trial of Linzess which had increased his heartburn.  This was discontinued given a trial of Amitiza 24 mcg twice a day.  He stopped this because he is not convinced it was working and has been using Senokot but he is requiring increasing dosage 2 to 3 tablets a day to produce a bowel movement.  At that time wanted to try Cabometyx again.  Describes history of being given several PPIs in the past and not staying on any of them because became convinced that they were not helping.  He had symptoms of bloating, gas, belching and reflux with intermittent cough.  At that time started patient on Nexium 40 mg every morning and antireflux regimen.  For his chronic bloating recommended avoidance of dairy and artificial sweeteners as well as carbonates.  Discussed breath testing for SIBO but patient wanted to try an empiric course of Xifaxan 550 p.o. 3 times daily x14 days.  Also restarted Amitiza 24 mcg p.o. twice a day.  Explained that if patient had no improvement then would consider further imaging/possibly repeat EGD with Dr. Henrene Pastor.    06/24/2021 patient seen in clinic by me and at that time described continued symptoms as above with bloating, belching and reflux.  At that time was using Nexium 40 mg once  daily with no change.  He was scheduled for an EGD.  Patient never had EGD.    Today, the patient tells me he has been taking Omeprazole 40 mg twice daily and feels like he still gets symptoms intermittently throughout the day.  This has occurred for years.  He is a poor historian and hard to follow even with the use of an interpreter.  He would like to try something else.  Associated symptoms include occasional epigastric pain and bloating.    Denies fever, chills or weight loss.     Past Medical History:  Diagnosis Date   Abdominal bloating 07/20/2020   Allergy    Anal fissure    Anemia    Anxiety    Arthritis    all over body    Benign prostatic hyperplasia    Bilateral lower extremity edema 03/22/2018   Cataract    removed bilat    Chest pain 02/2018   Chronic kidney disease    Chronic kidney disease, stage 3a ([place]) 01/19/2016   Chronic pain    Colon polyp    Diabetes mellitus without complication ([place])    Falls 05/19/2021   GERD (gastroesophageal reflux disease)    Headache(784.0)    Hemorrhoids    Hepatic steatosis 05/19/2021   Hyperlipidemia    Hx: of   Hypertension    IBS (irritable bowel syndrome)  Incontinent of urine    Hx:of   Migraines    Neuromuscular disorder (HCC)    periferal neuropathy per patient   New onset right bundle branch block (RBBB)    OSA (obstructive sleep apnea) 11/2009   sleep study with mild OSA, pt unwilling to use CPAP   Seizures ([place])    pt states he has never had a seizure    Sleep apnea    mild    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    Past Surgical History:  Procedure Laterality Date   CARDIAC CATHETERIZATION  06/04/2009   mild to moderate CAD  w/o hemodynamic significant lesion   CATARACT EXTRACTION     bilat   CHOLECYSTECTOMY  2008   COLONOSCOPY  2012   Dr Collene Mares   HEMORRHOID SURGERY     anal surgery   INGUINAL HERNIA REPAIR Bilateral 03/11/2013   Procedure: LAPAROSCOPIC BILATERAL  INGUINAL HERNIA REPAIR;  Surgeon: Ralene Ok, MD;  Location: [place];  Service: General;  Laterality: Bilateral;   INSERTION OF MESH Bilateral 03/11/2013   Procedure: INSERTION OF MESH;  Surgeon: Ralene Ok, MD;  Location: [place];  Service: General;  Laterality: Bilateral;   NM MYOCAR PERF WALL MOTION  05/27/2009   mild iscemia mid inferio & apical regions.   NM MYOCAR PERF WALL MOTION  11/01/2012   negative for ischemia   POLYPECTOMY     TRANSURETHRAL RESECTION OF PROSTATE N/A 02/18/2021   Procedure: TRANSURETHRAL RESECTION OF THE PROSTATE (TURP), BIPOLAR;  Surgeon: Ceasar Mons, MD;  Location: [facility];  Service: Urology;  Laterality: N/A;   US ECHOCARDIOGRAPHY  05/27/2009   mild mitral annular ca+,AOV mildly sclerotic    Current Outpatient Medications  Medication Sig Dispense Refill   acetaminophen (TYLENOL) 500 MG tablet Take 500 mg by mouth every 6 (six) hours as needed.     atorvastatin (LIPITOR) 40 MG tablet TAKE 1 TABLET(40 MG) BY MOUTH DAILY 90 tablet 3   bisacodyl (DULCOLAX) 5 MG EC tablet Take 5 mg by mouth daily as needed for moderate constipation.     cetirizine (ZYRTEC) 10 MG tablet Take 1 tablet (10 mg total) by mouth daily. 30 tablet 2   Cholecalciferol (VITAMIN D3) 250 MCG (10000 UT) TABS Take by mouth.     diclofenac Sodium (VOLTAREN) 1 % GEL Apply 4 g topically 4 (four) times daily. 350 g 1   finasteride (PROSCAR) 5 MG tablet Take 5 mg by mouth daily.     fluticasone (FLONASE) 50 MCG/ACT nasal spray SHAKE LIQUID AND USE 2 SPRAYS IN EACH NOSTRIL DAILY AS NEEDED FOR ALLERGIES 48 g 0   halobetasol (ULTRAVATE) 0.05 % cream APPLY TOPICALLY TO THE AFFECTED AREA DAILY 50 g 0   lidocaine (HM LIDOCAINE PATCH) 4 % Place 1 patch onto the skin daily. 30 patch 1   lisinopril (ZESTRIL) 20 MG tablet TAKE 1 TABLET(20 MG) BY MOUTH AT BEDTIME 90 tablet 3   mirabegron ER (MYRBETRIQ) 50 MG TB24 tablet Take 50 mg by mouth daily.     Olopatadine HCl 0.2 % SOLN Apply 1 drop to  eye daily. Apply to both eyes once daily. 2.5 mL 0   omeprazole (PRILOSEC) 40 MG capsule TAKE 1 CAPSULE(40 MG) BY MOUTH TWICE DAILY BEFORE A MEAL 60 capsule 0   polyethylene glycol powder (GLYCOLAX/MIRALAX) 17 GM/SCOOP powder TAKE 1 SCOOP(17 GRAM) DAILY FOR CONSTIPATION 510 g 0   Polyvinyl Alcohol-Povidone PF (REFRESH) 1.4-0.6 % SOLN Apply 1  drop to eye daily. 50 each 1   tamsulosin (FLOMAX) 0.4 MG CAPS capsule Take 0.4 mg by mouth.     torsemide (DEMADEX) 20 MG tablet Take 20 mg by mouth daily.     No current facility-administered medications for this visit.    Allergies as of 01/15/2023 - Review Complete 01/15/2023  Allergen Reaction Noted   Pantoprazole Other (See Comments) 05/16/2016   Aspirin Other (See Comments)    Lubiprostone Other (See Comments) 05/12/2016   Vitamin c Other (See Comments) 10/30/2012    Family History  Problem Relation Age of Onset   Hypertension Mother    Colon cancer Neg Hx    Colon polyps Neg Hx    Esophageal cancer Neg Hx    Rectal cancer Neg Hx    Stomach cancer Neg Hx     Social History   Socioeconomic History   Marital status: Widowed    Spouse name: Not on file   Number of children: 5   Years of education: 16   Highest education level: Bachelor's degree (e.g., BA, AB, BS)  Occupational History   Not on file  Tobacco Use   Smoking status: Never   Smokeless tobacco: Never  Vaping Use   Vaping Use: Never used  Substance and Sexual Activity   Alcohol use: No   Drug use: No   Sexual activity: Not Currently  Other Topics Concern   Not on file  Social History Narrative   Omar Sanders lives with his son and daughter with the daughter's two children ages.  Two of his daughter's children are off in college (04/2021)   Omar Sanders has two other children in [place].      Omar Sanders is by himself much of the day as his daughter works long day shifts.    Omar. Sanders cooks, does laundry for family, and housework.    Social Determinants of Health    Financial Resource Strain: Not on file  Food Insecurity: Not on file  Transportation Needs: Unmet Transportation Needs (04/23/2019)   PRAPARE - Hydrologist (Medical): Yes    Lack of Transportation (Non-Medical): Yes  Physical Activity: Not on file  Stress: Not on file  Social Connections: Not on file  Intimate Partner Violence: Not on file    Review of Systems:    Constitutional: No weight loss, fever or chills Cardiovascular: No chest pain Respiratory: No SOB  Gastrointestinal: See HPI and otherwise negative   Physical Exam:  Vital signs: BP 102/60   Pulse (!) 52   Ht 5' 6.5" (1.689 m)   Wt 179 lb (81.2 kg)   BMI 28.46 kg/m    Constitutional:   Pleasant [place] male appears to be in NAD, Well developed, Well nourished, alert and cooperative Respiratory: Respirations even and unlabored. Lungs clear to auscultation bilaterally.   No wheezes, crackles, or rhonchi.  Cardiovascular: Normal S1, S2. No MRG. Regular rate and rhythm. No peripheral edema, cyanosis or pallor.  Gastrointestinal:  Soft, nondistended, nontender. No rebound or guarding. Normal bowel sounds. No appreciable masses or hepatomegaly. Rectal:  Not performed.  Psychiatric: Demonstrates good judgement and reason without abnormal affect or behaviors.  MOST RECENT LABS AND IMAGING: CBC    Component Value Date/Time   WBC 7.8 02/19/2021 0314   RBC 4.38 02/19/2021 0314   HGB 11.7 (L) 02/19/2021 0314   HGB 14.1 01/20/2020 1334   HCT 36.6 (L) 02/19/2021 0314   HCT 44.7 01/20/2020 1334   PLT  201 02/19/2021 0314   PLT 202 01/20/2020 1334   MCV 83.6 02/19/2021 0314   MCV 84 01/20/2020 1334   MCH 26.7 02/19/2021 0314   MCHC 32.0 02/19/2021 0314   RDW 14.1 02/19/2021 0314   RDW 17.4 (H) 01/20/2020 1334   LYMPHSABS 2.0 12/24/2019 1442   MONOABS 0.4 11/22/2019 1148   EOSABS 0.1 12/24/2019 1442   BASOSABS 0.0 12/24/2019 1442    CMP     Component Value Date/Time   NA 136  03/03/2022 1013   K 3.7 03/03/2022 1013   CL 99 03/03/2022 1013   CO2 24 03/03/2022 1013   GLUCOSE 150 (H) 03/03/2022 1013   GLUCOSE 107 (H) 02/18/2021 1100   BUN 23 03/03/2022 1013   CREATININE 1.54 (H) 03/03/2022 1013   CREATININE 1.55 (H) 07/31/2016 1502   CALCIUM 8.7 03/03/2022 1013   PROT 7.4 11/22/2019 1148   PROT 6.9 07/15/2019 1536   ALBUMIN 3.4 (L) 11/22/2019 1148   ALBUMIN 4.1 07/15/2019 1536   AST 29 11/22/2019 1148   ALT 25 11/22/2019 1148   ALKPHOS 52 11/22/2019 1148   BILITOT 0.9 11/22/2019 1148   BILITOT 0.4 07/15/2019 1536   GFRNONAA 54 (L) 02/18/2021 1100   GFRNONAA 43 (L) 07/31/2016 1502   GFRAA 55 (L) 08/23/2020 1129   GFRAA 49 (L) 07/31/2016 1502    Assessment: 1.  GERD: With bloating and occasional epigastric pain, currently uncontrolled on Omeprazole 40 mg twice daily, previously tried on Nexium and Pantoprazole without change in symptoms, poor historian, recommended EGD in the past which he did not do; likely uncontrolled gastritis +/- SIBO which was suspected in the past  Plan: 1.  We will try patient on Dexilant 60 mg daily #30 with 11 refills 2.  Also Famotidine 40 mg nightly as needed #30 with 11 refills 3.  Discontinue Omeprazole 4.  Reviewed antireflux diet and lifestyle modifications 5.  Patient to follow in clinic with me in 2 to 3 months.  If no change will reconsider EGD.  Ellouise Newer, PA-C [facility] 01/15/2023, 9:05 AM  Cc: Precious Gilding, DO

## 2023-01-15 NOTE — Progress Notes (Signed)
Assessment and plan noted ?

## 2023-01-19 ENCOUNTER — Other Ambulatory Visit: Payer: Self-pay | Admitting: Student

## 2023-01-19 DIAGNOSIS — J302 Other seasonal allergic rhinitis: Secondary | ICD-10-CM

## 2023-02-05 DIAGNOSIS — N183 Chronic kidney disease, stage 3 unspecified: Secondary | ICD-10-CM | POA: Diagnosis not present

## 2023-02-13 DIAGNOSIS — N1832 Chronic kidney disease, stage 3b: Secondary | ICD-10-CM | POA: Diagnosis not present

## 2023-02-13 DIAGNOSIS — R609 Edema, unspecified: Secondary | ICD-10-CM | POA: Diagnosis not present

## 2023-02-13 DIAGNOSIS — I129 Hypertensive chronic kidney disease with stage 1 through stage 4 chronic kidney disease, or unspecified chronic kidney disease: Secondary | ICD-10-CM | POA: Diagnosis not present

## 2023-02-13 DIAGNOSIS — E559 Vitamin D deficiency, unspecified: Secondary | ICD-10-CM | POA: Diagnosis not present

## 2023-02-13 DIAGNOSIS — D631 Anemia in chronic kidney disease: Secondary | ICD-10-CM | POA: Diagnosis not present

## 2023-02-15 DIAGNOSIS — R3915 Urgency of urination: Secondary | ICD-10-CM | POA: Diagnosis not present

## 2023-02-23 DIAGNOSIS — G8929 Other chronic pain: Secondary | ICD-10-CM | POA: Diagnosis not present

## 2023-02-23 DIAGNOSIS — H903 Sensorineural hearing loss, bilateral: Secondary | ICD-10-CM | POA: Diagnosis not present

## 2023-02-23 DIAGNOSIS — R42 Dizziness and giddiness: Secondary | ICD-10-CM | POA: Diagnosis not present

## 2023-02-23 DIAGNOSIS — R519 Headache, unspecified: Secondary | ICD-10-CM | POA: Diagnosis not present

## 2023-02-23 DIAGNOSIS — L299 Pruritus, unspecified: Secondary | ICD-10-CM | POA: Diagnosis not present

## 2023-03-02 ENCOUNTER — Ambulatory Visit: Payer: Self-pay | Admitting: Student

## 2023-03-02 ENCOUNTER — Encounter: Payer: Self-pay | Admitting: Family Medicine

## 2023-03-02 ENCOUNTER — Ambulatory Visit (INDEPENDENT_AMBULATORY_CARE_PROVIDER_SITE_OTHER): Payer: 59 | Admitting: Family Medicine

## 2023-03-02 VITALS — BP 118/63 | HR 52 | Temp 97.5°F | Ht 67.0 in | Wt 179.2 lb

## 2023-03-02 DIAGNOSIS — I152 Hypertension secondary to endocrine disorders: Secondary | ICD-10-CM | POA: Diagnosis not present

## 2023-03-02 DIAGNOSIS — E1169 Type 2 diabetes mellitus with other specified complication: Secondary | ICD-10-CM | POA: Diagnosis not present

## 2023-03-02 DIAGNOSIS — E1159 Type 2 diabetes mellitus with other circulatory complications: Secondary | ICD-10-CM

## 2023-03-02 DIAGNOSIS — E785 Hyperlipidemia, unspecified: Secondary | ICD-10-CM | POA: Diagnosis not present

## 2023-03-02 DIAGNOSIS — L309 Dermatitis, unspecified: Secondary | ICD-10-CM

## 2023-03-02 DIAGNOSIS — G8929 Other chronic pain: Secondary | ICD-10-CM

## 2023-03-02 DIAGNOSIS — M25512 Pain in left shoulder: Secondary | ICD-10-CM | POA: Diagnosis not present

## 2023-03-02 DIAGNOSIS — E1122 Type 2 diabetes mellitus with diabetic chronic kidney disease: Secondary | ICD-10-CM | POA: Diagnosis not present

## 2023-03-02 LAB — POCT GLYCOSYLATED HEMOGLOBIN (HGB A1C): HbA1c, POC (controlled diabetic range): 6.8 % (ref 0.0–7.0)

## 2023-03-02 MED ORDER — DICLOFENAC SODIUM 1 % EX GEL: 4.0000 g | Freq: Four times a day (QID) | CUTANEOUS | 2 refills | Status: DC

## 2023-03-02 MED ORDER — ATORVASTATIN CALCIUM 40 MG PO TABS: ORAL_TABLET | ORAL | 3 refills | Status: DC

## 2023-03-02 MED ORDER — HALOBETASOL PROPIONATE 0.05 % EX CREA: TOPICAL_CREAM | CUTANEOUS | 2 refills | Status: DC

## 2023-03-02 NOTE — Patient Instructions (Addendum)
Th?t tuy?t v?i khi ???c g?p b?n ngy hm nay!  Hm nay chng ta ? th?o lu?n v? b?nh ti?u ???ng v huy?t p c?a b?n. C? hai ??u nhn t?t! A1c c?a b?n l 6,8. Hy ti?p t?c ?n nh?ng b?a ?n lnh m?nh v cn b?ng, k?t h?p nhi?u rau.  Vui lng ti?p t?c dng t?t c? cc lo?i thu?c c?a b?n, ti ? g?i thm cc lo?i thu?c b?n mu?n. N?u b?n c?n n?p thm thu?c, vui lng g?i cho phng khm c?a chng ti theo s? [phone].  Hm nay chng ta s? xt nghi?m mu ?? ki?m tra cholesterol v ch?c n?ng th?n c?a b?n.  Vui lng g?i trong vng vi thng t?i ?? s?p x?p chuy?n th?m bc s? Jones vo thng 9, ti khng th? th?c hi?n vi?c ny ngay by gi? v v?n ch?a c l?ch trnh.  Vui lng theo di cu?c h?n ti?p theo c?a b?n, n?u c b?t k? ?i?u g pht sinh t? by gi? ??n lc ?, vui lng lin h? v?i v?n phng c?a chng ti.   C?m ?n b?n ? cho php chng ti tr? thnh m?t ph?n trong vi?c ch?m [place] y t? c?a b?n!  C?m ?n, Ti?n s? Robyne Peers   It was great seeing you today!  Today we discussed your diabetes and blood pressure. Both look good! Your A1c is 6.8. Please continue to eat healthy and balanced meals that incorporate a lot of vegetables.   Please continue to take all your medications, I have sent in refills of your desired medications. If you happen to need more refills then please call our clinic at [phone].   We will get blood work today to check your cholesterol and kidney function.  Please call within the next few months to schedule a visit to see Dr. Yetta Barre in September, I am unable to do this now as the schedule is not out yet.   Please follow up at your next scheduled appointment, if anything arises between now and then, please don't hesitate to contact our office.   Thank you for allowing [place] to be a part of your medical care!  Thank you, Dr. Robyne Peers

## 2023-03-02 NOTE — Assessment & Plan Note (Addendum)
-  BP at goal, well-controlled -continue lisinopril 20 mg daily -BMP today to assess electrolytes and renal function -DM diet-controlled, A1c 6.8 at goal of <7. -diet and exercise counseling provided  -med rec reviewed and updated appropriately -patient states that he is unable to schedule his follow up until September, I explained to him that I was unable to schedule then at this time as the schedule is not out yet. I offered to schedule him earlier but he politely declined. Patient is aware that he will call to schedule an appointment for September in a few months when schedule becomes available

## 2023-03-02 NOTE — Progress Notes (Signed)
    SUBJECTIVE:   CHIEF COMPLAINT / HPI:   Patient with history of DM and hypertension. Seeing ophthalmologist May 7th.  Denies chest pain, dyspnea and leg swelling. Compliant on his current medication regimen and tolerating this well. Wanting refills today and wants to know if he can follow up again with his PCP in September as he tells me that his schedule is very busy and he is unable to be seen sooner.   OBJECTIVE:   BP 118/63   Pulse (!) 52   Temp (!) 97.5 F (36.4 C) (Oral)   Ht 5\' 7"  (1.702 m)   Wt 179 lb 4 oz (81.3 kg)   SpO2 100%   BMI 28.07 kg/m   General: Patient well-appearing, in no acute distress. CV: RRR, no murmurs or gallops auscultated Resp: CTAB, no wheezing, rales or rhonchi noted Ext: no LE edema noted bilaterally  ASSESSMENT/PLAN:   Hypertension associated with diabetes (HCC) -BP at goal, well-controlled -continue lisinopril 20 mg daily -BMP today to assess electrolytes and renal function -DM diet-controlled, A1c 6.8 at goal of <7. -diet and exercise counseling provided  -med rec reviewed and updated appropriately -patient states that he is unable to schedule his follow up until September, I explained to him that I was unable to schedule then at this time as the schedule is not out yet. I offered to schedule him earlier but he politely declined. Patient is aware that he will call to schedule an appointment for September in a few months when schedule becomes available   Hyperlipidemia associated with type 2 diabetes mellitus (HCC) -pending lipid panel -continue statin  -det and exercise counseling provided     -PHQ-9 score of 0 reviewed. -Utilized teach back method and thoroughly reviewed AVS multiple times with [place] interpretor.  -Video Vietnamese interpretation [phone]) utilized throughout the entirety of this encounter.   Reece Leader, DO [facility]

## 2023-03-02 NOTE — Assessment & Plan Note (Signed)
-  pending lipid panel -continue statin  -det and exercise counseling provided

## 2023-03-03 LAB — LIPID PANEL
Chol/HDL Ratio: 4.2 ratio (ref 0.0–5.0)
Cholesterol, Total: 108 mg/dL (ref 100–199)
HDL: 26 mg/dL — ABNORMAL LOW (ref >39)
LDL Chol Calc (NIH): 40 mg/dL (ref 0–99)
Triglycerides: 275 mg/dL — ABNORMAL HIGH (ref 0–149)
VLDL Cholesterol Cal: 42 mg/dL — ABNORMAL HIGH (ref 5–40)

## 2023-03-03 LAB — BASIC METABOLIC PANEL
BUN/Creatinine Ratio: 11 (ref 10–24)
BUN: 17 mg/dL (ref 8–27)
CO2: 20 mmol/L (ref 20–29)
Calcium: 9 mg/dL (ref 8.6–10.2)
Chloride: 100 mmol/L (ref 96–106)
Creatinine, Ser: 1.57 mg/dL — ABNORMAL HIGH (ref 0.76–1.27)
Glucose: 116 mg/dL — ABNORMAL HIGH (ref 70–99)
Potassium: 4.3 mmol/L (ref 3.5–5.2)
Sodium: 134 mmol/L (ref 134–144)
eGFR: 43 mL/min/{1.73_m2} — ABNORMAL LOW (ref >59)

## 2023-03-06 DIAGNOSIS — E119 Type 2 diabetes mellitus without complications: Secondary | ICD-10-CM | POA: Diagnosis not present

## 2023-03-12 ENCOUNTER — Encounter: Payer: Self-pay | Admitting: Family Medicine

## 2023-03-21 NOTE — Progress Notes (Signed)
Cardiology Office Note:   Date:  03/23/2023  NAME:  Omar Sanders    MRN: [id number] DOB:  12-24-1940   PCP:  Omar Alley, DO  Cardiologist:  Omar Harps, Sanders  Electrophysiologist:  None   Referring Sanders: Omar Alley, DO   Chief Complaint  Patient presents with   Follow-up   History of Present Illness:   Omar Sanders is a 82 y.o. male with a hx of CAD, HTN, DM, CKD, Asc Ao aneurysm who presents follow-up.  He reports he is getting dizzy.  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  No syncopal episodes.  BP also 110/54.  Reports when he stands up he can get lightheaded.  Still describes right-sided chest tightness.  Can have left shoulder tightness.  He had stress test in the past that are normal.  Lipids seem to be fairly controlled except for triglycerides.  A1c is improving.  He reports no low sugar spells.  CV exam remains unremarkable.  TTE with asc ao 45 mm. Needs non-contrast chest CT.   Problem List 1. 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 HTN 4. 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 HLD -T chol 108, HDL 26, LDL 40, TG 275 6. CAD -non-obstructive CAD 2010 -LHC 2010: 50% mLAD; 40% mRCA -normal MPI 09/16/2020 7. DM -A1c 6.8 8. CKD -eGFR 43  Past Medical History: Past Medical History:  Diagnosis Date   Abdominal bloating 07/20/2020   Allergy    Anal fissure    Anemia    Anxiety    Arthritis    all over body    Benign prostatic hyperplasia    Bilateral lower extremity edema 03/22/2018   Cataract    removed bilat    Chest pain 02/2018   Chronic kidney disease    Chronic kidney disease, stage 3a (HCC) 01/19/2016   Chronic pain    Colon polyp    Diabetes mellitus without complication (HCC)    Falls 05/19/2021   GERD (gastroesophageal reflux disease)    Headache(784.0)    Hemorrhoids    Hepatic steatosis 05/19/2021   Hyperlipidemia    Hx: of    Hypertension    IBS (irritable bowel syndrome)    Incontinent of urine    Hx:of   Migraines    Neuromuscular disorder (HCC)    periferal neuropathy per patient   New onset right bundle branch block (RBBB)    OSA (obstructive sleep apnea) 11/2009   sleep study with mild OSA, pt unwilling to use CPAP   Seizures (HCC)    pt states he has never had a seizure    Sleep apnea    mild    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    Past Surgical History: Past Surgical History:  Procedure Laterality Date   CARDIAC CATHETERIZATION  06/04/2009   mild to moderate CAD  w/o hemodynamic significant lesion   CATARACT EXTRACTION     bilat   CHOLECYSTECTOMY  2008   COLONOSCOPY  2012   Omar Sanders   HEMORRHOID SURGERY     anal surgery   INGUINAL HERNIA REPAIR Bilateral 03/11/2013   Procedure: LAPAROSCOPIC BILATERAL INGUINAL HERNIA REPAIR;  Surgeon: Omar Sanders;  Location: [facility] OR;  Service: General;  Laterality: Bilateral;   INSERTION OF MESH Bilateral 03/11/2013   Procedure: INSERTION  OF MESH;  Surgeon: Omar Sanders;  Location: [facility] OR;  Service: General;  Laterality: Bilateral;   NM MYOCAR PERF WALL MOTION  05/27/2009   mild iscemia mid inferio & apical regions.   NM MYOCAR PERF WALL MOTION  11/01/2012   negative for ischemia   POLYPECTOMY     TRANSURETHRAL RESECTION OF PROSTATE N/A 02/18/2021   Procedure: TRANSURETHRAL RESECTION OF THE PROSTATE (TURP), BIPOLAR;  Surgeon: Omar Sanders;  Location: [facility];  Service: Urology;  Laterality: N/A;   US ECHOCARDIOGRAPHY  05/27/2009   mild mitral annular ca+,AOV mildly sclerotic    Current Medications: Current Meds  Medication Sig   lisinopril (ZESTRIL) 5 MG tablet Take 1 tablet (5 mg total) by mouth daily.     Allergies:    Pantoprazole, Aspirin, Lubiprostone, and Vitamin c   Social History: Social History   Socioeconomic History   Marital status: Widowed    Spouse name: Not on file    Number of children: 5   Years of education: 16   Highest education level: Bachelor's degree (e.g., BA, AB, BS)  Occupational History   Not on file  Tobacco Use   Smoking status: Never   Smokeless tobacco: Never  Vaping Use   Vaping Use: Never used  Substance and Sexual Activity   Alcohol use: No   Drug use: No   Sexual activity: Not Currently  Other Topics Concern   Not on file  Social History Narrative   Omar Sanders lives with his son and daughter with the daughter's two children ages.  Two of his daughter's children are off in college (04/2021)   Omar Sanders has two other children in [place].      Omar Sanders is by himself much of the day as his daughter works long day shifts.    Omar. Sanders cooks, does laundry for family, and housework.    Social Determinants of Health   Financial Resource Strain: Not on file  Food Insecurity: Not on file  Transportation Needs: Unmet Transportation Needs (04/23/2019)   PRAPARE - Administrator, Civil Service (Medical): Yes    Lack of Transportation (Non-Medical): Yes  Physical Activity: Not on file  Stress: Not on file  Social Connections: Not on file     Family History: The patient's family history includes Hypertension in his mother. There is no history of Colon cancer, Colon polyps, Esophageal cancer, Rectal cancer, or Stomach cancer.  ROS:   All other ROS reviewed and negative. Pertinent positives noted in the HPI.     EKGs/Labs/Other Studies Reviewed:   The following studies were personally reviewed by me today:  EKG:  EKG is ordered today.  The ekg ordered today demonstrates sinus bradycardia heart rate 54, first-degree AV block, right bundle branch block, and was personally reviewed by me.   TTE 12/22/2021  1. Left ventricular ejection fraction, by estimation, is 60 to 65%. The  left ventricle has normal function. The left ventricle has no regional  wall motion abnormalities. Left ventricular diastolic parameters are   consistent with Grade I diastolic  dysfunction (impaired relaxation).   2. Right ventricular systolic function is normal. The right ventricular  size is normal. There is normal pulmonary artery systolic pressure.   3. The mitral valve is normal in structure. Mild mitral valve  regurgitation. No evidence of mitral stenosis.   4. Tricuspid valve regurgitation is mild to moderate.   5. The aortic valve is normal in structure. Aortic valve regurgitation  is  mild. No aortic stenosis is present.   6. Aneurysm of the ascending aorta, measuring 45 mm.   7. The inferior vena cava is normal in size with greater than 50%  respiratory variability, suggesting right atrial pressure of 3 mmHg.   Recent Labs: 03/02/2023: BUN 17; Creatinine, Ser 1.57; Potassium 4.3; Sodium 134   Recent Lipid Panel    Component Value Date/Time   CHOL 108 03/02/2023 1220   TRIG 275 (H) 03/02/2023 1220   HDL 26 (L) 03/02/2023 1220   CHOLHDL 4.2 03/02/2023 1220   CHOLHDL 7.0 (H) 07/12/2015 0938   VLDL 57 (H) 07/12/2015 0938   LDLCALC 40 03/02/2023 1220    Physical Exam:   VS:  BP (!) 110/54 (BP Location: Left Arm, Patient Position: Sitting, Cuff Size: Normal)   Pulse (!) 54   Ht 5\' 5"  (1.651 m)   Wt 178 lb 3.2 oz (80.8 kg)   SpO2 96%   BMI 29.65 kg/m    Wt Readings from Last 3 Encounters:  03/23/23 178 lb 3.2 oz (80.8 kg)  03/02/23 179 lb 4 oz (81.3 kg)  01/15/23 179 lb (81.2 kg)    General: Well nourished, well developed, in no acute distress Head: Atraumatic, normal size  Eyes: PEERLA, EOMI  Neck: Supple, no JVD Endocrine: No thryomegaly Cardiac: Normal S1, S2; RRR; no murmurs, rubs, or gallops Lungs: Clear to auscultation bilaterally, no wheezing, rhonchi or rales  Abd: Soft, nontender, no hepatomegaly  Ext: No edema, pulses 2+ Musculoskeletal: No deformities, BUE and BLE strength normal and equal Skin: Warm and dry, no rashes   Neuro: Alert and oriented to person, place, time, and situation,  CNII-XII grossly intact, no focal deficits  Psych: Normal mood and affect   ASSESSMENT:   SOPHANA MONTEROSSO is a 82 y.o. male who presents for the following: 1. RBBB   2. Dizziness   3. First degree AV block   4. Aortic dilatation (HCC)   5. Coronary artery disease involving native coronary artery of native heart without angina pectoris   6. Mixed hyperlipidemia   7. Primary hypertension     PLAN:   1. RBBB 2. Dizziness 3. First degree AV block -Reports dizziness.  Could be related to low blood pressure.  We are reducing his lisinopril.  Also EKG does show progression of first-degree AV block.  330 ms.  He has a right bundle branch block and left anterior fascicular block.  I would like to proceed with a 7-day ZIO to exclude any high-grade AV block.  May need to be considered for pacemaker.  Echocardiogram last year was normal.  Stress testing has been normal.  4. Aortic dilatation (HCC) -45 mm on echo last year.  We will plan for a noncontrast chest CT to get better evaluation of this.  Has been roughly 40 mm in the past.  5. Coronary artery disease involving native coronary artery of native heart without angina pectoris 6. Mixed hyperlipidemia -Nonobstructive CAD in the past.  Continues to report symptoms of likely noncardiac chest discomfort.  On Lipitor 40.  We will forego testing at this time.  Nothing is really changed other than dizziness and EKG.  7. Primary hypertension -Blood pressure a bit low.  Reduce lisinopril to 5 mg daily.  Hopefully this may help his dizziness.  He will see me back in 3 to 4 months to discuss further.      Disposition: Return in about 3 months (around 06/23/2023).  Medication Adjustments/Labs and  Tests Ordered: Current medicines are reviewed at length with the patient today.  Concerns regarding medicines are outlined above.  Orders Placed This Encounter  Procedures   CT Chest Wo Contrast   TSH   LONG TERM MONITOR (3-14 DAYS)   Meds ordered this  encounter  Medications   lisinopril (ZESTRIL) 5 MG tablet    Sig: Take 1 tablet (5 mg total) by mouth daily.    Dispense:  90 tablet    Refill:  3    Patient Instructions  Medication Instructions:  STOP Lisinopril 20 mg daily  START Lisinopril 5 mg daily   *If you need a refill on your cardiac medications before your next appointment, please call your pharmacy*   Lab Work: TSH today   If you have labs (blood work) drawn today and your tests are completely normal, you will receive your results only by: MyChart Message (if you have MyChart) OR A paper copy in the mail If you have any lab test that is abnormal or we need to change your treatment, we will call you to review the results.   Testing/Procedures: NON CONTRAST CHEST CT   ZIO XT- Long Term Monitor Instructions  Your physician has requested you wear a ZIO patch monitor for 7 days.  This is a single patch monitor. [employer] supplies one patch monitor per enrollment. Additional stickers are not available. Please do not apply patch if you will be having a Nuclear Stress Test,  Echocardiogram, Cardiac CT, MRI, or Chest Xray during the period you would be wearing the  monitor. The patch cannot be worn during these tests. You cannot remove and re-apply the  ZIO XT patch monitor.  Your ZIO patch monitor will be mailed 3 day USPS to your address on file. It may take 3-5 days  to receive your monitor after you have been enrolled.  Once you have received your monitor, please review the enclosed instructions. Your monitor  has already been registered assigning a specific monitor serial # to you.  Billing and Patient Assistance Program Information  We have supplied [employer] with any of your insurance information on file for billing purposes. [employer] offers a sliding scale Patient Assistance Program for patients that do not have  insurance, or whose insurance does not completely cover the cost of the ZIO monitor.  You must apply  for the Patient Assistance Program to qualify for this discounted rate.  To apply, please call [employer] at [phone], select option 4, select option 2, ask to apply for  Patient Assistance Program. Meredeth Ide will ask your household income, and how many people  are in your household. They will quote your out-of-pocket cost based on that information.  [employer] will also be able to set up a 60-month, interest-free payment plan if needed.  Applying the monitor   Shave hair from upper left chest.  Hold abrader disc by orange tab. Rub abrader in 40 strokes over the upper left chest as  indicated in your monitor instructions.  Clean area with 4 enclosed alcohol pads. Let dry.  Apply patch as indicated in monitor instructions. Patch will be placed under collarbone on left  side of chest with arrow pointing upward.  Rub patch adhesive wings for 2 minutes. Remove white label marked "1". Remove the white  label marked "2". Rub patch adhesive wings for 2 additional minutes.  While looking in a mirror, press and release button in center of patch. A small green light will  flash 3-4 times. This will  be your only indicator that the monitor has been turned on.  Do not shower for the first 24 hours. You may shower after the first 24 hours.  Press the button if you feel a symptom. You will hear a small click. Record Date, Time and  Symptom in the Patient Logbook.  When you are ready to remove the patch, follow instructions on the last 2 pages of Patient  Logbook. Stick patch monitor onto the last page of Patient Logbook.  Place Patient Logbook in the blue and white box. Use locking tab on box and tape box closed  securely. The blue and white box has prepaid postage on it. Please place it in the mailbox as  soon as possible. Your physician should have your test results approximately 7 days after the  monitor has been mailed back to [facility].  Call [facility] Customer Care at [phone] if you  have questions regarding  your ZIO XT patch monitor. Call them immediately if you see an orange light blinking on your  monitor.  If your monitor falls off in less than 4 days, contact our Monitor department at [phone].  If your monitor becomes loose or falls off after 4 days call [employer] at [phone] for  suggestions on securing your monitor    Follow-Up: At [facility], you and your health needs are our priority.  As part of our continuing mission to provide you with exceptional heart care, we have created designated Provider Care Teams.  These Care Teams include your primary Cardiologist (physician) and Advanced Practice Providers (APPs -  Physician Assistants and Nurse Practitioners) who all work together to provide you with the care you need, when you need it.  We recommend signing up for the patient portal called "MyChart".  Sign up information is provided on this After Visit Summary.  MyChart is used to connect with patients for Virtual Visits (Telemedicine).  Patients are able to view lab/test results, encounter notes, upcoming appointments, etc.  Non-urgent messages can be sent to your provider as well.   To learn more about what you can do with MyChart, go to [email or web address].    Your next appointment:   3 month(s)  Provider:   Reatha Harps, Sanders        Time Spent with Patient: I have spent a total of 35 minutes with patient reviewing hospital notes, telemetry, EKGs, labs and examining the patient as well as establishing an assessment and plan that was discussed with the patient.  > 50% of time was spent in direct patient care.  Signed, Lenna Gilford. Flora Lipps, Sanders, [facility]  [facility]  [address] [place], [place] [address] [phone]  03/23/2023 10:53 AM

## 2023-03-23 ENCOUNTER — Encounter: Payer: Self-pay | Admitting: Cardiovascular Disease

## 2023-03-23 ENCOUNTER — Ambulatory Visit: Payer: 59 | Attending: Cardiovascular Disease | Admitting: Cardiovascular Disease

## 2023-03-23 ENCOUNTER — Telehealth: Payer: Self-pay | Admitting: Physician Assistant

## 2023-03-23 ENCOUNTER — Ambulatory Visit: Payer: 59

## 2023-03-23 VITALS — BP 110/54 | HR 54 | Ht 65.0 in | Wt 178.2 lb

## 2023-03-23 DIAGNOSIS — I77819 Aortic ectasia, unspecified site: Secondary | ICD-10-CM | POA: Diagnosis not present

## 2023-03-23 DIAGNOSIS — I440 Atrioventricular block, first degree: Secondary | ICD-10-CM | POA: Diagnosis not present

## 2023-03-23 DIAGNOSIS — I2510 Atherosclerotic heart disease of native coronary artery without angina pectoris: Secondary | ICD-10-CM | POA: Diagnosis not present

## 2023-03-23 DIAGNOSIS — I10 Essential (primary) hypertension: Secondary | ICD-10-CM

## 2023-03-23 DIAGNOSIS — R42 Dizziness and giddiness: Secondary | ICD-10-CM

## 2023-03-23 DIAGNOSIS — I4510 Unspecified right bundle-branch block: Secondary | ICD-10-CM

## 2023-03-23 DIAGNOSIS — E782 Mixed hyperlipidemia: Secondary | ICD-10-CM

## 2023-03-23 MED ORDER — LISINOPRIL 5 MG PO TABS: 5.0000 mg | ORAL_TABLET | Freq: Every day | ORAL | 3 refills | Status: DC

## 2023-03-23 NOTE — Addendum Note (Signed)
Addended by: Berlinda Last on: 03/23/2023 04:06 PM   Modules accepted: Orders

## 2023-03-23 NOTE — Telephone Encounter (Signed)
PT is calling to find out if he should continue taking dexlansoprazole. Please advise.

## 2023-03-23 NOTE — Telephone Encounter (Signed)
Returned call to patient. Pt states that he read the insert for Dexlansoprazole and it says to notify your provider if you have kidney issues because this medication may weaken the kidneys. I told pt that I would check with you to see what you recommended. Pt did have a BMET drawn on 03/02/23. Please advise, thanks.

## 2023-03-23 NOTE — Progress Notes (Unsigned)
Enrolled patient for a 7 day Zio XT monitor to be mailed to patients home.  

## 2023-03-23 NOTE — Patient Instructions (Signed)
Medication Instructions:  STOP Lisinopril 20 mg daily  START Lisinopril 5 mg daily   *If you need a refill on your cardiac medications before your next appointment, please call your pharmacy*   Lab Work: TSH today   If you have labs (blood work) drawn today and your tests are completely normal, you will receive your results only by: MyChart Message (if you have MyChart) OR A paper copy in the mail If you have any lab test that is abnormal or we need to change your treatment, we will call you to review the results.   Testing/Procedures: NON CONTRAST CHEST CT   ZIO XT- Long Term Monitor Instructions  Your physician has requested you wear a ZIO patch monitor for 7 days.  This is a single patch monitor. [employer] supplies one patch monitor per enrollment. Additional stickers are not available. Please do not apply patch if you will be having a Nuclear Stress Test,  Echocardiogram, Cardiac CT, MRI, or Chest Xray during the period you would be wearing the  monitor. The patch cannot be worn during these tests. You cannot remove and re-apply the  ZIO XT patch monitor.  Your ZIO patch monitor will be mailed 3 day USPS to your address on file. It may take 3-5 days  to receive your monitor after you have been enrolled.  Once you have received your monitor, please review the enclosed instructions. Your monitor  has already been registered assigning a specific monitor serial # to you.  Billing and Patient Assistance Program Information  We have supplied [employer] with any of your insurance information on file for billing purposes. [employer] offers a sliding scale Patient Assistance Program for patients that do not have  insurance, or whose insurance does not completely cover the cost of the ZIO monitor.  You must apply for the Patient Assistance Program to qualify for this discounted rate.  To apply, please call [employer] at [phone], select option 4, select option 2, ask to apply for  Patient  Assistance Program. Meredeth Ide will ask your household income, and how many people  are in your household. They will quote your out-of-pocket cost based on that information.  [employer] will also be able to set up a 37-month, interest-free payment plan if needed.  Applying the monitor   Shave hair from upper left chest.  Hold abrader disc by orange tab. Rub abrader in 40 strokes over the upper left chest as  indicated in your monitor instructions.  Clean area with 4 enclosed alcohol pads. Let dry.  Apply patch as indicated in monitor instructions. Patch will be placed under collarbone on left  side of chest with arrow pointing upward.  Rub patch adhesive wings for 2 minutes. Remove white label marked "1". Remove the white  label marked "2". Rub patch adhesive wings for 2 additional minutes.  While looking in a mirror, press and release button in center of patch. A small green light will  flash 3-4 times. This will be your only indicator that the monitor has been turned on.  Do not shower for the first 24 hours. You may shower after the first 24 hours.  Press the button if you feel a symptom. You will hear a small click. Record Date, Time and  Symptom in the Patient Logbook.  When you are ready to remove the patch, follow instructions on the last 2 pages of Patient  Logbook. Stick patch monitor onto the last page of Patient Logbook.  Place Patient Logbook in the blue  and white box. Use locking tab on box and tape box closed  securely. The blue and white box has prepaid postage on it. Please place it in the mailbox as  soon as possible. Your physician should have your test results approximately 7 days after the  monitor has been mailed back to [facility].  Call [facility] Customer Care at [phone] if you have questions regarding  your ZIO XT patch monitor. Call them immediately if you see an orange light blinking on your  monitor.  If your monitor falls off in less than 4 days,  contact our Monitor department at [phone].  If your monitor becomes loose or falls off after 4 days call [employer] at [phone] for  suggestions on securing your monitor    Follow-Up: At [facility], you and your health needs are our priority.  As part of our continuing mission to provide you with exceptional heart care, we have created designated Provider Care Teams.  These Care Teams include your primary Cardiologist (physician) and Advanced Practice Providers (APPs -  Physician Assistants and Nurse Practitioners) who all work together to provide you with the care you need, when you need it.  We recommend signing up for the patient portal called "MyChart".  Sign up information is provided on this After Visit Summary.  MyChart is used to connect with patients for Virtual Visits (Telemedicine).  Patients are able to view lab/test results, encounter notes, upcoming appointments, etc.  Non-urgent messages can be sent to your provider as well.   To learn more about what you can do with MyChart, go to [email or web address].    Your next appointment:   3 month(s)  Provider:   Reatha Harps, MD

## 2023-03-23 NOTE — Telephone Encounter (Signed)
Returned call to pt. We reviewed recommendations. Patient states that he will be busy in 4 weeks. I told pt that we were just ordering labs to recheck his kidney function, if he is no longer worried about that then he can just continue Dexilant as prescribed. Pt verbalized understanding and had no concerns.

## 2023-03-24 LAB — TSH: TSH: 2.9 u[IU]/mL (ref 0.450–4.500)

## 2023-03-28 ENCOUNTER — Telehealth: Payer: Self-pay | Admitting: Cardiovascular Disease

## 2023-03-28 NOTE — Telephone Encounter (Signed)
Patient states that he has not gotten his heart monitor yet. States that he does not have time to wear it between 06/09 and 08/18. Any time after 08/18 should be fine. He states that he spoke with Zio already - just FYI.

## 2023-06-22 ENCOUNTER — Ambulatory Visit: Payer: 59 | Admitting: Cardiovascular Disease

## 2023-07-10 NOTE — Progress Notes (Deleted)
Cardiology Office Note:   Date:  07/10/2023  NAME:  Omar Sanders    MRN: [id number] DOB:  [birth year]   PCP:  Erick Alley, DO  Cardiologist:  Reatha Harps, MD  Electrophysiologist:  None   Referring MD: Erick Alley, DO   No chief complaint on file.   History of Present Illness:   Omar Sanders is a 82 y.o. male with a hx of non-obstructive CAD, DM, HTN, HLD who presents for follow-up.   Problem List 1. 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 HTN 4. Ascending aortic aneurysm  -45 mm 11/2021 5. HLD -T chol 108, HDL 26, LDL 40, TG 275 6. CAD -non-obstructive CAD 2010 -LHC 2010: 50% mLAD; 40% mRCA -normal MPI 09/16/2020 7. DM -A1c 6.8 8. CKD -eGFR 43  Past Medical History: Past Medical History:  Diagnosis Date   Abdominal bloating 07/20/2020   Allergy    Anal fissure    Anemia    Anxiety    Arthritis    all over body    Benign prostatic hyperplasia    Bilateral lower extremity edema 03/22/2018   Cataract    removed bilat    Chest pain 02/2018   Chronic kidney disease    Chronic kidney disease, stage 3a (HCC) 01/19/2016   Chronic pain    Colon polyp    Diabetes mellitus without complication (HCC)    Falls 05/19/2021   GERD (gastroesophageal reflux disease)    Headache(784.0)    Hemorrhoids    Hepatic steatosis 05/19/2021   Hyperlipidemia    Hx: of   Hypertension    IBS (irritable bowel syndrome)    Incontinent of urine    Hx:of   Migraines    Neuromuscular disorder (HCC)    periferal neuropathy per patient   New onset right bundle branch block (RBBB)    OSA (obstructive sleep apnea) 11/2009   sleep study with mild OSA, pt unwilling to use CPAP   Seizures (HCC)    pt states he has never had a seizure    Sleep apnea    mild    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    Past Surgical History: Past Surgical History:  Procedure Laterality Date   CARDIAC CATHETERIZATION  06/04/2009   mild to moderate CAD  w/o hemodynamic  significant lesion   CATARACT EXTRACTION     bilat   CHOLECYSTECTOMY  2008   COLONOSCOPY  2012   Dr Loreta Ave   HEMORRHOID SURGERY     anal surgery   INGUINAL HERNIA REPAIR Bilateral 03/11/2013   Procedure: LAPAROSCOPIC BILATERAL INGUINAL HERNIA REPAIR;  Surgeon: Axel Filler, MD;  Location: [facility] OR;  Service: General;  Laterality: Bilateral;   INSERTION OF MESH Bilateral 03/11/2013   Procedure: INSERTION OF MESH;  Surgeon: Axel Filler, MD;  Location: [facility] OR;  Service: General;  Laterality: Bilateral;   NM MYOCAR PERF WALL MOTION  05/27/2009   mild iscemia mid inferio & apical regions.   NM MYOCAR PERF WALL MOTION  11/01/2012   negative for ischemia   POLYPECTOMY     TRANSURETHRAL RESECTION OF PROSTATE N/A 02/18/2021   Procedure: TRANSURETHRAL RESECTION OF THE PROSTATE (TURP), BIPOLAR;  Surgeon: Rene Paci, MD;  Location: [facility];  Service: Urology;  Laterality: N/A;   US ECHOCARDIOGRAPHY  05/27/2009   mild mitral annular ca+,AOV mildly sclerotic    Current Medications: No outpatient medications have been marked as taking for the 07/12/23 encounter (Appointment) with O'Neal,  Ronnald Ramp, MD.     Allergies:    Pantoprazole, Aspirin, Lubiprostone, and Vitamin c   Social History: Social History   Socioeconomic History   Marital status: Widowed    Spouse name: Not on file   Number of children: 5   Years of education: 16   Highest education level: Bachelor's degree (e.g., BA, AB, BS)  Occupational History   Not on file  Tobacco Use   Smoking status: Never   Smokeless tobacco: Never  Vaping Use   Vaping status: Never Used  Substance and Sexual Activity   Alcohol use: No   Drug use: No   Sexual activity: Not Currently  Other Topics Concern   Not on file  Social History Narrative   Omar Sanders lives with his son and daughter with the daughter's two children ages.  Two of his daughter's children are off in college (04/2021)   Omar Sanders has two other children in  [place].      Omar Sanders is by himself much of the day as his daughter works long day shifts.    Omar Sanders cooks, does laundry for family, and housework.    Social Determinants of Health   Financial Resource Strain: Not on file  Food Insecurity: Not on file  Transportation Needs: Unmet Transportation Needs (04/23/2019)   PRAPARE - Administrator, Civil Service (Medical): Yes    Lack of Transportation (Non-Medical): Yes  Physical Activity: Not on file  Stress: Not on file  Social Connections: Not on file     Family History: The patient's family history includes Hypertension in his mother. There is no history of Colon cancer, Colon polyps, Esophageal cancer, Rectal cancer, or Stomach cancer.  ROS:   All other ROS reviewed and negative. Pertinent positives noted in the HPI.     EKGs/Labs/Other Studies Reviewed:   The following studies were personally reviewed by me today:  EKG:  EKG is *** ordered today.        TTE 12/22/2021  1. Left ventricular ejection fraction, by estimation, is 60 to 65%. The  left ventricle has normal function. The left ventricle has no regional  wall motion abnormalities. Left ventricular diastolic parameters are  consistent with Grade I diastolic  dysfunction (impaired relaxation).   2. Right ventricular systolic function is normal. The right ventricular  size is normal. There is normal pulmonary artery systolic pressure.   3. The mitral valve is normal in structure. Mild mitral valve  regurgitation. No evidence of mitral stenosis.   4. Tricuspid valve regurgitation is mild to moderate.   5. The aortic valve is normal in structure. Aortic valve regurgitation is  mild. No aortic stenosis is present.   6. Aneurysm of the ascending aorta, measuring 45 mm.   7. The inferior vena cava is normal in size with greater than 50%  respiratory variability, suggesting right atrial pressure of 3 mmHg.   Recent Labs: 03/02/2023: BUN 17; Creatinine, Ser  1.57; Potassium 4.3; Sodium 134 03/23/2023: TSH 2.900   Recent Lipid Panel    Component Value Date/Time   CHOL 108 03/02/2023 1220   TRIG 275 (H) 03/02/2023 1220   HDL 26 (L) 03/02/2023 1220   CHOLHDL 4.2 03/02/2023 1220   CHOLHDL 7.0 (H) 07/12/2015 0938   VLDL 57 (H) 07/12/2015 0938   LDLCALC 40 03/02/2023 1220    Physical Exam:   VS:  There were no vitals taken for this visit.   Wt Readings from Last 3 Encounters:  03/23/23 178 lb 3.2 oz (80.8 kg)  03/02/23 179 lb 4 oz (81.3 kg)  01/15/23 179 lb (81.2 kg)    General: Well nourished, well developed, in no acute distress Head: Atraumatic, normal size  Eyes: PEERLA, EOMI  Neck: Supple, no JVD Endocrine: No thryomegaly Cardiac: Normal S1, S2; RRR; no murmurs, rubs, or gallops Lungs: Clear to auscultation bilaterally, no wheezing, rhonchi or rales  Abd: Soft, nontender, no hepatomegaly  Ext: No edema, pulses 2+ Musculoskeletal: No deformities, BUE and BLE strength normal and equal Skin: Warm and dry, no rashes   Neuro: Alert and oriented to person, place, time, and situation, CNII-XII grossly intact, no focal deficits  Psych: Normal mood and affect   ASSESSMENT:   Omar Sanders is a 82 y.o. male who presents for the following: No diagnosis found.  PLAN:   There are no diagnoses linked to this encounter.  {Are you ordering a CV Procedure (e.g. stress test, cath, DCCV, TEE, etc)?   Press F2        :[id number]}  Disposition: No follow-ups on file.  Medication Adjustments/Labs and Tests Ordered: Current medicines are reviewed at length with the patient today.  Concerns regarding medicines are outlined above.  No orders of the defined types were placed in this encounter.  No orders of the defined types were placed in this encounter.  There are no Patient Instructions on file for this visit.   Time Spent with Patient: I have spent a total of *** minutes with patient reviewing hospital notes, telemetry, EKGs, labs and  examining the patient as well as establishing an assessment and plan that was discussed with the patient.  > 50% of time was spent in direct patient care.  Signed, Lenna Gilford. Flora Lipps, MD, [facility]  [facility]  [address], [address] [place], [place] [address] [phone]  07/10/2023 1:38 PM

## 2023-07-12 ENCOUNTER — Ambulatory Visit: Payer: 59 | Admitting: Cardiovascular Disease

## 2023-07-12 DIAGNOSIS — I440 Atrioventricular block, first degree: Secondary | ICD-10-CM

## 2023-07-12 DIAGNOSIS — I7121 Aneurysm of the ascending aorta, without rupture: Secondary | ICD-10-CM

## 2023-07-12 DIAGNOSIS — I4510 Unspecified right bundle-branch block: Secondary | ICD-10-CM

## 2023-07-12 DIAGNOSIS — I2510 Atherosclerotic heart disease of native coronary artery without angina pectoris: Secondary | ICD-10-CM

## 2023-07-12 DIAGNOSIS — E782 Mixed hyperlipidemia: Secondary | ICD-10-CM

## 2023-07-25 ENCOUNTER — Encounter: Payer: Self-pay | Admitting: Student

## 2023-07-25 ENCOUNTER — Ambulatory Visit: Payer: 59 | Admitting: Student

## 2023-07-25 VITALS — BP 139/66 | HR 51 | Ht 65.0 in | Wt 177.2 lb

## 2023-07-25 DIAGNOSIS — Z7984 Long term (current) use of oral hypoglycemic drugs: Secondary | ICD-10-CM

## 2023-07-25 DIAGNOSIS — I152 Hypertension secondary to endocrine disorders: Secondary | ICD-10-CM

## 2023-07-25 DIAGNOSIS — Z23 Encounter for immunization: Secondary | ICD-10-CM

## 2023-07-25 DIAGNOSIS — E1159 Type 2 diabetes mellitus with other circulatory complications: Secondary | ICD-10-CM

## 2023-07-25 DIAGNOSIS — R42 Dizziness and giddiness: Secondary | ICD-10-CM

## 2023-07-25 DIAGNOSIS — R001 Bradycardia, unspecified: Secondary | ICD-10-CM | POA: Diagnosis not present

## 2023-07-25 NOTE — Assessment & Plan Note (Signed)
Patient advised not to double lisinopril and to take only one 5 mg tablet a day.  I would rather him ride high than low considering his age and symptoms of dizziness.

## 2023-07-25 NOTE — Assessment & Plan Note (Signed)
Already following with cardiology.  Unable to wear ZIO monitor recently due to financial reasons.  Has follow-up appointment in October.  Reassuringly, does not appear to be symptomatic today.

## 2023-07-25 NOTE — Assessment & Plan Note (Signed)
Very chronic, no changes.  Reassuringly, no falls and gait appears stable.  Previously referred to neurology by ENT.  Patient provided with their contact information today and advised to schedule appointment as soon as possible.  Can consider vestibular rehab in the future if no improvement/explanation of symptoms after seeing neurology.  Can consider hearing loss to be a contributing factor.  Patient advised to contact his insurance company about hearing aids.  I will look into resources and consider contacting ENT provider about next steps.

## 2023-07-25 NOTE — Patient Instructions (Signed)
It was great to see you! Thank you for allowing me to participate in your care!  I recommend that you always bring your medications to each appointment as this makes it easy to ensure you are on the correct medications and helps [place] not miss when refills are needed.  Our plans for today:  - I recommend taking only 1 tablet of lisinopril 5 mg daily - Go to next cardiology appointment in October -Return in 2 months to check A1c - Call: [facility] to schedule appointment with neurologist  Address: [address], [place], [place] [address] Phone: [phone]   Take care and seek immediate care sooner if you develop any concerns.   Dr. Erick Alley, DO [facility]

## 2023-07-25 NOTE — Progress Notes (Signed)
    SUBJECTIVE:   CHIEF COMPLAINT / HPI:   HTN Saw cardiology 03/19/2023, lisinopril decreased to 5 mg due to symptoms of dizziness (very chronic) and BP on lower end at 110/54. Checks BP at home - SBP has been as low as 110 and high as 150. When it is is 140-15, he takes two tablets lisinopril. DBP has been 60's-low 100's.   Dizziness Chronic issue, has been ongoing for at least 10 years, accompanied by headaches. No falls. Has had positive and negative orthostatic vitals in the past.  Has been referred to audiology and ophthalmology for evaluation. Saw audiology and ENT 02/23/2023 and diagnosed with bilateral significant sensorineural hearing loss with recommendations to repeat hearing test annually, recommended hearing aids (pt unable to afford them but hasn't checked with insurance), and they referred him to neurology for further evaluation for dizziness.  Today, he is unaware of this referral.  [facility] ordered ZIO monitor in May due to EKG showing progression of first-degree AV block. He was unable to get the monitor d/t financial reasons. Has another apt with them in Oct.  Denies any worsening dizziness or shortness of breath.  States he feels like his normal self today.   PERTINENT  PMH / PSH: HTN, HLD, diet-controlled T2DM, CKD, GAD, dizziness  OBJECTIVE:   BP 139/66   Pulse (!) 51   Ht 5\' 5"  (1.651 m)   Wt 177 lb 4 oz (80.4 kg)   SpO2 100%   BMI 29.50 kg/m    General: NAD, pleasant, able to participate in exam Cardiac: Bradycardic Respiratory: CTAB, normal effort, No wheezes, rales or rhonchi Skin: warm and dry Neuro: alert, no obvious focal deficits, stable gait Psych: Normal affect and mood  ASSESSMENT/PLAN:   Hypertension associated with diabetes (HCC) Patient advised not to double lisinopril and to take only one 5 mg tablet a day.  I would rather him ride high than low considering his age and symptoms of dizziness.  Dizziness Very chronic, no changes.   Reassuringly, no falls and gait appears stable.  Previously referred to neurology by ENT.  Patient provided with their contact information today and advised to schedule appointment as soon as possible.  Can consider vestibular rehab in the future if no improvement/explanation of symptoms after seeing neurology.  Can consider hearing loss to be a contributing factor.  Patient advised to contact his insurance company about hearing aids.  I will look into resources and consider contacting ENT provider about next steps.  Bradycardia Already following with cardiology.  Unable to wear ZIO monitor recently due to financial reasons.  Has follow-up appointment in October.  Reassuringly, does not appear to be symptomatic today.   Patient plans to return in about 2 months to check A1c, UACR, DM foot exam, will discuss setting up Medicare AWV then if appropriate  Dr. Erick Alley, DO [facility]

## 2023-08-06 DIAGNOSIS — N1832 Chronic kidney disease, stage 3b: Secondary | ICD-10-CM | POA: Diagnosis not present

## 2023-08-13 DIAGNOSIS — E785 Hyperlipidemia, unspecified: Secondary | ICD-10-CM | POA: Diagnosis not present

## 2023-08-13 DIAGNOSIS — E1122 Type 2 diabetes mellitus with diabetic chronic kidney disease: Secondary | ICD-10-CM | POA: Diagnosis not present

## 2023-08-13 DIAGNOSIS — N1831 Chronic kidney disease, stage 3a: Secondary | ICD-10-CM | POA: Diagnosis not present

## 2023-08-13 DIAGNOSIS — E559 Vitamin D deficiency, unspecified: Secondary | ICD-10-CM | POA: Diagnosis not present

## 2023-08-13 DIAGNOSIS — D631 Anemia in chronic kidney disease: Secondary | ICD-10-CM | POA: Diagnosis not present

## 2023-08-13 DIAGNOSIS — I129 Hypertensive chronic kidney disease with stage 1 through stage 4 chronic kidney disease, or unspecified chronic kidney disease: Secondary | ICD-10-CM | POA: Diagnosis not present

## 2023-08-14 NOTE — Progress Notes (Deleted)
Cardiology Office Note:  .   Date:  08/14/2023  ID:  Omar Sanders, DOB 06-22-1941, MRN [id number] PCP: Erick Alley, DO  [facility] Providers Cardiologist:  Reatha Harps, MD { Click to update primary MD,subspecialty MD or APP then REFRESH:1}   History of Present Illness: .   Omar Sanders is a 82 y.o. male with below history who presents for follow-up.      Problem List 1. 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 HTN 4. 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 HLD -T chol 108, HDL 26, LDL 40, TG 275 6. CAD -non-obstructive CAD 2010 -LHC 2010: 50% mLAD; 40% mRCA -normal MPI 09/16/2020 7. DM -A1c 6.8 8. CKD -eGFR 43    ROS: All other ROS reviewed and negative. Pertinent positives noted in the HPI.     Studies Reviewed: Marland Kitchen       TTE 12/22/2022  1. Left ventricular ejection fraction, by estimation, is 60 to 65%. The  left ventricle has normal function. The left ventricle has no regional  wall motion abnormalities. Left ventricular diastolic parameters are  consistent with Grade I diastolic  dysfunction (impaired relaxation).   2. Right ventricular systolic function is normal. The right ventricular  size is normal. There is normal pulmonary artery systolic pressure.   3. The mitral valve is normal in structure. Mild mitral valve  regurgitation. No evidence of mitral stenosis.   4. Tricuspid valve regurgitation is mild to moderate.   5. The aortic valve is normal in structure. Aortic valve regurgitation is  mild. No aortic stenosis is present.   6. Aneurysm of the ascending aorta, measuring 45 mm.   7. The inferior vena cava is normal in size with greater than 50%  respiratory variability, suggesting right atrial pressure of 3 mmHg.  Physical Exam:   VS:  There were no vitals taken for this visit.   Wt Readings from Last 3 Encounters:  07/25/23 177 lb 4 oz (80.4 kg)  03/23/23 178 lb 3.2 oz (80.8 kg)  03/02/23 179 lb 4 oz (81.3 kg)    GEN: Well  nourished, well developed in no acute distress NECK: No JVD; No carotid bruits CARDIAC: ***RRR, no murmurs, rubs, gallops RESPIRATORY:  Clear to auscultation without rales, wheezing or rhonchi  ABDOMEN: Soft, non-tender, non-distended EXTREMITIES:  No edema; No deformity  ASSESSMENT AND PLAN: .   ***    {Are you ordering a CV Procedure (e.g. stress test, cath, DCCV, TEE, etc)?   Press F2        :[id number]}   Follow-up: No follow-ups on file.  Time Spent with Patient: I have spent a total of *** minutes with patient reviewing hospital notes, telemetry, EKGs, labs and examining the patient as well as establishing an assessment and plan that was discussed with the patient.  > 50% of time was spent in direct patient care.  Signed, Lenna Gilford. Flora Lipps, MD, [facility]  [facility]  [address] [place], [place] [address] [phone]  5:49 AM

## 2023-08-15 ENCOUNTER — Ambulatory Visit: Payer: 59 | Admitting: Cardiovascular Disease

## 2023-08-20 DIAGNOSIS — E119 Type 2 diabetes mellitus without complications: Secondary | ICD-10-CM | POA: Diagnosis not present

## 2023-08-29 ENCOUNTER — Ambulatory Visit: Payer: 59 | Admitting: Cardiovascular Disease

## 2023-10-04 ENCOUNTER — Telehealth: Payer: Self-pay

## 2023-10-04 NOTE — Telephone Encounter (Signed)
Patient calls nurse line requesting that referral for ENT be changed to [facility].   Referral was placed back on 11/10/22.  Omar Sanders- Will patient need a new referral placed by PCP?   Veronda Prude, RN

## 2023-10-07 NOTE — Progress Notes (Unsigned)
Cardiology Office Note:  .   Date:  10/08/2023  ID:  Omar Sanders, DOB 1941/06/01, MRN [id number] PCP: Erick Alley, DO  [facility] Providers Cardiologist:  Reatha Harps, MD { History of Present Illness: .   Omar Sanders is a 82 y.o. male with below history who presents for follow-up. Ordered monitor and CT chest but not completed.    History of Present Illness   [name], an 82 year old male with a history of right bundle branch block, first degree block, non-effective CED, ascending aortic aneurysm, diabetes, and CKD stage 3B, presents for a follow-up visit. He expresses a general sense of worry and anxiety about his health, stating that he feels "worried about almost everything." He reports experiencing chest discomfort, describing it as a general, achy pain that is not sharp. The discomfort is located on his left side and occurs multiple times a day. He also reports experiencing back pain at least once a week. Additionally, he has been experiencing dizziness for several years, particularly when moving his head in certain ways. He maintains an active lifestyle, moving around a lot and doing housework. He also reports concerns about potential liver damage from taking too much Tylenol for pain management. He reports no SOB. Dizziness is stable.          Problem List 1. 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 HTN 4. Dilated Aorta -4.0 cm likely normal for age seen on CT 2019 -4.5 cm on echo 12/22/2021 5. HLD -T chol 108, HDL 26, LDL 40, TG 275 6. CAD -non-obstructive CAD 2010 -LHC 2010: 50% mLAD; 40% mRCA -normal MPI 09/16/2020 7. DM -A1c 6.8 8. CKD -eGFR 43    ROS: All other ROS reviewed and negative. Pertinent positives noted in the HPI.     Studies Reviewed: Marland Kitchen       Physical Exam:   VS:  BP 130/60 (BP Location: Left Arm, Patient Position: Sitting, Cuff Size: Normal)   Pulse 77   Ht 5\' 5"  (1.651 m)   Wt 173 lb 12.8 oz (78.8 kg)   SpO2 96%   BMI 28.92 kg/m    Wt Readings  from Last 3 Encounters:  10/08/23 173 lb 12.8 oz (78.8 kg)  07/25/23 177 lb 4 oz (80.4 kg)  03/23/23 178 lb 3.2 oz (80.8 kg)    GEN: Well nourished, well developed in no acute distress NECK: No JVD; No carotid bruits CARDIAC: RRR, no murmurs, rubs, gallops RESPIRATORY:  Clear to auscultation without rales, wheezing or rhonchi  ABDOMEN: Soft, non-tender, non-distended EXTREMITIES:  No edema; No deformity  ASSESSMENT AND PLAN: .   Assessment and Plan    Aortic Aneurysm History of ascending aortic dilation up to 45mm on echo in February 2023. CT scan was not completed previously. -Schedule a CT scan without contrast to assess the aorta.  Noncardiac Chest Discomfort Described as achy, non-exertional, and occurring periodically. Stress test in 2021 was normal. -No further workup required for chest discomfort.  Hyperlipidemia On Lipitor 40mg , lipids are at goal. Triglycerides have been elevated. -Encourage dietary modifications to manage triglycerides.  Diabetes Mellitus A1c 6.8, well controlled. -Continue current management.  Hypertension BP 130/60, well controlled. -Continue current management.   RBBB 1AVB Dizziness -stable. No worsening symptoms. HR 77 today. Will follow conservatively.   Follow-up in 1 year. Continue to monitor aorta.              Follow-up: Return in about 1 year (around 10/07/2024).  Time Spent with  Patient: I have spent a total of 35 minutes caring for this patient today face to face, ordering and reviewing labs/tests, reviewing prior records/medical history, examining the patient, establishing an assessment and plan, communicating results/findings to the patient/family, and documenting in the medical record.   Signed, Lenna Gilford. Flora Lipps, MD, [address] [facility]  [facility]  [address], [address] [place], [place] [address] [phone]  11:55 AM

## 2023-10-08 ENCOUNTER — Ambulatory Visit: Payer: 59 | Attending: Cardiovascular Disease | Admitting: Cardiovascular Disease

## 2023-10-08 ENCOUNTER — Encounter: Payer: Self-pay | Admitting: Cardiovascular Disease

## 2023-10-08 ENCOUNTER — Telehealth: Payer: Self-pay

## 2023-10-08 VITALS — BP 130/60 | HR 77 | Ht 65.0 in | Wt 173.8 lb

## 2023-10-08 DIAGNOSIS — I440 Atrioventricular block, first degree: Secondary | ICD-10-CM | POA: Diagnosis not present

## 2023-10-08 DIAGNOSIS — I10 Essential (primary) hypertension: Secondary | ICD-10-CM

## 2023-10-08 DIAGNOSIS — R42 Dizziness and giddiness: Secondary | ICD-10-CM

## 2023-10-08 DIAGNOSIS — E782 Mixed hyperlipidemia: Secondary | ICD-10-CM

## 2023-10-08 DIAGNOSIS — I2510 Atherosclerotic heart disease of native coronary artery without angina pectoris: Secondary | ICD-10-CM | POA: Diagnosis not present

## 2023-10-08 DIAGNOSIS — I77819 Aortic ectasia, unspecified site: Secondary | ICD-10-CM | POA: Diagnosis not present

## 2023-10-08 DIAGNOSIS — I4510 Unspecified right bundle-branch block: Secondary | ICD-10-CM

## 2023-10-08 NOTE — Telephone Encounter (Signed)
Used interpretation services to call patient. No answer, left vm to call back.  Tried calling patients daughter Cil and Son Griffin Basil but did not get an answer. LVMTCB  Patient scheduled for CHEST CT W/O CONTRAST ON 10/10/23 at [facility].  Non-Cardiac CT scanning, (CAT scanning), is a noninvasive, special x-ray that produces cross-sectional images of the body using x-rays and a computer. CT scans help physicians diagnose and treat medical conditions. For some CT exams, a contrast material is used to enhance visibility in the area of the body being studied. CT scans provide greater clarity and reveal more details than regular x-ray exams.

## 2023-10-08 NOTE — Patient Instructions (Signed)
Medication Instructions:  Your physician recommends that you continue on your current medications as directed. Please refer to the Current Medication list given to you today.    *If you need a refill on your cardiac medications before your next appointment, please call your pharmacy*   Lab Work: None    If you have labs (blood work) drawn today and your tests are completely normal, you will receive your results only by: MyChart Message (if you have MyChart) OR A paper copy in the mail If you have any lab test that is abnormal or we need to change your treatment, we will call you to review the results.   Testing/Procedures: Non-Cardiac CT scanning, (CAT scanning), is a noninvasive, special x-ray that produces cross-sectional images of the body using x-rays and a computer. CT scans help physicians diagnose and treat medical conditions. For some CT exams, a contrast material is used to enhance visibility in the area of the body being studied. CT scans provide greater clarity and reveal more details than regular x-ray exams.    Follow-Up: At [facility], you and your health needs are our priority.  As part of our continuing mission to provide you with exceptional heart care, we have created designated Provider Care Teams.  These Care Teams include your primary Cardiologist (physician) and Advanced Practice Providers (APPs -  Physician Assistants and Nurse Practitioners) who all work together to provide you with the care you need, when you need it.  We recommend signing up for the patient portal called "MyChart".  Sign up information is provided on this After Visit Summary.  MyChart is used to connect with patients for Virtual Visits (Telemedicine).  Patients are able to view lab/test results, encounter notes, upcoming appointments, etc.  Non-urgent messages can be sent to your provider as well.   To learn more about what you can do with MyChart, go to [email or web address].    Your next  appointment:   1 year(s)  The format for your next appointment:   In Person  Provider:   Reatha Harps, MD    Other Instructions

## 2023-10-09 NOTE — Telephone Encounter (Signed)
Patient returned RN's call. 

## 2023-10-10 ENCOUNTER — Ambulatory Visit (HOSPITAL_COMMUNITY): Payer: 59

## 2023-10-10 NOTE — Telephone Encounter (Signed)
Patient returned call. Able to get chest CT rescheduled for 12/17 at 8:30am. Patient verbalized understanding that he needs to be there at 8am ( early) for appt. He will have [employer] transport to take him to and from appt. Provided address and phone number to [facility]. No further questions at this time.

## 2023-10-10 NOTE — Telephone Encounter (Signed)
Used interpretation services to call patient again. Unable to reach him to notify him of chest CT appt today at 11:30am. Left vmtcb. Also called son and daughter number on file but no answer. Left vm.  Sent message to triage nurses informing them to update patient on appt date and time should he call back in and I am unavailable.     Update eff 10:06am - patient did not return call within 1hr of appt time. Called central scheduling to cancel CT appt until I can speak with patient.

## 2023-10-11 ENCOUNTER — Encounter: Payer: Self-pay | Admitting: Cardiovascular Disease

## 2023-10-11 NOTE — Telephone Encounter (Signed)
Error

## 2023-10-15 ENCOUNTER — Encounter: Payer: Self-pay | Admitting: Cardiovascular Disease

## 2023-10-15 NOTE — Telephone Encounter (Signed)
error 

## 2023-10-16 ENCOUNTER — Other Ambulatory Visit (HOSPITAL_COMMUNITY): Payer: 59

## 2023-10-16 ENCOUNTER — Ambulatory Visit (HOSPITAL_COMMUNITY)
Admission: RE | Admit: 2023-10-16 | Discharge: 2023-10-16 | Disposition: A | Discharge status: Home / Self Care | Payer: 59 | Source: Ambulatory Visit | Attending: Cardiovascular Disease | Admitting: Cardiovascular Disease

## 2023-10-16 DIAGNOSIS — I77819 Aortic ectasia, unspecified site: Secondary | ICD-10-CM | POA: Diagnosis not present

## 2023-10-16 DIAGNOSIS — I700 Atherosclerosis of aorta: Secondary | ICD-10-CM | POA: Diagnosis not present

## 2023-10-16 NOTE — Progress Notes (Deleted)
    SUBJECTIVE:   CHIEF COMPLAINT / HPI:   T2DM Diet controlled.  Last A1c of 6.8 in May 2024.  Dizziness Very chronic.   ***have consider vestibular rehab although transportation is an issue for him***concern that decreased hearing could be contributing, ENT recommended hearing aids and previously referred him to neurology which was discussed at the last visit and I provided him with contact information for [facility].  Referral has now been closed.   PERTINENT  PMH / PSH: T2DM, HTN, HLD, dizziness, anxiety  OBJECTIVE:   There were no vitals taken for this visit. ***  General: NAD, pleasant, able to participate in exam Cardiac: RRR, no murmurs. Respiratory: CTAB, normal effort, No wheezes, rales or rhonchi Abdomen: Bowel sounds present, nontender, nondistended, no hepatosplenomegaly. Extremities: no edema or cyanosis. Skin: warm and dry, no rashes noted Neuro: alert, no obvious focal deficits Psych: Normal affect and mood  ASSESSMENT/PLAN:   No problem-specific Assessment & Plan notes found for this encounter.   Shingle vaccine ***  Schedule apt to discuss hearing aids: [facility], [facility] Th[facility]  [address]  [address]  [place], [place] [address]  [phone]     Dr. Erick Alley, DO [place] [facility]    {    This will disappear when note is signed, click to select method of visit    :1}

## 2023-10-17 ENCOUNTER — Ambulatory Visit: Payer: 59 | Admitting: Student

## 2023-11-09 ENCOUNTER — Telehealth: Payer: Self-pay

## 2023-11-12 ENCOUNTER — Ambulatory Visit (INDEPENDENT_AMBULATORY_CARE_PROVIDER_SITE_OTHER): Payer: 59 | Admitting: Diagnostic Neuroimaging

## 2023-11-12 ENCOUNTER — Encounter: Payer: Self-pay | Admitting: Diagnostic Neuroimaging

## 2023-11-12 VITALS — BP 108/64 | HR 60

## 2023-11-12 DIAGNOSIS — R519 Headache, unspecified: Secondary | ICD-10-CM | POA: Diagnosis not present

## 2023-11-12 DIAGNOSIS — R42 Dizziness and giddiness: Secondary | ICD-10-CM | POA: Diagnosis not present

## 2023-11-12 DIAGNOSIS — G4709 Other insomnia: Secondary | ICD-10-CM | POA: Diagnosis not present

## 2023-11-12 NOTE — Patient Instructions (Signed)
  CHRONIC HEADACHES + intermittent dizziness (worsening in last 1 year) - check MRI brain w/wo  DAYTIME FATIGUE, IRREGULAR SLEEP SCHEDULE, INSOMNIA - check sleep study to rule out OSA

## 2023-11-12 NOTE — Progress Notes (Signed)
 [facility]  PATIENT: Omar Sanders DOB: Apr 21, 1941  REFERRING CLINICIAN: Joshua Domino, DO HISTORY FROM: patient  REASON FOR VISIT: new consult   HISTORICAL  CHIEF COMPLAINT:  Chief Complaint  Patient presents with   New Patient (Initial Visit)    Pt in 6 with interpreter Pt here for headaches and dizziness Pt states daily headaches Pt states when he bends over to pick something up he becomes dizzy       HISTORY OF PRESENT ILLNESS:   83 year old male here for evaluation of chronic headaches and dizziness.  Patient had intermittent headaches starting around age 72 years old with generalized global headaches.  No nausea or vomiting.  No sensitive light or sound.  Humanities conservatively with over-the-counter medications and rest.  In 2002 patient moved from [place] to the [place] .  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  He was having increasing stress at that time.  Since that time has had almost daily headaches.  Similar global vertex headaches, sometimes on the left side, without any associated symptoms.  No nausea or vomiting.  No sensitive light or sound.  Has also had some intermittent dizziness where he feels like he could fall over if he bends over to pick something up.  He denies any spinning sensation.  No lightheadedness.  Also has daytime sleepiness, interrupted sleep, and some insomnia.  Sometimes he feels sleepy in the afternoon or around 6 PM.  However by 10 PM or midnight he feels wide-awake.   REVIEW OF SYSTEMS: Full 14 system review of systems performed and negative with exception of: as per HPI.  ALLERGIES: Allergies  Allergen Reactions   Pantoprazole  Other (See Comments)    Chest feels hot, stomach irritation   Aspirin  Other (See Comments)    Abdominal pain    Lubiprostone  Other (See Comments)    Stomach irritation    Vitamin C Other (See Comments)    Abdominal pain    HOME  MEDICATIONS: Outpatient Medications Prior to Visit  Medication Sig Dispense Refill   acetaminophen  (TYLENOL ) 500 MG tablet Take 500 mg by mouth as directed.     alfuzosin (UROXATRAL) 10 MG 24 hr tablet Take 10 mg by mouth daily with breakfast.     atorvastatin  (LIPITOR) 40 MG tablet TAKE 1 TABLET(40 MG) BY MOUTH DAILY 90 tablet 3   bisacodyl  (DULCOLAX) 5 MG EC tablet Take 5 mg by mouth as directed.     cetirizine  (ZYRTEC ) 10 MG tablet Take 1 tablet (10 mg total) by mouth daily. 30 tablet 2   Cholecalciferol  (VITAMIN D3) 250 MCG (10000 UT) TABS Take by mouth.     dexlansoprazole  (DEXILANT ) 60 MG capsule Take 1 capsule (60 mg total) by mouth daily. 30 capsule 11   diclofenac  Sodium (VOLTAREN ) 1 % GEL Apply 4 g topically 4 (four) times daily. 350 g 2   famotidine  (PEPCID ) 40 MG tablet Take 1 tablet (40 mg total) by mouth at bedtime as needed for heartburn or indigestion. 30 tablet 11   finasteride  (PROSCAR ) 5 MG tablet Take 5 mg by mouth daily.     fluticasone  (FLONASE ) 50 MCG/ACT nasal spray SHAKE LIQUID AND USE 2 SPRAYS IN EACH NOSTRIL DAILY AS NEEDED FOR ALLERGIES 48 g 0   halobetasol  (ULTRAVATE ) 0.05 % cream APPLY TOPICALLY TO THE AFFECTED AREA DAILY 50 g 2   lisinopril  (ZESTRIL ) 20 MG tablet Take 20 mg by mouth daily.     Olopatadine  HCl 0.2 %  SOLN Apply 1 drop to eye daily. Apply to both eyes once daily. 2.5 mL 0   polyethylene glycol powder (GLYCOLAX /MIRALAX ) 17 GM/SCOOP powder TAKE 1 SCOOP(17 GRAM) DAILY FOR CONSTIPATION 510 g 0   Polyvinyl Alcohol -Povidone PF (REFRESH) 1.4-0.6 % SOLN Apply 1 drop to eye daily. 50 each 1   Propylene Glycol (SYSTANE COMPLETE OP) Apply to eye.     tamsulosin  (FLOMAX ) 0.4 MG CAPS capsule Take 0.4 mg by mouth.     torsemide  (DEMADEX ) 20 MG tablet Take 20 mg by mouth daily.     UNABLE TO FIND Med Name: Natural ear drops     lidocaine  (HM LIDOCAINE  PATCH) 4 % Place 1 patch onto the skin daily. (Patient not taking: Reported on 10/08/2023) 30 patch 1   lisinopril   (ZESTRIL ) 5 MG tablet Take 1 tablet (5 mg total) by mouth daily. 90 tablet 3   No facility-administered medications prior to visit.    PAST MEDICAL HISTORY: Past Medical History:  Diagnosis Date   Abdominal bloating 07/20/2020   Allergy    Anal fissure    Anemia    Anxiety    Arthritis    all over body    Benign prostatic hyperplasia    Bilateral lower extremity edema 03/22/2018   Cataract    removed bilat    Chest pain 02/2018   Chronic kidney disease    Chronic kidney disease, stage 3a (HCC) 01/19/2016   Chronic pain    Colon polyp    Diabetes mellitus without complication (HCC)    Falls 05/19/2021   GERD (gastroesophageal reflux disease)    Headache(784.0)    Hemorrhoids    Hepatic steatosis 05/19/2021   Hyperlipidemia    Hx: of   Hypertension    IBS (irritable bowel syndrome)    Incontinent of urine    Hx:of   Migraines    Neuromuscular disorder (HCC)    periferal neuropathy per patient   New onset right bundle branch block (RBBB)    OSA (obstructive sleep apnea) 11/2009   sleep study with mild OSA, pt unwilling to use CPAP   Seizures (HCC)    pt states he has never had a seizure    Sleep apnea    mild    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    PAST SURGICAL HISTORY: Past Surgical History:  Procedure Laterality Date   CARDIAC CATHETERIZATION  06/04/2009   mild to moderate CAD  w/o hemodynamic significant lesion   CATARACT EXTRACTION     bilat   CHOLECYSTECTOMY  2008   COLONOSCOPY  2012   Dr Kristie   HEMORRHOID SURGERY     anal surgery   INGUINAL HERNIA REPAIR Bilateral 03/11/2013   Procedure: LAPAROSCOPIC BILATERAL INGUINAL HERNIA REPAIR;  Surgeon: Lynda Leos, MD;  Location: [facility] OR;  Service: General;  Laterality: Bilateral;   INSERTION OF MESH Bilateral 03/11/2013   Procedure: INSERTION OF MESH;  Surgeon: Lynda Leos, MD;  Location: [facility] OR;  Service: General;  Laterality: Bilateral;   NM MYOCAR PERF WALL MOTION   05/27/2009   mild iscemia mid inferio & apical regions.   NM MYOCAR PERF WALL MOTION  11/01/2012   negative for ischemia   POLYPECTOMY     TRANSURETHRAL RESECTION OF PROSTATE N/A 02/18/2021   Procedure: TRANSURETHRAL RESECTION OF THE PROSTATE (TURP), BIPOLAR;  Surgeon: Devere Lonni Righter, MD;  Location: [facility] ORS;  Service: Urology;  Laterality: N/A;   US  ECHOCARDIOGRAPHY  05/27/2009   mild  mitral annular ca+,AOV mildly sclerotic    FAMILY HISTORY: Family History  Problem Relation Age of Onset   Hypertension Mother    Colon cancer Neg Hx    Colon polyps Neg Hx    Esophageal cancer Neg Hx    Rectal cancer Neg Hx    Stomach cancer Neg Hx    Migraines Neg Hx    Headache Neg Hx     SOCIAL HISTORY: Social History   Socioeconomic History   Marital status: Widowed    Spouse name: Not on file   Number of children: 5   Years of education: 16   Highest education level: Bachelor's degree (e.g., BA, AB, BS)  Occupational History   Not on file  Tobacco Use   Smoking status: Never   Smokeless tobacco: Never  Vaping Use   Vaping status: Never Used  Substance and Sexual Activity   Alcohol  use: No   Drug use: No   Sexual activity: Not Currently  Other Topics Concern   Not on file  Social History Narrative   Mr Franks lives with his son and daughter with the daughter's two children ages.  Two of his daughter's children are off in college (04/2021)   Mr Morandi has two other children in [place].      Mr Loya is by himself much of the day as his daughter works long day shifts.    Mr. Ribeiro cooks, does laundry for family, and housework.       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 Wt Readings from Last 3 Encounters:  10/08/23 173 lb 12.8 oz (78.8 kg)  07/25/23 177 lb 4 oz (80.4 kg)  03/23/23 178 lb 3.2 oz (80.8 kg)  Orthostatic VS for the past 24 hrs (Last 3 readings):  BP- Lying Pulse- Lying BP- Standing at 0 minutes Pulse- Standing at 0 minutes BP- Standing at 3 minutes Pulse- Standing at 3 minutes  11/12/23 0939 -- -- -- -- 110/64 59  11/12/23 0929 108/64 60 116/63 65 -- --    Patient is in no distress; well developed, nourished and groomed; neck is supple  CARDIOVASCULAR: Examination of carotid arteries is normal; no carotid bruits Regular rate and rhythm, no murmurs Examination of peripheral vascular system by observation and palpation is normal  EYES: Ophthalmoscopic exam of optic discs and posterior segments is normal; no papilledema or hemorrhages No results found.  MUSCULOSKELETAL: Gait, strength, tone, movements noted in Neurologic exam below  NEUROLOGIC: MENTAL STATUS:      No data to display         awake, alert, oriented to person, place and time recent and remote memory intact normal attention and concentration language fluent, comprehension intact, naming intact fund of knowledge appropriate  CRANIAL NERVE:  2nd - no papilledema on fundoscopic exam 2nd, 3rd, 4th, 6th - pupils equal and reactive to light, visual fields full to  confrontation, extraocular muscles intact, no nystagmus 5th - facial sensation symmetric 7th - facial strength symmetric 8th - hearing intact 9th - palate elevates symmetrically, uvula midline 11th - shoulder shrug symmetric 12th - tongue protrusion midline  MOTOR:  normal bulk and tone, full strength in the BUE, BLE  SENSORY:  normal and symmetric to light  touch, temperature, vibration  COORDINATION:  finger-nose-finger, fine finger movements normal  REFLEXES:  deep tendon reflexes 1+ and symmetric  GAIT/STATION:  narrow based gait     DIAGNOSTIC DATA (LABS, IMAGING, TESTING) - I reviewed patient records, labs, notes, testing and imaging myself where available.  Lab Results  Component Value Date   WBC 7.8 02/19/2021   HGB 11.7 (L) 02/19/2021   HCT 36.6 (L) 02/19/2021   MCV 83.6 02/19/2021   PLT 201 02/19/2021      Component Value Date/Time   NA 134 03/02/2023 1220   K 4.3 03/02/2023 1220   CL 100 03/02/2023 1220   CO2 20 03/02/2023 1220   GLUCOSE 116 (H) 03/02/2023 1220   GLUCOSE 107 (H) 02/18/2021 1100   BUN 17 03/02/2023 1220   CREATININE 1.57 (H) 03/02/2023 1220   CREATININE 1.55 (H) 07/31/2016 1502   CALCIUM  9.0 03/02/2023 1220   PROT 7.4 11/22/2019 1148   PROT 6.9 07/15/2019 1536   ALBUMIN 3.4 (L) 11/22/2019 1148   ALBUMIN 4.1 07/15/2019 1536   AST 29 11/22/2019 1148   ALT 25 11/22/2019 1148   ALKPHOS 52 11/22/2019 1148   BILITOT 0.9 11/22/2019 1148   BILITOT 0.4 07/15/2019 1536   GFRNONAA 54 (L) 02/18/2021 1100   GFRNONAA 43 (L) 07/31/2016 1502   GFRAA 55 (L) 08/23/2020 1129   GFRAA 49 (L) 07/31/2016 1502   Lab Results  Component Value Date   CHOL 108 03/02/2023   HDL 26 (L) 03/02/2023   LDLCALC 40 03/02/2023   TRIG 275 (H) 03/02/2023   CHOLHDL 4.2 03/02/2023   Lab Results  Component Value Date   HGBA1C 6.8 03/02/2023   Lab Results  Component Value Date   VITAMINB12 559 07/10/2017   Lab Results  Component Value Date   TSH 2.900 03/23/2023       ASSESSMENT AND PLAN  83 y.o. year old male here with:   Dx:  1. Chronic daily headache   2. Dizziness   3. Other insomnia       PLAN:  CHRONIC HEADACHES + intermittent dizziness (since age 17 years old, but worsening in last 1 year) - check MRI brain w/wo (rule out mass, infx, stroke)  DAYTIME FATIGUE, IRREGULAR SLEEP SCHEDULE,  INSOMNIA - check sleep study to rule out OSA  Orders Placed This Encounter  Procedures   MR BRAIN W WO CONTRAST   Ambulatory referral to Sleep Studies   Return for pending test results, pending if symptoms worsen or fail to improve.    EDUARD FABIENE HANLON, MD 11/12/2023, 10:05 AM Certified in Neurology, Neurophysiology and Neuroimaging  [facility] [address] [place], [place] [address] [phone]

## 2023-11-14 ENCOUNTER — Telehealth: Payer: Self-pay | Admitting: Diagnostic Neuroimaging

## 2023-11-14 NOTE — Telephone Encounter (Addendum)
Pt called trying to explain that he was to get a call regarding his last appt. He did not have an interpreter but I was finally able to understand that he was calling to see what other things were ordered for him due to his appt. I tried to explain to him that an MRI order has been put in for him. He requested that I call his daughter Warren Danes and inform her. I called his daughter who in turn requested that I give the information to her son in [place]. I informed the son that a MRI is needing to be scheduled for his grandfather and gave him GI's name and number so that he can call and schedule for his grandfather. Family verbalized understanding and appreciation.

## 2023-11-15 ENCOUNTER — Other Ambulatory Visit: Payer: Self-pay

## 2023-11-15 DIAGNOSIS — J302 Other seasonal allergic rhinitis: Secondary | ICD-10-CM

## 2023-11-15 MED ORDER — FLUTICASONE PROPIONATE 50 MCG/ACT NA SUSP: 1.0000 | Freq: Every day | NASAL | 0 refills | Status: DC

## 2023-11-15 NOTE — Telephone Encounter (Signed)
Left a message

## 2023-11-25 NOTE — Progress Notes (Unsigned)
    SUBJECTIVE:   CHIEF COMPLAINT / HPI:   T2DM Diet controlled. A1c 6.6 today   CKD 3b follows with nephrology every 6 months.  Cannot recall when he last saw them.  Is on an ACE. Not on SGLT2   HTN On lisinoprol 5 mg daily, does not take if SBP if <100.    PERTINENT  PMH / PSH: T2DM, CKD, HTN   OBJECTIVE:   BP 139/69 (BP Location: Left Arm, Patient Position: Sitting, Cuff Size: Normal)   Pulse (!) 59   Wt 176 lb 3.2 oz (79.9 kg)   SpO2 99%   BMI 29.32 kg/m    General: NAD, pleasant, able to participate in exam Cardiac: RRR, no murmurs. Respiratory: Breathing comfortably on room air Abdomen: Bowel sounds present, nontender, nondistended, no hepatosplenomegaly. Extremities: Dorsalis pedis and posterior tibial pulses present, no deformities or wounds of feet, sensation intact with monofilament testing Skin: warm and dry Neuro: alert, no obvious focal deficits Psych: Normal affect and mood  ASSESSMENT/PLAN:   Hypertension associated with diabetes (HCC) Well-controlled, continue lisinopril 5 mg daily. Agree with not taking if SBP less than 100.  Type 2 diabetes mellitus with diabetic chronic kidney disease (HCC) Diet controlled, A1c 6.6. Performed diabetic foot exam today with no concerns Next A1c in 6 months  CKD (chronic kidney disease), stage IV (HCC) Unsure when patient last saw nephrology. -BMP today -Microalbumin creatinine ratio  Hearing problem of both ears Previously saw ENT with [facility] and was recommended to get hearing aids.  Patient states insurance is requiring him to see ENT in [facility] network. -Referral for [facility] placed  Patient to return for follow-up in ~4 months   Tdap and COVID given today.  Had 1 Shingrix vaccine in 2020, reordered with note to pharmacy that he only needs 1 more  Per insurance was told he needed to see [facility]  Dr. Erick Alley, DO [facility]

## 2023-11-27 ENCOUNTER — Encounter: Payer: Self-pay | Admitting: Student

## 2023-11-27 ENCOUNTER — Telehealth: Payer: Self-pay

## 2023-11-27 ENCOUNTER — Ambulatory Visit: Payer: 59 | Admitting: Student

## 2023-11-27 VITALS — BP 139/69 | HR 59 | Wt 176.2 lb

## 2023-11-27 DIAGNOSIS — E1159 Type 2 diabetes mellitus with other circulatory complications: Secondary | ICD-10-CM

## 2023-11-27 DIAGNOSIS — H9193 Unspecified hearing loss, bilateral: Secondary | ICD-10-CM | POA: Diagnosis not present

## 2023-11-27 DIAGNOSIS — Z23 Encounter for immunization: Secondary | ICD-10-CM

## 2023-11-27 DIAGNOSIS — E1122 Type 2 diabetes mellitus with diabetic chronic kidney disease: Secondary | ICD-10-CM

## 2023-11-27 DIAGNOSIS — N184 Chronic kidney disease, stage 4 (severe): Secondary | ICD-10-CM

## 2023-11-27 DIAGNOSIS — I152 Hypertension secondary to endocrine disorders: Secondary | ICD-10-CM | POA: Diagnosis not present

## 2023-11-27 LAB — POCT GLYCOSYLATED HEMOGLOBIN (HGB A1C): HbA1c, POC (controlled diabetic range): 6.6 % (ref 0.0–7.0)

## 2023-11-27 MED ORDER — ZOSTER VAC RECOMB ADJUVANTED 50 MCG/0.5ML IM SUSR: 0.5000 mL | Freq: Once | INTRAMUSCULAR | 0 refills | Status: AC

## 2023-11-27 NOTE — Assessment & Plan Note (Addendum)
Diet controlled, A1c 6.6. Performed diabetic foot exam today with no concerns Next A1c in 6 months

## 2023-11-27 NOTE — Assessment & Plan Note (Signed)
Well-controlled, continue lisinopril 5 mg daily. Agree with not taking if SBP less than 100.

## 2023-11-27 NOTE — Patient Instructions (Addendum)
It was great to see you! Thank you for allowing me to participate in your care!  I recommend that you always bring your medications to each appointment as this makes it easy to ensure you are on the correct medications and helps [place] not miss when refills are needed.  Our plans for today:  - Get brain MRI (scheduled for 12/27/23 9:20 am at [facility] [address] - continue current medications - go to the pharmacy for your shingle vaccine  - you received COVID and Tdap vaccines today  - you will be called to schedule apt with ENT doctor    We are checking some labs today, I will call you if they are abnormal will send you a MyChart message or a letter if they are normal.  If you do not hear about your labs in the next 2 weeks please let us know.  Take care and seek immediate care sooner if you develop any concerns.   Dr. Erick Alley, DO [facility]

## 2023-11-27 NOTE — Assessment & Plan Note (Signed)
Unsure when patient last saw nephrology. -BMP today -Microalbumin creatinine ratio

## 2023-11-27 NOTE — Telephone Encounter (Signed)
Patient calls nurse line requesting to speak with PCP.   He reports he is not able to get in touch with his "kidney" doctor.   I see in provider note today he was instructed to contact them for an apt.   I could not understand exactly what the patient was needing or having trouble with in regards to making an apt with them.   I advised the patient I would call him back with an interpreter to better assist him.   Using a [place] interpreter I called the patient x2 with no answer.

## 2023-11-27 NOTE — Assessment & Plan Note (Signed)
Previously saw ENT with [facility] and was recommended to get hearing aids.  Patient states insurance is requiring him to see ENT in [facility] network. -Referral for [facility] placed

## 2023-11-28 ENCOUNTER — Encounter: Payer: Self-pay | Admitting: Student

## 2023-11-28 LAB — BASIC METABOLIC PANEL
BUN/Creatinine Ratio: 15 (ref 10–24)
BUN: 22 mg/dL (ref 8–27)
CO2: 22 mmol/L (ref 20–29)
Calcium: 8.4 mg/dL — ABNORMAL LOW (ref 8.6–10.2)
Chloride: 103 mmol/L (ref 96–106)
Creatinine, Ser: 1.47 mg/dL — ABNORMAL HIGH (ref 0.76–1.27)
Glucose: 131 mg/dL — ABNORMAL HIGH (ref 70–99)
Potassium: 4 mmol/L (ref 3.5–5.2)
Sodium: 140 mmol/L (ref 134–144)
eGFR: 47 mL/min/{1.73_m2} — ABNORMAL LOW (ref >59)

## 2023-11-28 LAB — MICROALBUMIN / CREATININE URINE RATIO
Creatinine, Urine: 73.2 mg/dL
Microalb/Creat Ratio: <4 mg/g{creat} (ref 0–29)
Microalbumin, Urine: <3 ug/mL

## 2023-12-05 DIAGNOSIS — E1122 Type 2 diabetes mellitus with diabetic chronic kidney disease: Secondary | ICD-10-CM | POA: Diagnosis not present

## 2023-12-05 DIAGNOSIS — D631 Anemia in chronic kidney disease: Secondary | ICD-10-CM | POA: Diagnosis not present

## 2023-12-05 DIAGNOSIS — R609 Edema, unspecified: Secondary | ICD-10-CM | POA: Diagnosis not present

## 2023-12-05 DIAGNOSIS — N1832 Chronic kidney disease, stage 3b: Secondary | ICD-10-CM | POA: Diagnosis not present

## 2023-12-05 DIAGNOSIS — E559 Vitamin D deficiency, unspecified: Secondary | ICD-10-CM | POA: Diagnosis not present

## 2023-12-05 DIAGNOSIS — I129 Hypertensive chronic kidney disease with stage 1 through stage 4 chronic kidney disease, or unspecified chronic kidney disease: Secondary | ICD-10-CM | POA: Diagnosis not present

## 2023-12-05 DIAGNOSIS — N189 Chronic kidney disease, unspecified: Secondary | ICD-10-CM | POA: Diagnosis not present

## 2023-12-07 NOTE — Telephone Encounter (Signed)
 Looks like we referred him there 07/2018, is that so?   If so, we are covered to fax those over without a release. Macario Savin, CMA

## 2023-12-07 NOTE — Telephone Encounter (Signed)
 Pt reports that his kidney doctor says he does not need to see him for 6 months and he is worried and would like to let him have the results.   He wants to know if we can send them over to them. Macario Savin, CMA

## 2023-12-27 ENCOUNTER — Ambulatory Visit
Admission: RE | Admit: 2023-12-27 | Discharge: 2023-12-27 | Disposition: A | Discharge status: Home / Self Care | Payer: 59 | Source: Ambulatory Visit | Attending: Diagnostic Neuroimaging | Admitting: Diagnostic Neuroimaging

## 2023-12-27 DIAGNOSIS — R42 Dizziness and giddiness: Secondary | ICD-10-CM

## 2023-12-27 DIAGNOSIS — R519 Headache, unspecified: Secondary | ICD-10-CM

## 2023-12-27 DIAGNOSIS — G4709 Other insomnia: Secondary | ICD-10-CM

## 2024-01-11 ENCOUNTER — Ambulatory Visit
Admission: RE | Admit: 2024-01-11 | Discharge: 2024-01-11 | Disposition: A | Discharge status: Home / Self Care | Payer: 59 | Source: Ambulatory Visit | Attending: Diagnostic Neuroimaging | Admitting: Diagnostic Neuroimaging

## 2024-01-11 DIAGNOSIS — G4709 Other insomnia: Secondary | ICD-10-CM | POA: Diagnosis not present

## 2024-01-11 DIAGNOSIS — R42 Dizziness and giddiness: Secondary | ICD-10-CM | POA: Diagnosis not present

## 2024-01-11 DIAGNOSIS — R519 Headache, unspecified: Secondary | ICD-10-CM | POA: Diagnosis not present

## 2024-01-11 MED ORDER — GADOPICLENOL 0.5 MMOL/ML IV SOLN
8.0000 mL | Freq: Once | INTRAVENOUS | Status: AC | PRN
  Administered 2024-01-11: 8 mL via INTRAVENOUS

## 2024-01-18 NOTE — Progress Notes (Signed)
 Results are good, no major findings. Continue current plan. -VRP

## 2024-01-29 ENCOUNTER — Other Ambulatory Visit: Payer: Self-pay | Admitting: Student

## 2024-01-29 DIAGNOSIS — J302 Other seasonal allergic rhinitis: Secondary | ICD-10-CM

## 2024-01-30 ENCOUNTER — Encounter (INDEPENDENT_AMBULATORY_CARE_PROVIDER_SITE_OTHER): Payer: Self-pay

## 2024-01-30 ENCOUNTER — Ambulatory Visit (INDEPENDENT_AMBULATORY_CARE_PROVIDER_SITE_OTHER): Payer: 59 | Admitting: Otolaryngology

## 2024-01-30 ENCOUNTER — Other Ambulatory Visit: Payer: Self-pay | Admitting: Student

## 2024-01-30 DIAGNOSIS — R42 Dizziness and giddiness: Secondary | ICD-10-CM

## 2024-01-30 DIAGNOSIS — H903 Sensorineural hearing loss, bilateral: Secondary | ICD-10-CM

## 2024-01-30 DIAGNOSIS — R2689 Other abnormalities of gait and mobility: Secondary | ICD-10-CM

## 2024-01-30 DIAGNOSIS — J302 Other seasonal allergic rhinitis: Secondary | ICD-10-CM

## 2024-01-30 MED ORDER — OFLOXACIN 0.3 % OT SOLN: 4.0000 [drp] | Freq: Two times a day (BID) | OTIC | 1 refills | Status: AC

## 2024-01-30 MED ORDER — BETAMETHASONE DIPROPIONATE 0.05 % EX CREA
TOPICAL_CREAM | Freq: Two times a day (BID) | CUTANEOUS | 0 refills | Status: AC
Start: 2024-01-30 — End: ?

## 2024-01-30 NOTE — Patient Instructions (Addendum)
 Use betamethasone ointment twice daily for 14 days in the ear canal in both ears (just outside ear hole) Use ofloxacin drops 4 drops two times per day to both ears for 14 days

## 2024-01-30 NOTE — Progress Notes (Signed)
 Dear Dr. Andree Kayser, Here is my assessment for our mutual patient, Omar Sanders. Thank you for allowing me the opportunity to care for your patient. Please do not hesitate to contact me should you have any other questions. Sincerely, Dr. Milon Aloe  Otolaryngology Clinic Note Referring provider: Dr. Andree Kayser HPI:  Omar Sanders is a 83 y.o. male kindly referred by Dr. Andree Kayser for evaluation of hearing loss  Initial visit (01/2024): Patient reports: chronic bilateral itching and hearing loss; ongoing for two years. He reports that he has not tried any drops for the ears. He also reports some bilateral hearing loss, gradual in onset, which has been ongoing for over 6-7 years, but worse on the right. Reports it is the same. He does report bilateral tinnitus as well, not pulsatile. Does use qtips. Prior evaluated by [facility] (Dr. Ralston Burkes) Patient denies: ear pain, fullness, vertigo, drainage Patient additionally denies: deep pain in ear canal, eustachian tube symptoms such as popping/crackling, sensitive to pressure changes Patient also denies barotrauma, vestibular suppressant use, ototoxic medication use Prior ear surgery: no  He also also seen neurology for lightheadedness but denies vertigo.   Remote vietnamese interpreted used: Vivian. The visit was quite difficult to conduct as he is hard of hearing.  H&N Surgery: no Personal or FHx of bleeding dz or anesthesia difficulty: no  Tobacco: no  PMHx: Arthritis, CKD, DM, HTN, Headaches, RBBB  Independent Review of Additional Tests or Records:  Dr. Ralston Burkes 02/23/2023: Noted bilateral hearing loss, and dizziness; noted headache as well; he also reports itching both ears; prior HT noted; Dx: SNHL, Headache, dizziness; Rx: steroid ear drops, ref to neuro Dr. Salli Crawley (11/12/2023): headaches and dizziness; worse stress, vertex headaches; Dx: Chronic headaches; Rx: MRI Brain Dr. Rochelle Chu (11/27/2023): noted hearing problems, rec HA but [facility] not covered, needs [facility]; Rx: ref to ENT Audiogram reviewed (01/2023):  Omar Sanders (11/02/2022) AuD: decreased hearing; independently reviewed, noted essentially SNHL in both ears, asymmetric with right > left; WRT not completed  MRI Brain 01/11/2024 independently interpreted with respect to ears: no mastoid or ME effusion noted; no IAC cuts so suboptimal study but no retrocochlear lesion noted  PMH/Meds/All/SocHx/FamHx/ROS:   Past Medical History:  Diagnosis Date   Abdominal bloating 07/20/2020   Allergy    Anal fissure    Anemia    Anxiety    Arthritis    all over body    Benign prostatic hyperplasia    Bilateral lower extremity edema 03/22/2018   Cataract    removed bilat    Chest pain 02/2018   Chronic kidney disease    Chronic kidney disease, stage 3a (HCC) 01/19/2016   Chronic pain    Colon polyp    Diabetes mellitus without complication (HCC)    Falls 05/19/2021   GERD (gastroesophageal reflux disease)    Headache(784.0)    Hemorrhoids    Hepatic steatosis 05/19/2021   Hyperlipidemia    Hx: of   Hypertension    IBS (irritable bowel syndrome)    Incontinent of urine    Hx:of   Migraines    Neuromuscular disorder (HCC)    periferal neuropathy per patient   New onset right bundle branch block (RBBB)    OSA (obstructive sleep apnea) 11/2009   sleep study with mild OSA, pt unwilling to use CPAP   Seizures (HCC)    pt states he has never had a seizure    Sleep apnea    mild    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     Past Surgical History:  Procedure Laterality Date   CARDIAC CATHETERIZATION  06/04/2009   mild to moderate CAD  w/o hemodynamic significant lesion   CATARACT EXTRACTION     bilat   CHOLECYSTECTOMY  2008   COLONOSCOPY  2012   Dr Tova Fresh   HEMORRHOID SURGERY     anal surgery   INGUINAL HERNIA REPAIR Bilateral 03/11/2013   Procedure: LAPAROSCOPIC BILATERAL INGUINAL HERNIA REPAIR;  Surgeon: Shela Derby, MD;  Location: [facility] OR;  Service:  General;  Laterality: Bilateral;   INSERTION OF MESH Bilateral 03/11/2013   Procedure: INSERTION OF MESH;  Surgeon: Shela Derby, MD;  Location: [facility] OR;  Service: General;  Laterality: Bilateral;   NM MYOCAR PERF WALL MOTION  05/27/2009   mild iscemia mid inferio & apical regions.   NM MYOCAR PERF WALL MOTION  11/01/2012   negative for ischemia   POLYPECTOMY     TRANSURETHRAL RESECTION OF PROSTATE N/A 02/18/2021   Procedure: TRANSURETHRAL RESECTION OF THE PROSTATE (TURP), BIPOLAR;  Surgeon: Adelbert Homans, MD;  Location: [facility];  Service: Urology;  Laterality: N/A;   US  ECHOCARDIOGRAPHY  05/27/2009   mild mitral annular ca+,AOV mildly sclerotic    Family History  Problem Relation Age of Onset   Hypertension Mother    Colon cancer Neg Hx    Colon polyps Neg Hx    Esophageal cancer Neg Hx    Rectal cancer Neg Hx    Stomach cancer Neg Hx    Migraines Neg Hx    Headache Neg Hx      Social Connections: Not on file      Current Outpatient Medications:    betamethasone  dipropionate 0.05 % cream, Apply topically 2 (two) times daily. Apply just outside the ear canal two times per day for 2 weeks, Disp: 30 g, Rfl: 0   acetaminophen  (TYLENOL ) 500 MG tablet, Take 500 mg by mouth as directed., Disp: , Rfl:    alfuzosin (UROXATRAL) 10 MG 24 hr tablet, Take 10 mg by mouth daily with breakfast., Disp: , Rfl:    atorvastatin  (LIPITOR) 40 MG tablet, TAKE 1 TABLET(40 MG) BY MOUTH DAILY, Disp: 90 tablet, Rfl: 3   bisacodyl  (DULCOLAX) 5 MG EC tablet, Take 5 mg by mouth as directed. (Patient not taking: Reported on 02/14/2024), Disp: , Rfl:    cetirizine  (ZYRTEC ) 10 MG tablet, Take 1 tablet (10 mg total) by mouth daily., Disp: 30 tablet, Rfl: 2   Cholecalciferol  (VITAMIN D3) 250 MCG (10000 UT) TABS, Take by mouth. (Patient not taking: Reported on 02/14/2024), Disp: , Rfl:    dexlansoprazole  (DEXILANT ) 60 MG capsule, Take 1 capsule (60 mg total) by mouth daily. Office visit for further refills,  Disp: 30 capsule, Rfl: 2   diclofenac  Sodium (VOLTAREN ) 1 % GEL, Apply 4 g topically 4 (four) times daily. (Patient not taking: Reported on 02/14/2024), Disp: 350 g, Rfl: 2   famotidine  (PEPCID ) 40 MG tablet, Take 1 tablet (40 mg total) by mouth at bedtime as needed for heartburn or indigestion., Disp: 30 tablet, Rfl: 11   finasteride  (PROSCAR ) 5 MG tablet, Take 5 mg by mouth daily. (Patient not taking: Reported on 02/14/2024), Disp: , Rfl:    fluticasone  (FLONASE ) 50 MCG/ACT nasal spray, USE 1 SPRAY IN EACH NOSTRIL DAILY (Patient not taking: Reported on 02/14/2024), Disp: 16 g, Rfl: 1   halobetasol  (ULTRAVATE ) 0.05 % cream, APPLY TOPICALLY TO THE AFFECTED AREA DAILY, Disp: 50 g,  Rfl: 2   lisinopril  (ZESTRIL ) 5 MG tablet, Take 1 tablet (5 mg total) by mouth daily., Disp: 90 tablet, Rfl: 3   Olopatadine  HCl 0.2 % SOLN, Apply 1 drop to eye daily. Apply to both eyes once daily. (Patient not taking: Reported on 02/14/2024), Disp: 2.5 mL, Rfl: 0   polyethylene glycol powder (GLYCOLAX /MIRALAX ) 17 GM/SCOOP powder, TAKE 1 SCOOP(17 GRAM) DAILY FOR CONSTIPATION, Disp: 510 g, Rfl: 0   Polyvinyl Alcohol -Povidone PF (REFRESH) 1.4-0.6 % SOLN, Apply 1 drop to eye daily., Disp: 50 each, Rfl: 1   Propylene Glycol (SYSTANE COMPLETE OP), Apply to eye. (Patient not taking: Reported on 02/14/2024), Disp: , Rfl:    tamsulosin  (FLOMAX ) 0.4 MG CAPS capsule, Take 0.4 mg by mouth. (Patient not taking: Reported on 02/14/2024), Disp: , Rfl:    torsemide  (DEMADEX ) 20 MG tablet, Take 20 mg by mouth daily., Disp: , Rfl:    UNABLE TO FIND, Med Name: Natural ear drops (Patient not taking: Reported on 02/14/2024), Disp: , Rfl:    Physical Exam:   There were no vitals taken for this visit.  Salient findings:  CN II-XII intact Given history and complaints, ear microscopy was indicated and performed for evaluation with findings as below in physical exam section and in procedures;  Bilateral EAC clear and TM intact with aerated middle ear  it appears; bilateral eczematoid changes b/l EAC [address]: mid - but inconsistent response; language barrier made it difficult [address]: AC > BC b/l  Anterior rhinoscopy: Septum relatively midline; bilateral inferior turbinates without significant hypertrophy No lesions of oral cavity/oropharynx No obviously palpable neck masses/lymphadenopathy/thyromegaly No respiratory distress or stridor  Seprately Identifiable Procedures:  Procedure: Bilateral ear microscopy using microscope (CPT 92504) Pre-procedure diagnosis: hearing loss, type B tymps, rule out lesion Post-procedure diagnosis: same Indication: see above; given patient's otologic complaints and history, for improved and comprehensive examination of external ear and tympanic membrane, bilateral otologic examination using microscope was performed  Procedure: Patient was placed semi-recumbent. Both ear canals were examined using the microscope with findings above. Patient tolerated the procedure well.  Impression & Plans:  Omar Sanders is a 83 y.o. male with:  1. Sensorineural hearing loss (SNHL) of both ears   2. Imbalance   3. Dizziness    Noted prior b/l essentially SNHL with type B tymps (unclear exactly what caused this). He does have persistent significant SNHL based on audio in 2024, and would recommend amplification; unfortunately, unable to dispense HA currently but will update his audio to ensure he is an appropriate candidate.   He also does have itching b/l and likely due to qtip use and eczematoid changes. - Rec betamethasone  ointment BID x14d and ofloxacin  ear dorps BID x14d - stop qtip use  From imbalance standpoint, he's seen neuro and cardiac but he denies any frank vertigo or other sx suggestive of otologic etiology; did offer him formal vestib testing and he will consider this  F/u 6 weeks with audio  See below regarding exact medications prescribed this encounter including dosages and route: Meds ordered  this encounter  Medications   betamethasone  dipropionate 0.05 % cream    Sig: Apply topically 2 (two) times daily. Apply just outside the ear canal two times per day for 2 weeks    Dispense:  30 g    Refill:  0   ofloxacin  (FLOXIN ) 0.3 % OTIC solution    Sig: Place 4 drops into both ears 2 (two) times daily for 14 days.  Dispense:  10 mL    Refill:  1      Thank you for allowing me the opportunity to care for your patient. Please do not hesitate to contact me should you have any other questions.  Sincerely, Milon Aloe, MD Otolaryngologist (ENT), [facility] Phone: [phone] Fax: [phone]  02/17/2024, 1:34 PM   MDM:  Level 4 - 99204 Complexity/Problems addressed: mod - multiple chronic problems Data complexity: mod - independent review of notes, independent imaging interpretation, and ordering tests - Morbidity: mod  - Prescription Drug prescribed or managed: yes

## 2024-02-04 ENCOUNTER — Telehealth: Payer: Self-pay | Admitting: Physician Assistant

## 2024-02-04 MED ORDER — DEXLANSOPRAZOLE 60 MG PO CPDR: 60.0000 mg | DELAYED_RELEASE_CAPSULE | Freq: Every day | ORAL | 3 refills | Status: DC

## 2024-02-04 NOTE — Telephone Encounter (Signed)
 Patient called and stated that he is needing a refill on his DELIXANT. Patient is requesting a callback to let him know when the prescription has been sent over to the pharmacy. Please advise.

## 2024-02-04 NOTE — Telephone Encounter (Signed)
 Patient f/u on previous note. Please advise.

## 2024-02-05 MED ORDER — DEXLANSOPRAZOLE 60 MG PO CPDR: 60.0000 mg | DELAYED_RELEASE_CAPSULE | Freq: Every day | ORAL | 2 refills | Status: DC

## 2024-02-05 NOTE — Telephone Encounter (Signed)
 Dexilant sent to [facility]

## 2024-02-05 NOTE — Addendum Note (Signed)
 Addended by: Jeanine Luz on: 02/05/2024 11:21 AM   Modules accepted: Orders

## 2024-02-06 NOTE — Telephone Encounter (Signed)
 Called the [facility]. Left a verbal refill on Dexilant. Called the patient. No answer. Left him a voicemail advising the prescription has been given verbally and it was transmitted electronically. Let us know if there is still an issue. Also asked he call us soon to schedule an appointment.

## 2024-02-06 NOTE — Telephone Encounter (Signed)
 Inbound call from patient stating that he has not heard from anyone about his refill for Delixant. I advised patient that we had sent the refill to [facility]. He stated that he went to [facility] and they told him he needed to talk to his doctor. Patient is requesting a call back to discuss. Please advise.

## 2024-02-07 ENCOUNTER — Telehealth: Payer: Self-pay

## 2024-02-07 NOTE — Telephone Encounter (Signed)
 Patient called wanting a refill dexlansoprazole (DEXILANT) 60 MG capsule. Informed him would need to call Hyacinth Meeker PA for refill. He insisted on a refill, he said he had left a message at their office. Informed  him he would need to wait for them to call him back. He could not understand the reason we could not give him a refill

## 2024-02-07 NOTE — Telephone Encounter (Signed)
 Called the patient using the interpreter to advise him the prescription for Dexilant is at the pharmacy but insurance will not allow a refill until 02/27/24.  The patient did not answer the call. Left him a message that I had called.

## 2024-02-07 NOTE — Telephone Encounter (Signed)
 Patient called in today - I had a hard time understanding him as he did not have an interpreter. He was demanding that a refill be placed for him for his medication -  dexlansoprazole (DEXILANT) 60 MG capsule. I tried to advise him that our office does not refill that medication for him. He kept saying that he sent a message in requesting the refill over two weeks ago and no one has gotten back to him. Again I tried to explain that Hyacinth Meeker PA with [facility] was the prescribing doctor, and he asked to speak with her. I gave him their office phone number and advised that he would have to call them to get a refill placed. He again stated that he needed to speak with a nurse or the doctor about this medication, again gave him their office phone number.  Looks like [facility] has already handled the refill and had given the patient a call to get an appt scheduled.

## 2024-02-13 ENCOUNTER — Institutional Professional Consult (permissible substitution): Admitting: Neurology

## 2024-02-14 ENCOUNTER — Encounter: Payer: Self-pay | Admitting: Neurology

## 2024-02-14 ENCOUNTER — Ambulatory Visit (INDEPENDENT_AMBULATORY_CARE_PROVIDER_SITE_OTHER): Admitting: Neurology

## 2024-02-14 VITALS — BP 121/69 | HR 63 | Ht 66.0 in | Wt 177.2 lb

## 2024-02-14 DIAGNOSIS — Z9189 Other specified personal risk factors, not elsewhere classified: Secondary | ICD-10-CM | POA: Diagnosis not present

## 2024-02-14 DIAGNOSIS — Z8669 Personal history of other diseases of the nervous system and sense organs: Secondary | ICD-10-CM

## 2024-02-14 DIAGNOSIS — G4719 Other hypersomnia: Secondary | ICD-10-CM | POA: Diagnosis not present

## 2024-02-14 DIAGNOSIS — R0683 Snoring: Secondary | ICD-10-CM

## 2024-02-14 DIAGNOSIS — R519 Headache, unspecified: Secondary | ICD-10-CM | POA: Diagnosis not present

## 2024-02-14 DIAGNOSIS — E663 Overweight: Secondary | ICD-10-CM

## 2024-02-14 DIAGNOSIS — R351 Nocturia: Secondary | ICD-10-CM

## 2024-02-14 NOTE — Progress Notes (Signed)
 Subjective:    Patient ID: Omar Sanders is a 83 y.o. male.  HPI    Omar Fairy, MD, PhD [facility] [address] [address] [place], [place] [address]  Dear Omar Sanders,  I saw your patient, Omar Sanders, upon your kind request in my sleep clinic today for initial consultation of his sleep disorder, in particular, concern for underlying obstructive sleep apnea.  The patient is accompanied by Omar Sanders, [place] interpreter today.  As you know, Omar Sanders is an 83 year old male with an underlying complex medical history of arthritis, lower extremity edema, chronic kidney disease, diabetes, reflux disease, history of falls, hypertension, hyperlipidemia, irritable bowel syndrome, recurrent headaches, and overweight state, who reports snoring and excessive daytime somnolence.  His Epworth sleepiness score is 16 out of 24, fatigue severity score is 39 out of 63.  He reports having difficulty with falling asleep and staying asleep.  I reviewed your office note from 11/12/2023.  Years ago was reportedly diagnosed with mild obstructive sleep apnea, per chart review, but has not been on PAP therapy.  Testing was reportedly in or around 2011.  Prior test results are not available for my review today.  He reports difficulty maintaining sleep.  He has woken up with headaches.  He has nocturia about 3 times per average night.  He tries to be in bed between 9:30 PM and 10 and rise time can be early morning hours or up to 5 AM.  He does not have a TV in the bedroom, no pets in the household.  He lives with his daughter, son-in-law and his 2 sons.  He also has another daughter.  He does not drink caffeine daily.  He does not drink any alcohol.  He is a non-smoker.  He is sleepy during the day and takes a nap during the day nearly every day.  His Past Medical History Is Significant For: Past Medical History:  Diagnosis Date   Abdominal bloating 07/20/2020   Allergy    Anal fissure     Anemia    Anxiety    Arthritis    all over body    Benign prostatic hyperplasia    Bilateral lower extremity edema 03/22/2018   Cataract    removed bilat    Chest pain 02/2018   Chronic kidney disease    Chronic kidney disease, stage 3a (HCC) 01/19/2016   Chronic pain    Colon polyp    Diabetes mellitus without complication (HCC)    Falls 05/19/2021   GERD (gastroesophageal reflux disease)    Headache(784.0)    Hemorrhoids    Hepatic steatosis 05/19/2021   Hyperlipidemia    Hx: of   Hypertension    IBS (irritable bowel syndrome)    Incontinent of urine    Hx:of   Migraines    Neuromuscular disorder (HCC)    periferal neuropathy per patient   New onset right bundle branch block (RBBB)    OSA (obstructive sleep apnea) 11/2009   sleep study with mild OSA, pt unwilling to use CPAP   Seizures (HCC)    pt states he has never had a seizure    Sleep apnea    mild    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    His Past Surgical History Is Significant For: Past Surgical History:  Procedure Laterality Date   CARDIAC CATHETERIZATION  06/04/2009   mild to moderate CAD  w/o hemodynamic significant lesion  CATARACT EXTRACTION     bilat   CHOLECYSTECTOMY  2008   COLONOSCOPY  2012   Dr Loreta Ave   HEMORRHOID SURGERY     anal surgery   INGUINAL HERNIA REPAIR Bilateral 03/11/2013   Procedure: LAPAROSCOPIC BILATERAL INGUINAL HERNIA REPAIR;  Surgeon: Axel Filler, MD;  Location: [facility] OR;  Service: General;  Laterality: Bilateral;   INSERTION OF MESH Bilateral 03/11/2013   Procedure: INSERTION OF MESH;  Surgeon: Axel Filler, MD;  Location: [facility] OR;  Service: General;  Laterality: Bilateral;   NM MYOCAR PERF WALL MOTION  05/27/2009   mild iscemia mid inferio & apical regions.   NM MYOCAR PERF WALL MOTION  11/01/2012   negative for ischemia   POLYPECTOMY     TRANSURETHRAL RESECTION OF PROSTATE N/A 02/18/2021   Procedure: TRANSURETHRAL RESECTION OF THE  PROSTATE (TURP), BIPOLAR;  Surgeon: Rene Paci, MD;  Location: [facility] ORS;  Service: Urology;  Laterality: N/A;   US ECHOCARDIOGRAPHY  05/27/2009   mild mitral annular ca+,AOV mildly sclerotic    His Family History Is Significant For: Family History  Problem Relation Age of Onset   Hypertension Mother    Colon cancer Neg Hx    Colon polyps Neg Hx    Esophageal cancer Neg Hx    Rectal cancer Neg Hx    Stomach cancer Neg Hx    Migraines Neg Hx    Headache Neg Hx     His Social History Is Significant For: Social History   Socioeconomic History   Marital status: Widowed    Spouse name: Not on file   Number of children: 5   Years of education: 16   Highest education level: Bachelor's degree (e.g., BA, AB, BS)  Occupational History   Not on file  Tobacco Use   Smoking status: Never   Smokeless tobacco: Never  Vaping Use   Vaping status: Never Used  Substance and Sexual Activity   Alcohol use: No   Drug use: No   Sexual activity: Not Currently  Other Topics Concern   Not on file  Social History Narrative   Omar Sanders lives with his son and daughter with the daughter's two children ages.  Two of his daughter's children are off in college (04/2021)   Omar Sanders has two other children in [place].      Omar Sanders is by himself much of the day as his daughter works long day shifts.    Omar Sanders cooks, does laundry for family, and housework.       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 [employer] Insecurity: Not on file  Transportation Needs: Unmet Transportation Needs (04/23/2019)   PRAPARE - Administrator, Civil Service (Medical): Yes    Lack of Transportation (Non-Medical): Yes  Physical Activity: Not on file  Stress: Not on file  Social Connections: Not on file    His Allergies Are:  Allergies  Allergen Reactions   Pantoprazole Other (See Comments)    "Chest feels hot, stomach irritation"   Aspirin Other  (See Comments)    Abdominal pain    Lubiprostone Other (See Comments)    Stomach irritation    Vitamin C Other (See Comments)    Abdominal pain  :   His Current Medications Are:  Outpatient Encounter Medications as of 02/14/2024  Medication Sig   acetaminophen (TYLENOL) 500 MG tablet Take 500 mg by mouth as directed.   alfuzosin (  UROXATRAL) 10 MG 24 hr tablet Take 10 mg by mouth daily with breakfast.   atorvastatin (LIPITOR) 40 MG tablet TAKE 1 TABLET(40 MG) BY MOUTH DAILY   betamethasone dipropionate 0.05 % cream Apply topically 2 (two) times daily. Apply just outside the ear canal two times per day for 2 weeks   cetirizine (ZYRTEC) 10 MG tablet Take 1 tablet (10 mg total) by mouth daily.   dexlansoprazole (DEXILANT) 60 MG capsule Take 1 capsule (60 mg total) by mouth daily. Office visit for further refills   famotidine (PEPCID) 40 MG tablet Take 1 tablet (40 mg total) by mouth at bedtime as needed for heartburn or indigestion.   halobetasol (ULTRAVATE) 0.05 % cream APPLY TOPICALLY TO THE AFFECTED AREA DAILY   polyethylene glycol powder (GLYCOLAX/MIRALAX) 17 GM/SCOOP powder TAKE 1 SCOOP(17 GRAM) DAILY FOR CONSTIPATION   Polyvinyl Alcohol-Povidone PF (REFRESH) 1.4-0.6 % SOLN Apply 1 drop to eye daily.   torsemide (DEMADEX) 20 MG tablet Take 20 mg by mouth daily.   bisacodyl (DULCOLAX) 5 MG EC tablet Take 5 mg by mouth as directed. (Patient not taking: Reported on 02/14/2024)   Cholecalciferol (VITAMIN D3) 250 MCG (10000 UT) TABS Take by mouth. (Patient not taking: Reported on 02/14/2024)   diclofenac Sodium (VOLTAREN) 1 % GEL Apply 4 g topically 4 (four) times daily. (Patient not taking: Reported on 02/14/2024)   finasteride (PROSCAR) 5 MG tablet Take 5 mg by mouth daily. (Patient not taking: Reported on 02/14/2024)   fluticasone (FLONASE) 50 MCG/ACT nasal spray USE 1 SPRAY IN EACH NOSTRIL DAILY (Patient not taking: Reported on 02/14/2024)   lisinopril (ZESTRIL) 5 MG tablet Take 1 tablet (5 mg  total) by mouth daily.   Olopatadine HCl 0.2 % SOLN Apply 1 drop to eye daily. Apply to both eyes once daily. (Patient not taking: Reported on 02/14/2024)   Propylene Glycol (SYSTANE COMPLETE OP) Apply to eye. (Patient not taking: Reported on 02/14/2024)   tamsulosin (FLOMAX) 0.4 MG CAPS capsule Take 0.4 mg by mouth. (Patient not taking: Reported on 02/14/2024)   UNABLE TO FIND Med Name: Natural ear drops (Patient not taking: Reported on 02/14/2024)   No facility-administered encounter medications on file as of 02/14/2024.  :   Review of Systems:  Out of a complete 14 point review of systems, all are reviewed and negative with the exception of these symptoms as listed below:  Review of Systems  Neurological:        Room 5 Pt is here with Interpreter. Pt states that he sleeps more at day, than night. Pt states that he wants to sleep at night. Pt states he don't know why he stays up at night. Pt states that he has dizziness as well all of the time, but more sever when he bends down. Pt states that he has body aches. Pt states that he has numbness and burning in his toe of his right foot. Pt states that he has trouble with memory and has confusion as well.  ESS 16 FSS 39    Objective:  Neurological Exam  Physical Exam Physical Examination:   Vitals:   02/14/24 0928  BP: 121/69  Pulse: 63    General Examination: The patient is a very pleasant 83 y.o. male in no acute distress. He appears well-developed and well-nourished and well groomed.   HEENT: Normocephalic, atraumatic, pupils are equal, round and reactive to light, extraocular tracking is good without limitation to gaze excursion or nystagmus noted. Hearing is grossly intact. Face is  symmetric with normal facial animation. Speech is clear with no dysarthria noted. There is no hypophonia. There is no lip, neck/head, jaw or voice tremor. Neck is supple with full range of passive and active motion. There are no carotid bruits on  auscultation. Oropharynx exam reveals: mild mouth dryness, adequate dental hygiene with permanent partial dentures on top, moderate airway crowding secondary to small airway and redundant soft palate, uvula and tonsils not fully visualized, Mallampati class IV.  Neck circumference 15 three-quarter inches, mild overbite.  Tongue protrudes centrally and visible palate elevates symmetrically.   Chest: Clear to auscultation without wheezing, rhonchi or crackles noted.  Heart: S1+S2+0, regular and normal without murmurs, rubs or gallops noted.   Abdomen: Soft, non-tender and non-distended.  Extremities: There is trace pitting edema in the distal lower extremities bilaterally, right more than left.   Skin: Warm and dry without trophic changes noted.   Musculoskeletal: exam reveals no obvious joint deformities.   Neurologically:  Mental status: The patient is awake, alert and oriented in all 4 spheres. His immediate and remote memory, attention, language skills and fund of knowledge are appropriate. There is no evidence of aphasia, agnosia, apraxia or anomia. Speech is clear with normal prosody and enunciation. Thought process is linear. Mood is normal and affect is normal.  Cranial nerves II - XII are as described above under HEENT exam.  Motor exam: Normal bulk, strength and tone is noted. There is no obvious action or resting tremor.  Fine motor skills and coordination: grossly intact.  Cerebellar testing: No dysmetria or intention tremor. There is no truncal or gait ataxia.  Sensory exam: intact to light touch in the upper and lower extremities.  Gait, station and balance: He stands easily. No veering to one side is noted. No leaning to one side is noted. Posture is age-appropriate and stance is narrow based. Gait shows normal stride length and normal pace. No problems turning are noted.   Assessment and Plan:  In summary, Omar Sanders is a very pleasant 83 y.o.-year old male with an underlying  complex medical history of arthritis, lower extremity edema, chronic kidney disease, diabetes, reflux disease, history of falls, hypertension, hyperlipidemia, irritable bowel syndrome, recurrent headaches, and overweight state, whose history and physical exam are concerning for sleep disordered breathing, particularly obstructive sleep apnea (OSA). A laboratory attended sleep study is typically considered "gold standard" for evaluation of sleep disordered breathing.   I had a long chat with the patient about my findings and the diagnosis of sleep apnea, particularly OSA, its prognosis and treatment options. We talked about medical/conservative treatments, surgical interventions and non-pharmacological approaches for symptom control. I explained, in particular, the risks and ramifications of untreated moderate to severe OSA, especially with respect to developing cardiovascular disease down the road, including congestive heart failure (CHF), difficult to treat hypertension, cardiac arrhythmias (particularly A-fib), neurovascular complications including TIA, stroke and dementia. Even type 2 diabetes has, in part, been linked to untreated OSA. Symptoms of untreated OSA may include (but may not be limited to) daytime sleepiness, nocturia (i.e. frequent nighttime urination), memory problems, mood irritability and suboptimally controlled or worsening mood disorder such as depression and/or anxiety, lack of energy, lack of motivation, physical discomfort, as well as recurrent headaches, especially morning or nocturnal headaches. We talked about the importance of maintaining a healthy lifestyle and striving for healthy weight. In addition, we talked about the importance of striving for and maintaining good sleep hygiene. I recommended a sleep study at this  time. I outlined the differences between a laboratory attended sleep study which is considered more comprehensive and accurate over the option of a home sleep test  (HST); the latter may lead to underestimation of sleep disordered breathing in some instances and does not help with diagnosing upper airway resistance syndrome and is not accurate enough to diagnose primary central sleep apnea typically. I outlined possible surgical and non-surgical treatment options of OSA, including the use of a positive airway pressure (PAP) device (i.e. CPAP, AutoPAP/APAP or BiPAP in certain circumstances), a custom-made dental device (aka oral appliance, which would require a referral to a specialist dentist or orthodontist typically, and is generally speaking not considered for patients with full dentures or edentulous state), upper airway surgical options, such as traditional UPPP (which is not considered a first-line treatment) or the Inspire device (hypoglossal nerve stimulator, which would involve a referral for consultation with an ENT surgeon, after careful selection, following inclusion criteria - also not first-line treatment). I explained the PAP treatment option to the patient in detail, as this is generally considered first-line treatment.  The patient indicated that he would be willing to try PAP therapy, if the need arises. I explained the importance of being compliant with PAP treatment, not only for insurance purposes but primarily to improve patient's symptoms symptoms, and for the patient's long term health benefit, including to reduce His cardiovascular risks longer-term.  I was able to show him a model for a CPAP/AutoPap machine today. We will pick up our discussion about the next steps and treatment options after testing.  We will keep him posted as to the test results by phone call and/or MyChart messaging where possible.  We will plan to follow-up in sleep clinic accordingly as well.  I answered all his questions today and the patient was in agreement.  Of note, the patient does not drive.  He took transportation today to get to this appointment. I encouraged him to  call with any interim questions, concerns, problems or updates or email us  through MyChart.  Generally speaking, sleep test authorizations may take up to 2 weeks, sometimes less, sometimes longer, the patient is encouraged to get in touch with us  if they do not hear back from the sleep lab staff directly within the next 2 weeks.  Thank you very much for allowing me to participate in the care of this nice patient. If I can be of any further assistance to you please do not hesitate to talk to me.   Sincerely,   Omar Fairy, MD, PhD

## 2024-02-14 NOTE — Patient Instructions (Signed)

## 2024-02-21 ENCOUNTER — Telehealth: Payer: Self-pay | Admitting: Pediatric Intensive Care

## 2024-02-21 ENCOUNTER — Ambulatory Visit: Admitting: Student

## 2024-02-21 VITALS — BP 105/58 | HR 58 | Ht 66.0 in | Wt 177.8 lb

## 2024-02-21 DIAGNOSIS — E1122 Type 2 diabetes mellitus with diabetic chronic kidney disease: Secondary | ICD-10-CM

## 2024-02-21 DIAGNOSIS — K029 Dental caries, unspecified: Secondary | ICD-10-CM

## 2024-02-21 LAB — POCT GLYCOSYLATED HEMOGLOBIN (HGB A1C): HbA1c, POC (controlled diabetic range): 6.7 % (ref 0.0–7.0)

## 2024-02-21 MED ORDER — PENICILLIN V POTASSIUM 500 MG PO TABS: 500.0000 mg | ORAL_TABLET | Freq: Two times a day (BID) | ORAL | 0 refills | Status: AC

## 2024-02-21 NOTE — Telephone Encounter (Signed)
 Patient had called back to nurse line regarding this concern.   Do you happen to have the address and phone number of dental office?   Elsie Halo, RN

## 2024-02-21 NOTE — Assessment & Plan Note (Signed)
 A1c 6.7.  Well-controlled at this time with diet.

## 2024-02-21 NOTE — Telephone Encounter (Signed)
 Call to client's last primary dental provider- [facility]. Gave them description of current dental issue. Appointment made for client for 03/05/24 at 1400. Office will provide interpretation services. Armand Berliner BSN RN CCNP [phone]

## 2024-02-21 NOTE — Progress Notes (Signed)
  SUBJECTIVE:   CHIEF COMPLAINT / HPI:   Dental pain: Presents with a toothache that has been ongoing for over a year but specifically has bothered him since Sunday. The pain, located on the right side of the face, radiates from the tooth to the head. The pain is triggered by both hot and cold food or drink, but can also occur spontaneously. He has not seen a dentist since last year. He was previously given a sensitive gum toothpaste by a dentist, but it caused a burning sensation in the mouth and on the tongue. The patient is seeking a referral to a dentist who can extract the problematic tooth, as the previous dentist [employer] [facility]) only offered to repair it.  [place] interpreter utilized throughout Audiological scientist.  PERTINENT  PMH / PSH: HTN, HLD, T2DM, BPH, aortic aneurysm, hearing difficulty, CKD 4  OBJECTIVE:  BP (!) 105/58   Pulse (!) 58   Ht 5\' 6"  (1.676 m)   Wt 177 lb 12.8 oz (80.6 kg)   SpO2 99%   BMI 28.70 kg/m  General: Well-appearing, NAD HEENT: Significant dental caries throughout in particular in the right lower portion of molars without evidence of abscess; normal right TM  ASSESSMENT/PLAN:   Assessment & Plan Pain due to dental caries Dental caries, no abscess.  Rx for penicillin  VK 500 mg twice daily x 5 days.  Messages sent to staff regarding assistance of dental referral for purposes of extraction.  Recommend Sensodyne toothpaste rather than his current toothpaste he is using. Type 2 diabetes mellitus with chronic kidney disease, without long-term current use of insulin , unspecified CKD stage (HCC) A1c 6.7.  Well-controlled at this time with diet. Return if symptoms worsen or fail to improve. Veronia Goon, DO 02/21/2024, 10:53 AM PGY-3, [facility]

## 2024-02-21 NOTE — Patient Instructions (Addendum)
 It was great to see you today! Thank you for choosing [facility] for your primary care.  Today we addressed: Will reach out to our referral people to see if they have any ability to get you referred to a dentist for an extraction.  In the meantime, I am giving you 5 days worth of antibiotics.  Do not use that toothpaste anymore.  Try Sensodyne as that may be less sensitive for you. Your A1c was 6.7 which is well-controlled.  Hm nay chng ti ? ?? c?p ??n: 1. S? lin h? v?i nh?ng ng??i gi?i thi?u c?a chng ti ?? xem li?u h? c kh? n?ng gi?i thi?u b?n ??n nha s? ?? nh? r?ng hay khng.  Trong lc ch? ??i, ti s? cho b?n thu?c khng sinh ?? dng trong 5 ngy.  ??ng dng kem ?nh r?ng ? n?a.  Hy th? Sensodyne v n c th? t nh?y c?m h?n v?i b?n. 2. A1c c?a b?n l 6,7, ???c ki?m sot t?t.  If you haven't already, sign up for My Chart to have easy access to your labs results, and communication with your primary care physician.  Return if symptoms worsen or fail to improve. Please arrive 15 minutes before your appointment to ensure smooth check in process.  We appreciate your efforts in making this happen.  Thank you for allowing me to participate in your care, Veronia Goon, DO 02/21/2024, 10:43 AM PGY-3, [facility]

## 2024-02-28 NOTE — Telephone Encounter (Signed)
 Attempted to call patient to provide updated office information and appointment details.   He did not answer, no ability to LVM.   If patient calls back, please advise of previous message information.   Elsie Halo, RN

## 2024-03-03 ENCOUNTER — Telehealth: Payer: Self-pay | Admitting: Neurology

## 2024-03-03 DIAGNOSIS — R3911 Hesitancy of micturition: Secondary | ICD-10-CM | POA: Diagnosis not present

## 2024-03-03 DIAGNOSIS — R3915 Urgency of urination: Secondary | ICD-10-CM | POA: Diagnosis not present

## 2024-03-03 NOTE — Telephone Encounter (Signed)
 I spoke with the patient.   [employer] medicare/medicaid no auth req   He is scheduled at [facility] for 04/01/24 at 8 pm.  Mailed packet to the patient,

## 2024-03-04 ENCOUNTER — Encounter: Payer: Self-pay | Admitting: Student

## 2024-03-04 ENCOUNTER — Ambulatory Visit (INDEPENDENT_AMBULATORY_CARE_PROVIDER_SITE_OTHER): Admitting: Student

## 2024-03-04 VITALS — BP 111/63 | HR 65 | Ht 66.0 in | Wt 177.8 lb

## 2024-03-04 DIAGNOSIS — K219 Gastro-esophageal reflux disease without esophagitis: Secondary | ICD-10-CM | POA: Diagnosis not present

## 2024-03-04 MED ORDER — POLYETHYLENE GLYCOL 3350 17 GM/SCOOP PO POWD: ORAL | 0 refills | Status: AC

## 2024-03-04 NOTE — Patient Instructions (Addendum)
 It was great to see you! Thank you for allowing me to participate in your care!  I recommend that you always bring your medications to each appointment as this makes it easy to ensure you are on the correct medications and helps us  not miss when refills are needed.  Our plans for today:   Call to schedule apt with the stomach doctor: [facility] [address] [place] [address] [place],  [place]  [address] [phone]  For constipation, try I capful of miralax  daily. Can increase to 1 capful twice daily if needed. It can take 3 days to work   Take care and seek immediate care sooner if you develop any concerns.   Dr. Glenn Lange, DO [facility]

## 2024-03-04 NOTE — Progress Notes (Unsigned)
    SUBJECTIVE:   CHIEF COMPLAINT / HPI:   Omar Sanders is an 83 year old male with hypertension, diabetes, and hyperlipidemia who presents with concerns about medication side effects and gastrointestinal symptoms.  He experiences persistent  bloating and acid reflux for years. He takes Dexilant  16 mg daily and famotidine  40 mg nightly, as previous treatments with omeprazole  and pantoprazole  were ineffective. He has an upcoming gastroenterology appointment.  He requires a "gentle laxative" for daily bowel movements, as MiraLAX  was ineffective in the past though I am not sure what the gentle laxitive is. He is having daily Bms. He denies nausea or vomiting but reports ongoing bloating and belching. He maintains a regular eating pattern.  PERTINENT  PMH / PSH: HTN, T2DM, HLD, GERD  OBJECTIVE:   BP 111/63   Pulse 65   Ht 5\' 6"  (1.676 m)   Wt 177 lb 12.8 oz (80.6 kg)   SpO2 95%   BMI 28.70 kg/m    General: NAD, pleasant, able to participate in exam Cardiac: Well perfused Respiratory: normal effort Abdomen: Bowel sounds present, nontender, nondistended, soft Skin: warm and dry Neuro: alert, no obvious focal deficits Psych: Normal affect and mood  ASSESSMENT/PLAN:   No problem-specific Assessment & Plan notes found for this encounter.     Dr. Glenn Lange, DO [facility]    {    This will disappear when note is signed, click to select method of visit    :1}

## 2024-03-05 NOTE — Assessment & Plan Note (Addendum)
 Chronic bloating, belching, and acid reflux. This is not a new problem, abdominal exam reassuring. Evaluated by gastroenterology about a year ago with suspicion of gastritis and SIBO with plans to f/u in ~3 months which was never done. On Dexilant  and famotidine  per last GI note. GI follow-up scheduled for further evaluation, including possible EGD. - Continue Dexilant  60 mg daily. - Continue famotidine  40 mg nightly. - Follow up with gastroenterology on April 14, 2024.

## 2024-03-06 ENCOUNTER — Ambulatory Visit (INDEPENDENT_AMBULATORY_CARE_PROVIDER_SITE_OTHER): Admitting: Otolaryngology

## 2024-03-06 ENCOUNTER — Ambulatory Visit (INDEPENDENT_AMBULATORY_CARE_PROVIDER_SITE_OTHER): Admitting: Audiology

## 2024-03-06 ENCOUNTER — Encounter (INDEPENDENT_AMBULATORY_CARE_PROVIDER_SITE_OTHER): Payer: Self-pay | Admitting: Otolaryngology

## 2024-03-06 VITALS — BP 127/69 | HR 66 | Ht 66.0 in | Wt 170.0 lb

## 2024-03-06 DIAGNOSIS — H608X3 Other otitis externa, bilateral: Secondary | ICD-10-CM | POA: Diagnosis not present

## 2024-03-06 DIAGNOSIS — R2689 Other abnormalities of gait and mobility: Secondary | ICD-10-CM

## 2024-03-06 DIAGNOSIS — H903 Sensorineural hearing loss, bilateral: Secondary | ICD-10-CM | POA: Diagnosis not present

## 2024-03-06 MED ORDER — FLUOCINOLONE ACETONIDE 0.01 % OT OIL: 3.0000 [drp] | TOPICAL_OIL | OTIC | 5 refills | Status: AC

## 2024-03-06 NOTE — Patient Instructions (Signed)
 Stop ear drops you are using now. I have given you a new ear drop - Dermotic oil -- use it two times per week; you can use the ointment that you have in the ear one time per week now.

## 2024-03-06 NOTE — Progress Notes (Signed)
 Dear Dr. Rochelle Chu, Here is my assessment for our mutual patient, Omar Sanders. Thank you for allowing me the opportunity to care for your patient. Please do not hesitate to contact me should you have any other questions. Sincerely, Dr. Milon Aloe  Otolaryngology Clinic Note Referring provider: Dr. Rochelle Chu HPI:  Omar Sanders is a 83 y.o. male kindly referred by Dr. Rochelle Chu for evaluation of hearing loss  Initial visit (01/2024): Patient reports: chronic bilateral itching and hearing loss; ongoing for two years. He reports that he has not tried any drops for the ears. He also reports some bilateral hearing loss, gradual in onset, which has been ongoing for over 6-7 years, but worse on the right. Reports it is the same. He does report bilateral tinnitus as well, not pulsatile. Does use qtips. Prior evaluated by [facility] (Dr. Ralston Burkes) Patient denies: ear pain, fullness, vertigo, drainage Patient additionally denies: deep pain in ear canal, eustachian tube symptoms such as popping/crackling, sensitive to pressure changes Patient also denies barotrauma, vestibular suppressant use, ototoxic medication use Prior ear surgery: no  He also also seen neurology for lightheadedness but denies vertigo.   --------------------------------------------------------- 03/06/2024 Returns for follow up. He reports that his itching and discomfort is much better; using betamethasone  and drops. No pain, fullness, vertigo, drainage. Hearing loss stable. He returns with audio  In person interpreter used.   H&N Surgery: no Personal or FHx of bleeding dz or anesthesia difficulty: no  Tobacco: no  PMHx: Arthritis, CKD, DM, HTN, Headaches, RBBB  Independent Review of Additional Tests or Records:  Dr. Ralston Burkes 02/23/2023: Noted bilateral hearing loss, and dizziness; noted headache as well; he also reports itching both ears; prior HT noted; Dx: SNHL, Headache, dizziness; Rx: steroid ear drops, ref to neuro Dr. Salli Crawley (11/12/2023):  headaches and dizziness; worse stress, vertex headaches; Dx: Chronic headaches; Rx: MRI Brain Dr. Rochelle Chu (11/27/2023): noted hearing problems, rec HA but [facility] not covered, needs [facility]; Rx: ref to ENT Audiogram reviewed (01/2023):  Bert Britain (11/02/2022) AuD: decreased hearing; independently reviewed, noted essentially SNHL in both ears, asymmetric with right > left; WRT not completed  MRI Brain 01/11/2024 independently interpreted with respect to ears: no mastoid or ME effusion noted; no IAC cuts so suboptimal study but no retrocochlear lesion noted 02/2024 Audiogram was independently reviewed and interpreted by me and it reveals - b/l symmetric SNHL moderate to severe, downsloping; SRT 50 and 45dB AD/AS; WRT CNT; A/As tymps   SNHL= Sensorineural hearing loss  PMH/Meds/All/SocHx/FamHx/ROS:   Past Medical History:  Diagnosis Date   Abdominal bloating 07/20/2020   Allergy    Anal fissure    Anemia    Anxiety    Arthritis    all over body    Benign prostatic hyperplasia    Bilateral lower extremity edema 03/22/2018   Cataract    removed bilat    Chest pain 02/2018   Chronic kidney disease    Chronic kidney disease, stage 3a (HCC) 01/19/2016   Chronic pain    Colon polyp    Diabetes mellitus without complication (HCC)    Falls 05/19/2021   GERD (gastroesophageal reflux disease)    Headache(784.0)    Hemorrhoids    Hepatic steatosis 05/19/2021   Hyperlipidemia    Hx: of   Hypertension    IBS (irritable bowel syndrome)    Incontinent of urine    Hx:of   Migraines    Neuromuscular disorder (HCC)    periferal neuropathy per patient   New onset  right bundle branch block (RBBB)    OSA (obstructive sleep apnea) 11/2009   sleep study with mild OSA, pt unwilling to use CPAP   Seizures (HCC)    pt states he has never had a seizure    Sleep apnea    mild    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     Past Surgical History:  Procedure  Laterality Date   CARDIAC CATHETERIZATION  06/04/2009   mild to moderate CAD  w/o hemodynamic significant lesion   CATARACT EXTRACTION     bilat   CHOLECYSTECTOMY  2008   COLONOSCOPY  2012   Dr Tova Fresh   HEMORRHOID SURGERY     anal surgery   INGUINAL HERNIA REPAIR Bilateral 03/11/2013   Procedure: LAPAROSCOPIC BILATERAL INGUINAL HERNIA REPAIR;  Surgeon: Shela Derby, MD;  Location: [facility] OR;  Service: General;  Laterality: Bilateral;   INSERTION OF MESH Bilateral 03/11/2013   Procedure: INSERTION OF MESH;  Surgeon: Shela Derby, MD;  Location: [facility] OR;  Service: General;  Laterality: Bilateral;   NM MYOCAR PERF WALL MOTION  05/27/2009   mild iscemia mid inferio & apical regions.   NM MYOCAR PERF WALL MOTION  11/01/2012   negative for ischemia   POLYPECTOMY     TRANSURETHRAL RESECTION OF PROSTATE N/A 02/18/2021   Procedure: TRANSURETHRAL RESECTION OF THE PROSTATE (TURP), BIPOLAR;  Surgeon: Adelbert Homans, MD;  Location: [facility];  Service: Urology;  Laterality: N/A;   US  ECHOCARDIOGRAPHY  05/27/2009   mild mitral annular ca+,AOV mildly sclerotic    Family History  Problem Relation Age of Onset   Hypertension Mother    Colon cancer Neg Hx    Colon polyps Neg Hx    Esophageal cancer Neg Hx    Rectal cancer Neg Hx    Stomach cancer Neg Hx    Migraines Neg Hx    Headache Neg Hx      Social Connections: Not on file      Current Outpatient Medications:    acetaminophen  (TYLENOL ) 500 MG tablet, Take 500 mg by mouth as directed., Disp: , Rfl:    alfuzosin (UROXATRAL) 10 MG 24 hr tablet, Take 10 mg by mouth daily with breakfast., Disp: , Rfl:    atorvastatin  (LIPITOR) 40 MG tablet, TAKE 1 TABLET(40 MG) BY MOUTH DAILY, Disp: 90 tablet, Rfl: 3   betamethasone  dipropionate 0.05 % cream, Apply topically 2 (two) times daily. Apply just outside the ear canal two times per day for 2 weeks, Disp: 30 g, Rfl: 0   bisacodyl  (DULCOLAX) 5 MG EC tablet, Take 5 mg by mouth as directed., Disp: ,  Rfl:    cetirizine  (ZYRTEC ) 10 MG tablet, Take 1 tablet (10 mg total) by mouth daily., Disp: 30 tablet, Rfl: 2   Cholecalciferol  (VITAMIN D3) 250 MCG (10000 UT) TABS, Take by mouth., Disp: , Rfl:    dexlansoprazole  (DEXILANT ) 60 MG capsule, Take 1 capsule (60 mg total) by mouth daily. Office visit for further refills, Disp: 30 capsule, Rfl: 2   diclofenac  Sodium (VOLTAREN ) 1 % GEL, Apply 4 g topically 4 (four) times daily., Disp: 350 g, Rfl: 2   famotidine  (PEPCID ) 40 MG tablet, Take 1 tablet (40 mg total) by mouth at bedtime as needed for heartburn or indigestion., Disp: 30 tablet, Rfl: 11   finasteride  (PROSCAR ) 5 MG tablet, Take 5 mg by mouth daily., Disp: , Rfl:    Fluocinolone Acetonide (DERMOTIC) 0.01 % OIL, Place 3 drops in  ear(s) 2 (two) times a week., Disp: 20 mL, Rfl: 5   fluticasone  (FLONASE ) 50 MCG/ACT nasal spray, USE 1 SPRAY IN EACH NOSTRIL DAILY, Disp: 16 g, Rfl: 1   halobetasol  (ULTRAVATE ) 0.05 % cream, APPLY TOPICALLY TO THE AFFECTED AREA DAILY, Disp: 50 g, Rfl: 2   ofloxacin  (FLOXIN ) 0.3 % OTIC solution, SMARTSIG:In Ear(s), Disp: , Rfl:    Olopatadine  HCl 0.2 % SOLN, Apply 1 drop to eye daily. Apply to both eyes once daily., Disp: 2.5 mL, Rfl: 0   polyethylene glycol powder (GLYCOLAX /MIRALAX ) 17 GM/SCOOP powder, TAKE 1 SCOOP(17 GRAM) DAILY FOR CONSTIPATION, Disp: 510 g, Rfl: 0   Polyvinyl Alcohol -Povidone PF (REFRESH) 1.4-0.6 % SOLN, Apply 1 drop to eye daily., Disp: 50 each, Rfl: 1   Propylene Glycol (SYSTANE COMPLETE OP), Apply to eye., Disp: , Rfl:    tamsulosin  (FLOMAX ) 0.4 MG CAPS capsule, Take 0.4 mg by mouth., Disp: , Rfl:    torsemide  (DEMADEX ) 20 MG tablet, Take 20 mg by mouth daily., Disp: , Rfl:    UNABLE TO FIND, Med Name: Natural ear drops, Disp: , Rfl:    lisinopril  (ZESTRIL ) 5 MG tablet, Take 1 tablet (5 mg total) by mouth daily., Disp: 90 tablet, Rfl: 3   Physical Exam:   BP 127/69 (BP Location: Right Arm, Patient Position: Sitting, Cuff Size: Large)   Pulse 66    Ht 5\' 6"  (1.676 m)   Wt 170 lb (77.1 kg)   SpO2 95%   BMI 27.44 kg/m   Salient findings:  CN II-XII intact Given history and complaints, ear microscopy was indicated and performed for evaluation with findings as below in physical exam section and in procedures;  Bilateral EAC clear and TM intact with aerated middle ear it appears; bilateral eczematoid changes in EAC much improved [address]: mid - but inconsistent response; language barrier made it difficult [address]: AC > BC b/l  Anterior rhinoscopy: Septum relatively midline; bilateral inferior turbinates without significant hypertrophy  Seprately Identifiable Procedures:  Procedure: Bilateral ear microscopy using microscope (CPT 92504) Pre-procedure diagnosis: hearing loss, eczematoid otitis externa Post-procedure diagnosis: same Indication: see above; given patient's otologic complaints and history, for improved and comprehensive examination of external ear and tympanic membrane, bilateral otologic examination using microscope was performed  Procedure: Patient was placed semi-recumbent. Both ear canals were examined using the microscope with findings above. Patient tolerated the procedure well.  Impression & Plans:  Omar Sanders is a 83 y.o. male with:  1. Sensorineural hearing loss (SNHL) of both ears   2. Imbalance   3. Chronic eczematous otitis externa of both ears    Noted prior b/l essentially SNHL with type B tymps (unclear exactly what caused this). Updated audio shows A/As tymps (perhaps allergies or temporarily ETD?). He doesn't have h/o significantly frequent infections and exam again overall reassuring.  He also does have itching b/l and likely due to qtip use and eczematoid changes and this is much better - As such, will reduce betamethasone  ointment BID once weekly; if worsening, can use consistently - Stop ofloxacin  drops; start dermotic ear drops once or twice weekly 2 drops - stop qtip use  Do think  amplification will help; he will contact his insurance company and let us  know where he'd like to get his HA and we can refer him there  From imbalance standpoint, he's seen neuro and cardiac but he denies any frank vertigo or other sx suggestive of otologic etiology; did offer him formal vestib testing but declined;  will observe  F/u discussed with pt; he opted PRN - certainly if sx persist or other otologic sx develop  See below regarding exact medications prescribed this encounter including dosages and route: Meds ordered this encounter  Medications   Fluocinolone Acetonide (DERMOTIC) 0.01 % OIL    Sig: Place 3 drops in ear(s) 2 (two) times a week.    Dispense:  20 mL    Refill:  5      Thank you for allowing me the opportunity to care for your patient. Please do not hesitate to contact me should you have any other questions.  Sincerely, Milon Aloe, MD Otolaryngologist (ENT), [facility] Phone: [phone] Fax: [phone]  03/06/2024, 1:15 PM   I have personally spent 31 minutes involved in face-to-face and non-face-to-face activities for this patient on the day of the visit.  Professional time spent excludes any procedures performed but includes the following activities, in addition to those noted in the documentation: preparing to see the patient (review of outside documentation and results), performing a medically appropriate examination, counseling, ordering medications (fluocinolone), documenting in the electronic health record, interpreting results (HT)

## 2024-03-10 NOTE — Progress Notes (Signed)
  [address], [address] [place], [place] [address] [phone]  Audiological Evaluation    Name: Omar Sanders     DOB:   Apr 09, 1941      MRN:   [id number]                                                                                     Service Date: 03/06/2024     Accompanied by: [place] in person interpreter   Patient comes today after Dr. Lydia Sams, ENT sent a referral for a hearing evaluation due to concerns with hearing loss.   Symptoms Yes Details  Hearing loss  [x]  Reports hearing loss in both ears.  Tinnitus  [x]  Reports he perceives it coming from his head  Ear pain/ infections/pressure  [x]  Reports right ear pain /itchiness   Balance problems  []    Noise exposure history  []    Previous ear surgeries  []    Family history of hearing loss  []    Amplification  []    Other  []      Otoscopy: Right ear: Clear external ear canals and notable landmarks visualized on the tympanic membrane. Left ear:  Clear external ear canals and notable landmarks visualized on the tympanic membrane.  Tympanometry: Right ear: Type A- Normal external ear canal volume with normal middle ear pressure and tympanic membrane compliance. Of note- a very broad gradient was noticed. Left ear: Type As- Normal external ear canal volume with normal middle ear pressure and low tympanic membrane compliance.   Pure tone Audiometry: Right ear- Moderate to profound sensorineural hearing loss from 125 Hz - 8000 Hz. Left ear-  Moderate to severe sensorineural hearing loss from 125 Hz - 8000 Hz.  Speech Audiometry: (used a closed set) Right ear- Speech Reception Threshold (SRT) was obtained at 50 dBHL. Left ear-Speech Reception Threshold (SRT) was obtained at 45 dBHL.   Word Recognition Score  Could not test due to language barrier.   The hearing test results were completed under headphones and results are deemed to be of good reliability for the data obtained. Test technique:  conventional       Recommendations: Follow up with ENT as scheduled for today. Return for a hearing evaluation if concerns with hearing changes arise or per MD recommendation. Consider a communication needs assessment after medical clearance for hearing aids is obtained with at least a hearing spot check.   Leeana Creer MARIE LEROUX-MARTINEZ, AUD

## 2024-03-14 ENCOUNTER — Encounter: Payer: Self-pay | Admitting: Cardiovascular Disease

## 2024-03-14 NOTE — Telephone Encounter (Signed)
 Error

## 2024-03-17 ENCOUNTER — Encounter: Payer: Self-pay | Admitting: Audiology

## 2024-03-31 ENCOUNTER — Encounter

## 2024-04-01 ENCOUNTER — Ambulatory Visit (INDEPENDENT_AMBULATORY_CARE_PROVIDER_SITE_OTHER): Admitting: Neurology

## 2024-04-01 DIAGNOSIS — R351 Nocturia: Secondary | ICD-10-CM

## 2024-04-01 DIAGNOSIS — E663 Overweight: Secondary | ICD-10-CM

## 2024-04-01 DIAGNOSIS — Z9189 Other specified personal risk factors, not elsewhere classified: Secondary | ICD-10-CM

## 2024-04-01 DIAGNOSIS — G4733 Obstructive sleep apnea (adult) (pediatric): Secondary | ICD-10-CM

## 2024-04-01 DIAGNOSIS — Z8669 Personal history of other diseases of the nervous system and sense organs: Secondary | ICD-10-CM

## 2024-04-01 DIAGNOSIS — R519 Headache, unspecified: Secondary | ICD-10-CM

## 2024-04-01 DIAGNOSIS — R0683 Snoring: Secondary | ICD-10-CM

## 2024-04-01 DIAGNOSIS — G4719 Other hypersomnia: Secondary | ICD-10-CM

## 2024-04-01 DIAGNOSIS — G4720 Circadian rhythm sleep disorder, unspecified type: Secondary | ICD-10-CM

## 2024-04-03 DIAGNOSIS — H905 Unspecified sensorineural hearing loss: Secondary | ICD-10-CM | POA: Diagnosis not present

## 2024-04-14 ENCOUNTER — Ambulatory Visit (INDEPENDENT_AMBULATORY_CARE_PROVIDER_SITE_OTHER): Admitting: Physician Assistant

## 2024-04-14 ENCOUNTER — Encounter: Payer: Self-pay | Admitting: Physician Assistant

## 2024-04-14 VITALS — BP 102/58 | HR 69 | Ht 65.0 in | Wt 176.1 lb

## 2024-04-14 DIAGNOSIS — R140 Abdominal distension (gaseous): Secondary | ICD-10-CM

## 2024-04-14 DIAGNOSIS — K219 Gastro-esophageal reflux disease without esophagitis: Secondary | ICD-10-CM | POA: Diagnosis not present

## 2024-04-14 DIAGNOSIS — K5909 Other constipation: Secondary | ICD-10-CM | POA: Diagnosis not present

## 2024-04-14 MED ORDER — FAMOTIDINE 40 MG PO TABS
40.0000 mg | ORAL_TABLET | Freq: Two times a day (BID) | ORAL | 11 refills | Status: AC
Start: 2024-04-14 — End: ?

## 2024-04-14 MED ORDER — DEXLANSOPRAZOLE 60 MG PO CPDR
60.0000 mg | DELAYED_RELEASE_CAPSULE | Freq: Every day | ORAL | 3 refills | Status: AC
Start: 2024-04-14 — End: ?

## 2024-04-14 MED ORDER — LUBIPROSTONE 24 MCG PO CAPS: 24.0000 ug | ORAL_CAPSULE | Freq: Two times a day (BID) | ORAL | 11 refills | Status: DC

## 2024-04-14 NOTE — Progress Notes (Signed)
 Noted

## 2024-04-14 NOTE — Patient Instructions (Addendum)
 We have sent the following medications to your pharmacy for you to pick up at your convenience: Famotidine  40 mg, take 1 tablet twice a day.  Amitiza  24 mcg, take 1 tablet twice day with meals.  Dexilant  60mg  once day.  Follow up in 3 months.  You have been given a testing kit to check for small intestine bacterial overgrowth (SIBO) which is completed by a company named [facility]. Make sure to return your test in the mail using the return mailing label given to you along with the kit. The test order, your demographic and insurance information have all already been sent to the company. [facility] will collect an upfront charge of $109.00 for commercial insurance plans and $229.00 if you are paying cash. The potential remaining total after claim submission and review is $120.00. Make sure to discuss with [facility] PRIOR to having the test to see if they have gotten information from your insurance company as to how much your testing will cost out of pocket, if any. Please contact [facility] at phone number [phone] to get instructions regarding how to perform the test as our office is unable to give specific testing instructions.    Thank you for trusting me with your gastrointestinal care!  Reginal Capra, PA-C  _______________________________________________________  If your blood pressure at your visit was 140/90 or greater, please contact your primary care physician to follow up on this.  _______________________________________________________  If you are age 66 or older, your body mass index should be between 23-30. Your Body mass index is 29.31 kg/m. If this is out of the aforementioned range listed, please consider follow up with your Primary Care Provider.  If you are age 41 or younger, your body mass index should be between 19-25. Your Body mass index is 29.31 kg/m. If this is out of the aformentioned range listed, please consider follow up with your  Primary Care Provider.   ________________________________________________________  The [facility] providers would like to encourage you to use MYCHART to communicate with providers for non-urgent requests or questions.  Due to long hold times on the telephone, sending your provider a message by [facility] may be a faster and more efficient way to get a response.  Please allow 48 business hours for a response.  Please remember that this is for non-urgent requests.  _______________________________________________________

## 2024-04-14 NOTE — Progress Notes (Signed)
 Chief Complaint: Chronic bloating, constipation and GERD  HPI:    Mr. Vise is an 83 year old [place] male, known to Dr. Elvin Hammer, with a past medical history as listed below including anxiety, abdominal bloating, GERD, constipation, CKD and multiple others, who presents to clinic today with interpreter for a complaint of chronic bloating, constipation and GERD.     2018 EGD with mild grade a esophagitis otherwise negative.    04/2020 colonoscopy due to a history of adenomatous polyps with 1 mm polyp removed which was a tubular adenoma.  No follow-up recommended due to age.    12/30/2020 patient seen in clinic by Alfonza Iles, PA-C.  At that time discussed the patient had been given MiraLAX  in the past for constipation but at the time of last visit he notes that he did not want to use that because he read he should not think chronic kidney disease.  He had been given a trial of Linzess  which had increased his heartburn.  This was discontinued given a trial of Amitiza  24 mcg twice a day.  He stopped this because he is not convinced it was working and has been using Senokot but he is requiring increasing dosage 2 to 3 tablets a day to produce a bowel movement.  At that time wanted to try Cabometyx again.  Describes history of being given several PPIs in the past and not staying on any of them because became convinced that they were not helping.  He had symptoms of bloating, gas, belching and reflux with intermittent cough.  At that time started patient on Nexium  40 mg every morning and antireflux regimen.  For his chronic bloating recommended avoidance of dairy and artificial sweeteners as well as carbonates.  Discussed breath testing for SIBO but patient wanted to try an empiric course of Xifaxan  550 p.o. 3 times daily x14 days.  Also restarted Amitiza  24 mcg p.o. twice a day.  Explained that if patient had no improvement then would consider further imaging/possibly repeat EGD with Dr. Elvin Hammer.    06/24/2021  patient seen in clinic by me and at that time described continued symptoms as above with bloating, belching and reflux.  At that time was using Nexium  40 mg once daily with no change.  He was scheduled for an EGD.  Patient never had EGD.    01/15/2023 patient seen in clinic by me taking Omeprazole  40 twice a day but still had issues with bloating gas epigastric pain and GERD.  At that time trial Dexilant  60 and Famotidine  40 mg nightly and discontinue Omeprazole .    Today, patient presents to clinic accompanied by interpreter and explains that he still has similar symptoms.  His history is hard to garner.  I am not really sure what medicines he actually tried that we put him on or not.  He describes constipation with a bowel movement maybe once every few days regardless of Dulcolax, remembers trying MiraLAX  which did not help and does not recall trying anything else.  Also with bloating.  Does not recall being tested for SIBO or being treated for SIBO or if this helped at all.    Also continues with reflux symptoms describing a regurgitation regardless of Dexilant  60 mg daily and Famotidine  40 mg at night.    Denies fever, chills or weight loss.   Past Medical History:  Diagnosis Date   Abdominal bloating 07/20/2020   Allergy    Anal fissure    Anemia    Anxiety  Arthritis    all over body    Benign prostatic hyperplasia    Bilateral lower extremity edema 03/22/2018   Cataract    removed bilat    Chest pain 02/2018   Chronic kidney disease    Chronic kidney disease, stage 3a (HCC) 01/19/2016   Chronic pain    Colon polyp    Diabetes mellitus without complication (HCC)    Falls 05/19/2021   GERD (gastroesophageal reflux disease)    Headache(784.0)    Hemorrhoids    Hepatic steatosis 05/19/2021   Hyperlipidemia    Hx: of   Hypertension    IBS (irritable bowel syndrome)    Incontinent of urine    Hx:of   Migraines    Neuromuscular disorder (HCC)    periferal neuropathy per patient    New onset right bundle branch block (RBBB)    OSA (obstructive sleep apnea) 11/2009   sleep study with mild OSA, pt unwilling to use CPAP   Seizures (HCC)    pt states he has never had a seizure    Sleep apnea    mild    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    Past Surgical History:  Procedure Laterality Date   CARDIAC CATHETERIZATION  06/04/2009   mild to moderate CAD  w/o hemodynamic significant lesion   CATARACT EXTRACTION     bilat   CHOLECYSTECTOMY  2008   COLONOSCOPY  2012   Dr Tova Fresh   HEMORRHOID SURGERY     anal surgery   INGUINAL HERNIA REPAIR Bilateral 03/11/2013   Procedure: LAPAROSCOPIC BILATERAL INGUINAL HERNIA REPAIR;  Surgeon: Shela Derby, MD;  Location: [facility] OR;  Service: General;  Laterality: Bilateral;   INSERTION OF MESH Bilateral 03/11/2013   Procedure: INSERTION OF MESH;  Surgeon: Shela Derby, MD;  Location: [facility] OR;  Service: General;  Laterality: Bilateral;   NM MYOCAR PERF WALL MOTION  05/27/2009   mild iscemia mid inferio & apical regions.   NM MYOCAR PERF WALL MOTION  11/01/2012   negative for ischemia   POLYPECTOMY     TRANSURETHRAL RESECTION OF PROSTATE N/A 02/18/2021   Procedure: TRANSURETHRAL RESECTION OF THE PROSTATE (TURP), BIPOLAR;  Surgeon: Adelbert Homans, MD;  Location: [facility];  Service: Urology;  Laterality: N/A;   US  ECHOCARDIOGRAPHY  05/27/2009   mild mitral annular ca+,AOV mildly sclerotic    Current Outpatient Medications  Medication Sig Dispense Refill   acetaminophen  (TYLENOL ) 500 MG tablet Take 500 mg by mouth as directed.     alfuzosin (UROXATRAL) 10 MG 24 hr tablet Take 10 mg by mouth daily with breakfast.     atorvastatin  (LIPITOR) 40 MG tablet TAKE 1 TABLET(40 MG) BY MOUTH DAILY 90 tablet 3   betamethasone  dipropionate 0.05 % cream Apply topically 2 (two) times daily. Apply just outside the ear canal two times per day for 2 weeks 30 g 0   bisacodyl  (DULCOLAX) 5 MG EC tablet Take 5 mg  by mouth as directed.     cetirizine  (ZYRTEC ) 10 MG tablet Take 1 tablet (10 mg total) by mouth daily. 30 tablet 2   Cholecalciferol  (VITAMIN D3) 250 MCG (10000 UT) TABS Take by mouth.     dexlansoprazole  (DEXILANT ) 60 MG capsule Take 1 capsule (60 mg total) by mouth daily. Office visit for further refills 30 capsule 2   diclofenac  Sodium (VOLTAREN ) 1 % GEL Apply 4 g topically 4 (four) times daily. 350 g 2   famotidine  (PEPCID ) 40 MG  tablet Take 1 tablet (40 mg total) by mouth at bedtime as needed for heartburn or indigestion. 30 tablet 11   finasteride  (PROSCAR ) 5 MG tablet Take 5 mg by mouth daily.     Fluocinolone  Acetonide (DERMOTIC ) 0.01 % OIL Place 3 drops in ear(s) 2 (two) times a week. 20 mL 5   fluticasone  (FLONASE ) 50 MCG/ACT nasal spray USE 1 SPRAY IN EACH NOSTRIL DAILY 16 g 1   halobetasol  (ULTRAVATE ) 0.05 % cream APPLY TOPICALLY TO THE AFFECTED AREA DAILY 50 g 2   lisinopril  (ZESTRIL ) 5 MG tablet Take 1 tablet (5 mg total) by mouth daily. 90 tablet 3   ofloxacin  (FLOXIN ) 0.3 % OTIC solution SMARTSIG:In Ear(s)     Olopatadine  HCl 0.2 % SOLN Apply 1 drop to eye daily. Apply to both eyes once daily. 2.5 mL 0   polyethylene glycol powder (GLYCOLAX /MIRALAX ) 17 GM/SCOOP powder TAKE 1 SCOOP(17 GRAM) DAILY FOR CONSTIPATION 510 g 0   Polyvinyl Alcohol -Povidone PF (REFRESH) 1.4-0.6 % SOLN Apply 1 drop to eye daily. 50 each 1   Propylene Glycol (SYSTANE COMPLETE OP) Apply to eye.     tamsulosin  (FLOMAX ) 0.4 MG CAPS capsule Take 0.4 mg by mouth.     torsemide  (DEMADEX ) 20 MG tablet Take 20 mg by mouth daily.     UNABLE TO FIND Med Name: Natural ear drops     No current facility-administered medications for this visit.    Allergies as of 04/14/2024 - Review Complete 04/14/2024  Allergen Reaction Noted   Pantoprazole  Other (See Comments) 05/16/2016   Aspirin  Other (See Comments)    Lubiprostone  Other (See Comments) 05/12/2016   Vitamin c Other (See Comments) 10/30/2012    Family History   Problem Relation Age of Onset   Hypertension Mother    Colon cancer Neg Hx    Colon polyps Neg Hx    Esophageal cancer Neg Hx    Rectal cancer Neg Hx    Stomach cancer Neg Hx    Migraines Neg Hx    Headache Neg Hx     Social History   Socioeconomic History   Marital status: Widowed    Spouse name: Not on file   Number of children: 5   Years of education: 16   Highest education level: Bachelor's degree (e.g., BA, AB, BS)  Occupational History   Not on file  Tobacco Use   Smoking status: Never   Smokeless tobacco: Never  Vaping Use   Vaping status: Never Used  Substance and Sexual Activity   Alcohol  use: No   Drug use: No   Sexual activity: Not Currently  Other Topics Concern   Not on file  Social History Narrative   Mr Pressley lives with his son and daughter with the daughter's two children ages.  Two of his daughter's children are off in college (04/2021)   Mr Thilges has two other children in [place].      Mr Vaneaton is by himself much of the day as his daughter works long day shifts.    Mr. Tanguma cooks, does laundry for family, and housework.       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 [employer] Insecurity: Not on file  Transportation Needs: Unmet Transportation Needs (04/23/2019)   PRAPARE - Administrator, Civil Service (Medical): Yes    Lack of Transportation (Non-Medical): Yes  Physical Activity: Not on file  Stress: Not on file  Social Connections: Not on file  Intimate Partner Violence: Not on file    Review of Systems:    Constitutional: No weight loss, fever or chills Cardiovascular: No chest pain Respiratory: No SOB  Gastrointestinal: See HPI and otherwise negative Genitourinary: No dysuria  Neurological: No headache, dizziness or syncope Musculoskeletal: No new muscle or joint pain Hematologic: No bleeding Psychiatric: No history of depression or anxiety   Physical Exam:  Vital signs: BP (!)  102/58 (BP Location: Left Arm, Patient Position: Sitting, Cuff Size: Normal)   Pulse 69   Ht 5' 5 (1.651 m)   Wt 176 lb 2 oz (79.9 kg)   BMI 29.31 kg/m   Constitutional:   Pleasant [place] male appears to be in NAD, Well developed, Well nourished, alert and cooperative Respiratory: Respirations even and unlabored. Lungs clear to auscultation bilaterally.   No wheezes, crackles, or rhonchi.  Cardiovascular: Normal S1, S2. No MRG. Regular rate and rhythm. No peripheral edema, cyanosis or pallor.  Gastrointestinal:  Soft, mild increase in abdominal distension, nontender. No rebound or guarding. Normal bowel sounds. No appreciable masses or hepatomegaly. Rectal:  Not performed.  Psychiatric: Oriented to person, place and time. Demonstrates good judgement and reason without abnormal affect or behaviors.  RELEVANT LABS AND IMAGING: CBC    Component Value Date/Time   WBC 7.8 02/19/2021 0314   RBC 4.38 02/19/2021 0314   HGB 11.7 (L) 02/19/2021 0314   HGB 14.1 01/20/2020 1334   HCT 36.6 (L) 02/19/2021 0314   HCT 44.7 01/20/2020 1334   PLT 201 02/19/2021 0314   PLT 202 01/20/2020 1334   MCV 83.6 02/19/2021 0314   MCV 84 01/20/2020 1334   MCH 26.7 02/19/2021 0314   MCHC 32.0 02/19/2021 0314   RDW 14.1 02/19/2021 0314   RDW 17.4 (H) 01/20/2020 1334   LYMPHSABS 2.0 12/24/2019 1442   MONOABS 0.4 11/22/2019 1148   EOSABS 0.1 12/24/2019 1442   BASOSABS 0.0 12/24/2019 1442    CMP     Component Value Date/Time   NA 140 11/27/2023 1107   K 4.0 11/27/2023 1107   CL 103 11/27/2023 1107   CO2 22 11/27/2023 1107   GLUCOSE 131 (H) 11/27/2023 1107   GLUCOSE 107 (H) 02/18/2021 1100   BUN 22 11/27/2023 1107   CREATININE 1.47 (H) 11/27/2023 1107   CREATININE 1.55 (H) 07/31/2016 1502   CALCIUM  8.4 (L) 11/27/2023 1107   PROT 7.4 11/22/2019 1148   PROT 6.9 07/15/2019 1536   ALBUMIN 3.4 (L) 11/22/2019 1148   ALBUMIN 4.1 07/15/2019 1536   AST 29 11/22/2019 1148   ALT 25 11/22/2019 1148    ALKPHOS 52 11/22/2019 1148   BILITOT 0.9 11/22/2019 1148   BILITOT 0.4 07/15/2019 1536   GFRNONAA 54 (L) 02/18/2021 1100   GFRNONAA 43 (L) 07/31/2016 1502   GFRAA 55 (L) 08/23/2020 1129   GFRAA 49 (L) 07/31/2016 1502    Assessment: 1.  GERD: Chronic for the patient, has been tried on various products currently on Dexilant  60 mg daily and Famotidine  40 mg nightly 2.  Chronic constipation: Chronic for the patient, tried on various products but uncertain if they have helped or not as he is a poor historian; likely slow transit +/- IBS 3.  Bloating: Likely with both above +/- SIBO, has been treated with Xifaxan  in the past but uncertain if it helped or not  Plan: 1.  Discussed with patient that all the symptoms are chronic for him.  It is hard because he  is a poor historian so not sure if any of the things we have actually given him at been used and/or have been helpful. 2.  At this time we will retrial Amitiza  24 mcg twice daily with food #60 with 5 refills. 3.  Refill Dexilant  60 mg daily #90 with 3 refills 4.  Increase Famotidine  to 40 mg twice a day, every morning and nightly #60 with 11 refills 5.  Discussed SIBO testing.  Patient does not think he could accomplish this at home.  Told him to call back in a month and if he remains with bloating and symptoms are no better we can treat him empirically with Xifaxan  which has been done in the past but are not sure if it helped or not. 6.  Patient to follow in clinic in the next 2 to 3 months with Dr. Elvin Hammer or an app in his pod.  Reginal Capra, PA-C [facility] 04/14/2024, 9:08 AM  Cc: Glenn Lange, DO

## 2024-04-14 NOTE — Procedures (Unsigned)
 Physician Interpretation:    Referred by: Dr. Salli Crawley   History and Indication for Testing: 83 year old male with an underlying complex medical history of arthritis, lower extremity edema, chronic kidney disease, diabetes, reflux disease, history of falls, hypertension, hyperlipidemia, irritable bowel syndrome, recurrent headaches, and overweight state, who reports snoring and excessive daytime somnolence.  His Epworth sleepiness score is 16 out of 24, fatigue severity score is 39 out of 63.    TITRATION DETAILS (SEE ALSO TABLE AT THE END OF THE REPORT):  This patient qualified for an emergency split study secondary to severe sleep disordered breathing.  His baseline AHI was 75.3/h, O2 nadir 72% during supine non-REM sleep.  The patient was shown several different interfaces and was subsequently fitted with a small fullface mask from [employer]  He was started on CPAP of 5 cm and gradually titrated to a CPAP of 16 cm.  Due to ongoing severe sleep disordered breathing he was switched to standard BiPAP therapy of 20/15 cm and titrated to a final pressure of 22/18 cm but achieved very little sleep on the final setting.  His AHI was 1.8/h during the BiPAP titration on a pressure of 20/15 cm, O2 nadir 90% with nonsupine REM sleep achieved.    EEG: Review of the EEG showed no abnormal electrical discharges and symmetrical bihemispheric findings.     EKG: The EKG revealed normal sinus rhythm (NSR). ***   AUDIO/VIDEO REVIEW: The audio and video review did not show any abnormal or unusual behaviors, movements, phonations or vocalizations. The patient took 2 restroom breaks. Snoring was noted, in the mild range, improved with PAP therapy.   POST-STUDY QUESTIONNAIRE: Post study, the patient indicated, that sleep was the same as usual.    IMPRESSION:   Severe Obstructive Sleep Apnea (OSA) Dysfunctions associated with sleep stages or arousal from sleep ***Non-specific abnormal  electrocardiogram (EKG) ***Poor sleep pattern ***Inconclusive Test   RECOMMENDATIONS:     This patient has severe obstructive sleep apnea. The patient qualified for an emergency split sleep study per AASM standards. His baseline AHI was 75.3/h, O2 nadir 72% during supine non-REM sleep.  Of note, the absence of REM sleep during the baseline portion of the study likely underestimated his sleep disordered breathing.  The patient responded reasonably well to BiPAP therapy but standard CPAP therapy did not result in a significant reduction of his severe sleep disordered breathing. I recommend home standard BiPAP therapy with a pressure of 20/15 cm via small fullface mask and heated humidity.  The patient will be advised to be fully compliant with PAP therapy to improve sleep related symptoms and decrease long term cardiovascular risks. Please note, that untreated obstructive sleep apnea may carry additional perioperative morbidity. Patients with significant obstructive sleep apnea should receive perioperative PAP therapy and the surgeons and particularly the anesthesiologist should be informed of the diagnosis and the severity of the sleep disordered breathing. This study shows sleep fragmentation and abnormal sleep stage percentages; these are nonspecific findings and per se do not signify an intrinsic sleep disorder or a cause for the patient's sleep-related symptoms. Causes include (but are not limited to) the first night effect of the sleep study, circadian rhythm disturbances, medication effect or an underlying mood disorder or medical problem.  The patient should be cautioned not to drive, work at heights, or operate dangerous or heavy equipment when tired or sleepy. Review and reiteration of good sleep hygiene measures should be pursued with any patient. The patient will be seen  in follow-up in the sleep clinic at [facility] for discussion of the test results, symptom and treatment compliance review, further  management strategies, etc. The referring provider will be notified of the test results.    I certify that I have reviewed the entire raw data recording prior to the issuance of this report in accordance with the Standards of Accreditation of the American Academy of Sleep Medicine (AASM).   Debbra Fairy, MD, PhD Medical Director, [facility]) Diplomat, ABPN (Neurology and Sleep)   Technical Report:   ***  Titration Table:  ***

## 2024-04-15 ENCOUNTER — Ambulatory Visit: Payer: Self-pay | Admitting: Neurology

## 2024-04-15 DIAGNOSIS — G4733 Obstructive sleep apnea (adult) (pediatric): Secondary | ICD-10-CM

## 2024-04-16 NOTE — Telephone Encounter (Signed)
-----   Message from Debbra Fairy sent at 04/15/2024  5:24 PM EDT ----- Interpreter needed. Urgent set up requested on PAP therapy, due to severe OSA. Patient referred by Dr. Salli Crawley, seen by me on 02/14/24, patient had a split night sleep study on 04/01/24. Please call and notify patient that the recent sleep study showed severe obstructive sleep apnea (OSA). He did fairly well with BiPAP during the study with significant improvement of the respiratory  events. I would like start the patient on a new BiPAP machine for home use. I placed the order in the chart.  Please advise patient that we will need a follow up appointment with either myself or one of our nurse practitioners in about 2-3 months post set-up to check for how they are doing on treatment and  how well it's going with the machine in general. Most insurance company require a certain compliance percentage to continue to cover/pay for the machine. Please ask patient to schedule this FU  appointment, according to the set-up date, which is the day they receive the machine. Please make sure, the patient understands the importance of keeping this window for the FU appointment, as it is  often an Barista and not our rule. Failing to adhere to this may result in losing coverage for sleep apnea treatment, at which point some insurances require repeating the whole process.  Plus, monitoring compliance data is usually good feedback for the patient as far as how they are doing, how many hours they are on it, how well the mask fits, etc.  Also remind patient, that any PAP machine or mask issues should be first addressed with the DME company, who provided the machine/supplies.  Please ask if patient has a preference regarding DME company, may depend on the insurance too.  Debbra Fairy, MD, PhD [facility])   ----- Message ----- From: Debbra Fairy, MD Sent: 04/15/2024   5:21 PM EDT To: Debbra Fairy, MD

## 2024-04-16 NOTE — Telephone Encounter (Signed)
 Called pt using [employer] (rep named Blue [phone]). LVM with office number asking for call back.

## 2024-04-17 NOTE — Progress Notes (Signed)
 Attempted to reach pt again via [address] [place] Tam [phone]) LVM for patient asking for him to call us  back.

## 2024-04-22 NOTE — Progress Notes (Signed)
 Attempted to reach daughter Sinh Cil. I LVM asking for call back.

## 2024-04-23 NOTE — Telephone Encounter (Addendum)
 I called and interpreter Huong ID [phone] [facility] for pt that we are calling with sleep study results for pt. He is to call back.  I called Cil daughter of pt (ok per DPR) and not her #.  Home # no answer.

## 2024-04-24 ENCOUNTER — Encounter: Payer: Self-pay | Admitting: *Deleted

## 2024-04-24 NOTE — Telephone Encounter (Signed)
-----   Message from Debbra Fairy sent at 04/15/2024  5:24 PM EDT ----- Interpreter needed. Urgent set up requested on PAP therapy, due to severe OSA. Patient referred by Dr. Salli Crawley, seen by me on 02/14/24, patient had a split night sleep study on 04/01/24. Please call and notify patient that the recent sleep study showed severe obstructive sleep apnea (OSA). He did fairly well with BiPAP during the study with significant improvement of the respiratory  events. I would like start the patient on a new BiPAP machine for home use. I placed the order in the chart.  Please advise patient that we will need a follow up appointment with either myself or one of our nurse practitioners in about 2-3 months post set-up to check for how they are doing on treatment and  how well it's going with the machine in general. Most insurance company require a certain compliance percentage to continue to cover/pay for the machine. Please ask patient to schedule this FU  appointment, according to the set-up date, which is the day they receive the machine. Please make sure, the patient understands the importance of keeping this window for the FU appointment, as it is  often an Barista and not our rule. Failing to adhere to this may result in losing coverage for sleep apnea treatment, at which point some insurances require repeating the whole process.  Plus, monitoring compliance data is usually good feedback for the patient as far as how they are doing, how many hours they are on it, how well the mask fits, etc.  Also remind patient, that any PAP machine or mask issues should be first addressed with the DME company, who provided the machine/supplies.  Please ask if patient has a preference regarding DME company, may depend on the insurance too.  Debbra Fairy, MD, PhD [facility])   ----- Message ----- From: Debbra Fairy, MD Sent: 04/15/2024   5:21 PM EDT To: Debbra Fairy, MD

## 2024-04-24 NOTE — Telephone Encounter (Signed)
 Mailed letter to pt to return call about being treated for severe sleep apnea.

## 2024-04-25 ENCOUNTER — Telehealth: Payer: Self-pay | Admitting: Physician Assistant

## 2024-04-25 NOTE — Telephone Encounter (Signed)
 Patient requesting f/u call to discuss medication management. Please advise.

## 2024-04-25 NOTE — Telephone Encounter (Signed)
 Stomach is burning and hurting with the Amitiza . He says bowel movements are very good, but it hurts his stomach. He wants to know what he can do about this.

## 2024-04-28 NOTE — Telephone Encounter (Signed)
 The patient does not want anything for his burning stomach. He will take Dexilant  daily and famotidine  twice daily for now. He says he cannot take the lubiprostone  because it makes his stomach hurt. It does produce a bowel movement, but it causes stomach pain. He is taking gentle laxative that he gets from his pharmacy.

## 2024-04-29 NOTE — Telephone Encounter (Signed)
 Attempted to contact the patient using [employer]pre[employer] ID [phone]. Patient did not answer. Left message of my call. I was using the interpreter this time to be certain I was not misunderstanding the patient's complaints.

## 2024-04-29 NOTE — Telephone Encounter (Signed)
 Pharmacist tells me the patient gets lubiprostone  #180 for zero co-pay.  Famotidine  is also zero co-pay. Both were filled on 04/15/24.

## 2024-05-05 NOTE — Telephone Encounter (Signed)
 Called using interpreter for [place] No answer. Left message of my call.

## 2024-05-05 NOTE — Telephone Encounter (Signed)
 Patient returning phone call requesting a call to discuss medication for constipation further. Please advise, thank you.

## 2024-05-06 ENCOUNTER — Telehealth: Payer: Self-pay | Admitting: Neurology

## 2024-05-06 NOTE — Telephone Encounter (Signed)
 RE: new BIPAP machine.  NEEDS interpreter vietnamese/montangard Received: Today New, [name], RN; New, Bradley; Cain, Mitchell; Ziegler, Melissa; Garcia, Patricia; 1 other Received, thank you!     Previous Messages    ----- Message ----- From: [name], RN Sent: 05/06/2024   2:58 PM EDT To: Adine Leer; Avelina Sprung; Ephraim Dollar* Subject: new BIPAP machine.  NEEDS interpreter vietna*  New BIPAP user SEVERE OSA.  URGENT set up  Omar Sanders Male, 83 y.o., 25-Aug-1941 MRN: [id number] Phone: [phone] Needs Interpreter: Maryruth Ask

## 2024-05-06 NOTE — Telephone Encounter (Signed)
 Pt called back, I relayed that he has osa severe.  Dr. Buck recommend urgent set up for bipap.  Will send to [employer] for pre authorization then they will call him.  There is a communication barrier.  Needs interpreter [place]/Vietnamese.  I sent message to [employer] for urgent set up.

## 2024-05-06 NOTE — Telephone Encounter (Signed)
 Pt has called in response to a letter so he can be treated for severe sleep apnea , call routed to POD 4

## 2024-05-12 DIAGNOSIS — R3915 Urgency of urination: Secondary | ICD-10-CM | POA: Diagnosis not present

## 2024-05-12 DIAGNOSIS — R3911 Hesitancy of micturition: Secondary | ICD-10-CM | POA: Diagnosis not present

## 2024-05-13 NOTE — Telephone Encounter (Signed)
 I called [employer].  They have received the ordr, processing (has 2 insurances to be verified) then will call pt.

## 2024-05-13 NOTE — Telephone Encounter (Signed)
 Pt called stating that he received a letter from us  and he does not understand what its for. I tried to explain to pt that he needs to call DME to set up an appt for his Bipap machine. Pt brought up his insurance. Please call pt with Interpreter.

## 2024-05-15 NOTE — Telephone Encounter (Signed)
 I called pt with Hanh ID [id number] via language interpreters.  She LMVM that I called back.  (I did relay that if pt was needing [place]) to understand better then will call Harlene at [phone] back to get a interpreter with that specific dialect.

## 2024-05-27 DIAGNOSIS — N1832 Chronic kidney disease, stage 3b: Secondary | ICD-10-CM | POA: Diagnosis not present

## 2024-06-02 ENCOUNTER — Other Ambulatory Visit: Payer: Self-pay

## 2024-06-02 DIAGNOSIS — R609 Edema, unspecified: Secondary | ICD-10-CM | POA: Diagnosis not present

## 2024-06-02 DIAGNOSIS — I129 Hypertensive chronic kidney disease with stage 1 through stage 4 chronic kidney disease, or unspecified chronic kidney disease: Secondary | ICD-10-CM | POA: Diagnosis not present

## 2024-06-02 DIAGNOSIS — D631 Anemia in chronic kidney disease: Secondary | ICD-10-CM | POA: Diagnosis not present

## 2024-06-02 DIAGNOSIS — N1832 Chronic kidney disease, stage 3b: Secondary | ICD-10-CM | POA: Diagnosis not present

## 2024-06-02 DIAGNOSIS — E559 Vitamin D deficiency, unspecified: Secondary | ICD-10-CM | POA: Diagnosis not present

## 2024-06-02 DIAGNOSIS — E785 Hyperlipidemia, unspecified: Secondary | ICD-10-CM

## 2024-06-02 DIAGNOSIS — E1122 Type 2 diabetes mellitus with diabetic chronic kidney disease: Secondary | ICD-10-CM | POA: Diagnosis not present

## 2024-06-02 MED ORDER — ATORVASTATIN CALCIUM 40 MG PO TABS: ORAL_TABLET | ORAL | 3 refills | Status: AC

## 2024-06-26 ENCOUNTER — Other Ambulatory Visit: Payer: Self-pay

## 2024-06-26 DIAGNOSIS — G8929 Other chronic pain: Secondary | ICD-10-CM

## 2024-06-26 DIAGNOSIS — L309 Dermatitis, unspecified: Secondary | ICD-10-CM

## 2024-06-26 MED ORDER — DICLOFENAC SODIUM 1 % EX GEL
4.0000 g | Freq: Four times a day (QID) | CUTANEOUS | 2 refills | Status: DC
Start: 2024-06-26 — End: 2024-07-09

## 2024-07-04 ENCOUNTER — Other Ambulatory Visit: Payer: Self-pay

## 2024-07-04 DIAGNOSIS — L309 Dermatitis, unspecified: Secondary | ICD-10-CM

## 2024-07-09 ENCOUNTER — Ambulatory Visit: Admitting: Family Medicine

## 2024-07-09 ENCOUNTER — Encounter: Payer: Self-pay | Admitting: Family Medicine

## 2024-07-09 VITALS — BP 100/60 | HR 54 | Ht 65.0 in | Wt 178.0 lb

## 2024-07-09 DIAGNOSIS — L821 Other seborrheic keratosis: Secondary | ICD-10-CM | POA: Diagnosis not present

## 2024-07-09 DIAGNOSIS — M25512 Pain in left shoulder: Secondary | ICD-10-CM

## 2024-07-09 DIAGNOSIS — E1122 Type 2 diabetes mellitus with diabetic chronic kidney disease: Secondary | ICD-10-CM

## 2024-07-09 DIAGNOSIS — L209 Atopic dermatitis, unspecified: Secondary | ICD-10-CM

## 2024-07-09 DIAGNOSIS — G8929 Other chronic pain: Secondary | ICD-10-CM

## 2024-07-09 DIAGNOSIS — Z23 Encounter for immunization: Secondary | ICD-10-CM | POA: Diagnosis not present

## 2024-07-09 DIAGNOSIS — N184 Chronic kidney disease, stage 4 (severe): Secondary | ICD-10-CM

## 2024-07-09 DIAGNOSIS — E1142 Type 2 diabetes mellitus with diabetic polyneuropathy: Secondary | ICD-10-CM

## 2024-07-09 LAB — POCT GLYCOSYLATED HEMOGLOBIN (HGB A1C): HbA1c, POC (controlled diabetic range): 7.1 % — AB (ref 0.0–7.0)

## 2024-07-09 MED ORDER — METFORMIN HCL ER 500 MG PO TB24: 500.0000 mg | ORAL_TABLET | Freq: Every day | ORAL | 1 refills | Status: AC

## 2024-07-09 MED ORDER — DICLOFENAC SODIUM 1 % EX GEL: 4.0000 g | Freq: Four times a day (QID) | CUTANEOUS | 2 refills | Status: DC

## 2024-07-09 MED ORDER — SHINGRIX 50 MCG/0.5ML IM SUSR: INTRAMUSCULAR | 1 refills | Status: DC

## 2024-07-09 NOTE — Assessment & Plan Note (Signed)
 A1c slightly increased 7.1 from 6.7 previously.  Discussed restarting metformin .  Metformin  500 mg XR sent to patient's pharmacy patient agreeable. Offered initiation of gabapentin  for neuropathic pain.  Patient preferred to trial Voltaren  gel on his feet and reinitiate compression socks for his lower extremity edema as above.

## 2024-07-09 NOTE — Assessment & Plan Note (Signed)
 Refilled Voltaren  gel per patient request.

## 2024-07-09 NOTE — Patient Instructions (Addendum)
 It was great to see you! Thank you for allowing me to participate in your care!  Our plans for today:  - I will let you know the results of your labs today. - I have refilled your Voltaren  gel moving this up at your pharmacy. - Please place this at least 3-4 times daily on your feet to help with your numbness and tingling. - I recommend compression stockings as well to help with the swelling in your feet. - I have restarted your metformin  for your diabetes, you can take this once daily. - I will see you again in 3 months.   Please arrive 15 minutes PRIOR to your next scheduled appointment time! If you do not, this affects OTHER patients' care.  Take care and seek immediate care sooner if you develop any concerns.   Omar Provencal, MD, PGY-3 [facility] 10:21 AM 07/09/2024  [facility]

## 2024-07-09 NOTE — Assessment & Plan Note (Signed)
 Only renally protective drug taking is lisinopril .  On torsemide  for lower extremity edema.  BMP ordered today however patient left before labs could be drawn.  Recommended to use his compression socks during the day.

## 2024-07-09 NOTE — Progress Notes (Signed)
    SUBJECTIVE:   CHIEF COMPLAINT / HPI: annual  AAA Last CT Chest Dec 2024 with increase to 4.4x4.3cm. No chest or back pain  Atopic dermatitis Brings in notecard with all of his medicine. Has halobetasol  cream and betamethasone  cream on med list Has complaint of spots on skin Multiple spots on skin There for long time Only uses as needed  Numb feet Ongoing for 3 years Same on both sides Numbess, and tingling is diffuse across toes and front of foot on both sides Worse in winter with cold Pain is burning Does not turn blue or purple   Normal colonoscopy 2021. No smoking or substance history. Never tested for HIV, Hep B, Hep C.  PERTINENT  PMH / PSH: HTN, Aortic Aneurysm, GERD, IBS, CKD, BPH  OBJECTIVE:   BP 100/60   Pulse (!) 54   Ht 5' 5 (1.651 m)   Wt 178 lb (80.7 kg)   SpO2 98%   BMI 29.62 kg/m   General: A&O, NAD, HEENT: No sign of trauma, EOM grossly intact, moist mucous membranes Cardiac: RRR, no m/r/g Respiratory: CTAB, normal WOB, no w/c/r GI: Soft, NTTP, non-distended, no rebound or guarding Extremities: NTTP, no peripheral edema. Neuro: Moves all four extremities appropriately. Psych: Appropriate mood and affect Skin: See photos under media tab, multiple seborrheic keratoses present along the right side of the face, sun spots present, scar present on the medial aspect of the distal quadriceps, large circular hyperpigmented patch on lateral mid calf Foot: Sensation intact to monofilament testing bilaterally, palpable DP pulses     ASSESSMENT/PLAN:   Assessment & Plan CKD (chronic kidney disease), stage IV (HCC) Only renally protective drug taking is lisinopril .  On torsemide  for lower extremity edema.  BMP ordered today however patient left before labs could be drawn.  Recommended to use his compression socks during the day. Type 2 diabetes mellitus with chronic kidney disease, without long-term current use of insulin , unspecified CKD stage  (HCC) Diabetic peripheral neuropathy (HCC) A1c slightly increased 7.1 from 6.7 previously.  Discussed restarting metformin .  Metformin  500 mg XR sent to patient's pharmacy patient agreeable. Offered initiation of gabapentin  for neuropathic pain.  Patient preferred to trial Voltaren  gel on his feet and reinitiate compression socks for his lower extremity edema as above. Atopic dermatitis, unspecified type Seborrheic keratoses Benign seborrheic keratoses on exam.  Suspect that right lower extremity patch is also seborrheic, but reasonable for further evaluation in skin clinic.  Patient recommended to schedule in skin clinic at checkout.  Removed halobetasol  cream from patient's medication list.  Discussed that he can use his betamethasone  cream as needed for eczema flares. Chronic left shoulder pain Refilled Voltaren  gel per patient request. Need for vaccination Shingrix  vaccine prescription printed and provided for patient to get second dose at his pharmacy.  Return in about 2 months (around 09/08/2024).  Omar Provencal, MD [facility]

## 2024-07-09 NOTE — Assessment & Plan Note (Signed)
 Benign seborrheic keratoses on exam.  Suspect that right lower extremity patch is also seborrheic, but reasonable for further evaluation in skin clinic.  Patient recommended to schedule in skin clinic at checkout.  Removed halobetasol  cream from patient's medication list.  Discussed that he can use his betamethasone  cream as needed for eczema flares.

## 2024-07-10 ENCOUNTER — Ambulatory Visit: Payer: Self-pay | Admitting: Student

## 2024-07-10 LAB — BASIC METABOLIC PANEL WITH GFR
BUN/Creatinine Ratio: 13 (ref 10–24)
BUN: 21 mg/dL (ref 8–27)
CO2: 22 mmol/L (ref 20–29)
Calcium: 8.5 mg/dL — ABNORMAL LOW (ref 8.6–10.2)
Chloride: 103 mmol/L (ref 96–106)
Creatinine, Ser: 1.56 mg/dL — ABNORMAL HIGH (ref 0.76–1.27)
Glucose: 100 mg/dL — ABNORMAL HIGH (ref 70–99)
Potassium: 3.4 mmol/L — ABNORMAL LOW (ref 3.5–5.2)
Sodium: 143 mmol/L (ref 134–144)
eGFR: 43 mL/min/1.73 — ABNORMAL LOW (ref >59)

## 2024-07-10 LAB — LIPID PANEL
Chol/HDL Ratio: 4.6 ratio (ref 0.0–5.0)
Cholesterol, Total: 123 mg/dL (ref 100–199)
HDL: 27 mg/dL — ABNORMAL LOW (ref >39)
LDL Chol Calc (NIH): 60 mg/dL (ref 0–99)
Triglycerides: 216 mg/dL — ABNORMAL HIGH (ref 0–149)
VLDL Cholesterol Cal: 36 mg/dL (ref 5–40)

## 2024-07-14 ENCOUNTER — Other Ambulatory Visit: Payer: Self-pay

## 2024-07-14 DIAGNOSIS — G8929 Other chronic pain: Secondary | ICD-10-CM

## 2024-07-26 ENCOUNTER — Other Ambulatory Visit: Payer: Self-pay | Admitting: Cardiovascular Disease

## 2024-07-28 ENCOUNTER — Telehealth: Payer: Self-pay | Admitting: Family Medicine

## 2024-07-28 DIAGNOSIS — I152 Hypertension secondary to endocrine disorders: Secondary | ICD-10-CM

## 2024-07-28 NOTE — Telephone Encounter (Signed)
 Patient called stating he needs a refill on his lisinopril  please

## 2024-07-30 MED ORDER — LISINOPRIL 5 MG PO TABS: 5.0000 mg | ORAL_TABLET | Freq: Every day | ORAL | 0 refills | Status: DC

## 2024-07-30 NOTE — Addendum Note (Signed)
 Addended by: ALBA SHARPER on: 07/30/2024 08:54 PM   Modules accepted: Orders

## 2024-08-13 ENCOUNTER — Encounter: Payer: Self-pay | Admitting: Family Medicine

## 2024-08-13 ENCOUNTER — Ambulatory Visit: Admitting: Family Medicine

## 2024-08-13 VITALS — BP 122/56 | HR 54 | Ht 65.0 in | Wt 176.0 lb

## 2024-08-13 DIAGNOSIS — L814 Other melanin hyperpigmentation: Secondary | ICD-10-CM | POA: Diagnosis not present

## 2024-08-13 DIAGNOSIS — N1831 Chronic kidney disease, stage 3a: Secondary | ICD-10-CM | POA: Diagnosis not present

## 2024-08-13 DIAGNOSIS — M25512 Pain in left shoulder: Secondary | ICD-10-CM

## 2024-08-13 DIAGNOSIS — G8929 Other chronic pain: Secondary | ICD-10-CM

## 2024-08-13 DIAGNOSIS — E1122 Type 2 diabetes mellitus with diabetic chronic kidney disease: Secondary | ICD-10-CM

## 2024-08-13 DIAGNOSIS — Z23 Encounter for immunization: Secondary | ICD-10-CM

## 2024-08-13 DIAGNOSIS — L821 Other seborrheic keratosis: Secondary | ICD-10-CM | POA: Diagnosis not present

## 2024-08-13 MED ORDER — DICLOFENAC SODIUM 1 % EX GEL: 4.0000 g | Freq: Four times a day (QID) | CUTANEOUS | 0 refills | Status: AC

## 2024-08-13 MED ORDER — SHINGRIX 50 MCG/0.5ML IM SUSR
INTRAMUSCULAR | 1 refills | Status: AC
Start: 2024-08-13 — End: ?

## 2024-08-13 MED ORDER — TRETINOIN 0.025 % EX CREA: TOPICAL_CREAM | Freq: Every day | CUTANEOUS | 0 refills | Status: AC

## 2024-08-13 NOTE — Assessment & Plan Note (Addendum)
 Given GFR 43-47 in recent months, would stage as CKD 3a. - Increase metformin  XR to 1000 mg daily - Avoid oral NSAIDs - Continue lisinopril  5 mg daily - Already on atorvastatin  40 mg, continue

## 2024-08-13 NOTE — Progress Notes (Signed)
   SUBJECTIVE:   CHIEF COMPLAINT / HPI:  Omar Sanders is a 83 y.o. 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  Patient declined offer of interpreter for this visit.  T2DM of chronic kidney disease A1c 7.1 on 9/10 in clinic.  BMP ordered that visit, but patient left prior to labs being drawn. Continuing metformin  500 mg daily without side effects.  Patient reports that he has had a few lesions on his skin. Right side of face has had hyperpigmented macules for about 3 years. Has a smaller hyperpigmented macule on his right shin for about 3 years as well. On his left knee, he has a few postinflammatory scars present for about 1 year and newer, mildly erythematous papule present for about 1 year but now    PERTINENT PMH / PSH: T2DM of chronic kidney disease, previously documented stage IV however currently labs consistent with stage IIIa Diabetic peripheral neuropathy HLD, BPH, hepatic steatosis, HTN  *Remainder reviewed in problem list.   OBJECTIVE:   BP (!) 122/56   Pulse (!) 54   Ht 5' 5 (1.651 m)   Wt 176 lb (79.8 kg)   SpO2 100%   BMI 29.29 kg/m   General: Age-appropriate, resting comfortably in chair, NAD, alert and at baseline. Cardiovascular: Regular rate and rhythm. Normal S1/S2. No murmurs, rubs, or gallops appreciated. 2+ radial pulses. Pulmonary: Clear bilaterally to ascultation. No wheezes, crackles, or rhonchi. Normal WOB on room air. No accessory muscle use. Skin: Warm and dry.  Scattered mildly verrucous papules consistent with seborrheic keratoses on face.  Larger light macules on face consistent with lentigo.  Mild telangiectasia and erythema of nose consistent with mild rosacea.  No evidence of melanoma in accordance of ABCDE criteria. Extremities: No peripheral edema bilaterally.  Capillary refill <2 seconds.         ASSESSMENT/PLAN:   Assessment & Plan Solar lentiginosis Seborrheic keratoses Large macules on face consistent with lentigo.  Small mildly verrucous papules consistent with seborrheic keratoses previously noted.  No evidence of melanoma, no evidence of lentigo mina. - Continue to follow at each visit, reference photos and monitor for changes of skin lesions - Recommended tretinoin 0.025% topically given patient's desire to lighten spots - Strict precautions to avoid sunlight exposure and use sunscreen - Counseled on sensitivity of skin with retinoid Type 2 diabetes mellitus with stage 3a chronic kidney disease, without long-term current use of insulin  (HCC) Given GFR 43-47 in recent months, would stage as CKD 3a. - Increase metformin  XR to 1000 mg daily - Avoid oral NSAIDs - Continue lisinopril  5 mg daily - Already on atorvastatin  40 mg, continue Chronic left shoulder pain - Refilled Voltaren  gel Need for shingles vaccine - Provided paper shingles Rx for pharmacy in hand Encounter for immunization - Influenza vaccine administered today  No follow-ups on file.  Omar Lilley Toma, MD [facility]

## 2024-08-13 NOTE — Assessment & Plan Note (Signed)
Refilled Voltaren gel

## 2024-08-13 NOTE — Patient Instructions (Signed)
 It was great to see you today! Thank you for choosing [facility] for your primary care.  Today we addressed: I do not believe that any of your skin spots are cancer including on your face and your knee.  These are likely due to sun exposure and age.  We will keep a close eye on these over the next few months and years.  Knee pain Please use the Voltaren  gel on your knee a few times a day for the pain.  Solar lentiginosis Please use the tretinoin cream on your face in small amounts.  Place a small spot of cream on any area you wish to make lighter.  Please make sure to use sunscreen while using this cream.  Avoid contact with the eyes.  Need for shingles vaccine Please bring the prescription I am handing you to the pharmacy to discuss your shingles vaccine.  You do need 2 vaccines to complete the series.  You should return to our clinic in 2 months for diabetes follow-up  Thank you for coming to see us  at [facility] and for the opportunity to care for you! Toma, Nylan Nevel, MD 08/13/2024, 9:33 AM

## 2024-09-16 ENCOUNTER — Ambulatory Visit (INDEPENDENT_AMBULATORY_CARE_PROVIDER_SITE_OTHER): Admitting: Family Medicine

## 2024-09-16 ENCOUNTER — Encounter: Payer: Self-pay | Admitting: Family Medicine

## 2024-09-16 VITALS — BP 124/66 | HR 59 | Ht 65.0 in | Wt 177.6 lb

## 2024-09-16 DIAGNOSIS — R519 Headache, unspecified: Secondary | ICD-10-CM | POA: Diagnosis not present

## 2024-09-16 DIAGNOSIS — N184 Chronic kidney disease, stage 4 (severe): Secondary | ICD-10-CM | POA: Diagnosis not present

## 2024-09-16 DIAGNOSIS — G4733 Obstructive sleep apnea (adult) (pediatric): Secondary | ICD-10-CM | POA: Insufficient documentation

## 2024-09-16 NOTE — Telephone Encounter (Signed)
 I called interpreter line [phone] LMVM for them to call back re: phone interpreter [facility] needed for pt and his PAP therapy.    Noted that [employer] did try to call  This order was voided due to no contact with patient.  We made many attempts to contact patient but we received no answer and we were unable to leave a voice mail.

## 2024-09-16 NOTE — Assessment & Plan Note (Signed)
 Repeat BMP today.

## 2024-09-16 NOTE — Assessment & Plan Note (Signed)
 Likely secondary to chronic severe OSA as per sleep study earlier this year via neurology.  Reassuringly negative MRI in March, no indication to repeat MRIs headaches-ongoing since before that time.  There may be a migraine component however, he is not a candidate for NSAIDs or triptans given his complex medical history.  Would best be further discussed and treated by neurology.  Extensively discussed this with patient today in clinic.  He has their phone number in his phone.  He will plan to call them.

## 2024-09-16 NOTE — Telephone Encounter (Signed)
 I called again with [facility] interpreter but no answer and could not LM.

## 2024-09-16 NOTE — Telephone Encounter (Signed)
 Spoke to [place], at [facility].  Sh gave me Gerlene [phone] Karole Nieves language for him to interpret to pt and daughter Sinh Cil.

## 2024-09-16 NOTE — Patient Instructions (Addendum)
 It was great to see you! Thank you for allowing me to participate in your care!  Our plans for today:   VISIT SUMMARY: You visited us  today due to chronic headaches and dizziness that have been affecting you for over a year. We discussed your symptoms, possible causes, and management strategies.  YOUR PLAN: CHRONIC HEADACHES: You have been experiencing daily headaches for over a year, which are light in intensity and occur in various locations on your head. These headaches are sometimes accompanied by blurry vision and are worsened by mental activity. You have been using Tylenol  frequently for relief. -Continue using Tylenol  as needed for headache relief. -We recommend discussing further headache management options with a neurologist.  OBSTRUCTIVE SLEEP APNEA: Your chronic headaches may be linked to obstructive sleep apnea, a condition you have been diagnosed with. -Contact a neurologist to discuss CPAP therapy for your sleep apnea. -Check with your insurance to confirm coverage for CPAP therapy.    Please arrive 15 minutes PRIOR to your next scheduled appointment time! If you do not, this affects OTHER patients' care.  Take care and seek immediate care sooner if you develop any concerns.   Omar Provencal, MD, PGY-3 [facility] 9:22 AM 09/16/2024  [facility]

## 2024-09-16 NOTE — Telephone Encounter (Signed)
 I used Keo, he was not able to connect, three way call with pt.  No answer.  Will try again later.

## 2024-09-16 NOTE — Progress Notes (Signed)
    SUBJECTIVE:   CHIEF COMPLAINT / HPI: headache  Discussed the use of AI scribe software for clinical note transcription with the patient, who gave verbal consent to proceed.  Difficult history as patient speaks sub-dialect of vietnamese that is not available via interpreter. Vietnamese interpreter assisted to some degree in facilitating visit. History of Present Illness Omar Sanders is an 83 year old male who presents with chronic headaches and dizziness.  Chronic headache - Daily headaches for over one year - Headaches persist throughout the day - Pain is light in intensity and occurs in various locations on the head - No associated nausea or vomiting - Headaches sometimes occur at night and can affect sleep, though overall sleep quality is generally good - Mental activity exacerbates headache symptoms; improvement noted when not mentally busy - Frequently uses Tylenol  for symptom relief, with concern about long-term use - Blurry vision accompanies headaches - Chronic dizziness present - Previously evaluated by neurology for sleep apnea with diagnosis of severe OSA - Headaches thought to be because of this - Normal MRI brain in March 2025 - Lost to follow-up with neurology - Patient brings up insurance issue but unclear what the issue was. - No obvious reason for him being unable to obtain CPAP based on review of neurology correspondences    PERTINENT  PMH / PSH: HTN, AA, GERD, IBS, HLD, T2DM, Peripheral Neuropathy, CKD, BPH  OBJECTIVE:   BP 124/66   Pulse (!) 59   Ht 5' 5 (1.651 m)   Wt 177 lb 9.6 oz (80.6 kg)   SpO2 96%   BMI 29.55 kg/m   Physical Exam General: NAD, well appearing Neuro: A&O, PERRL, EOMI intact, no gross focal deficits Cardiovascular: RRR, no murmurs Respiratory: normal WOB on RA, CTAB, no wheezes, ronchi or rales Abdomen: soft, NTTP, no rebound or guarding Extremities: Moving all 4 extremities equally, no peripheral edema   ASSESSMENT/PLAN:    Assessment & Plan Nonintractable episodic headache, unspecified headache type OSA (obstructive sleep apnea) Likely secondary to chronic severe OSA as per sleep study earlier this year via neurology.  Reassuringly negative MRI in March, no indication to repeat MRIs headaches-ongoing since before that time.  There may be a migraine component however, he is not a candidate for NSAIDs or triptans given his complex medical history.  Would best be further discussed and treated by neurology.  Extensively discussed this with patient today in clinic.  He has their phone number in his phone.  He will plan to call them. CKD (chronic kidney disease), stage IV (HCC) Repeat BMP today.   Return in about 4 weeks (around 10/14/2024) for f/u headaches, back pain.  Ozell Provencal, MD, PGY-3 [facility] 9:59 AM 09/16/2024  [facility]

## 2024-09-16 NOTE — Telephone Encounter (Signed)
 I called daughter Sinh Cil [phone], no answer.

## 2024-09-17 LAB — RENAL FUNCTION PANEL
Albumin: 3.8 g/dL (ref 3.7–4.7)
BUN/Creatinine Ratio: 13 (ref 10–24)
BUN: 20 mg/dL (ref 8–27)
CO2: 24 mmol/L (ref 20–29)
Calcium: 8.8 mg/dL (ref 8.6–10.2)
Chloride: 102 mmol/L (ref 96–106)
Creatinine, Ser: 1.57 mg/dL — ABNORMAL HIGH (ref 0.76–1.27)
Glucose: 207 mg/dL — ABNORMAL HIGH (ref 70–99)
Phosphorus: 2.4 mg/dL — ABNORMAL LOW (ref 2.8–4.1)
Potassium: 3.5 mmol/L (ref 3.5–5.2)
Sodium: 140 mmol/L (ref 134–144)
eGFR: 43 mL/min/1.73 — ABNORMAL LOW (ref >59)

## 2024-09-17 NOTE — Telephone Encounter (Signed)
 I called and after multiple attempts to pt and trying conference calls did finally connect.  30 minutes spent with pt and interpreter Keo from [facility] [phone].  Explained the process that DME speaks to insurance gets authorization on machine and supplies, then calls pt with this information.  Once done proceeds to order and see pt in office once available to give machine, supplies how to use, clean, fit for mask.  We see back in 2-3 months after using machine.  Call for appt when gets machine.  He had received letter in this regard as well.  I sent to [employer] to reprocess order for pt with severe osa.  They will use there protocol in interpreting information to pt.  I did relay pts request to use certain # that I listed for [employer].  They will use there protocol.  Pt to call us  back if questions, concerns.      RE: pap therapy  he has severe OSA Received: Today New, [name], RN; [name] Hello,  Sent back in STAT again for re-review. We have an interpretation svs. but Thank you for the additional info.  Thank you,  Brad New     Previous Messages    ----- Message ----- From: [name], RN Sent: 09/17/2024  12:30 PM EST To: Adine New Subject: FW: pap therapy  he has severe OSA            Please start process again.  There is a BIG language barrier.  Vietnamese language per pt.   He relayed that there are 2 interpreters # that he gave me if you want to use [phone] (male) or [phone] [name] (male).  I have not used them I went thru [facility].  Of course use your protocol relating to getting the message across.  Good luck.    [name] ----- Message ----- From: [name] Sent: 09/16/2024  11:47 AM EST To: [name] Neysa, RN Subject: RE: pap therapy                                He should be okay to process, however he would need to be ran through intake process to verify. ----- Message ----- From: [name], RN Sent:  09/16/2024  11:42 AM EST To: Adine New Subject: FW: pap therapy                                If I can reach pt , would he be eligible to move forward? Or need to start over?  Particia Peak ----- Message ----- From: [name] Sent: 09/16/2024  11:33 AM EST To: Adine Joylene; Ephraim Dollar; Melissa Ziegler* Subject: RE: pap therapy                                Hello Nena.  No, we never did.  This order was voided due to no contact with patient.  We made many attempts to contact patient but we received no answer and we were unable to leave a voice mail.  Thank you,  Brad New ----- Message ----- From: [name], RN Sent: 09/16/2024  11:29 AM EST To: Adine Randolm Ephraim Dollar; Melissa Ziegler* Subject: pap therapy  Good morning  Were you able to get in touch with pt about pap therapy?  Omar Sanders Male, 83 y.o., 1941-07-08 MRN: [id number] Phone: [phone] (M) Needs Interpreter: Karole Nieves  [place]

## 2024-09-17 NOTE — Telephone Encounter (Signed)
 Burnard from interpreter line said Ferguson, RN can call her directly and she will reach out to pt. Burnard can be reached at [phone]

## 2024-09-17 NOTE — Telephone Encounter (Addendum)
 Pt called I spoke to pt and tried to explain to him what he needs to do about machine, but due to language barrier did not understand.  I attempted via multiple times and also tried to connect with [facility] interpreter to relay to pt information about DME and next steps.  Was not able to connect today.

## 2024-09-19 ENCOUNTER — Telehealth: Payer: Self-pay

## 2024-09-19 ENCOUNTER — Ambulatory Visit: Payer: Self-pay | Admitting: Family Medicine

## 2024-09-19 NOTE — Telephone Encounter (Signed)
 Called patient to provide update per Dr. Toma.   Patient did not answer, unable to LVM . Will attempt to reach patient at later time.   Chiquita JAYSON English, RN

## 2024-09-19 NOTE — Telephone Encounter (Signed)
 Patient calls nurse line regarding diclofenac  1% gel.   He reports that insurance is not covering this. Rx is OTC and therefore, insurance will not cover.   Patient is requesting that provider send in alternative for his pain as he is unable to pay out of pocket for OTC.   Will forward request to prescriber.   Chiquita JAYSON English, RN

## 2024-09-30 ENCOUNTER — Telehealth: Payer: Self-pay | Admitting: *Deleted

## 2024-09-30 NOTE — Telephone Encounter (Signed)
 Spoke to patient. Patient has broken english unable to understand  all what patient wanted . Pt states something about insurance company and cpap machine . Called interpreter line to assist . Interpreter left message  to call our office back . Did call daughter on DPR she didn't understand what I was asking . Daughter kept saying she was at work . Did send message to patient DME to see if they know what is going on about insurance and cpap machine

## 2024-10-01 ENCOUNTER — Telehealth: Payer: Self-pay | Admitting: Neurology

## 2024-10-01 NOTE — Telephone Encounter (Signed)
 Pt has called x2 with questions about his CPAP and or CPAP supplies and what  category  - something about his insurance denying. I did tell the patient that I was calling interpreter services to help understand his needs but he would not answer their calls. We tried multiple times.

## 2024-10-07 ENCOUNTER — Telehealth: Payer: Self-pay | Admitting: *Deleted

## 2024-10-07 NOTE — Telephone Encounter (Addendum)
 [employer]) calling to get more information on CPAP machine for the patient. Patient was confused and did not understand.  Request prescription to proof patient needs machine, brand and type. Can fax to: [phone], contact information: [phone]

## 2024-10-07 NOTE — Telephone Encounter (Addendum)
 Received call from phone room about pt and spoke to cecile interpreter for [employer]. 18 min call.  Explained process for getting machine (gave her the info for the bipap resmed) [employer]  tele# she was not sure if he did not reach out to [employer] or if he answered his phone from there office.  I relayed to her that I spoke to pt/ interpreter/ was 3 way call several months ago about machine.  (Pt/interpreter and myself). Interpreter was going to relay to pt.

## 2024-10-07 NOTE — Telephone Encounter (Signed)
 Please read phone note from today

## 2024-10-23 NOTE — Progress Notes (Unsigned)
 " Cardiology Office Note:  .   Date:  10/24/2024  ID:  Omar Sanders, DOB 1941-01-29, MRN [id number] PCP: Alba Sharper, MD  [facility] Providers Cardiologist:  Darryle ONEIDA Decent, MD   History of Present Illness: .    Chief Complaint  Patient presents with   Follow-up    Omar Sanders is a 83 y.o. male with below history who presents for follow-up.   History of Present Illness   Omar Sanders is an 83 year old male with hypertension, coronary artery disease, and chronic kidney disease 3b who presents for follow-up.  He experiences chest pain, described as a 'normal, regular pain,' which is alleviated by Voltaren  gel. There are no sharp or dull characteristics to the pain. No dyspnea or peripheral edema is reported.  He has a history of hypertension and is taking lisinopril  5 mg daily.  He has a history of coronary artery disease with non-obstructive findings on a catheterization 15 years ago. He continues to take Lipitor 40 mg daily for hyperlipidemia, with a recent LDL level of 60.  Chronic kidney disease stage 3B is stable. He mentions his kidneys are 'so weak,' but there are no symptoms of fluid overload or peripheral edema.  He has a history of aortic dilation and reports no new symptoms related to this condition.  He has a history of right bundle branch block, and first-degree AV block, with no symptoms related to these conditions.           Problem List 1. 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 HTN 4. Dilated Aorta -4.0 cm likely normal for age seen on CT 2019 -43 mm 09/2023 5. HLD -T chol 123, LDL 60, HDL 27, TG 216 6. CAD -non-obstructive CAD 2010 -LHC 2010: 50% mLAD; 40% mRCA -normal MPI 09/16/2020 7. DM -A1c 7.1 8. CKD 3b -eGFR 43    ROS: All other ROS reviewed and negative. Pertinent positives noted in the HPI.     Studies Reviewed: SABRA   EKG Interpretation Date/Time:  Friday October 24 2024 10:33:59 EST Ventricular Rate:  57 PR Interval:  304 QRS  Duration:  140 QT Interval:  472 QTC Calculation: 459 R Axis:   51  Text Interpretation: Sinus bradycardia with 1st degree A-V block Right bundle branch block Confirmed by Decent Darryle [phone]) on 10/24/2024 10:37:25 AM   TTE 12/22/2021  1. Left ventricular ejection fraction, by estimation, is 60 to 65%. The  left ventricle has normal function. The left ventricle has no regional  wall motion abnormalities. Left ventricular diastolic parameters are  consistent with Grade I diastolic  dysfunction (impaired relaxation).   2. Right ventricular systolic function is normal. The right ventricular  size is normal. There is normal pulmonary artery systolic pressure.   3. The mitral valve is normal in structure. Mild mitral valve  regurgitation. No evidence of mitral stenosis.   4. Tricuspid valve regurgitation is mild to moderate.   5. The aortic valve is normal in structure. Aortic valve regurgitation is  mild. No aortic stenosis is present.   6. Aneurysm of the ascending aorta, measuring 45 mm.   7. The inferior vena cava is normal in size with greater than 50%  respiratory variability, suggesting right atrial pressure of 3 mmHg.    Physical Exam:   VS:  BP (!) 104/52 (BP Location: Left Arm, Patient Position: Sitting, Cuff Size: Normal)   Pulse (!) 57   Ht 5' 5 (1.651 m)   Wt 173  lb 9.6 oz (78.7 kg)   SpO2 96%   BMI 28.89 kg/m    Wt Readings from Last 3 Encounters:  10/24/24 173 lb 9.6 oz (78.7 kg)  09/16/24 177 lb 9.6 oz (80.6 kg)  08/13/24 176 lb (79.8 kg)    GEN: Well nourished, well developed in no acute distress NECK: No JVD; No carotid bruits CARDIAC: RRR, no murmurs, rubs, gallops RESPIRATORY:  Clear to auscultation without rales, wheezing or rhonchi  ABDOMEN: Soft, non-tender, non-distended EXTREMITIES:  No edema; No deformity  ASSESSMENT AND PLAN: .   Assessment and Plan    Chronic non-cardiac chest pain Chronic chest discomfort alleviated with Voltaren  gel,  indicating non-cardiac origin. - Continue Voltaren  gel for chest pain.  Aortic dilation Aortic aneurysm with previous measurement of 43 mm on CT. No acute changes noted. - Ordered CT chest without contrast for aortic dilation.  Non-obstructive coronary artery disease Coronary calcium  seen on chest CT. Non-obstructive coronary artery disease with no current symptoms. - Continue Lipitor 40 mg daily.  Primary hypertension Blood pressure well controlled on current regimen of lisinopril  5 mg daily. - Continue lisinopril  5 mg daily.  Mixed hyperlipidemia LDL level at 60 mg/dL. Managed with Lipitor. - Continue Lipitor 40 mg daily.  Chronic kidney disease, stage 3B CKD stage 3B. Fluid levels appear stable. - Ordered CT chest without contrast due to CKD stage 3B.  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 No symptoms reported. Advised to avoid AV nodal agents. - Avoid AV nodal agents.                Follow-up: Return in about 1 year (around 10/24/2025).  Signed, Darryle DASEN. Barbaraann, MD, [facility]  [facility]  [address] [place], [place] [address] [phone]  10:57 AM   "

## 2024-10-24 ENCOUNTER — Ambulatory Visit: Attending: Cardiovascular Disease | Admitting: Cardiovascular Disease

## 2024-10-24 ENCOUNTER — Encounter: Payer: Self-pay | Admitting: Cardiovascular Disease

## 2024-10-24 VITALS — BP 104/52 | HR 57 | Ht 65.0 in | Wt 173.6 lb

## 2024-10-24 DIAGNOSIS — G8929 Other chronic pain: Secondary | ICD-10-CM

## 2024-10-24 DIAGNOSIS — I4510 Unspecified right bundle-branch block: Secondary | ICD-10-CM | POA: Diagnosis not present

## 2024-10-24 DIAGNOSIS — E782 Mixed hyperlipidemia: Secondary | ICD-10-CM

## 2024-10-24 DIAGNOSIS — I10 Essential (primary) hypertension: Secondary | ICD-10-CM

## 2024-10-24 DIAGNOSIS — I440 Atrioventricular block, first degree: Secondary | ICD-10-CM | POA: Diagnosis not present

## 2024-10-24 DIAGNOSIS — R079 Chest pain, unspecified: Secondary | ICD-10-CM | POA: Diagnosis not present

## 2024-10-24 DIAGNOSIS — I2510 Atherosclerotic heart disease of native coronary artery without angina pectoris: Secondary | ICD-10-CM

## 2024-10-24 DIAGNOSIS — I77819 Aortic ectasia, unspecified site: Secondary | ICD-10-CM | POA: Diagnosis not present

## 2024-10-24 DIAGNOSIS — R42 Dizziness and giddiness: Secondary | ICD-10-CM

## 2024-10-24 NOTE — Patient Instructions (Signed)
 Medication Instructions:  Your physician recommends that you continue on your current medications as directed. Please refer to the Current Medication list given to you today.  *If you need a refill on your cardiac medications before your next appointment, please call your pharmacy*  Lab Work: NONE  If you have labs (blood work) drawn today and your tests are completely normal, you will receive your results only by: MyChart Message (if you have MyChart) OR A paper copy in the mail If you have any lab test that is abnormal or we need to change your treatment, we will call you to review the results.  Testing/Procedures: Your Physician has requested that you have a CT Chest without Contrast.   Follow-Up: At [facility], you and your health needs are our priority.  As part of our continuing mission to provide you with exceptional heart care, our providers are all part of one team.  This team includes your primary Cardiologist (physician) and Advanced Practice Providers or APPs (Physician Assistants and Nurse Practitioners) who all work together to provide you with the care you need, when you need it.  Your next appointment:   1 year(s)  Provider:   One of our Advanced Practice Providers (APPs): Morse Clause, PA-C  Lamarr Satterfield, NP Miriam Shams, NP  Olivia Pavy, PA-C Josefa Beauvais, NP  Leontine Salen, PA-C Orren Fabry, PA-C  [place], PA-C Ernest Dick, NP  Damien Braver, NP Jon Hails, PA-C  Waddell Donath, PA-C    Dayna Dunn, PA-C  Scott Weaver, PA-C Lum Louis, NP Katlyn West, NP Callie Goodrich, PA-C  Xika Zhao, NP Sheng Haley, PA-C    Kathleen Johnson, PA-C     We recommend signing up for the patient portal called MyChart.  Sign up information is provided on this After Visit Summary.  MyChart is used to connect with patients for Virtual Visits (Telemedicine).  Patients are able to view lab/test results, encounter notes, upcoming appointments, etc.   Non-urgent messages can be sent to your provider as well.   To learn more about what you can do with MyChart, go to [email or web address].

## 2024-10-27 NOTE — Telephone Encounter (Signed)
 SABRA

## 2024-10-27 NOTE — Telephone Encounter (Signed)
 Unfortunately, based off the download there is not much I can suggest other than taking the machine and mask in to the DME company as the leak is very high.  But I do not recommend any pressure changes at this point just trying to fix the leak should be first and foremost.  I recommend he make an appointment with his DME provider.

## 2024-10-27 NOTE — Telephone Encounter (Signed)
 I called and spoke to Omar Sanders, then to DME.  Made appt 10-31-2024 at 1100, Omar Sanders verbalized understanding.  If he needs appt he will call [phone] give  punch in by phone dob, then opt 9 to speak to [facility] to change appt.

## 2024-10-27 NOTE — Telephone Encounter (Signed)
 Pt called  stating that when he got machine he was doing fine ,Now for the past days he been having some slight problems with Machine the patient stated  that  he hears some ringing  in his ears  and the pressure may be to high . Pt stated he would like to speak to Nurse  about concerns

## 2024-10-29 ENCOUNTER — Ambulatory Visit (HOSPITAL_COMMUNITY)
Admission: RE | Admit: 2024-10-29 | Discharge: 2024-10-29 | Disposition: A | Discharge status: Home / Self Care | Source: Ambulatory Visit | Attending: Cardiovascular Disease | Admitting: Cardiovascular Disease

## 2024-10-29 DIAGNOSIS — I77819 Aortic ectasia, unspecified site: Secondary | ICD-10-CM | POA: Insufficient documentation

## 2024-10-29 DIAGNOSIS — I2510 Atherosclerotic heart disease of native coronary artery without angina pectoris: Secondary | ICD-10-CM | POA: Diagnosis present

## 2024-11-09 ENCOUNTER — Other Ambulatory Visit: Payer: Self-pay | Admitting: Cardiovascular Disease

## 2024-11-09 DIAGNOSIS — I152 Hypertension secondary to endocrine disorders: Secondary | ICD-10-CM

## 2024-11-10 ENCOUNTER — Ambulatory Visit: Payer: Self-pay | Admitting: Cardiovascular Disease

## 2024-11-11 ENCOUNTER — Telehealth (INDEPENDENT_AMBULATORY_CARE_PROVIDER_SITE_OTHER): Payer: Self-pay | Admitting: Otolaryngology

## 2024-11-11 MED ORDER — OFLOXACIN 0.3 % OT SOLN: 5.0000 [drp] | Freq: Two times a day (BID) | OTIC | 1 refills | Status: AC

## 2024-11-11 NOTE — Telephone Encounter (Signed)
 The patient is requesting ofloxacin  refilled for his ears please.  He uses  the [facility] [address] [place], [place].

## 2024-11-11 NOTE — Telephone Encounter (Signed)
Refilled ofloxacin

## 2024-11-17 ENCOUNTER — Encounter: Payer: Self-pay | Admitting: Pharmacist

## 2024-11-17 NOTE — Progress Notes (Signed)
 This patient is appearing on a report for being at risk of failing the adherence measure for hypertension (ACEi/ARB) medications this calendar year.   Medication: lisinopril  Last fill date: 09/30/24 for 90 day supply  Reviewed medication indication, dosing, and goals of therapy.

## 2024-12-01 ENCOUNTER — Other Ambulatory Visit: Payer: Self-pay | Admitting: Family Medicine

## 2024-12-01 DIAGNOSIS — I152 Hypertension secondary to endocrine disorders: Secondary | ICD-10-CM

## 2024-12-03 ENCOUNTER — Telehealth (INDEPENDENT_AMBULATORY_CARE_PROVIDER_SITE_OTHER): Payer: Self-pay | Admitting: Otolaryngology

## 2024-12-03 NOTE — Telephone Encounter (Signed)
 The patient is requesting refill on medication betamethasone  dipropionate 0.05 cream sent to [facility] on [address] please.

## 2024-12-05 NOTE — Telephone Encounter (Signed)
 Called pharmacy to call in prescription that Dr. Tobie asked to be called in. Pharmacy stated that they had it and would let the patient know when it was ready.
